# Patient Record
Sex: Male | Born: 1960 | Race: Black or African American | Hispanic: No | Marital: Married | Smoking: Former smoker
Health system: Southern US, Community
[De-identification: ages and names within clinical notes are randomized; demographics above are authoritative.]

## PROBLEM LIST (undated history)

## (undated) DIAGNOSIS — E785 Hyperlipidemia, unspecified: Secondary | ICD-10-CM

## (undated) DIAGNOSIS — K259 Gastric ulcer, unspecified as acute or chronic, without hemorrhage or perforation: Secondary | ICD-10-CM

## (undated) DIAGNOSIS — E669 Obesity, unspecified: Secondary | ICD-10-CM

## (undated) DIAGNOSIS — I739 Peripheral vascular disease, unspecified: Secondary | ICD-10-CM

## (undated) DIAGNOSIS — K7460 Unspecified cirrhosis of liver: Secondary | ICD-10-CM

## (undated) DIAGNOSIS — B353 Tinea pedis: Secondary | ICD-10-CM

## (undated) DIAGNOSIS — R188 Other ascites: Secondary | ICD-10-CM

## (undated) DIAGNOSIS — I10 Essential (primary) hypertension: Secondary | ICD-10-CM

## (undated) DIAGNOSIS — Z86010 Personal history of colonic polyps: Secondary | ICD-10-CM

## (undated) HISTORY — PX: POLYPECTOMY: SHX149

## (undated) HISTORY — DX: Obesity, unspecified: E66.9

## (undated) HISTORY — DX: Personal history of colonic polyps: Z86.010

## (undated) HISTORY — DX: Unspecified cirrhosis of liver: K74.60

## (undated) HISTORY — DX: Peripheral vascular disease, unspecified: I73.9

## (undated) HISTORY — DX: Tinea pedis: B35.3

## (undated) HISTORY — DX: Hyperlipidemia, unspecified: E78.5

## (undated) HISTORY — PX: COLONOSCOPY: SHX174

## (undated) HISTORY — DX: Other ascites: R18.8

## (undated) HISTORY — DX: Essential (primary) hypertension: I10

---
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
## 1997-01-18 HISTORY — PX: UMBILICAL HERNIA REPAIR: SHX196

## 2001-07-29 ENCOUNTER — Encounter: Payer: Self-pay | Admitting: Family Medicine

## 2001-07-29 ENCOUNTER — Encounter: Admission: RE | Admit: 2001-07-29 | Discharge: 2001-07-29 | Payer: Self-pay | Admitting: Family Medicine

## 2002-07-19 HISTORY — PX: VASECTOMY: SHX75

## 2004-05-24 ENCOUNTER — Ambulatory Visit: Payer: Self-pay | Admitting: Family Medicine

## 2004-06-06 ENCOUNTER — Ambulatory Visit: Payer: Self-pay | Admitting: Family Medicine

## 2005-06-05 ENCOUNTER — Ambulatory Visit: Payer: Self-pay | Admitting: Family Medicine

## 2005-06-20 HISTORY — PX: OTHER SURGICAL HISTORY: SHX169

## 2006-01-22 ENCOUNTER — Ambulatory Visit: Payer: Self-pay | Admitting: Family Medicine

## 2006-02-03 ENCOUNTER — Encounter: Admission: RE | Admit: 2006-02-03 | Discharge: 2006-02-03 | Payer: Self-pay | Admitting: Family Medicine

## 2006-02-10 ENCOUNTER — Ambulatory Visit: Payer: Self-pay | Admitting: Family Medicine

## 2006-02-13 ENCOUNTER — Ambulatory Visit: Payer: Self-pay | Admitting: Family Medicine

## 2006-03-19 ENCOUNTER — Encounter: Admission: RE | Admit: 2006-03-19 | Discharge: 2006-06-17 | Payer: Self-pay | Admitting: Family Medicine

## 2006-03-24 ENCOUNTER — Ambulatory Visit: Payer: Self-pay | Admitting: Family Medicine

## 2006-06-26 ENCOUNTER — Ambulatory Visit: Payer: Self-pay | Admitting: Family Medicine

## 2006-06-26 LAB — CONVERTED CEMR LAB
ALT: 52 units/L — ABNORMAL HIGH (ref 0–40)
AST: 39 units/L — ABNORMAL HIGH (ref 0–37)
Direct LDL: 175.5 mg/dL
Hgb A1c MFr Bld: 6.2 %
Hgb A1c MFr Bld: 6.2 % — ABNORMAL HIGH (ref 4.6–6.0)
Triglycerides: 98 mg/dL (ref 0–149)

## 2006-08-13 ENCOUNTER — Encounter: Payer: Self-pay | Admitting: Family Medicine

## 2006-08-13 DIAGNOSIS — E119 Type 2 diabetes mellitus without complications: Secondary | ICD-10-CM | POA: Insufficient documentation

## 2006-08-13 DIAGNOSIS — K760 Fatty (change of) liver, not elsewhere classified: Secondary | ICD-10-CM

## 2006-08-13 DIAGNOSIS — E1169 Type 2 diabetes mellitus with other specified complication: Secondary | ICD-10-CM | POA: Insufficient documentation

## 2006-08-13 DIAGNOSIS — E785 Hyperlipidemia, unspecified: Secondary | ICD-10-CM

## 2006-08-13 DIAGNOSIS — E1165 Type 2 diabetes mellitus with hyperglycemia: Secondary | ICD-10-CM

## 2006-08-13 DIAGNOSIS — Z87891 Personal history of nicotine dependence: Secondary | ICD-10-CM | POA: Insufficient documentation

## 2006-08-13 DIAGNOSIS — T7840XA Allergy, unspecified, initial encounter: Secondary | ICD-10-CM | POA: Insufficient documentation

## 2006-08-19 ENCOUNTER — Ambulatory Visit: Payer: Self-pay | Admitting: Family Medicine

## 2007-07-16 ENCOUNTER — Ambulatory Visit: Payer: Self-pay | Admitting: Family Medicine

## 2007-07-16 DIAGNOSIS — B353 Tinea pedis: Secondary | ICD-10-CM | POA: Insufficient documentation

## 2007-09-08 ENCOUNTER — Ambulatory Visit: Payer: Self-pay | Admitting: Family Medicine

## 2007-09-08 DIAGNOSIS — E663 Overweight: Secondary | ICD-10-CM

## 2007-09-09 LAB — CONVERTED CEMR LAB
AST: 29 units/L (ref 0–37)
Albumin: 4.3 g/dL (ref 3.5–5.2)
Basophils Absolute: 0 10*3/uL (ref 0.0–0.1)
Basophils Relative: 0.1 % (ref 0.0–1.0)
CO2: 29 meq/L (ref 19–32)
Calcium: 9.6 mg/dL (ref 8.4–10.5)
Cholesterol: 240 mg/dL (ref 0–200)
Direct LDL: 162.2 mg/dL
GFR calc non Af Amer: 85 mL/min
Glucose, Bld: 97 mg/dL (ref 70–99)
HCT: 47 % (ref 39.0–52.0)
Hemoglobin: 16 g/dL (ref 13.0–17.0)
Hgb A1c MFr Bld: 6.4 % — ABNORMAL HIGH (ref 4.6–6.0)
Lymphocytes Relative: 41.6 % (ref 12.0–46.0)
MCHC: 34.1 g/dL (ref 30.0–36.0)
MCV: 99.1 fL (ref 78.0–100.0)
Monocytes Absolute: 0.5 10*3/uL (ref 0.1–1.0)
Neutro Abs: 2.5 10*3/uL (ref 1.4–7.7)
Phosphorus: 4.1 mg/dL (ref 2.3–4.6)
Potassium: 4.3 meq/L (ref 3.5–5.1)
RBC: 4.74 M/uL (ref 4.22–5.81)
RDW: 13.3 % (ref 11.5–14.6)
Sodium: 141 meq/L (ref 135–145)
Total Bilirubin: 0.6 mg/dL (ref 0.3–1.2)

## 2007-10-21 ENCOUNTER — Ambulatory Visit: Payer: Self-pay | Admitting: Family Medicine

## 2007-11-17 ENCOUNTER — Encounter: Admission: RE | Admit: 2007-11-17 | Discharge: 2007-11-17 | Payer: Self-pay | Admitting: Family Medicine

## 2007-11-17 ENCOUNTER — Encounter (INDEPENDENT_AMBULATORY_CARE_PROVIDER_SITE_OTHER): Payer: Self-pay | Admitting: Family Medicine

## 2007-12-02 ENCOUNTER — Ambulatory Visit: Payer: Self-pay | Admitting: Family Medicine

## 2007-12-09 LAB — CONVERTED CEMR LAB
ALT: 47 units/L (ref 0–53)
AST: 36 units/L (ref 0–37)
Triglycerides: 114 mg/dL (ref 0–149)

## 2008-01-21 ENCOUNTER — Ambulatory Visit: Payer: Self-pay | Admitting: Family Medicine

## 2008-01-23 LAB — CONVERTED CEMR LAB
ALT: 52 units/L (ref 0–53)
BUN: 12 mg/dL (ref 6–23)
Bilirubin, Direct: 0.1 mg/dL (ref 0.0–0.3)
CO2: 28 meq/L (ref 19–32)
Chloride: 108 meq/L (ref 96–112)
Glucose, Bld: 124 mg/dL — ABNORMAL HIGH (ref 70–99)
Hgb A1c MFr Bld: 6.2 % — ABNORMAL HIGH (ref 4.6–6.0)
LDL Cholesterol: 107 mg/dL — ABNORMAL HIGH (ref 0–99)
Potassium: 4.1 meq/L (ref 3.5–5.1)
Total Bilirubin: 0.6 mg/dL (ref 0.3–1.2)
Total CHOL/HDL Ratio: 4.3
VLDL: 22 mg/dL (ref 0–40)

## 2008-02-01 ENCOUNTER — Emergency Department (HOSPITAL_COMMUNITY): Admission: EM | Admit: 2008-02-01 | Discharge: 2008-02-01 | Payer: Self-pay | Admitting: *Deleted

## 2008-02-03 ENCOUNTER — Ambulatory Visit: Payer: Self-pay | Admitting: Family Medicine

## 2008-02-10 ENCOUNTER — Ambulatory Visit: Payer: Self-pay

## 2008-02-10 ENCOUNTER — Encounter: Payer: Self-pay | Admitting: Family Medicine

## 2008-02-23 ENCOUNTER — Ambulatory Visit: Payer: Self-pay | Admitting: Internal Medicine

## 2008-04-05 ENCOUNTER — Encounter (INDEPENDENT_AMBULATORY_CARE_PROVIDER_SITE_OTHER): Payer: Self-pay | Admitting: *Deleted

## 2008-04-29 ENCOUNTER — Ambulatory Visit: Payer: Self-pay | Admitting: Family Medicine

## 2008-05-26 ENCOUNTER — Encounter (INDEPENDENT_AMBULATORY_CARE_PROVIDER_SITE_OTHER): Payer: Self-pay | Admitting: *Deleted

## 2008-06-10 ENCOUNTER — Ambulatory Visit: Payer: Self-pay | Admitting: Family Medicine

## 2008-06-13 LAB — CONVERTED CEMR LAB
Alkaline Phosphatase: 91 units/L (ref 39–117)
Basophils Absolute: 0 10*3/uL (ref 0.0–0.1)
Bilirubin, Direct: 0.1 mg/dL (ref 0.0–0.3)
CO2: 29 meq/L (ref 19–32)
Calcium: 9.6 mg/dL (ref 8.4–10.5)
Cholesterol: 235 mg/dL (ref 0–200)
Direct LDL: 173.8 mg/dL
GFR calc Af Amer: 116 mL/min
Glucose, Bld: 120 mg/dL — ABNORMAL HIGH (ref 70–99)
Lymphocytes Relative: 34.9 % (ref 12.0–46.0)
MCHC: 35.3 g/dL (ref 30.0–36.0)
Monocytes Absolute: 0.7 10*3/uL (ref 0.1–1.0)
Monocytes Relative: 11.7 % (ref 3.0–12.0)
PSA: 0.41 ng/mL (ref 0.10–4.00)
Platelets: 228 10*3/uL (ref 150–400)
Potassium: 4.5 meq/L (ref 3.5–5.1)
RDW: 13.3 % (ref 11.5–14.6)
Sodium: 140 meq/L (ref 135–145)
Total Bilirubin: 0.8 mg/dL (ref 0.3–1.2)
Total CHOL/HDL Ratio: 5.1
Total Protein: 7.3 g/dL (ref 6.0–8.3)
Triglycerides: 84 mg/dL (ref 0–149)
VLDL: 17 mg/dL (ref 0–40)

## 2008-06-16 ENCOUNTER — Ambulatory Visit: Payer: Self-pay | Admitting: Family Medicine

## 2008-06-23 ENCOUNTER — Encounter: Payer: Self-pay | Admitting: Family Medicine

## 2008-08-11 ENCOUNTER — Encounter (INDEPENDENT_AMBULATORY_CARE_PROVIDER_SITE_OTHER): Payer: Self-pay | Admitting: *Deleted

## 2008-09-13 ENCOUNTER — Ambulatory Visit: Payer: Self-pay | Admitting: Family Medicine

## 2008-09-13 DIAGNOSIS — IMO0002 Reserved for concepts with insufficient information to code with codable children: Secondary | ICD-10-CM

## 2008-09-26 ENCOUNTER — Encounter (INDEPENDENT_AMBULATORY_CARE_PROVIDER_SITE_OTHER): Payer: Self-pay | Admitting: *Deleted

## 2008-09-26 LAB — CONVERTED CEMR LAB
ALT: 61 units/L — ABNORMAL HIGH (ref 0–53)
Cholesterol: 253 mg/dL — ABNORMAL HIGH (ref 0–200)
HDL: 47.2 mg/dL (ref >39.00)
Triglycerides: 126 mg/dL (ref 0.0–149.0)

## 2009-08-07 ENCOUNTER — Telehealth: Payer: Self-pay | Admitting: Family Medicine

## 2009-08-08 ENCOUNTER — Ambulatory Visit: Payer: Self-pay | Admitting: Family Medicine

## 2009-08-08 LAB — CONVERTED CEMR LAB
Cholesterol, target level: 200 mg/dL
HDL goal, serum: 40 mg/dL
LDL Goal: 100 mg/dL

## 2009-09-19 ENCOUNTER — Ambulatory Visit: Payer: Self-pay | Admitting: Family Medicine

## 2009-09-19 LAB — CONVERTED CEMR LAB
Albumin: 4.3 g/dL (ref 3.5–5.2)
Basophils Absolute: 0.1 10*3/uL (ref 0.0–0.1)
CO2: 26 meq/L (ref 19–32)
Calcium: 9.6 mg/dL (ref 8.4–10.5)
Chloride: 105 meq/L (ref 96–112)
Cholesterol: 183 mg/dL (ref 0–200)
Creatinine,U: 35.7 mg/dL
Eosinophils Relative: 7.5 % — ABNORMAL HIGH (ref 0.0–5.0)
Glucose, Bld: 142 mg/dL — ABNORMAL HIGH (ref 70–99)
HCT: 42.5 % (ref 39.0–52.0)
HDL: 42.2 mg/dL (ref >39.00)
Hgb A1c MFr Bld: 7.5 % — ABNORMAL HIGH (ref 4.6–6.5)
Lymphs Abs: 2.1 10*3/uL (ref 0.7–4.0)
Microalb, Ur: 5.9 mg/dL — ABNORMAL HIGH (ref 0.0–1.9)
Monocytes Absolute: 0.7 10*3/uL (ref 0.1–1.0)
Monocytes Relative: 10.9 % (ref 3.0–12.0)
Neutrophils Relative %: 50 % (ref 43.0–77.0)
Platelets: 278 10*3/uL (ref 150.0–400.0)
Potassium: 4.4 meq/L (ref 3.5–5.1)
RDW: 14 % (ref 11.5–14.6)
Sodium: 143 meq/L (ref 135–145)
TSH: 2.22 microintl units/mL (ref 0.35–5.50)
Total CHOL/HDL Ratio: 4
Triglycerides: 95 mg/dL (ref 0.0–149.0)
WBC: 6.7 10*3/uL (ref 4.5–10.5)

## 2009-09-21 ENCOUNTER — Ambulatory Visit: Payer: Self-pay | Admitting: Family Medicine

## 2009-09-21 ENCOUNTER — Encounter (INDEPENDENT_AMBULATORY_CARE_PROVIDER_SITE_OTHER): Payer: Self-pay | Admitting: *Deleted

## 2009-11-15 ENCOUNTER — Encounter: Payer: Self-pay | Admitting: Family Medicine

## 2009-11-15 ENCOUNTER — Encounter: Admission: RE | Admit: 2009-11-15 | Discharge: 2010-02-13 | Payer: Self-pay | Admitting: Family Medicine

## 2009-12-28 ENCOUNTER — Encounter: Payer: Self-pay | Admitting: Family Medicine

## 2010-01-02 ENCOUNTER — Ambulatory Visit: Payer: Self-pay | Admitting: Family Medicine

## 2010-01-02 ENCOUNTER — Telehealth (INDEPENDENT_AMBULATORY_CARE_PROVIDER_SITE_OTHER): Payer: Self-pay | Admitting: *Deleted

## 2010-01-04 LAB — CONVERTED CEMR LAB
AST: 48 units/L — ABNORMAL HIGH (ref 0–37)
CO2: 27 meq/L (ref 19–32)
Cholesterol: 151 mg/dL (ref 0–200)
Creatinine, Ser: 0.9 mg/dL (ref 0.4–1.5)
GFR calc non Af Amer: 119.28 mL/min (ref >60)
Glucose, Bld: 112 mg/dL — ABNORMAL HIGH (ref 70–99)
Hgb A1c MFr Bld: 6.6 % — ABNORMAL HIGH (ref 4.6–6.5)
Potassium: 4.4 meq/L (ref 3.5–5.1)
Sodium: 139 meq/L (ref 135–145)
Triglycerides: 73 mg/dL (ref 0.0–149.0)

## 2010-01-05 ENCOUNTER — Ambulatory Visit: Payer: Self-pay | Admitting: Family Medicine

## 2010-01-05 DIAGNOSIS — I10 Essential (primary) hypertension: Secondary | ICD-10-CM | POA: Insufficient documentation

## 2010-01-30 ENCOUNTER — Encounter: Payer: Self-pay | Admitting: Family Medicine

## 2010-05-22 ENCOUNTER — Ambulatory Visit: Admit: 2010-05-22 | Payer: Self-pay | Admitting: Family Medicine

## 2010-05-23 ENCOUNTER — Encounter (INDEPENDENT_AMBULATORY_CARE_PROVIDER_SITE_OTHER): Payer: Self-pay | Admitting: *Deleted

## 2010-05-28 ENCOUNTER — Ambulatory Visit: Admit: 2010-05-28 | Payer: Self-pay | Admitting: Family Medicine

## 2010-05-29 ENCOUNTER — Encounter (INDEPENDENT_AMBULATORY_CARE_PROVIDER_SITE_OTHER): Payer: Self-pay | Admitting: *Deleted

## 2010-05-30 ENCOUNTER — Ambulatory Visit
Admission: RE | Admit: 2010-05-30 | Discharge: 2010-05-30 | Payer: Self-pay | Source: Home / Self Care | Attending: Family Medicine | Admitting: Family Medicine

## 2010-05-30 ENCOUNTER — Other Ambulatory Visit: Payer: Self-pay | Admitting: Family Medicine

## 2010-05-30 LAB — LIPID PANEL
Cholesterol: 260 mg/dL — ABNORMAL HIGH (ref 0–200)
HDL: 60.5 mg/dL (ref >39.00)
Total CHOL/HDL Ratio: 4
Triglycerides: 198 mg/dL — ABNORMAL HIGH (ref 0.0–149.0)
VLDL: 39.6 mg/dL (ref 0.0–40.0)

## 2010-05-30 LAB — RENAL FUNCTION PANEL
Albumin: 4.1 g/dL (ref 3.5–5.2)
BUN: 12 mg/dL (ref 6–23)
CO2: 29 mEq/L (ref 19–32)
Calcium: 9.6 mg/dL (ref 8.4–10.5)
Chloride: 103 mEq/L (ref 96–112)
Creatinine, Ser: 1 mg/dL (ref 0.4–1.5)
GFR: 99.12 mL/min (ref >60.00)
Glucose, Bld: 88 mg/dL (ref 70–99)
Phosphorus: 3.6 mg/dL (ref 2.3–4.6)
Potassium: 4.3 mEq/L (ref 3.5–5.1)
Sodium: 141 mEq/L (ref 135–145)

## 2010-05-30 LAB — AST: AST: 34 U/L (ref 0–37)

## 2010-05-30 LAB — ALT: ALT: 47 U/L (ref 0–53)

## 2010-05-30 LAB — HEMOGLOBIN A1C: Hgb A1c MFr Bld: 7.2 % — ABNORMAL HIGH (ref 4.6–6.5)

## 2010-05-30 LAB — LDL CHOLESTEROL, DIRECT: Direct LDL: 180.6 mg/dL

## 2010-06-19 NOTE — Progress Notes (Signed)
----   Converted from flag ---- ---- 01/01/2010 8:31 PM, Allena Earing MD wrote: please check AIC / renal / lipids/ast/alt 250.0 and 272- thanks   ---- 01/01/2010 8:12 AM, Daralene Milch CMA (AAMA) wrote: Kermit Balo Morning! This pt is scheduled for fasting labs tomorrow, which labs to draw and dx codes to use? Thanks Tasha ------------------------------

## 2010-06-19 NOTE — Assessment & Plan Note (Signed)
Summary: 3 month follwo up/rbh   Vital Signs:  Patient profile:   50 year old male Height:      67 inches Weight:      222 pounds BMI:     34.90 Temp:     97.8 degrees F oral Pulse rate:   84 / minute Pulse rhythm:   regular BP sitting:   128 / 88  (left arm) Cuff size:   large  Vitals Entered By: Ozzie Hoyle LPN (January 05, 7845 8:44 AM) CC: three month f/u after labs   Chief Complaint:  three month f/u after labs.  History of Present Illness: here to f/u for lipids and DM and fatty liver and HTN  has changed some lifestyle habits -- much more serious about it  100 sit ups daily and treadmill 4 times per week -- up to 37 minutes  feels better   has changed his diet -- staying better with carbs 45, 60, 60 -- really watching   wt is down 7 lb  bp is 128/88    sugar 112 with AIC 6.6 down from 7.5-- a great change   ast alt are up 74/ and 48-- higher with 40 of zocor   cholesterol did improve  with LDL 100 from 122  had eye exam august -- nl, Dr Teressa Senter   Allergies (verified): No Known Drug Allergies  Past History:  Past Medical History: Last updated: 08/08/2009 Diabetes mellitus, type II Hyperlipidemia obesity HTN  chronic tinea pedis  obesity  Past Surgical History: Last updated: 02/13/2008 Vasectomy (07/2002) Hernia repair- umbilical (96/2952) Myringectomy (06/2005) 9/09 abnormal nuclear stress test   Family History: Last updated: 07/16/2007 no family hx of DM or heart disease  Social History: Last updated: 04/29/2008 Patient is a former smoker. --quit 12/08 started back very seldom -- about 4 cig per week  married works at [employer]  started exercise 8/09  Risk Factors: Smoking Status: quit (08/13/2006)  Review of Systems General:  Denies fatigue, loss of appetite, and malaise. Eyes:  Denies blurring and eye irritation. CV:  Denies chest pain or discomfort, lightheadness, palpitations, and shortness of breath with exertion. Resp:   Denies cough and shortness of breath. GI:  Denies indigestion and nausea. GU:  Denies urinary frequency. MS:  Denies muscle aches. Derm:  Denies itching, lesion(s), poor wound healing, and rash. Neuro:  Denies numbness and tingling. Endo:  Denies cold intolerance, excessive thirst, excessive urination, and heat intolerance. Heme:  Denies abnormal bruising and bleeding.  Physical Exam  General:  overweight but generally well appearing  Head:  normocephalic, atraumatic, and no abnormalities observed.   Eyes:  vision grossly intact, pupils equal, pupils round, and pupils reactive to light.  no conjunctival pallor, injection or icterus  Neck:  supple with full rom and no masses or thyromegally, no JVD or carotid bruit  Lungs:  Normal respiratory effort, chest expands symmetrically. Lungs are clear to auscultation, no crackles or wheezes. Heart:  Normal rate and regular rhythm. S1 and S2 normal without gallop, murmur, click, rub or other extra sounds. Abdomen:  Bowel sounds positive,abdomen soft and non-tender without masses, organomegaly or hernias noted. no renal bruits  Msk:  No deformity or scoliosis noted of thoracic or lumbar spine.  no acute joint changes Pulses:  R and L carotid,radial,femoral,dorsalis pedis and posterior tibial pulses are full and equal bilaterally Extremities:  No clubbing, cyanosis, edema, or deformity noted with normal full range of motion of all joints.   Neurologic:  sensation intact to light touch, gait normal, and DTRs symmetrical and normal.   Skin:  foot fungus has imp- much less skin thickening and scale  toenails are thick Cervical Nodes:  No lymphadenopathy noted Psych:  normal affect, talkative and pleasant   Diabetes Management Exam:    Foot Exam (with socks and/or shoes not present):       Sensory-Pinprick/Light touch:          Left medial foot (L-4): normal          Left dorsal foot (L-5): normal          Left lateral foot (S-1): normal           Right medial foot (L-4): normal          Right dorsal foot (L-5): normal          Right lateral foot (S-1): normal       Sensory-Monofilament:          Left foot: normal          Right foot: normal       Inspection:          Left foot: normal          Right foot: normal       Nails:          Left foot: thickened          Right foot: thickened   Impression & Recommendations:  Problem # 1:  OBESITY (ICD-278.00) Assessment Improved wt improved by 7 lb (per pt 13) urged to keep up the good work  Problem # 2:  HYPERLIPIDEMIA (ICD-272.4) Assessment: Improved  this is improved but due to inc in ast/alt will drop back down to 20 of zocor  keep up good diet low sat fat diet reviewed  lab and f/u 3 mo  His updated medication list for this problem includes:    Zocor 40 Mg Tabs (Simvastatin) .Marland Kitchen... 1/2  by mouth once daily  Labs Reviewed: SGOT: 48 (01/02/2010)   SGPT: 74 (01/02/2010)  Lipid Goals: Chol Goal: 200 (08/08/2009)   HDL Goal: 40 (08/08/2009)   LDL Goal: 100 (08/08/2009)   TG Goal: 150 (08/08/2009)  Prior 10 Yr Risk Heart Disease: Not enough information (08/08/2009)   HDL:36.20 (01/02/2010), 42.20 (09/19/2009)  LDL:100 (01/02/2010), 122 (09/19/2009)  Chol:151 (01/02/2010), 183 (09/19/2009)  Trig:73.0 (01/02/2010), 95.0 (09/19/2009)  Problem # 3:  DIABETES MELLITUS, TYPE II (ICD-250.00) Assessment: Improved  this is really improving with diet and exercise and wt loss rev low carb diet urged to keep it up  f/u 3 mo/ lab His updated medication list for this problem includes:    Zestoretic 10-12.5 Mg Tabs (Lisinopril-hydrochlorothiazide) .Marland Kitchen... 1 by mouth once daily  Labs Reviewed: Creat: 0.9 (01/02/2010)    Reviewed HgBA1c results: 6.6 (01/02/2010)  7.5 (09/19/2009)  Problem # 4:  HYPERTENSION, BENIGN ESSENTIAL (ICD-401.1) Assessment: Unchanged  well controlled with zestoretic and exercise f/u 3 mo  rev lab with pt today His updated medication list for this  problem includes:    Zestoretic 10-12.5 Mg Tabs (Lisinopril-hydrochlorothiazide) .Marland Kitchen... 1 by mouth once daily  BP today: 128/88 Prior BP: 132/92 (09/21/2009)  Prior 10 Yr Risk Heart Disease: Not enough information (08/08/2009)  Labs Reviewed: K+: 4.4 (01/02/2010) Creat: : 0.9 (01/02/2010)   Chol: 151 (01/02/2010)   HDL: 36.20 (01/02/2010)   LDL: 100 (01/02/2010)   TG: 73.0 (01/02/2010)  Complete Medication List: 1)  Zocor 40 Mg Tabs (Simvastatin) .... 1/2  by  mouth once daily 2)  Blood Glucose Meter Kit (Blood glucose monitoring suppl) .... Check glucose once daily as needed dm: 250.0 3)  Lancets Misc (Lancets) .... Patient test once daily as needed dx: 250.00 4)  Zestoretic 10-12.5 Mg Tabs (Lisinopril-hydrochlorothiazide) .Marland Kitchen.. 1 by mouth once daily  Patient Instructions: 1)  labs are looking better 2)  keep working on weight loss and healthy diet and exercise  3)  decrase zocor to 20 mg daily (that is 1/2 of 40 mg pill)  4)  eat low sugar and low fat 5)  keep up the exercise 6)  schedule fasting labs in 3 months and then follow up lipid/ast/alt/AIC/renal 401.1, 272, 250.0  Current Allergies (reviewed today): No known allergies

## 2010-06-19 NOTE — Assessment & Plan Note (Signed)
Summary: 6WK F/U AFTER LABS / LFW   Vital Signs:  Patient profile:   50 year old male Height:      67 inches Weight:      229.75 pounds BMI:     36.11 Temp:     97.8 degrees F oral Pulse rate:   80 / minute Pulse rhythm:   regular BP sitting:   132 / 92  (left arm) Cuff size:   large  Vitals Entered By: Ozzie Hoyle LPN (Sep 21, 7780 42:35 AM) CC: six week f/u after labs   History of Present Illness: here to f/u for HTN and DM and lipids  bp is 132/92 today- that is imp with med  did not take his med this am    wt is stable   lipids trig 95/ HDL 42/ and LDL 122 on statin -- much imp with prev LDL 178  AIC however is up from 6.3 to 7.5   is exercing more -- 4-5 times per week on treadmill- really proud of that  lost 7 lb by our scale  is eating fair -- low cholesterol  does not know what a DM diet  did not sugar like he was supposed   wants to get set up for his first screening colonosc  no fam hx or bowel change       Allergies (verified): No Known Drug Allergies  Past History:  Past Medical History: Last updated: 08/08/2009 Diabetes mellitus, type II Hyperlipidemia obesity HTN  chronic tinea pedis  obesity  Past Surgical History: Last updated: 02/13/2008 Vasectomy (07/2002) Hernia repair- umbilical (36/1443) Myringectomy (06/2005) 9/09 abnormal nuclear stress test   Family History: Last updated: 07/16/2007 no family hx of DM or heart disease  Social History: Last updated: 04/29/2008 Patient is a former smoker. --quit 12/08 started back very seldom -- about 4 cig per week  married works at [employer]  started exercise 8/09  Risk Factors: Smoking Status: quit (08/13/2006)  Review of Systems General:  Denies fatigue, loss of appetite, and malaise. Eyes:  Denies blurring and eye irritation. CV:  Denies chest pain or discomfort, lightheadness, palpitations, and shortness of breath with exertion. Resp:  Denies cough, shortness of  breath, and wheezing. GI:  Denies abdominal pain, change in bowel habits, indigestion, and nausea. GU:  Denies urinary frequency. MS:  Denies muscle aches and cramps. Derm:  Denies lesion(s), poor wound healing, and rash. Neuro:  Denies headaches, numbness, and tingling. Psych:  Denies anxiety and depression. Endo:  Denies cold intolerance, excessive thirst, excessive urination, and heat intolerance. Heme:  Denies abnormal bruising and bleeding.  Physical Exam  General:  overweight but generally well appearing  Head:  normocephalic, atraumatic, and no abnormalities observed.   Eyes:  vision grossly intact, pupils equal, pupils round, and pupils reactive to light.  no conjunctival pallor, injection or icterus  Mouth:  pharynx pink and moist.   Neck:  supple with full rom and no masses or thyromegally, no JVD or carotid bruit  Chest Wall:  No deformities, masses, tenderness or gynecomastia noted. Lungs:  Normal respiratory effort, chest expands symmetrically. Lungs are clear to auscultation, no crackles or wheezes. Heart:  Normal rate and regular rhythm. S1 and S2 normal without gallop, murmur, click, rub or other extra sounds. Abdomen:  Bowel sounds positive,abdomen soft and non-tender without masses, organomegaly or hernias noted. no renal bruits  Msk:  No deformity or scoliosis noted of thoracic or lumbar spine.  no acute joint changes  Pulses:  R and L carotid,radial,femoral,dorsalis pedis and posterior tibial pulses are full and equal bilaterally Extremities:  No clubbing, cyanosis, edema, or deformity noted with normal full range of motion of all joints.   Neurologic:  sensation intact to light touch, gait normal, and DTRs symmetrical and normal.   Skin:  foot fungus has imp- much less skin thickening and scale  toenails are thick Cervical Nodes:  No lymphadenopathy noted Psych:  normal affect, talkative and pleasant   Diabetes Management Exam:    Foot Exam (with socks and/or shoes  not present):       Sensory-Pinprick/Light touch:          Left medial foot (L-4): normal          Left dorsal foot (L-5): normal          Left lateral foot (S-1): normal          Right medial foot (L-4): normal          Right dorsal foot (L-5): normal          Right lateral foot (S-1): normal       Sensory-Monofilament:          Left foot: normal          Right foot: normal       Inspection:          Left foot: normal          Right foot: normal       Nails:          Left foot: fungal infection          Right foot: fungal infection   Impression & Recommendations:  Problem # 1:  SCREENING, COLON CANCER (ICD-V76.51) Assessment New schedule screening colonosc for 50 year old without symptoms or fam hx Orders: Gastroenterology Referral (GI) Prescription Created Electronically [phone])  Problem # 2:  TINEA PEDIS (ICD-110.4) Assessment: Improved  this has improved with treatment enc pt to continue good foot care  Orders: Prescription Created Electronically [phone])  Problem # 3:  HYPERLIPIDEMIA (ICD-272.4) Assessment: Improved  cholesterol imp with zocor but not at goal low fat diet is good - that was reviewed  inc to 40  re check 3 mo update if side eff or problems His updated medication list for this problem includes:    Zocor 40 Mg Tabs (Simvastatin) .Marland Kitchen... 1 by mouth once daily  Labs Reviewed: SGOT: 9 (09/19/2009)   SGPT: 4 (09/19/2009)  Lipid Goals: Chol Goal: 200 (08/08/2009)   HDL Goal: 40 (08/08/2009)   LDL Goal: 100 (08/08/2009)   TG Goal: 150 (08/08/2009)  Prior 10 Yr Risk Heart Disease: Not enough information (08/08/2009)   HDL:42.20 (09/19/2009), 47.20 (09/13/2008)  LDL:122 (09/19/2009), DEL (06/10/2008)  Chol:183 (09/19/2009), 253 (09/13/2008)  Trig:95.0 (09/19/2009), 126.0 (09/13/2008)  Orders: Prescription Created Electronically [phone])  Problem # 4:  DIABETES MELLITUS, TYPE II (ICD-250.00) Assessment: Deteriorated worse- do not know why pt has  poor understanding of DM diet  disc healthy diet (low simple sugar/ choose complex carbs/ low sat fat) diet and exercise in detail  keep up great exercise ref to DM teach and check once daily  f/u after lab in 3 mo  if not imp- will start metformin His updated medication list for this problem includes:    Zestoretic 10-12.5 Mg Tabs (Lisinopril-hydrochlorothiazide) .Marland Kitchen... 1 by mouth once daily  Orders: Diabetic Clinic Referral (Diabetic) Prescription Created Electronically [phone])  Complete Medication List: 1)  Zocor 40 Mg Tabs (  Simvastatin) .Marland Kitchen.. 1 by mouth once daily 2)  Blood Glucose Meter Kit (Blood glucose monitoring suppl) .... Check glucose once daily as needed dm: 250.0 3)  Lancets Misc (Lancets) .... Patient test once daily as needed dx: 250.00 4)  Zestoretic 10-12.5 Mg Tabs (Lisinopril-hydrochlorothiazide) .Marland Kitchen.. 1 by mouth once daily  Patient Instructions: 1)  keep working on diet and exercise 2)  we will set you up for diabetic teaching at check out  3)  we will set you up for colonoscopy at check out  4)  increase your zocor/ simvastatin to 40 mg once daily - I will send it to your pharmacy  5)  schedule nurse visit one day when you have taken you medicine to check your blood sugar  6)  schedule fasting labs in 3 months and follow up  Prescriptions: ZESTORETIC 10-12.5 MG TABS (LISINOPRIL-HYDROCHLOROTHIAZIDE) 1 by mouth once daily  #30 x 11   Entered and Authorized by:   Allena Earing MD   Signed by:   Allena Earing MD on 09/21/2009   Method used:   Electronically to        [facility]. #[id number]* (retail)       [place], [place]  [address]       Ph: [phone]       Fax: [phone]   RxID:   [id number] ZOCOR 40 MG TABS (SIMVASTATIN) 1 by mouth once daily  #30 x 11   Entered and Authorized by:   Allena Earing MD   Signed by:   Allena Earing MD on 09/21/2009   Method used:   Electronically to        [facility]  #[id number]* (retail)       [place], [place]  [address]       Ph: [phone]       Fax: [phone]   RxID:   [id number]   Current Allergies (reviewed today): No known allergies

## 2010-06-19 NOTE — Letter (Signed)
Summary: Previsit letter  [facility]  [place], [place] [address]   Phone: [phone]  Fax: [phone]       09/21/2009 MRN: [id number]  Zachary Lewis [address] [place], [place]  [address]  Dear Zachary Lewis,  Welcome to the Gastroenterology Division at [facility].    You are scheduled to see a nurse for your pre-procedure visit on 10-30-09 at 4:30pm on the 3rd floor at [facility], [place] [employer].  We ask that you try to arrive at our office 15 minutes prior to your appointment time to allow for check-in.  Your nurse visit will consist of discussing your medical and surgical history, your immediate family medical history, and your medications.    Please bring a complete list of all your medications or, if you prefer, bring the medication bottles and we will list them.  We will need to be aware of both prescribed and over the counter drugs.  We will need to know exact dosage information as well.  If you are on blood thinners (Coumadin, Plavix, Aggrenox, Ticlid, etc.) please call our office today/prior to your appointment, as we need to consult with your physician about holding your medication.   Please be prepared to read and sign documents such as consent forms, a financial agreement, and acknowledgement forms.  If necessary, and with your consent, a friend or relative is welcome to sit-in on the nurse visit with you.  Please bring your insurance card so that we may make a copy of it.  If your insurance requires a referral to see a specialist, please bring your referral form from your primary care physician.  No co-pay is required for this nurse visit.     If you cannot keep your appointment, please call [phone] to cancel or reschedule prior to your appointment date.  This allows [place] the opportunity to schedule an appointment for another patient in need of care.    Thank you for choosing [facility] for your medical needs.   We appreciate the opportunity to care for you.  Please visit [place] at our website  to learn more about our practice.                     Sincerely.                                                                                                                   The Gastroenterology Division

## 2010-06-19 NOTE — Assessment & Plan Note (Signed)
Summary: FU DIABETES/MK   Vital Signs:  Patient profile:   50 year old male Height:      67 inches Weight:      230.75 pounds BMI:     36.27 Temp:     98.7 degrees F oral Pulse rate:   72 / minute Pulse rhythm:   regular BP sitting:   142 / 100  (left arm) Cuff size:   large  Vitals Entered By: Ozzie Hoyle LPN (August 09, 1091 2:35 AM) CC: Followup diabetes and BP, Lipid Management   History of Present Illness: here  to f/u for DM and HTN and obesity and chol  has not been compliant with meds and f/u  wt is up 15 lb-- quit smoking- happy about that  has been working out -- walk on treadmill 30 min 4 days per week oatmeal /cheerios in ams -- breakfasts are better  for lunch [place] sandwhiches for lunch  dinner - chicken / fish  some green veg thinks he just eats too much   not checking his sugar - does not know how to use   never did get refils or start hctz or other meds  does not know why  opthy over a year ago -- Dr Teressa Senter    Lipid Management History:      Positive NCEP/ATP III risk factors include male age 37 years old or older, diabetes, and hypertension.  Negative NCEP/ATP III risk factors include non-tobacco-user status.     Allergies (verified): No Known Drug Allergies  Past History:  Past Surgical History: Last updated: 02/13/2008 Vasectomy (07/2002) Hernia repair- umbilical (57/3220) Myringectomy (06/2005) 9/09 abnormal nuclear stress test   Family History: Last updated: 07/16/2007 no family hx of DM or heart disease  Social History: Last updated: 04/29/2008 Patient is a former smoker. --quit 12/08 started back very seldom -- about 4 cig per week  married works at [employer]  started exercise 8/09  Risk Factors: Smoking Status: quit (08/13/2006)  Past Medical History: Diabetes mellitus, type II Hyperlipidemia obesity HTN  chronic tinea pedis  obesity  Review of Systems General:  Denies fatigue, fever, loss of appetite, and  malaise. Eyes:  Denies blurring and eye pain. CV:  Denies chest pain or discomfort, palpitations, shortness of breath with exertion, and swelling of feet. Resp:  Denies cough and shortness of breath. GI:  Denies abdominal pain, bloody stools, change in bowel habits, indigestion, and nausea. GU:  Denies urinary frequency. MS:  Denies muscle aches and muscle weakness. Derm:  Denies lesion(s), poor wound healing, and rash. Neuro:  Denies numbness and tingling. Psych:  Denies anxiety and depression. Endo:  Denies cold intolerance, excessive hunger, excessive thirst, excessive urination, and heat intolerance. Heme:  Denies abnormal bruising and bleeding.  Physical Exam  General:  overweight but generally well appearing  Head:  normocephalic, atraumatic, and no abnormalities observed.   Eyes:  vision grossly intact, pupils equal, pupils round, and pupils reactive to light.  no conjunctival pallor, injection or icterus   Ears:  R ear normal and L ear normal.  - scant cerumen  Mouth:  pharynx pink and moist.   Neck:  supple with full rom and no masses or thyromegally, no JVD or carotid bruit  Chest Wall:  No deformities, masses, tenderness or gynecomastia noted. Lungs:  Normal respiratory effort, chest expands symmetrically. Lungs are clear to auscultation, no crackles or wheezes. Heart:  Normal rate and regular rhythm. S1 and S2 normal without gallop, murmur, click, rub  or other extra sounds. Abdomen:  Bowel sounds positive,abdomen soft and non-tender without masses, organomegaly or hernias noted. no renal bruits  Msk:  No deformity or scoliosis noted of thoracic or lumbar spine.   Pulses:  R and L carotid,radial,femoral,dorsalis pedis and posterior tibial pulses are full and equal bilaterally Extremities:  No clubbing, cyanosis, edema, or deformity noted with normal full range of motion of all joints.   Neurologic:  sensation intact to light touch, gait normal, and DTRs symmetrical and normal.    Skin:  tinea pedis with toenail thickening Cervical Nodes:  No lymphadenopathy noted Inguinal Nodes:  No significant adenopathy Psych:  normal affect, talkative and pleasant    Impression & Recommendations:  Problem # 1:  HYPERTENSION (ICD-401.9) Assessment Deteriorated  pt never started bp med disc dangers of HTN and why to tx disc lifestyle habits and need for wt loss  start zestoretic  lab and f/u in 6 wk update if side eff or problems The following medications were removed from the medication list:    Hydrochlorothiazide 12.5 Mg Tabs (Hydrochlorothiazide) .Marland Kitchen... Take 1 tab  by mouth every morning His updated medication list for this problem includes:    Zestoretic 10-12.5 Mg Tabs (Lisinopril-hydrochlorothiazide) .Marland Kitchen... 1 by mouth once daily  BP today: 142/100 Prior BP: 148/100 (09/13/2008)  10 Yr Risk Heart Disease: Not enough information  Labs Reviewed: K+: 4.5 (06/10/2008) Creat: : 0.9 (06/10/2008)   Chol: 253 (09/13/2008)   HDL: 47.20 (09/13/2008)   LDL: DEL (06/10/2008)   TG: 126.0 (09/13/2008)  Orders: Prescription Created Electronically [phone])  Problem # 2:  TINEA PEDIS (ICD-110.4) Assessment: Unchanged  recommended otc lamasil cream daily stressed imp of tx of this in light of DM and need for good foot care  Orders: Prescription Created Electronically [phone])  Problem # 3:  HYPERLIPIDEMIA (ICD-272.4) Assessment: Deteriorated  pt will get back on statin  rev low sat fat diet lab 6 wk and f/u His updated medication list for this problem includes:    Zocor 20 Mg Tabs (Simvastatin) .Marland Kitchen... 1 by mouth each evening  Labs Reviewed: SGOT: 32 (09/13/2008)   SGPT: 61 (09/13/2008)  Lipid Goals: Chol Goal: 200 (08/08/2009)   HDL Goal: 40 (08/08/2009)   LDL Goal: 100 (08/08/2009)   TG Goal: 150 (08/08/2009)  10 Yr Risk Heart Disease: Not enough information   HDL:47.20 (09/13/2008), 46.4 (06/10/2008)  LDL:DEL (06/10/2008), 107 (01/21/2008)  Chol:253  (09/13/2008), 235 (06/10/2008)  Trig:126.0 (09/13/2008), 84 (06/10/2008)  Orders: Prescription Created Electronically [phone])  Problem # 4:  DIABETES MELLITUS, TYPE II (ICD-250.00) Assessment: Deteriorated  suspect poor control with noncompliance  pt will sched opthy learn how to use meter and check daily DM diet  cut portions for wt loss lab in 6 wk and f/u His updated medication list for this problem includes:    Zestoretic 10-12.5 Mg Tabs (Lisinopril-hydrochlorothiazide) .Marland Kitchen... 1 by mouth once daily  Labs Reviewed: Creat: 0.9 (06/10/2008)    Reviewed HgBA1c results: 6.3 (06/10/2008)  6.2 (01/21/2008)  Orders: Prescription Created Electronically [phone])  Complete Medication List: 1)  Zocor 20 Mg Tabs (Simvastatin) .Marland Kitchen.. 1 by mouth each evening 2)  Blood Glucose Meter Kit (Blood glucose monitoring suppl) .... Check glucose once daily as needed dm: 250.0 3)  Lancets Misc (Lancets) .... Patient test once daily as needed dx: 250.00 4)  Tizanidine Hcl 4 Mg Tabs (Tizanidine hcl) .Marland Kitchen.. 1 by mouth at bedtime 5)  Tramadol Hcl 50 Mg Tabs (Tramadol hcl) .Marland Kitchen.. 1 by mouth 4 times daily  as needed pain 6)  Zestoretic 10-12.5 Mg Tabs (Lisinopril-hydrochlorothiazide) .Marland Kitchen.. 1 by mouth once daily  Lipid Assessment/Plan:      Based on NCEP/ATP III, the patient's risk factor category is "history of diabetes".  The patient's lipid goals are as follows: Total cholesterol goal is 200; LDL cholesterol goal is 100; HDL cholesterol goal is 40; Triglyceride goal is 150.    Patient Instructions: 1)  start back on medicines  2)  start lisinopril hct for high blood pressure - and watch salt in diet  3)  cut portions by 1/3 to 1/2  4)  keep up exercise 5 days per week  5)  schedule fasting labs in 6 weeks and then f/u-- lipid/ast/alt/renal /AIC/ microalb/ tsh / cbc with diff 401.1, 272, 250.0  6)  make sure to schedule your eye doctor appt also  Prescriptions: ZESTORETIC 10-12.5 MG TABS  (LISINOPRIL-HYDROCHLOROTHIAZIDE) 1 by mouth once daily  #30 x 3   Entered and Authorized by:   Allena Earing MD   Signed by:   Allena Earing MD on 08/08/2009   Method used:   Electronically to        [facility]. #[id number]* (retail)       [place], [place]  [address]       Ph: [phone]       Fax: [phone]   RxID:   [id number] ZOCOR 20 MG  TABS (SIMVASTATIN) 1 by mouth each evening  #30 x 3   Entered and Authorized by:   Allena Earing MD   Signed by:   Allena Earing MD on 08/08/2009   Method used:   Electronically to        [facility]. #[id number]* (retail)       [place], [place]  [address]       Ph: [phone]       Fax: [phone]   RxID:   [id number]   Current Allergies (reviewed today): No known allergies

## 2010-06-19 NOTE — Progress Notes (Signed)
Summary: BP elevated  Phone Note Call from Patient Call back at Work Phone [phone]   Caller: Patient Summary of Call: Pt states he was given a script for hctz about a year ago but he never got it filled.  His BP today is 161/104 and he is asking if we can call this in to [facility] at [place] [address].  He has appt to see Dr. Glori Bickers tomorrow morning. Initial call taken by: Marty Heck CMA,  August 07, 2009 4:33 PM  Follow-up for Phone Call        ok to wait to see Dr. Glori Bickers in the morning Follow-up by: Owens Loffler MD,  August 07, 2009 4:40 PM  Additional Follow-up for Phone Call Additional follow up Details #1::        Advised pt. Additional Follow-up by: Marty Heck CMA,  August 07, 2009 4:57 PM

## 2010-06-19 NOTE — Letter (Signed)
Summary: No Show/[facility]  No Show/[facility]   Imported By: Phillis Knack 02/06/2010 14:49:30  _____________________________________________________________________  External Attachment:    Type:   Image     Comment:   External Document  Appended Document: No Show/[facility] please copy and mail this to patient  Appended Document: No Show/[facility] Copy mailed to pt.

## 2010-06-19 NOTE — Letter (Signed)
Summary: Patient No Show/[facility]  Patient No Show/[facility] By: Edmonia James 01/04/2010 08:44:44  _____________________________________________________________________  External Attachment:    Type:   Image     Comment:   External Document  Appended Document: Patient No Show/[facility] please call pt about this no show and see if he wants to re schedule  Appended Document: Patient No Show/[facility] with lady at home phone and was told pt was not available she could not take message for pt to call back and I could call back later this afternoon.  Appended Document: Patient No Show/[facility] Patient notified as instructed by telephone. Pt said he has not missed a class; he has been to classes so far and has one more to go to.  Appended Document: Patient No Show/[facility] please let the DM center know that  Appended Document: Patient No Show/[facility] with Jinny Sanders at Diabetic teaching and she said that letter to [place] was a mistake and to disregard it.  Appended Document: Patient No Show/[facility] Recieved another ltr where pt no showed for comprehensive diabetes classes-class 2 self mgmt..[facility]...fyi to Dr. Glori Bickers....   he has been going to the class- Is this another error ? - please check in with them   Pt said that he has been going to the diabetic classes but that he did miss one class and he is going to call them to reschedule that class.Ozzie Hoyle LPN  January 31, 9310 3:50 PM

## 2010-06-19 NOTE — Letter (Signed)
Summary: [facility]   Imported By: Edmonia James 11/22/2009 11:57:01  _____________________________________________________________________  External Attachment:    Type:   Image     Comment:   External Document

## 2010-06-21 NOTE — Letter (Signed)
Summary: [place] No Show Letter  [place] at [facility]  [address] [place], [place] [address]   Phone: [phone]  Fax: [phone]    05/29/2010 MRN: [id number]  CLAIR ALFIERI [address] [place], [place]  [address]   Dear Mr. PARENTEAU,   [place] records indicate that you missed your scheduled appointment with __lab___________________ on ___1.9.2012_________.  Please contact this office to reschedule your appointment as soon as possible.  It is important that you keep your scheduled appointments with your physician, so we can provide you the best care possible.  Please be advised that there may be a charge for "no show" appointments.    Sincerely,   [place] at [facility]

## 2010-06-21 NOTE — Assessment & Plan Note (Signed)
Summary: ROA FOR 3 MONTH FOLLOW-UP/JRR   Vital Signs:  Patient profile:   50 year old male Height:      67 inches Weight:      225 pounds BMI:     35.37 Temp:     98.8 degrees F oral Pulse rate:   84 / minute Pulse rhythm:   regular BP sitting:   120 / 82  (left arm) Cuff size:   large  Vitals Entered By: Ozzie Hoyle LPN (May 30, 3149 11:23 AM) CC: three month f/u   History of Present Illness: here for f/u of HTN and DM and lipids and fatty liver   feeling fine with no new health problems very busy holidays   did well with diet and exercise then 18 hour work days around the holidays - then got back on it  wt up 2 lb - ate poorly too   was ref to dm ed -- got letter about no show missed one class - and was extremely helpful   DM-- has not been checking sugars -- "never does it "    lipids - did dec zocor to 20 last visit due to ast/alt and good control he decided to stop it -- it never got called in    diet -- is overall excellent   sit ups and lifting weights     Allergies (verified): No Known Drug Allergies  Past History:  Past Medical History: Last updated: 08/08/2009 Diabetes mellitus, type II Hyperlipidemia obesity HTN  chronic tinea pedis  obesity  Past Surgical History: Last updated: 02/13/2008 Vasectomy (07/2002) Hernia repair- umbilical (76/1607) Myringectomy (06/2005) 9/09 abnormal nuclear stress test   Family History: Last updated: 07/16/2007 no family hx of DM or heart disease  Social History: Last updated: 04/29/2008 Patient is a former smoker. --quit 12/08 started back very seldom -- about 4 cig per week  married works at [employer]  started exercise 8/09  Risk Factors: Smoking Status: quit (08/13/2006)  Review of Systems General:  Denies fatigue, loss of appetite, and malaise. Eyes:  Denies blurring and eye irritation. CV:  Denies chest pain or discomfort, lightheadness, and palpitations. Resp:  Denies cough  and wheezing. GI:  Denies abdominal pain, indigestion, and nausea. GU:  Denies hematuria and urinary frequency. MS:  Denies muscle aches and cramps. Derm:  Denies itching, lesion(s), poor wound healing, and rash. Neuro:  Denies numbness and tingling. Psych:  Denies anxiety and depression. Endo:  Denies cold intolerance, excessive thirst, excessive urination, and heat intolerance. Heme:  Denies abnormal bruising and bleeding.  Physical Exam  General:  overweight but generally well appearing  Head:  normocephalic, atraumatic, and no abnormalities observed.   Eyes:  vision grossly intact, pupils equal, pupils round, and pupils reactive to light.  no conjunctival pallor, injection or icterus  Mouth:  pharynx pink and moist.   Neck:  supple with full rom and no masses or thyromegally, no JVD or carotid bruit  Lungs:  Normal respiratory effort, chest expands symmetrically. Lungs are clear to auscultation, no crackles or wheezes. Heart:  Normal rate and regular rhythm. S1 and S2 normal without gallop, murmur, click, rub or other extra sounds. Abdomen:  Bowel sounds positive,abdomen soft and non-tender without masses, organomegaly or hernias noted. no renal bruits  Msk:  No deformity or scoliosis noted of thoracic or lumbar spine.  no acute joint changes Pulses:  R and L carotid,radial,femoral,dorsalis pedis and posterior tibial pulses are full and equal bilaterally Extremities:  No  clubbing, cyanosis, edema, or deformity noted with normal full range of motion of all joints.   Neurologic:  sensation intact to light touch, gait normal, and DTRs symmetrical and normal.   Skin:  foot fungus has imp- much less skin thickening and scale  toenails are thick Cervical Nodes:  No lymphadenopathy noted Inguinal Nodes:  No significant adenopathy Psych:  normal affect, talkative and pleasant   Diabetes Management Exam:    Foot Exam (with socks and/or shoes not present):       Sensory-Pinprick/Light  touch:          Left medial foot (L-4): normal          Left dorsal foot (L-5): normal          Left lateral foot (S-1): normal          Right medial foot (L-4): normal          Right dorsal foot (L-5): normal          Right lateral foot (S-1): normal       Sensory-Monofilament:          Left foot: normal          Right foot: normal       Inspection:          Left foot: normal          Right foot: normal       Nails:          Left foot: thickened          Right foot: thickened   Impression & Recommendations:  Problem # 1:  HYPERTENSION, BENIGN ESSENTIAL (ICD-401.1) Assessment Unchanged  this is well controlled  urged to keep up with diet and exercise  f/u 3 mo  lab today His updated medication list for this problem includes:    Zestoretic 10-12.5 Mg Tabs (Lisinopril-hydrochlorothiazide) .Marland Kitchen... 1 by mouth once daily  Orders: Venipuncture (42353) TLB-Lipid Panel (80061-LIPID) TLB-Renal Function Panel (80069-RENAL) TLB-ALT (SGPT) (84460-ALT) TLB-AST (SGOT) (84450-SGOT) TLB-A1C / Hgb A1C (Glycohemoglobin) (83036-A1C)  BP today: 120/82 Prior BP: 128/88 (01/05/2010)  Prior 10 Yr Risk Heart Disease: Not enough information (08/08/2009)  Labs Reviewed: K+: 4.4 (01/02/2010) Creat: : 0.9 (01/02/2010)   Chol: 151 (01/02/2010)   HDL: 36.20 (01/02/2010)   LDL: 100 (01/02/2010)   TG: 73.0 (01/02/2010)  Problem # 2:  Hx of FATTY LIVER DISEASE, HX OF (ICD-V12.79) Assessment: Unchanged ast/alt today- happens to be off statin diet improving but some wt gain counseled on this   Problem # 3:  HYPERLIPIDEMIA (ICD-272.4) Assessment: Deteriorated  expect this will be up since he stopped it by accident  will get back on zocor (lower dose in light of transaminases) disc goal for chol disc low sat fat diet  lab and f/u 3 mo  His updated medication list for this problem includes:    Zocor 20 Mg Tabs (Simvastatin) .Marland Kitchen... 1 by mouth once daily  Orders: Venipuncture (61443) TLB-Lipid  Panel (80061-LIPID) TLB-Renal Function Panel (80069-RENAL) TLB-ALT (SGPT) (84460-ALT) TLB-AST (SGOT) (84450-SGOT) TLB-A1C / Hgb A1C (Glycohemoglobin) (83036-A1C) Prescription Created Electronically [id number])  Labs Reviewed: SGOT: 48 (01/02/2010)   SGPT: 74 (01/02/2010)  Lipid Goals: Chol Goal: 200 (08/08/2009)   HDL Goal: 40 (08/08/2009)   LDL Goal: 100 (08/08/2009)   TG Goal: 150 (08/08/2009)  Prior 10 Yr Risk Heart Disease: Not enough information (08/08/2009)   HDL:36.20 (01/02/2010), 42.20 (09/19/2009)  LDL:100 (01/02/2010), 122 (09/19/2009)  Chol:151 (01/02/2010), 183 (09/19/2009)  Trig:73.0 (01/02/2010), 95.0 (09/19/2009)  Problem # 4:  DIABETES MELLITUS, TYPE II (ICD-250.00) Assessment: Unchanged  this may be worse due to poor holiday eating and wt gain (though is back to nl )  much better compliance after DM ed- but needs to start checking sugar at least several times per week lab todayAIC - may be up  lab and f/u 3 mo  utd opthy will keep working on foot care His updated medication list for this problem includes:    Zestoretic 10-12.5 Mg Tabs (Lisinopril-hydrochlorothiazide) .Marland Kitchen... 1 by mouth once daily  Orders: Venipuncture (58099) TLB-Lipid Panel (80061-LIPID) TLB-Renal Function Panel (80069-RENAL) TLB-ALT (SGPT) (84460-ALT) TLB-AST (SGOT) (84450-SGOT) TLB-A1C / Hgb A1C (Glycohemoglobin) (83036-A1C)  Labs Reviewed: Creat: 0.9 (01/02/2010)    Reviewed HgBA1c results: 6.6 (01/02/2010)  7.5 (09/19/2009)  Complete Medication List: 1)  Zocor 20 Mg Tabs (Simvastatin) .Marland Kitchen.. 1 by mouth once daily 2)  Blood Glucose Meter Kit (Blood glucose monitoring suppl) .... Check glucose once daily as needed dm: 250.0 3)  Lancets Misc (Lancets) .... Patient test once daily as needed dx: 250.00 4)  Zestoretic 10-12.5 Mg Tabs (Lisinopril-hydrochlorothiazide) .Marland Kitchen.. 1 by mouth once daily  Patient Instructions: 1)  labs today  2)  stay on healthy diet and exercise schedule  3)  get back  on zocor - I sent it to your pharmacy 4)  schedule fasting labs and follow up in 3 months lipid/hepatic/ AIC 250.0 and 272 Prescriptions: ZOCOR 20 MG TABS (SIMVASTATIN) 1 by mouth once daily  #30 x 11   Entered and Authorized by:   Allena Earing MD   Signed by:   Allena Earing MD on 05/30/2010   Method used:   Electronically to        [facility]. #[id number]* (retail)       [place], [place]  [address]       Ph: [phone]       Fax: [phone]   RxID:   [id number]    Orders Added: 1)  Venipuncture [62229] 2)  TLB-Lipid Panel [80061-LIPID] 3)  TLB-Renal Function Panel [80069-RENAL] 4)  TLB-ALT (SGPT) [84460-ALT] 5)  TLB-AST (SGOT) [84450-SGOT] 6)  TLB-A1C / Hgb A1C (Glycohemoglobin) [83036-A1C] 7)  Prescription Created Electronically [G8553] 8)  Est. Patient Level IV [79892]    Current Allergies (reviewed today): No known allergies

## 2010-06-21 NOTE — Letter (Signed)
Summary: [place] No Show Letter  [place] at [facility]  [address] [place], [place] [address]   Phone: [phone]  Fax: [phone]    05/23/2010 MRN: [id number]  Zachary Lewis [address] [place], [place]  [address]   Dear Mr. RAHAL,   [facility] records indicate that you missed your scheduled appointment with _______Lab______________ on ___1/3/2012_________.  Please contact this office to reschedule your appointment as soon as possible.  It is important that you keep your scheduled appointments with your physician, so we can provide you the best care possible.  Please be advised that there may be a charge for "no show" appointments.    Sincerely,   [place] at [facility]

## 2010-07-11 ENCOUNTER — Encounter: Payer: Self-pay | Admitting: Family Medicine

## 2010-07-23 ENCOUNTER — Encounter: Payer: Self-pay | Admitting: Family Medicine

## 2010-07-23 DIAGNOSIS — B353 Tinea pedis: Secondary | ICD-10-CM | POA: Insufficient documentation

## 2010-07-23 DIAGNOSIS — E785 Hyperlipidemia, unspecified: Secondary | ICD-10-CM | POA: Insufficient documentation

## 2010-07-23 DIAGNOSIS — E669 Obesity, unspecified: Secondary | ICD-10-CM | POA: Insufficient documentation

## 2010-07-23 DIAGNOSIS — I10 Essential (primary) hypertension: Secondary | ICD-10-CM | POA: Insufficient documentation

## 2010-08-23 ENCOUNTER — Other Ambulatory Visit: Payer: Self-pay | Admitting: Family Medicine

## 2010-08-23 ENCOUNTER — Telehealth: Payer: Self-pay | Admitting: Family Medicine

## 2010-08-23 DIAGNOSIS — I10 Essential (primary) hypertension: Secondary | ICD-10-CM

## 2010-08-23 DIAGNOSIS — Z0000 Encounter for general adult medical examination without abnormal findings: Secondary | ICD-10-CM

## 2010-08-23 DIAGNOSIS — E7800 Pure hypercholesterolemia, unspecified: Secondary | ICD-10-CM

## 2010-08-23 DIAGNOSIS — E785 Hyperlipidemia, unspecified: Secondary | ICD-10-CM

## 2010-08-23 DIAGNOSIS — E119 Type 2 diabetes mellitus without complications: Secondary | ICD-10-CM

## 2010-08-23 DIAGNOSIS — Z0001 Encounter for general adult medical examination with abnormal findings: Secondary | ICD-10-CM | POA: Insufficient documentation

## 2010-08-23 NOTE — Telephone Encounter (Signed)
Message copied by Loura Pardon on Thu Aug 23, 2010 10:45 AM ------      Message from: Teresa Pelton      Created: Thu Aug 23, 2010  7:46 AM      Regarding: FW: Cpx Labs Mon                   ----- Message -----         From: Rich Reining         Sent: 08/22/2010  10:38 AM           To: Teresa Pelton, MD      Subject: Cpx Labs Mon                                             Please order  future cpx labs for pt's upcomming lab appt.      Thanks      Aniceto Boss

## 2010-08-28 ENCOUNTER — Other Ambulatory Visit: Payer: Self-pay

## 2010-08-30 ENCOUNTER — Ambulatory Visit: Payer: Self-pay | Admitting: Family Medicine

## 2010-09-05 ENCOUNTER — Other Ambulatory Visit (INDEPENDENT_AMBULATORY_CARE_PROVIDER_SITE_OTHER): Payer: Managed Care, Other (non HMO) | Admitting: Family Medicine

## 2010-09-05 DIAGNOSIS — E785 Hyperlipidemia, unspecified: Secondary | ICD-10-CM

## 2010-09-05 DIAGNOSIS — I10 Essential (primary) hypertension: Secondary | ICD-10-CM

## 2010-09-05 DIAGNOSIS — E7800 Pure hypercholesterolemia, unspecified: Secondary | ICD-10-CM

## 2010-09-05 DIAGNOSIS — E119 Type 2 diabetes mellitus without complications: Secondary | ICD-10-CM

## 2010-09-05 DIAGNOSIS — Z0000 Encounter for general adult medical examination without abnormal findings: Secondary | ICD-10-CM

## 2010-09-05 LAB — CBC WITH DIFFERENTIAL/PLATELET
Basophils Absolute: 0.1 10*3/uL (ref 0.0–0.1)
Lymphocytes Relative: 30.5 % (ref 12.0–46.0)
Lymphs Abs: 2.4 10*3/uL (ref 0.7–4.0)
Monocytes Relative: 11.1 % (ref 3.0–12.0)
Neutrophils Relative %: 51.6 % (ref 43.0–77.0)
Platelets: 258 10*3/uL (ref 150.0–400.0)
RDW: 14.2 % (ref 11.5–14.6)

## 2010-09-05 LAB — HEPATIC FUNCTION PANEL
ALT: 88 U/L — ABNORMAL HIGH (ref 0–53)
AST: 51 U/L — ABNORMAL HIGH (ref 0–37)
Albumin: 4.2 g/dL (ref 3.5–5.2)
Alkaline Phosphatase: 109 U/L (ref 39–117)
Total Protein: 7.4 g/dL (ref 6.0–8.3)

## 2010-09-05 LAB — LIPID PANEL
Cholesterol: 220 mg/dL — ABNORMAL HIGH (ref 0–200)
HDL: 55.1 mg/dL (ref >39.00)
Triglycerides: 96 mg/dL (ref 0.0–149.0)

## 2010-09-05 LAB — COMPREHENSIVE METABOLIC PANEL
ALT: 88 U/L — ABNORMAL HIGH (ref 0–53)
CO2: 28 mEq/L (ref 19–32)
Chloride: 104 mEq/L (ref 96–112)
GFR: 89.81 mL/min (ref >60.00)
Potassium: 4.9 mEq/L (ref 3.5–5.1)
Sodium: 141 mEq/L (ref 135–145)
Total Bilirubin: 0.4 mg/dL (ref 0.3–1.2)
Total Protein: 7.4 g/dL (ref 6.0–8.3)

## 2010-09-05 NOTE — Telephone Encounter (Signed)
Addended by: Gaetano Net on: 09/05/2010 08:36 AM   Modules accepted: Orders

## 2010-09-05 NOTE — Telephone Encounter (Signed)
Canceled dup lab order

## 2010-09-11 NOTE — Progress Notes (Signed)
Will disc with pt at upcoming follow up

## 2010-09-12 ENCOUNTER — Encounter: Payer: Self-pay | Admitting: Family Medicine

## 2010-09-12 ENCOUNTER — Ambulatory Visit (INDEPENDENT_AMBULATORY_CARE_PROVIDER_SITE_OTHER): Payer: Managed Care, Other (non HMO) | Admitting: Family Medicine

## 2010-09-12 DIAGNOSIS — Z8719 Personal history of other diseases of the digestive system: Secondary | ICD-10-CM

## 2010-09-12 DIAGNOSIS — E119 Type 2 diabetes mellitus without complications: Secondary | ICD-10-CM

## 2010-09-12 DIAGNOSIS — E785 Hyperlipidemia, unspecified: Secondary | ICD-10-CM

## 2010-09-12 DIAGNOSIS — E669 Obesity, unspecified: Secondary | ICD-10-CM

## 2010-09-12 DIAGNOSIS — I10 Essential (primary) hypertension: Secondary | ICD-10-CM

## 2010-09-12 MED ORDER — LISINOPRIL-HYDROCHLOROTHIAZIDE 10-12.5 MG PO TABS: 1.0000 | ORAL_TABLET | Freq: Every day | ORAL | Status: DC

## 2010-09-12 MED ORDER — METFORMIN HCL 500 MG PO TABS: 500.0000 mg | ORAL_TABLET | Freq: Two times a day (BID) | ORAL | Status: DC

## 2010-09-12 NOTE — Assessment & Plan Note (Signed)
Getting back on track with diet and exercise and wt loss after a slip opthy utd  Rev labs with pt  Start metformin 500 bid- update if side eff Lab and f/u 3 mo  Check sugar bid

## 2010-09-12 NOTE — Assessment & Plan Note (Signed)
Again stressed imp of wt loss for comorbidities of DM and HTN and lipids Disc plan for wt loss

## 2010-09-12 NOTE — Progress Notes (Signed)
Subjective:    Patient ID: Zachary Lewis, male    DOB: 02-28-1961, 50 y.o.   MRN: [id number]  HPI Here for f/u of DM and lipids and HTN and obesity and fatty liver He is dissapointed in his lab results  Slacked for a while on diet and exercise  Had several deaths in the family and friends  A lot going on in general  Got caught up and stopped caring for himself   Just started back again  Is back on treadmill 40 minutes 4 times weekly and then sit ups  2 weeks now  Is eating very well now -- dm diet , low fat , grilled foods  Worked a lot during [employer] is up 1 lb with bmi of 35- gained and lost   DM - a1c is 7.3 up from 7.2 opthy within last year -- normal in aug 2011 - Dr Teressa Senter  Foot care- good / tinea getting better    Lipids - with zocor the LDL down from 180 to 144- good imp Diet - getting  Better No side effects  Lab Results  Component Value Date   CHOL 220* 09/05/2010   CHOL 260* 05/30/2010   CHOL 151 01/02/2010   Lab Results  Component Value Date   HDL 55.10 09/05/2010   HDL 60.50 05/30/2010   HDL 36.20* 01/02/2010   Lab Results  Component Value Date   LDLCALC 100* 01/02/2010   LDLCALC 122* 09/19/2009   LDLCALC 107* 01/21/2008   Lab Results  Component Value Date   TRIG 96.0 09/05/2010   TRIG 198.0* 05/30/2010   TRIG 73.0 01/02/2010   Lab Results  Component Value Date   CHOLHDL 4 09/05/2010   CHOLHDL 4 05/30/2010   CHOLHDL 4 01/02/2010    LFTs are up a bit on the statin at 51/88 Has fatty liver    Lab Results  Component Value Date   ALT 88* 09/05/2010   ALT 88* 09/05/2010   AST 51* 09/05/2010   AST 51* 09/05/2010   ALKPHOS 109 09/05/2010   ALKPHOS 109 09/05/2010   BILITOT 0.4 09/05/2010   BILITOT 0.4 09/05/2010     Past Medical History  Diagnosis Date  . Diabetes mellitus     Type II  . Hyperlipidemia   . Hypertension   . Obesity   . Tinea pedis     Chronic   Past Surgical History  Procedure Date  . Vasectomy 07/2002  . Umbilical hernia  repair 01/1997  . Myringectomy 06/2005    reports that he quit smoking about 3 years ago. He does not have any smokeless tobacco history on file. His alcohol and drug histories not on file. family history is negative for Diabetes and Heart disease. No Known Allergies     Review of Systems Review of Systems  Constitutional: Negative for fever, appetite change, fatigue and unexpected weight change.  Eyes: Negative for pain and visual disturbance.  Respiratory: Negative for cough and shortness of breath.   Cardiovascular: Negative.   Gastrointestinal: Negative for nausea, diarrhea and constipation. , neg for abd pain  Genitourinary: Negative for urgency and frequency.  Skin: Negative for pallor. , pos for itching of feet (but improving) Neurological: Negative for weakness, light-headedness, numbness and headaches.  Hematological: Negative for adenopathy. Does not bruise/bleed easily.  Psychiatric/Behavioral: Negative for dysphoric mood. The patient is not nervous/anxious.          Objective:   Physical Exam  Constitutional: He appears well-developed  and well-nourished.       overwt and well appearing   HENT:  Head: Normocephalic and atraumatic.  Eyes: Conjunctivae and EOM are normal. Pupils are equal, round, and reactive to light.  Neck: Normal range of motion. No JVD present. Carotid bruit is not present. No thyromegaly present.  Cardiovascular: Normal rate, regular rhythm and normal heart sounds.   Pulmonary/Chest: Effort normal and breath sounds normal. He has no wheezes. He has no rales.  Abdominal: Soft. Bowel sounds are normal. He exhibits no distension, no abdominal bruit and no mass. There is no tenderness.  Musculoskeletal: Normal range of motion. He exhibits no edema and no tenderness.  Lymphadenopathy:    He has no cervical adenopathy.  Neurological: He is alert. He has normal reflexes. No sensory deficit. Coordination normal.  Skin: Skin is warm and dry. Rash noted.  No erythema. No pallor.       Feet have mild flaking and scale- but improved from previous Very thickened toenails but not overgrown or ingrown  Psychiatric: He has a normal mood and affect.          Assessment & Plan:

## 2010-09-12 NOTE — Assessment & Plan Note (Signed)
Much imp with zocor but not quite at goal Disc low sat fat diet  App to inc dose due to lft Re check 3 mo and f/u Rev labs in detail today

## 2010-09-12 NOTE — Assessment & Plan Note (Signed)
lft up with poor diet/ wt gain and statin Disc wt loss plan Re check 3 mo  If up further reconsider statin choice

## 2010-09-12 NOTE — Assessment & Plan Note (Signed)
This is well controlled with ace/ hct Refilled Lab and f/u 3 mo

## 2010-09-12 NOTE — Patient Instructions (Signed)
Keep up the better diet and exercise Avoid red meat/ fried foods/ egg yolks/ fatty breakfast meats/ butter, cheese and high fat dairy/ and shellfish  Low sugar and low starches  Start metformin 500 mg twice daily  Keep track of sugars  If any problems or side effects let me know Work on weight loss Follow up in 3 months with labs prior

## 2010-10-02 NOTE — Assessment & Plan Note (Signed)
[facility] OFFICE NOTE   NAME:Lewis, Zachary DEREN                     MRN:          [id number]  DATE:02/23/2008                            DOB:          24-Oct-1960    PRIMARY CARE/REFERRING DOCTOR:  Roque Lias A. Tower, MD   REASON FOR CONSULTATION:  Chest pain and abnormal Myoview.   HISTORY OF PRESENT ILLNESS:  Mr. Zachary Lewis is a very pleasant 50 year old  male with multiple cardiac risk factors including obesity, borderline  diabetes, hypertension, hyperlipidemia and ongoing tobacco use.  He  denies any known history of coronary artery disease.  He has never had a  cardiac catheterization.   On September 13, he developed chest pain radiating across his chest  after sex.  This lasted about a little over an hour.  He went to the  emergency room.  Poorly, his EKG and cardiac markers were normal.  He  was then scheduled for an outpatient stress test.  He had a Myoview in  our office on September 23.  He was unable to walk due to baseline  hypertension with a blood pressure of 144/105.  He received adenosine.  His ejection fraction was 55%.  There is some question of possible minor  anteroseptal ischemia noted.  However, there was a mildly increased TID  raising the question of  balanced ischemia.   Since that time, he has not had any further episodes.  He is quite  active.  He does some walking on the treadmill 3.2 miles an hour on a  mild incline without any chest pain.  He is very active at work.  He  smokes 8-10 cigarettes a day and is under a lot of stress.  He was  previously on Zocor for his cholesterol, but he said it was getting  better so he stopped.  I have asked him to start baby aspirin 81 mg a  day.   REVIEW OF SYSTEMS:  Notable for heavy snoring and possible occasional  apnea.  The remainder of review of systems is negative except for HPI  and problem list.   PROBLEM LIST:  Pre-diabetes/diabetes,  hypertension, hyperlipidemia,  obesity, probable obstructive sleep apnea and tobacco use, ongoing.   ALLERGIES:  No known drug allergies.   SOCIAL HISTORY:  He is married.  He has five children.  He works as an  Chief Financial Officer at [employer].  He smokes 8-10 cigarettes a day for  which he has done for about 20 years.  Does not use drugs.   SOCIAL HISTORY:  Mother and three sisters are all in good health.  Does  not know his father or his father's family history.   PHYSICAL EXAMINATION:  GENERAL:  He is in no acute distress.  Ambulates  around the clinic without any respiratory difficulty.  VITAL SIGNS:  Blood pressure is 126/82, heart rate is 81, weight is 213.  HEENT:  Normal.  NECK:  Thick, supple, unable to clearly assess JVD.  Carotids are 2+  bilaterally without bruits.  There is no lymphadenopathy or thyromegaly.  CARDIAC:  PMI  is nondisplaced.  He is regular rate and rhythm.  No  obvious murmurs, rubs or gallops.  LUNGS:  Clear.  ABDOMEN:  Obese, nontender, nondistended.  No obvious  hepatosplenomegaly.  No bruits.  No masses.  Good bowel sounds.  EXTREMITIES:  Warm with no cyanosis, clubbing or edema.  No rash.  NEUROLOGICAL:  He is alert and oriented x3.  Cranial nerves II through  XII are intact.  Moves all four extremities without difficulty.  Affect  is pleasant.   EKG shows normal sinus rhythm with mild T-wave flattening  inferolaterally.   ASSESSMENT AND PLAN:  1. Chest pain.  I have reviewed his Myoview study closely.  I really      do think this is an equivocal study.  I do not see any convincing      perfusion abnormalities; however, he does have mild transient      ischemic dilation.  Given these factors in light of all his risk      factor, I have suggested proceeding with outpatient cardiac      catheterization to further evaluate.  I have also given him the      opportunity to proceed with medical therapy and see how he does.      He would like to talk  to his wife and decide.  He will get back to      [place] soon.  2. Hyperlipidemia.  His Lipids look much better on Zocor.      Unfortunately, he has stopped this.  I have asked him to restart.  3. Hypertension.  Blood pressure looks pretty good here.  This will      need close follow up.  4. Tobacco use.  I had a long talk about the need to stop smoking.  5. Probable obstructive sleep apnea.  I would recommend weight loss      and a sleep study.   DISPOSITION:  I had a long talk with Mr. Higginson and his wife about his  multiple cardiac risk factors and his high risk for developing  cardiovascular disease as well as renal failure and stroke.  I have  urged him to work hard to control his risk factors through smoking  cessation, exercise, diet and medical therapy.  They will come back and  see me in a few months for followup.     Shaune Pascal. Bensimhon, MD  Electronically Signed    DRB/MedQ  DD: 02/23/2008  DT: 02/24/2008  Job #: [id number]

## 2010-11-06 ENCOUNTER — Encounter: Payer: Self-pay | Admitting: Cardiovascular Disease

## 2010-12-19 ENCOUNTER — Other Ambulatory Visit: Payer: Managed Care, Other (non HMO)

## 2010-12-24 ENCOUNTER — Ambulatory Visit: Payer: Managed Care, Other (non HMO) | Admitting: Family Medicine

## 2011-02-20 LAB — POCT CARDIAC MARKERS
Myoglobin, poc: 290
Myoglobin, poc: 98.2
Troponin i, poc: <0.05

## 2011-02-20 LAB — POCT I-STAT, CHEM 8
BUN: 16
Creatinine, Ser: 1.3
Potassium: 3.9
Sodium: 137
TCO2: 23

## 2011-10-30 ENCOUNTER — Encounter: Payer: Self-pay | Admitting: Family Medicine

## 2011-10-30 ENCOUNTER — Ambulatory Visit (INDEPENDENT_AMBULATORY_CARE_PROVIDER_SITE_OTHER): Payer: Managed Care, Other (non HMO) | Admitting: Family Medicine

## 2011-10-30 VITALS — BP 144/78 | HR 76 | Temp 98.2°F | Ht 67.0 in | Wt 231.0 lb

## 2011-10-30 DIAGNOSIS — E669 Obesity, unspecified: Secondary | ICD-10-CM

## 2011-10-30 DIAGNOSIS — E785 Hyperlipidemia, unspecified: Secondary | ICD-10-CM

## 2011-10-30 DIAGNOSIS — Z8719 Personal history of other diseases of the digestive system: Secondary | ICD-10-CM

## 2011-10-30 DIAGNOSIS — E119 Type 2 diabetes mellitus without complications: Secondary | ICD-10-CM

## 2011-10-30 DIAGNOSIS — I10 Essential (primary) hypertension: Secondary | ICD-10-CM

## 2011-10-30 DIAGNOSIS — Z23 Encounter for immunization: Secondary | ICD-10-CM

## 2011-10-30 LAB — HEMOGLOBIN A1C: Hgb A1c MFr Bld: 9.2 % — ABNORMAL HIGH (ref 4.6–6.5)

## 2011-10-30 LAB — LIPID PANEL
Cholesterol: 253 mg/dL — ABNORMAL HIGH (ref 0–200)
HDL: 55.6 mg/dL (ref >39.00)
Triglycerides: 123 mg/dL (ref 0.0–149.0)
VLDL: 24.6 mg/dL (ref 0.0–40.0)

## 2011-10-30 LAB — COMPREHENSIVE METABOLIC PANEL
AST: 71 U/L — ABNORMAL HIGH (ref 0–37)
Albumin: 4.2 g/dL (ref 3.5–5.2)
Alkaline Phosphatase: 122 U/L — ABNORMAL HIGH (ref 39–117)
BUN: 14 mg/dL (ref 6–23)
Potassium: 3.9 mEq/L (ref 3.5–5.1)
Total Bilirubin: 0.3 mg/dL (ref 0.3–1.2)

## 2011-10-30 LAB — CBC WITH DIFFERENTIAL/PLATELET
Basophils Relative: 1 % (ref 0.0–3.0)
Eosinophils Absolute: 0.5 10*3/uL (ref 0.0–0.7)
HCT: 47 % (ref 39.0–52.0)
Lymphs Abs: 2 10*3/uL (ref 0.7–4.0)
MCHC: 33.8 g/dL (ref 30.0–36.0)
MCV: 101 fl — ABNORMAL HIGH (ref 78.0–100.0)
Monocytes Absolute: 0.8 10*3/uL (ref 0.1–1.0)
Neutrophils Relative %: 63.8 % (ref 43.0–77.0)
Platelets: 216 10*3/uL (ref 150.0–400.0)
RBC: 4.66 Mil/uL (ref 4.22–5.81)

## 2011-10-30 LAB — TSH: TSH: 2.5 u[IU]/mL (ref 0.35–5.50)

## 2011-10-30 MED ORDER — SIMVASTATIN 20 MG PO TABS: 20.0000 mg | ORAL_TABLET | Freq: Every day | ORAL | Status: DC

## 2011-10-30 MED ORDER — BLOOD GLUCOSE METER KIT: PACK | Status: DC

## 2011-10-30 MED ORDER — LISINOPRIL-HYDROCHLOROTHIAZIDE 10-12.5 MG PO TABS: 1.0000 | ORAL_TABLET | Freq: Every day | ORAL | Status: DC

## 2011-10-30 MED ORDER — LANCETS MISC: Status: DC

## 2011-10-30 NOTE — Assessment & Plan Note (Signed)
Labs today Has had net wt gain  Disc need for low fat diet and wt loss Also on statin

## 2011-10-30 NOTE — Progress Notes (Signed)
Subjective:    Patient ID: Zachary Lewis, male    DOB: 1960/07/11, 51 y.o.   MRN: [id number]  HPI Here for f/u of chronic conditions  Way overdue for f/u and labs- non compliant  bp is  Up a bit    Today BP Readings from Last 3 Encounters:  10/30/11 144/78  09/12/10 120/84  05/30/10 120/82    No cp or palpitations or headaches or edema  No side effects to medicines    Wt is up 5 lb with bmi of 36 Health habits- he thinks he actually lost some - 7 lb by his scale -went up and then down  Was working out until 2 weeks ago  ? Why he stopped - back to it this afternoon  Is watching everything for sugar and fats   Needs lipid check  Low dose simvastatin Lab Results  Component Value Date   CHOL 220* 09/05/2010   HDL 55.10 09/05/2010   LDLCALC 100* 01/02/2010   LDLDIRECT 144.6 09/05/2010   TRIG 96.0 09/05/2010   CHOLHDL 4 09/05/2010   diet  Diabetes Home sugar results -- does not have a meter - no longer  DM diet - sticking with that  Exercise - treadmill 40 min and sit ups and walking dog  Symptoms-no thirst or urination  A1C last  Lab Results  Component Value Date   HGBA1C 7.3* 09/05/2010    Took metformin twice - gave him diarrhea - will try something different  Renal protection- on ace  Last eye exam - about a year ago  Will make own appt   pnemovax 07-wants to do it now  Did not get flu shot     Patient Active Problem List  Diagnosis  . TINEA PEDIS  . DIABETES MELLITUS, TYPE II  . HYPERLIPIDEMIA  . OBESITY  . HYPERTENSION, BENIGN ESSENTIAL  . BACK PAIN, LUMBAR, WITH RADICULOPATHY  . ALLERGY  . FATTY LIVER DISEASE, HX OF  . TOBACCO USE, QUIT  . Routine general medical examination at a health care facility   Past Medical History  Diagnosis Date  . Diabetes mellitus     Type II  . Hyperlipidemia   . Hypertension   . Obesity   . Tinea pedis     Chronic   Past Surgical History  Procedure Date  . Vasectomy 07/2002  . Umbilical hernia repair 78/2956    . Myringectomy 06/2005   History  Substance Use Topics  . Smoking status: Former Smoker    Quit date: 04/20/2007  . Smokeless tobacco: Never Used   Comment: Started back very seldom, 4 cig per week.  . Alcohol Use: Yes     occassionally   Family History  Problem Relation Age of Onset  . Diabetes Neg Hx   . Heart disease Neg Hx    Allergies  Allergen Reactions  . Metformin And Related     diarrhea   Current Outpatient Prescriptions on File Prior to Visit  Medication Sig Dispense Refill  . lisinopril-hydrochlorothiazide (PRINZIDE,ZESTORETIC) 10-12.5 MG per tablet Take 1 tablet by mouth daily.  30 tablet  11  . simvastatin (ZOCOR) 20 MG tablet Take 1 tablet (20 mg total) by mouth at bedtime.  30 tablet  11  . metFORMIN (GLUCOPHAGE) 500 MG tablet Take 1 tablet (500 mg total) by mouth 2 (two) times daily with a meal.  30 tablet  11     Review of Systems Review of Systems  Constitutional: Negative for fever, appetite change,  fatigue and unexpected weight change.  Eyes: Negative for pain and visual disturbance.  Respiratory: Negative for cough and shortness of breath.   Cardiovascular: Negative for cp or palpitations    Gastrointestinal: Negative for nausea, diarrhea and constipation.  Genitourinary: Negative for urgency and frequency.  Skin: Negative for pallor or rash   Neurological: Negative for weakness, light-headedness, numbness and headaches.  Hematological: Negative for adenopathy. Does not bruise/bleed easily.  Psychiatric/Behavioral: Negative for dysphoric mood. The patient is not nervous/anxious.         Objective:   Physical Exam  Constitutional: He appears well-developed and well-nourished. No distress.       Obese and well appearing   HENT:  Head: Normocephalic and atraumatic.  Right Ear: External ear normal.  Left Ear: External ear normal.  Nose: Nose normal.  Mouth/Throat: Oropharynx is clear and moist. No oropharyngeal exudate.  Eyes: Conjunctivae and  EOM are normal. Pupils are equal, round, and reactive to light. No scleral icterus.  Neck: Normal range of motion. Neck supple. No JVD present. Carotid bruit is not present. No thyromegaly present.  Cardiovascular: Normal rate, regular rhythm, normal heart sounds and intact distal pulses.  Exam reveals no gallop.   Pulmonary/Chest: Effort normal and breath sounds normal. No respiratory distress. He has no wheezes.  Abdominal: Soft. Bowel sounds are normal. He exhibits no distension. There is no tenderness.  Musculoskeletal: He exhibits no edema.  Lymphadenopathy:    He has no cervical adenopathy.  Neurological: He is alert. He has normal reflexes. No cranial nerve deficit. He exhibits normal muscle tone. Coordination normal.  Skin: Skin is warm and dry. No rash noted. No erythema. No pallor.  Psychiatric: He has a normal mood and affect.          Assessment & Plan:

## 2011-10-30 NOTE — Assessment & Plan Note (Signed)
bp up a bit today Lab today Disc returning to exercise and working on wt loss  F/u 3 mo

## 2011-10-30 NOTE — Assessment & Plan Note (Signed)
Off metformin due to intol Diet and exercise fair- ? Motivation  Sent new px for glucose test supplies to check daily Disc DM diet and need for wt loss  Will schedule own eye exam Lab today May need to consider sufonurea

## 2011-10-30 NOTE — Patient Instructions (Addendum)
Pneumovax today Do not forget to schedule your annual eye exam  Lab today Stick to a diabetic diet  Keep exercising at least 30 minutes 5 days per week  Follow up with me in 3 months

## 2011-10-30 NOTE — Assessment & Plan Note (Signed)
Labs today On simvastatin and diet Fair control in the past

## 2011-10-30 NOTE — Assessment & Plan Note (Signed)
Discussed how this problem influences overall health and the risks it imposes  Reviewed plan for weight loss with lower calorie diet (via better food choices and also portion control or program like weight watchers) and exercise building up to or more than 30 minutes 5 days per week including some aerobic activity   he does not seem very motivated

## 2011-10-31 ENCOUNTER — Telehealth: Payer: Self-pay

## 2011-10-31 MED ORDER — GLIPIZIDE ER 5 MG PO TB24: 5.0000 mg | ORAL_TABLET | Freq: Every day | ORAL | Status: DC

## 2011-10-31 NOTE — Telephone Encounter (Signed)
Pt Dr Glori Bickers instruction on result note 10/31/11 sent Glucotrol XL to [facility] [address]

## 2012-01-31 ENCOUNTER — Ambulatory Visit: Payer: Managed Care, Other (non HMO) | Admitting: Family Medicine

## 2012-02-10 ENCOUNTER — Ambulatory Visit: Payer: Managed Care, Other (non HMO) | Admitting: Family Medicine

## 2012-02-17 ENCOUNTER — Encounter: Payer: Self-pay | Admitting: Family Medicine

## 2012-02-17 ENCOUNTER — Ambulatory Visit (INDEPENDENT_AMBULATORY_CARE_PROVIDER_SITE_OTHER): Payer: Managed Care, Other (non HMO) | Admitting: Family Medicine

## 2012-02-17 VITALS — BP 130/85 | HR 88 | Temp 98.1°F | Ht 67.0 in | Wt 218.2 lb

## 2012-02-17 DIAGNOSIS — E119 Type 2 diabetes mellitus without complications: Secondary | ICD-10-CM

## 2012-02-17 DIAGNOSIS — Z23 Encounter for immunization: Secondary | ICD-10-CM

## 2012-02-17 DIAGNOSIS — Z8719 Personal history of other diseases of the digestive system: Secondary | ICD-10-CM

## 2012-02-17 DIAGNOSIS — E785 Hyperlipidemia, unspecified: Secondary | ICD-10-CM

## 2012-02-17 DIAGNOSIS — I10 Essential (primary) hypertension: Secondary | ICD-10-CM

## 2012-02-17 DIAGNOSIS — E669 Obesity, unspecified: Secondary | ICD-10-CM

## 2012-02-17 LAB — HEMOGLOBIN A1C: Hgb A1c MFr Bld: 7.2 % — ABNORMAL HIGH (ref 4.6–6.5)

## 2012-02-17 LAB — COMPREHENSIVE METABOLIC PANEL
Alkaline Phosphatase: 91 U/L (ref 39–117)
BUN: 12 mg/dL (ref 6–23)
CO2: 25 mEq/L (ref 19–32)
Creatinine, Ser: 0.9 mg/dL (ref 0.4–1.5)
GFR: 109.52 mL/min (ref >60.00)
Glucose, Bld: 119 mg/dL — ABNORMAL HIGH (ref 70–99)
Sodium: 136 mEq/L (ref 135–145)
Total Bilirubin: 0.6 mg/dL (ref 0.3–1.2)

## 2012-02-17 LAB — LIPID PANEL
Cholesterol: 272 mg/dL — ABNORMAL HIGH (ref 0–200)
HDL: 52.4 mg/dL (ref >39.00)
Triglycerides: 135 mg/dL (ref 0.0–149.0)
VLDL: 27 mg/dL (ref 0.0–40.0)

## 2012-02-17 MED ORDER — SIMVASTATIN 20 MG PO TABS: 20.0000 mg | ORAL_TABLET | Freq: Every day | ORAL | Status: DC

## 2012-02-17 NOTE — Patient Instructions (Signed)
Keep working hard on diet and exercise and weight loss Start using a lotion for athletes food (anti fungal lotion) on feet every day- example- lotrimin or tinactin  Flu shot today Lab today Follow upin about 3 months

## 2012-02-17 NOTE — Progress Notes (Signed)
  Subjective:    Patient ID: Zachary Lewis, male    DOB: 1960/12/13, 51 y.o.   MRN: [id number]  HPI Here for f/u of chronic conditions  bp is stable today  No cp or palpitations or headaches or edema  No side effects to medicines  BP Readings from Last 3 Encounters:  02/17/12 146/86  10/30/11 144/78  09/12/10 120/84      Wt is down 13 lb with bmi of 34 Proud of that  Eating correctly and exercising (treadmill and stationary bike)- feels better !   On glipizide for DM- no problems with that  Diabetes Home sugar results - good overall , ams - low 100s , does not check pms  DM diet - a lot better Exercise - a lot better  Symptoms A1C last  Lab Results  Component Value Date   HGBA1C 9.2* 10/30/2011    No problems with medications  Renal protection Last eye exam within the year    Fatty liver- need to check labs  Lab Results  Component Value Date   ALT 101* 10/30/2011   AST 71* 10/30/2011   ALKPHOS 122* 10/30/2011   BILITOT 0.3 10/30/2011      Review of Systems    Review of Systems  Constitutional: Negative for fever, appetite change, fatigue and unexpected weight change.  Eyes: Negative for pain and visual disturbance.  Respiratory: Negative for cough and shortness of breath.   Cardiovascular: Negative for cp or palpitations    Gastrointestinal: Negative for nausea, diarrhea and constipation.  Genitourinary: Negative for urgency and frequency.  Skin: Negative for pallor or rash   Neurological: Negative for weakness, light-headedness, numbness and headaches.  Hematological: Negative for adenopathy. Does not bruise/bleed easily.  Psychiatric/Behavioral: Negative for dysphoric mood. The patient is not nervous/anxious.      Objective:   Physical Exam  Constitutional: He appears well-developed and well-nourished.       obese and well appearing   HENT:  Head: Normocephalic and atraumatic.  Right Ear: External ear normal.  Left Ear: External ear normal.  Nose:  Nose normal.  Mouth/Throat: Oropharynx is clear and moist.  Eyes: Conjunctivae normal and EOM are normal. Pupils are equal, round, and reactive to light. Right eye exhibits no discharge. Left eye exhibits no discharge. No scleral icterus.  Neck: Normal range of motion. Neck supple. No JVD present. Carotid bruit is not present. No thyromegaly present.  Cardiovascular: Normal rate, regular rhythm, normal heart sounds and intact distal pulses.  Exam reveals no gallop.   Pulmonary/Chest: Effort normal and breath sounds normal. No respiratory distress. He has no wheezes.  Abdominal: Soft. Bowel sounds are normal. He exhibits no distension, no abdominal bruit and no mass. There is no tenderness.  Musculoskeletal: He exhibits no edema.  Lymphadenopathy:    He has no cervical adenopathy.  Neurological: He is alert. He has normal reflexes. No cranial nerve deficit. He exhibits normal muscle tone. Coordination normal.  Skin: Skin is warm and dry. Rash noted.       Baseline athlete's foot with scaling and maceration and thickened toenails  Psychiatric: He has a normal mood and affect.          Assessment & Plan:

## 2012-02-17 NOTE — Assessment & Plan Note (Signed)
Lab today with wt loss Hope for improvement

## 2012-02-17 NOTE — Assessment & Plan Note (Signed)
Now on glipizide and with wt loss/ better habits a1c today Flu shot today

## 2012-02-17 NOTE — Assessment & Plan Note (Signed)
bp in fair control at this time  No changes needed  Disc lifstyle change with low sodium diet and exercise  Better on 2nd check

## 2012-02-17 NOTE — Assessment & Plan Note (Signed)
Commended on wt loss so far Discussed how this problem influences overall health and the risks it imposes  Reviewed plan for weight loss with lower calorie diet (via better food choices and also portion control or program like weight watchers) and exercise building up to or more than 30 minutes 5 days per week including some aerobic activity

## 2012-02-17 NOTE — Assessment & Plan Note (Signed)
Pt never did start zocor Sent again to the pharmacy Lipids today will likely be high  Rev low sat fat diet

## 2012-05-18 ENCOUNTER — Ambulatory Visit: Payer: Managed Care, Other (non HMO) | Admitting: Family Medicine

## 2012-05-20 HISTORY — PX: COLONOSCOPY W/ POLYPECTOMY: SHX1380

## 2012-10-29 ENCOUNTER — Other Ambulatory Visit: Payer: Self-pay | Admitting: Family Medicine

## 2012-10-30 NOTE — Telephone Encounter (Signed)
Left voicemail requesting pt to call office 

## 2012-10-30 NOTE — Telephone Encounter (Signed)
Electronic refill request, no recent appt and no future appt, please advise

## 2012-10-30 NOTE — Telephone Encounter (Signed)
Please schedule f/u and refill until then, thanks

## 2012-11-02 NOTE — Telephone Encounter (Signed)
Electronic refill request, no recent appt/future appt, please advise

## 2012-11-02 NOTE — Telephone Encounter (Signed)
Please schedule f/u and refill until then  

## 2012-11-02 NOTE — Telephone Encounter (Signed)
Left voicemail requesting pt to call office 

## 2012-11-06 ENCOUNTER — Other Ambulatory Visit: Payer: Self-pay

## 2012-11-06 MED ORDER — LISINOPRIL-HYDROCHLOROTHIAZIDE 10-12.5 MG PO TABS: 1.0000 | ORAL_TABLET | Freq: Every day | ORAL | Status: DC

## 2012-11-06 NOTE — Telephone Encounter (Signed)
Pt request refill lisinopril hctz to [facility] [address] pt scheduled cpx 12/14/12. Refill done.

## 2012-11-09 NOTE — Telephone Encounter (Signed)
appt scheduled and meds were refilled

## 2012-12-07 ENCOUNTER — Telehealth: Payer: Self-pay | Admitting: Family Medicine

## 2012-12-07 DIAGNOSIS — E119 Type 2 diabetes mellitus without complications: Secondary | ICD-10-CM

## 2012-12-07 DIAGNOSIS — Z125 Encounter for screening for malignant neoplasm of prostate: Secondary | ICD-10-CM | POA: Insufficient documentation

## 2012-12-07 DIAGNOSIS — Z0000 Encounter for general adult medical examination without abnormal findings: Secondary | ICD-10-CM

## 2012-12-07 NOTE — Telephone Encounter (Signed)
Message copied by Abner Greenspan on Mon Dec 07, 2012  9:31 PM ------      Message from: Ellamae Sia      Created: Tue Dec 01, 2012 11:04 AM      Regarding: Lab orders for Tuesday, 7.22.14       Patient is scheduled for CPX labs, please order future labs, Thanks , Terri       ------

## 2012-12-08 ENCOUNTER — Other Ambulatory Visit (INDEPENDENT_AMBULATORY_CARE_PROVIDER_SITE_OTHER): Payer: Managed Care, Other (non HMO)

## 2012-12-08 DIAGNOSIS — Z0000 Encounter for general adult medical examination without abnormal findings: Secondary | ICD-10-CM

## 2012-12-08 DIAGNOSIS — E119 Type 2 diabetes mellitus without complications: Secondary | ICD-10-CM

## 2012-12-08 DIAGNOSIS — Z125 Encounter for screening for malignant neoplasm of prostate: Secondary | ICD-10-CM

## 2012-12-08 LAB — COMPREHENSIVE METABOLIC PANEL
AST: 54 U/L — ABNORMAL HIGH (ref 0–37)
Alkaline Phosphatase: 94 U/L (ref 39–117)
BUN: 15 mg/dL (ref 6–23)
Glucose, Bld: 168 mg/dL — ABNORMAL HIGH (ref 70–99)
Potassium: 4.6 mEq/L (ref 3.5–5.1)
Sodium: 137 mEq/L (ref 135–145)
Total Bilirubin: 0.6 mg/dL (ref 0.3–1.2)

## 2012-12-08 LAB — CBC WITH DIFFERENTIAL/PLATELET
Eosinophils Absolute: 0.6 10*3/uL (ref 0.0–0.7)
Eosinophils Relative: 8.6 % — ABNORMAL HIGH (ref 0.0–5.0)
HCT: 44.8 % (ref 39.0–52.0)
Lymphs Abs: 1.8 10*3/uL (ref 0.7–4.0)
MCHC: 34.5 g/dL (ref 30.0–36.0)
MCV: 100.4 fl — ABNORMAL HIGH (ref 78.0–100.0)
Monocytes Absolute: 0.6 10*3/uL (ref 0.1–1.0)
Neutrophils Relative %: 57.2 % (ref 43.0–77.0)
Platelets: 242 10*3/uL (ref 150.0–400.0)
WBC: 7.4 10*3/uL (ref 4.5–10.5)

## 2012-12-08 LAB — LIPID PANEL
Cholesterol: 251 mg/dL — ABNORMAL HIGH (ref 0–200)
HDL: 53.8 mg/dL (ref >39.00)
Total CHOL/HDL Ratio: 5
VLDL: 26.4 mg/dL (ref 0.0–40.0)

## 2012-12-08 LAB — HEMOGLOBIN A1C: Hgb A1c MFr Bld: 7.9 % — ABNORMAL HIGH (ref 4.6–6.5)

## 2012-12-14 ENCOUNTER — Ambulatory Visit (INDEPENDENT_AMBULATORY_CARE_PROVIDER_SITE_OTHER): Payer: Managed Care, Other (non HMO) | Admitting: Family Medicine

## 2012-12-14 ENCOUNTER — Encounter: Payer: Self-pay | Admitting: Family Medicine

## 2012-12-14 VITALS — BP 140/86 | HR 95 | Temp 98.8°F | Ht 65.5 in | Wt 223.0 lb

## 2012-12-14 DIAGNOSIS — E785 Hyperlipidemia, unspecified: Secondary | ICD-10-CM

## 2012-12-14 DIAGNOSIS — Z8719 Personal history of other diseases of the digestive system: Secondary | ICD-10-CM

## 2012-12-14 DIAGNOSIS — Z1211 Encounter for screening for malignant neoplasm of colon: Secondary | ICD-10-CM

## 2012-12-14 DIAGNOSIS — E669 Obesity, unspecified: Secondary | ICD-10-CM

## 2012-12-14 DIAGNOSIS — Z125 Encounter for screening for malignant neoplasm of prostate: Secondary | ICD-10-CM

## 2012-12-14 DIAGNOSIS — I10 Essential (primary) hypertension: Secondary | ICD-10-CM

## 2012-12-14 DIAGNOSIS — E119 Type 2 diabetes mellitus without complications: Secondary | ICD-10-CM

## 2012-12-14 DIAGNOSIS — Z0000 Encounter for general adult medical examination without abnormal findings: Secondary | ICD-10-CM

## 2012-12-14 MED ORDER — LISINOPRIL-HYDROCHLOROTHIAZIDE 10-12.5 MG PO TABS: 1.0000 | ORAL_TABLET | Freq: Every day | ORAL | Status: DC

## 2012-12-14 MED ORDER — SIMVASTATIN 20 MG PO TABS: 20.0000 mg | ORAL_TABLET | Freq: Every day | ORAL | Status: DC

## 2012-12-14 MED ORDER — GLIPIZIDE ER 10 MG PO TB24: 10.0000 mg | ORAL_TABLET | Freq: Every day | ORAL | Status: DC

## 2012-12-14 NOTE — Assessment & Plan Note (Signed)
Discussed how this problem influences overall health and the risks it imposes  Reviewed plan for weight loss with lower calorie diet (via better food choices and also portion control or program like weight watchers) and exercise building up to or more than 30 minutes 5 days per week including some aerobic activity    

## 2012-12-14 NOTE — Assessment & Plan Note (Signed)
Ast/alt are slt down Disc imp of alcohol cessation and wt loss with fatty liver  Re check at 3 mo f/u

## 2012-12-14 NOTE — Assessment & Plan Note (Signed)
Stable psa No symptoms No change in exam

## 2012-12-14 NOTE — Assessment & Plan Note (Signed)
A1c is up  Lab Results  Component Value Date   HGBA1C 7.9* 12/08/2012    Pt intol of metformin Will inc glipizide XL to 10 mg Rev DM diet in detail  Enc to schedule opthy exam  Also continue to treat tinea pedis

## 2012-12-14 NOTE — Patient Instructions (Addendum)
We will refer you for screening colonoscopy at check out  Don't forget to make your yearly eye exam appt  Stick to a diabetic diet and try to exercise 5 days per week Get rid of alcohol  Start back on cholesterol medicine Increase diabetes medicine to 10 mg of glipizide xl daily and start checking sugars again - update me if any problems  Follow up in 3 months with labs prior

## 2012-12-14 NOTE — Assessment & Plan Note (Signed)
bp in fair control at this time  No changes needed  Disc lifstyle change with low sodium diet and exercise  Refill done

## 2012-12-14 NOTE — Assessment & Plan Note (Signed)
Disc goals for lipids and reasons to control them Rev labs with pt Rev low sat fat diet in detail  Will re start simvastatin - to get to goal of LDL under 100 Re check 3 mo and f/u

## 2012-12-14 NOTE — Assessment & Plan Note (Signed)
Reviewed health habits including diet and exercise and skin cancer prevention Also reviewed health mt list, fam hx and immunizations   Rev wellness labs in detail  Ref for screening colonoscopy

## 2012-12-14 NOTE — Assessment & Plan Note (Signed)
Ref for screening colonoscopy

## 2012-12-14 NOTE — Progress Notes (Signed)
Subjective:    Patient ID: Zachary Lewis, male    DOB: 05/17/1961, 52 y.o.   MRN: [id number]  HPI Here for health maintenance exam and to review chronic medical problems   Having a good summer   Wt is up 5 lb with bmi of 36 Does feel like he has gained  He exercises 3-4 times per week - treadmill and bike at least 45 min  Eating well - is sticking to DM diet he thinks   Flu shot 9/13 Td 12/09 Pneumonia vaccine 6/13  Colon cancer screen- has not had a colonoscopy   Prostate ca screen No family hx of prostate cancer No problems urinating  Lab Results  Component Value Date   PSA 0.40 12/08/2012   PSA 0.41 06/10/2008    Stable overall   Diabetes Home sugar results -has not checked in 2 mo (was ok before then)  DM diet - has had too much carbohydrate - potato , and too much beer  Exercise good  Symptoms-none  A1C last  Lab Results  Component Value Date   HGBA1C 7.9* 12/08/2012  up from 7.2  No problems with medications  Renal protection on ace  Last eye exam - last summer   bp is stable today  No cp or palpitations or headaches or edema  No side effects to medicines  BP Readings from Last 3 Encounters:  12/14/12 140/86  02/17/12 130/85  10/30/11 144/78      Hyperlipidemia Lab Results  Component Value Date   CHOL 251* 12/08/2012   CHOL 272* 02/17/2012   CHOL 253* 10/30/2011   Lab Results  Component Value Date   HDL 53.80 12/08/2012   HDL 52.40 02/17/2012   HDL 55.60 10/30/2011   Lab Results  Component Value Date   LDLCALC 100* 01/02/2010   LDLCALC 122* 09/19/2009   LDLCALC 107* 01/21/2008   Lab Results  Component Value Date   TRIG 132.0 12/08/2012   TRIG 135.0 02/17/2012   TRIG 123.0 10/30/2011   Lab Results  Component Value Date   CHOLHDL 5 12/08/2012   CHOLHDL 5 02/17/2012   CHOLHDL 5 10/30/2011   Lab Results  Component Value Date   LDLDIRECT 176.9 12/08/2012   LDLDIRECT 195.5 02/17/2012   LDLDIRECT 178.4 10/30/2011   avoiding fats in diet Is not  taking simvastatin - ran out and did not refill it ? Why     Hx of fatty liver Lab Results  Component Value Date   ALT 60* 12/08/2012   AST 54* 12/08/2012   ALKPHOS 94 12/08/2012   BILITOT 0.6 12/08/2012   slt imp   Patient Active Problem List   Diagnosis Date Noted  . Prostate cancer screening 12/07/2012  . Routine general medical examination at a health care facility 08/23/2010  . HYPERTENSION, BENIGN ESSENTIAL 01/05/2010  . BACK PAIN, LUMBAR, WITH RADICULOPATHY 09/13/2008  . OBESITY 09/08/2007  . TINEA PEDIS 07/16/2007  . DIABETES MELLITUS, TYPE II 08/13/2006  . HYPERLIPIDEMIA 08/13/2006  . ALLERGY 08/13/2006  . FATTY LIVER DISEASE, HX OF 08/13/2006  . TOBACCO USE, QUIT 08/13/2006   Past Medical History  Diagnosis Date  . Diabetes mellitus     Type II  . Hyperlipidemia   . Hypertension   . Obesity   . Tinea pedis     Chronic   Past Surgical History  Procedure Laterality Date  . Vasectomy  07/2002  . Umbilical hernia repair  01/1997  . Myringectomy  06/2005   History  Substance  Use Topics  . Smoking status: Former Smoker    Quit date: 04/20/2007  . Smokeless tobacco: Never Used     Comment: Started back very seldom, 4 cig per week.  . Alcohol Use: Yes     Comment: occassionally   Family History  Problem Relation Age of Onset  . Diabetes Neg Hx   . Heart disease Neg Hx    Allergies  Allergen Reactions  . Metformin And Related     diarrhea   Current Outpatient Prescriptions on File Prior to Visit  Medication Sig Dispense Refill  . Blood Glucose Monitoring Suppl (BLOOD GLUCOSE METER) kit To check glucose once daily and as needed for diabetes  1 each  0  . glipiZIDE (GLUCOTROL XL) 5 MG 24 hr tablet Take 1 tablet (5 mg total) by mouth daily.  30 tablet  3  . Lancets MISC To check glucose once daily and as needed for diabetes  10 each  3  . lisinopril-hydrochlorothiazide (PRINZIDE,ZESTORETIC) 10-12.5 MG per tablet Take 1 tablet by mouth daily.  30 tablet  1   . simvastatin (ZOCOR) 20 MG tablet Take 1 tablet (20 mg total) by mouth at bedtime.  30 tablet  11   No current facility-administered medications on file prior to visit.    Review of Systems Review of Systems  Constitutional: Negative for fever, appetite change, fatigue and unexpected weight change.  Eyes: Negative for pain and visual disturbance.  Respiratory: Negative for cough and shortness of breath.   Cardiovascular: Negative for cp or palpitations    Gastrointestinal: Negative for nausea, diarrhea and constipation.  Genitourinary: Negative for urgency and frequency.  Skin: Negative for pallor or rash   Neurological: Negative for weakness, light-headedness, numbness and headaches.  Hematological: Negative for adenopathy. Does not bruise/bleed easily.  Psychiatric/Behavioral: Negative for dysphoric mood. The patient is not nervous/anxious.         Objective:   Physical Exam  Constitutional: He appears well-developed and well-nourished. No distress.  obese and well appearing   HENT:  Head: Normocephalic and atraumatic.  Right Ear: External ear normal.  Left Ear: External ear normal.  Nose: Nose normal.  Mouth/Throat: Oropharynx is clear and moist.  Eyes: Conjunctivae and EOM are normal. Pupils are equal, round, and reactive to light. Right eye exhibits no discharge. Left eye exhibits no discharge. No scleral icterus.  Neck: Normal range of motion. Neck supple. No JVD present. Carotid bruit is not present. No thyromegaly present.  Cardiovascular: Normal rate, regular rhythm, normal heart sounds and intact distal pulses.  Exam reveals no gallop.   Pulmonary/Chest: Breath sounds normal. No respiratory distress. He has no wheezes. He has no rales.  Abdominal: Soft. Bowel sounds are normal. He exhibits no distension, no abdominal bruit and no mass. There is no tenderness.  Genitourinary: Rectum normal and prostate normal.  Musculoskeletal: Normal range of motion. He exhibits no  edema and no tenderness.  Lymphadenopathy:    He has no cervical adenopathy.  Neurological: He is alert. He has normal reflexes. No cranial nerve deficit. He exhibits normal muscle tone. Coordination normal.  Skin: Skin is warm and dry. No rash noted. No erythema. No pallor.  Tinea pedis is improved   Psychiatric: He has a normal mood and affect.          Assessment & Plan:

## 2012-12-16 ENCOUNTER — Encounter: Payer: Self-pay | Admitting: Internal Medicine

## 2013-02-16 ENCOUNTER — Telehealth: Payer: Self-pay

## 2013-02-16 NOTE — Telephone Encounter (Signed)
Pt cannot find copy of 12/08/12 labs and request copy of lab results for insurance premium. Pt will pick up copy today. 12/08/12 lab results printed and at front desk for pick up.

## 2013-02-25 ENCOUNTER — Ambulatory Visit (AMBULATORY_SURGERY_CENTER): Payer: Self-pay | Admitting: *Deleted

## 2013-02-25 VITALS — Ht 66.0 in | Wt 227.6 lb

## 2013-02-25 DIAGNOSIS — Z1211 Encounter for screening for malignant neoplasm of colon: Secondary | ICD-10-CM

## 2013-02-25 MED ORDER — NA SULFATE-K SULFATE-MG SULF 17.5-3.13-1.6 GM/177ML PO SOLN: 1.0000 | Freq: Once | ORAL | Status: DC

## 2013-02-25 NOTE — Progress Notes (Signed)
No egg or soy allergy. No anesthesia problems.  

## 2013-02-26 ENCOUNTER — Encounter: Payer: Self-pay | Admitting: Internal Medicine

## 2013-03-09 ENCOUNTER — Other Ambulatory Visit: Payer: Managed Care, Other (non HMO)

## 2013-03-09 DIAGNOSIS — Z860100 Personal history of colon polyps, unspecified: Secondary | ICD-10-CM

## 2013-03-09 DIAGNOSIS — Z860101 Personal history of adenomatous and serrated colon polyps: Secondary | ICD-10-CM | POA: Insufficient documentation

## 2013-03-09 HISTORY — DX: Personal history of colon polyps, unspecified: Z86.0100

## 2013-03-09 HISTORY — DX: Personal history of colonic polyps: Z86.010

## 2013-03-11 ENCOUNTER — Encounter: Payer: Self-pay | Admitting: Internal Medicine

## 2013-03-11 ENCOUNTER — Ambulatory Visit (AMBULATORY_SURGERY_CENTER): Payer: Managed Care, Other (non HMO) | Admitting: Internal Medicine

## 2013-03-11 VITALS — BP 136/96 | HR 82 | Temp 97.6°F | Resp 14 | Ht 66.0 in | Wt 227.0 lb

## 2013-03-11 DIAGNOSIS — Z1211 Encounter for screening for malignant neoplasm of colon: Secondary | ICD-10-CM

## 2013-03-11 DIAGNOSIS — D126 Benign neoplasm of colon, unspecified: Secondary | ICD-10-CM

## 2013-03-11 DIAGNOSIS — K5730 Diverticulosis of large intestine without perforation or abscess without bleeding: Secondary | ICD-10-CM

## 2013-03-11 LAB — GLUCOSE, CAPILLARY: Glucose-Capillary: 165 mg/dL — ABNORMAL HIGH (ref 70–99)

## 2013-03-11 MED ORDER — SODIUM CHLORIDE 0.9 % IV SOLN: 500.0000 mL | INTRAVENOUS | Status: DC

## 2013-03-11 NOTE — Op Note (Signed)
[facility]  [address] [place], [id number]   COLONOSCOPY PROCEDURE REPORT  PATIENT: Zachary, Lewis  MR#: [id number] BIRTHDATE: 04-19-1961 , 52  yrs. old GENDER: Male ENDOSCOPIST: Gatha Mayer, MD, [facility] REFERRED UY:EBXID Vernell Morgans, M.D. PROCEDURE DATE:  03/11/2013 PROCEDURE:   Colonoscopy with biopsy and snare polypectomy First Screening Colonoscopy - Avg.  risk and is 50 yrs.  old or older Yes.  Prior Negative Screening - Now for repeat screening. N/A  History of Adenoma - Now for follow-up colonoscopy & has been > or = to 3 yrs.  N/A  Polyps Removed Today? Yes. ASA CLASS:   Class III INDICATIONS:average risk screening and first colonoscopy. MEDICATIONS: Propofol (Diprivan) 230 mg IV, MAC sedation, administered by CRNA, and These medications were titrated to patient response per physician's verbal order  DESCRIPTION OF PROCEDURE:   After the risks benefits and alternatives of the procedure were thoroughly explained, informed consent was obtained.  A digital rectal exam revealed no abnormalities of the rectum, A digital rectal exam revealed no prostatic nodules, and A digital rectal exam revealed the prostate was not enlarged.   The LB [id number]  endoscope was introduced through the anus and advanced to the cecum, which was identified by both the appendix and ileocecal valve. No adverse events experienced.   The quality of the prep was excellent using Suprep  The instrument was then slowly withdrawn as the colon was fully examined.  COLON FINDINGS: Five sessile polyps measuring 2-7 mm in size were found at the ileocecal valve, in the transverse colon, and descending colon.  A polypectomy was performed with cold forceps (IC valve, transverse x 1 and descending) and with a cold snare 92 transverse).  The resection was complete and the polyp tissue was completely retrieved.   Moderate diverticulosis was noted.   The colon mucosa was otherwise normal.   Retroflexed views revealed no abnormalities. The time to cecum=2 minutes 15 seconds.  Withdrawal time=15 minutes 51 seconds.  The scope was withdrawn and the procedure completed. COMPLICATIONS: There were no complications.  ENDOSCOPIC IMPRESSION: 1.   Five sessile polyps measuring 2-7 mm in size were found at the ileocecal valve, in the transverse colon, and descending colon; polypectomy was performed with cold forceps and with a cold snare 2.   Moderate diverticulosis was noted 3.   The colon mucosa was otherwise normal - excellent prep - first colonoscopy  RECOMMENDATIONS: Timing of repeat colonoscopy will be determined by pathology findings.  eSigned:  Gatha Mayer, MD, [facility] 03/11/2013 9:21 AM   cc: Abner Greenspan, MD and The Patient

## 2013-03-11 NOTE — Progress Notes (Signed)
Patient did not experience any of the following events: a burn prior to discharge; a fall within the facility; wrong site/side/patient/procedure/implant event; or a hospital transfer or hospital admission upon discharge from the facility. (G8907) Patient did not have preoperative order for IV antibiotic SSI prophylaxis. (G8918)  

## 2013-03-11 NOTE — Progress Notes (Signed)
Report to pacu rn, vss, bbs=clear 

## 2013-03-11 NOTE — Patient Instructions (Addendum)
I found and removed 5 polyps that all look benign. You also have diverticulosis - not usually a problem. Please read the handout.  I will let you know pathology results and when to have another routine colonoscopy by mail.  It is the time of year to have a vaccination to prevent the flu (influenza virus). Please have this done through your primary care provider or you can get this done at local pharmacies or the [facility]. It would be very helpful if you notify your primary care provider when and where you had the vaccination given by messaging them in My Chart, leaving a message or faxing the information.  I appreciate the opportunity to care for you. Gatha Mayer, MD, FACG  YOU HAD AN ENDOSCOPIC PROCEDURE TODAY AT [facility]: Refer to the procedure report that was given to you for any specific questions about what was found during the examination.  If the procedure report does not answer your questions, please call your gastroenterologist to clarify.  If you requested that your care partner not be given the details of your procedure findings, then the procedure report has been included in a sealed envelope for you to review at your convenience later.  YOU SHOULD EXPECT: Some feelings of bloating in the abdomen. Passage of more gas than usual.  Walking can help get rid of the air that was put into your GI tract during the procedure and reduce the bloating. If you had a lower endoscopy (such as a colonoscopy or flexible sigmoidoscopy) you may notice spotting of blood in your stool or on the toilet paper. If you underwent a bowel prep for your procedure, then you may not have a normal bowel movement for a few days.  DIET: Your first meal following the procedure should be a light meal and then it is ok to progress to your normal diet.  A half-sandwich or bowl of soup is an example of a good first meal.  Heavy or fried foods are harder to digest and may make you feel nauseous or  bloated.  Likewise meals heavy in dairy and vegetables can cause extra gas to form and this can also increase the bloating.  Drink plenty of fluids but you should avoid alcoholic beverages for 24 hours.  ACTIVITY: Your care partner should take you home directly after the procedure.  You should plan to take it easy, moving slowly for the rest of the day.  You can resume normal activity the day after the procedure however you should NOT DRIVE or use heavy machinery for 24 hours (because of the sedation medicines used during the test).    SYMPTOMS TO REPORT IMMEDIATELY: A gastroenterologist can be reached at any hour.  During normal business hours, 8:30 AM to 5:00 PM Monday through Friday, call [phone].  After hours and on weekends, please call the GI answering service at [phone] who will take a message and have the physician on call contact you.   Following lower endoscopy (colonoscopy or flexible sigmoidoscopy):  Excessive amounts of blood in the stool  Significant tenderness or worsening of abdominal pains  Swelling of the abdomen that is new, acute  Fever of 100F or higher  FOLLOW UP: If any biopsies were taken you will be contacted by phone or by letter within the next 1-3 weeks.  Call your gastroenterologist if you have not heard about the biopsies in 3 weeks.  Our staff will call the home number listed on your  records the next business day following your procedure to check on you and address any questions or concerns that you may have at that time regarding the information given to you following your procedure. This is a courtesy call and so if there is no answer at the home number and we have not heard from you through the emergency physician on call, we will assume that you have returned to your regular daily activities without incident.  SIGNATURES/CONFIDENTIALITY: You and/or your care partner have signed paperwork which will be entered into your electronic medical record.   These signatures attest to the fact that that the information above on your After Visit Summary has been reviewed and is understood.  Full responsibility of the confidentiality of this discharge information lies with you and/or your care-partner.

## 2013-03-11 NOTE — Progress Notes (Signed)
Called to room to assist during endoscopic procedure.  Patient ID and intended procedure confirmed with present staff. Received instructions for my participation in the procedure from the performing physician. ewm 

## 2013-03-12 ENCOUNTER — Telehealth: Payer: Self-pay | Admitting: *Deleted

## 2013-03-12 NOTE — Telephone Encounter (Signed)
  Follow up Call-  Call back number 03/11/2013  Post procedure Call Back phone  # [phone]  Permission to leave phone message Yes     Patient questions:  Do you have a fever, pain , or abdominal swelling? no Pain Score  0 *  Have you tolerated food without any problems? yes  Have you been able to return to your normal activities? yes  Do you have any questions about your discharge instructions: Diet   no Medications  no Follow up visit  no  Do you have questions or concerns about your Care? no  Actions: * If pain score is 4 or above: No action needed, pain <4.

## 2013-03-16 ENCOUNTER — Ambulatory Visit: Payer: Managed Care, Other (non HMO) | Admitting: Family Medicine

## 2013-03-20 ENCOUNTER — Encounter: Payer: Self-pay | Admitting: Internal Medicine

## 2013-03-20 NOTE — Progress Notes (Signed)
Quick Note:  5 tubular adenomas max 7 mm Repeat colonoscopy 2017 ______

## 2013-05-21 ENCOUNTER — Ambulatory Visit: Payer: Managed Care, Other (non HMO) | Admitting: Emergency Medicine

## 2013-05-21 VITALS — BP 142/90 | HR 85 | Temp 98.2°F | Resp 18 | Ht 66.0 in | Wt 220.0 lb

## 2013-05-21 DIAGNOSIS — S335XXA Sprain of ligaments of lumbar spine, initial encounter: Secondary | ICD-10-CM

## 2013-05-21 MED ORDER — NAPROXEN SODIUM 550 MG PO TABS: 550.0000 mg | ORAL_TABLET | Freq: Two times a day (BID) | ORAL | Status: AC

## 2013-05-21 MED ORDER — TRAMADOL HCL 50 MG PO TABS: 50.0000 mg | ORAL_TABLET | Freq: Three times a day (TID) | ORAL | Status: DC | PRN

## 2013-05-21 MED ORDER — CYCLOBENZAPRINE HCL 10 MG PO TABS: 10.0000 mg | ORAL_TABLET | Freq: Three times a day (TID) | ORAL | Status: DC | PRN

## 2013-05-21 NOTE — Progress Notes (Signed)
[facility] [address] [place] [address] [phone]  Date:  05/21/2013   Name:  Zachary Lewis   DOB:  1960/12/17   MRN:  [id number]  PCP:  Loura Pardon, MD    Chief Complaint: Back Pain   History of Present Illness:  Zachary Lewis is a 53 y.o. very pleasant male patient who presents with the following:  Works at [employer] and has one week duration of right lower back pain with no history of discrete injury.  No pain radiation.  No neuro symptoms.  History of prior sciatic neuritis but this is different.  No improvement with over the counter medications or other home remedies.   Patient Active Problem List   Diagnosis Date Noted  . Personal history of colonic adenomas 03/09/2013  . Prostate cancer screening 12/07/2012  . Routine general medical examination at a health care facility 08/23/2010  . HYPERTENSION, BENIGN ESSENTIAL 01/05/2010  . BACK PAIN, LUMBAR, WITH RADICULOPATHY 09/13/2008  . OBESITY 09/08/2007  . TINEA PEDIS 07/16/2007  . DIABETES MELLITUS, TYPE II 08/13/2006  . HYPERLIPIDEMIA 08/13/2006  . ALLERGY 08/13/2006  . FATTY LIVER DISEASE, HX OF 08/13/2006  . TOBACCO USE, QUIT 08/13/2006    Past Medical History  Diagnosis Date  . Diabetes mellitus     Type II  . Hyperlipidemia   . Hypertension   . Obesity   . Tinea pedis     Chronic  . Personal history of colonic adenomas 03/09/2013    Past Surgical History  Procedure Laterality Date  . Vasectomy  07/2002  . Umbilical hernia repair  01/1997  . Myringectomy  06/2005    History  Substance Use Topics  . Smoking status: Former Smoker    Quit date: 04/20/2007  . Smokeless tobacco: Never Used     Comment: Started back very seldom, 4 cig per week.  . Alcohol Use: Yes     Comment: occassionally    Family History  Problem Relation Age of Onset  . Diabetes Neg Hx   . Heart disease Neg Hx   . Colon cancer Neg Hx     Allergies  Allergen Reactions  . Metformin  And Related     diarrhea    Medication list has been reviewed and updated.  Current Outpatient Prescriptions on File Prior to Visit  Medication Sig Dispense Refill  . Blood Glucose Monitoring Suppl (BLOOD GLUCOSE METER) kit To check glucose once daily and as needed for diabetes  1 each  0  . glipiZIDE (GLIPIZIDE XL) 10 MG 24 hr tablet Take 1 tablet (10 mg total) by mouth daily.  30 tablet  11  . Lancets MISC To check glucose once daily and as needed for diabetes  10 each  3  . lisinopril-hydrochlorothiazide (PRINZIDE,ZESTORETIC) 10-12.5 MG per tablet Take 1 tablet by mouth daily.  30 tablet  11  . simvastatin (ZOCOR) 20 MG tablet Take 1 tablet (20 mg total) by mouth at bedtime.  30 tablet  11   No current facility-administered medications on file prior to visit.    Review of Systems:  As per HPI, otherwise negative.    Physical Examination: Filed Vitals:   05/21/13 1745  BP: 142/90  Pulse: 85  Temp: 98.2 F (36.8 C)  Resp: 18   Filed Vitals:   05/21/13 1745  Height: 5' 6"  (1.676 m)  Weight: 220 lb (99.791 kg)   Body mass index is 35.53 kg/(m^2). Ideal Body Weight: Weight  in (lb) to have BMI = 25: 154.6   GEN: WDWN, NAD, Non-toxic, Alert & Oriented x 3 HEENT: Atraumatic, Normocephalic.  Ears and Nose: No external deformity. EXTR: No clubbing/cyanosis/edema NEURO: anltalgic gait.  PSYCH: Normally interactive. Conversant. Not depressed or anxious appearing.  Calm demeanor.  BACK  Tender right lumbar paraspinous muscles  Neuro grossly intact  Assessment and Plan: Lumbar strain Anaprox Flexeril Ultram   Signed,  Ellison Carwin, MD

## 2013-05-21 NOTE — Patient Instructions (Signed)
Lumbosacral Strain Lumbosacral strain is one of the most common causes of back pain. There are many causes of back pain. Most are not serious conditions. CAUSES  Your backbone (spinal column) is made up of 24 main vertebral bodies, the sacrum, and the coccyx. These are held together by muscles and tough, fibrous tissue (ligaments). Nerve roots pass through the openings between the vertebrae. A sudden move or injury to the back may cause injury to, or pressure on, these nerves. This may result in localized back pain or pain movement (radiation) into the buttocks, down the leg, and into the foot. Sharp, shooting pain from the buttock down the back of the leg (sciatica) is frequently associated with a ruptured (herniated) disk. Pain may be caused by muscle spasm alone. Your caregiver can often find the cause of your pain by the details of your symptoms and an exam. In some cases, you may need tests (such as X-rays). Your caregiver will work with you to decide if any tests are needed based on your specific exam. HOME CARE INSTRUCTIONS   Avoid an underactive lifestyle. Active exercise, as directed by your caregiver, is your greatest weapon against back pain.  Avoid hard physical activities (tennis, racquetball, waterskiing) if you are not in proper physical condition for it. This may aggravate or create problems.  If you have a back problem, avoid sports requiring sudden body movements. Swimming and walking are generally safer activities.  Maintain good posture.  Avoid becoming overweight (obese).  Use bed rest for only the most extreme, sudden (acute) episode. Your caregiver will help you determine how much bed rest is necessary.  For acute conditions, you may put ice on the injured area.  Put ice in a plastic bag.  Place a towel between your skin and the bag.  Leave the ice on for 15-20 minutes at a time, every 2 hours, or as needed.  After you are improved and more active, it may help to  apply heat for 30 minutes before activities. See your caregiver if you are having pain that lasts longer than expected. Your caregiver can advise appropriate exercises or therapy if needed. With conditioning, most back problems can be avoided. SEEK IMMEDIATE MEDICAL CARE IF:   You have numbness, tingling, weakness, or problems with the use of your arms or legs.  You experience severe back pain not relieved with medicines.  There is a change in bowel or bladder control.  You have increasing pain in any area of the body, including your belly (abdomen).  You notice shortness of breath, dizziness, or feel faint.  You feel sick to your stomach (nauseous), are throwing up (vomiting), or become sweaty.  You notice discoloration of your toes or legs, or your feet get very cold.  Your back pain is getting worse.  You have a fever. MAKE SURE YOU:   Understand these instructions.  Will watch your condition.  Will get help right away if you are not doing well or get worse. Document Released: 02/13/2005 Document Revised: 07/29/2011 Document Reviewed: 08/05/2008 [facility] Patient Information 2014 [place], [place].

## 2013-05-28 ENCOUNTER — Other Ambulatory Visit: Payer: Managed Care, Other (non HMO)

## 2013-05-31 ENCOUNTER — Ambulatory Visit: Payer: Managed Care, Other (non HMO) | Admitting: Family Medicine

## 2013-06-21 ENCOUNTER — Other Ambulatory Visit: Payer: Managed Care, Other (non HMO)

## 2013-06-28 ENCOUNTER — Ambulatory Visit: Payer: Managed Care, Other (non HMO) | Admitting: Family Medicine

## 2013-12-03 ENCOUNTER — Ambulatory Visit: Payer: Managed Care, Other (non HMO) | Admitting: Family Medicine

## 2014-01-04 ENCOUNTER — Ambulatory Visit (INDEPENDENT_AMBULATORY_CARE_PROVIDER_SITE_OTHER): Payer: Managed Care, Other (non HMO) | Admitting: Family Medicine

## 2014-01-04 ENCOUNTER — Encounter: Payer: Self-pay | Admitting: Family Medicine

## 2014-01-04 VITALS — BP 142/96 | HR 80 | Temp 98.4°F | Ht 66.0 in | Wt 223.2 lb

## 2014-01-04 DIAGNOSIS — E669 Obesity, unspecified: Secondary | ICD-10-CM

## 2014-01-04 DIAGNOSIS — Z720 Tobacco use: Secondary | ICD-10-CM | POA: Insufficient documentation

## 2014-01-04 DIAGNOSIS — I10 Essential (primary) hypertension: Secondary | ICD-10-CM

## 2014-01-04 DIAGNOSIS — E119 Type 2 diabetes mellitus without complications: Secondary | ICD-10-CM

## 2014-01-04 DIAGNOSIS — F17200 Nicotine dependence, unspecified, uncomplicated: Secondary | ICD-10-CM

## 2014-01-04 DIAGNOSIS — E785 Hyperlipidemia, unspecified: Secondary | ICD-10-CM

## 2014-01-04 DIAGNOSIS — Z87891 Personal history of nicotine dependence: Secondary | ICD-10-CM | POA: Insufficient documentation

## 2014-01-04 MED ORDER — GLIPIZIDE ER 10 MG PO TB24: 10.0000 mg | ORAL_TABLET | Freq: Every day | ORAL | Status: DC

## 2014-01-04 MED ORDER — LISINOPRIL-HYDROCHLOROTHIAZIDE 10-12.5 MG PO TABS: 1.0000 | ORAL_TABLET | Freq: Every day | ORAL | Status: DC

## 2014-01-04 MED ORDER — SIMVASTATIN 20 MG PO TABS
20.0000 mg | ORAL_TABLET | Freq: Every day | ORAL | Status: DC
Start: 2014-01-04 — End: 2014-11-18

## 2014-01-04 NOTE — Assessment & Plan Note (Signed)
Discussed how this problem influences overall health and the risks it imposes  Reviewed plan for weight loss with lower calorie diet (via better food choices and also portion control or program like weight watchers) and exercise building up to or more than 30 minutes 5 days per week including some aerobic activity   Pt states diet and exercise have been better

## 2014-01-04 NOTE — Patient Instructions (Signed)
Blood pressure is high because your were off your medicine for a while  We will hold off on labs due to being off medicine also  To call Zachary Lewis about their smoking cessation program  [phone]  Don't forget to schedule an annual eye exam  Stay on current medicines Keep working diet and exercise and weight loss Follow up in about 1 month with labs prior

## 2014-01-04 NOTE — Progress Notes (Signed)
Pre visit review using our clinic review tool, if applicable. No additional management support is needed unless otherwise documented below in the visit note. 

## 2014-01-04 NOTE — Progress Notes (Signed)
Subjective:    Patient ID: Zachary Lewis, male    DOB: 01-Dec-1960, 53 y.o.   MRN: [id number]  HPI  Here for f/u of chronic health problems Has been non compliant with  f/u and ran out of his medications   Working a lot  On vacation this week   Has been feeling fine   Wt is up 3 lb  bmi is 36- obese   He is exercising 5-6  days per week - uses a home gym and also the treadmill and exercise bike  At least 45 minutes  That keeps him feeling good  Does it in am before work    bp is up today - he ran out of bp pills  No cp or palpitations or headaches or edema  No side effects to medicines  BP Readings from Last 3 Encounters:  01/04/14 142/96  05/21/13 142/90  03/11/13 136/96     Smoking status- is smoking 1 pack for week  He is ready to quit - is interested in the [employer] classes   Hyperlipidemia  Lab Results  Component Value Date   CHOL 251* 12/08/2012   HDL 53.80 12/08/2012   LDLCALC 100* 01/02/2010   LDLDIRECT 176.9 12/08/2012   TRIG 132.0 12/08/2012   CHOLHDL 5 12/08/2012   simvastatin and diet- off statin for a mo- ran out  Stays away from fried foods completely    Diabetes Home sugar results - does not check /no symptoms of low sugar  DM diet - better than the past overall, watches carbs and sugars  Exercise - good  Symptoms A1C last  Lab Results  Component Value Date   HGBA1C 7.9* 12/08/2012    No problems with medications - glipizide (was off that for a month also ) Intol of metformin  Renal protection- on ace  Last eye exam - 8/14 - has not made one yet for this year    Review of Systems    Review of Systems  Constitutional: Negative for fever, appetite change, fatigue and unexpected weight change.  Eyes: Negative for pain and visual disturbance.  Respiratory: Negative for cough and shortness of breath.   Cardiovascular: Negative for cp or palpitations    Gastrointestinal: Negative for nausea, diarrhea and constipation.  Genitourinary:  Negative for urgency and frequency.  Skin: Negative for pallor or rash  pos for athletes foot that is improving  Neurological: Negative for weakness, light-headedness, numbness and headaches.  Hematological: Negative for adenopathy. Does not bruise/bleed easily.  Psychiatric/Behavioral: Negative for dysphoric mood. The patient is not nervous/anxious.       Objective:   Physical Exam  Constitutional: He appears well-developed and well-nourished. No distress.  obese and well appearing   HENT:  Head: Normocephalic and atraumatic.  Right Ear: External ear normal.  Left Ear: External ear normal.  Nose: Nose normal.  Mouth/Throat: Oropharynx is clear and moist.  Eyes: Conjunctivae and EOM are normal. Pupils are equal, round, and reactive to light. Right eye exhibits no discharge. Left eye exhibits no discharge. No scleral icterus.  Neck: Normal range of motion. Neck supple. No JVD present. Carotid bruit is not present. No thyromegaly present.  Cardiovascular: Normal rate, regular rhythm, normal heart sounds and intact distal pulses.  Exam reveals no gallop.   Pulmonary/Chest: Effort normal and breath sounds normal. No respiratory distress. He has no wheezes. He exhibits no tenderness.  Abdominal: Soft. Bowel sounds are normal. He exhibits no distension, no abdominal bruit and no  mass. There is no tenderness.  Obese abdomen  Musculoskeletal: He exhibits no edema and no tenderness.  Lymphadenopathy:    He has no cervical adenopathy.  Neurological: He is alert. He has normal reflexes. No cranial nerve deficit. He exhibits normal muscle tone. Coordination normal.  Skin: Skin is warm and dry. No rash noted. No erythema. No pallor.  Psychiatric: He has a normal mood and affect.          Assessment & Plan:   Problem List Items Addressed This Visit     Cardiovascular and Mediastinum   HYPERTENSION, BENIGN ESSENTIAL - Primary     bp up due to being off med F/u re check 1 mo with lab prior    Rev healthy habits and dash diet     Relevant Medications      simvastatin (ZOCOR) tablet      lisinopril-hydrochlorothiazide (PRINZIDE,ZESTORETIC) 10-12.5 MG per tablet   Other Relevant Orders      CBC with Differential      Comprehensive metabolic panel      TSH      Lipid panel     Endocrine   DIABETES MELLITUS, TYPE II     Overdue for A1C Noncompliant for f/u so out of med for 6mo sched a1c for 1 mo with f/u after  Disc DM diet and exercise He will make his own opthy exam Enc to quit smoking     Relevant Medications      simvastatin (ZOCOR) tablet      lisinopril-hydrochlorothiazide (PRINZIDE,ZESTORETIC) 10-12.5 MG per tablet      glipiZIDE (GLUCOTROL XL) 24 hr tablet   Other Relevant Orders      Hemoglobin A1c     Other   HYPERLIPIDEMIA     Will check lipid in a month since he has been off zocor Rev low sat fat diet-doing better with this     Relevant Medications      simvastatin (ZOCOR) tablet      lisinopril-hydrochlorothiazide (PRINZIDE,ZESTORETIC) 10-12.5 MG per tablet   Other Relevant Orders      Lipid panel   OBESITY     Discussed how this problem influences overall health and the risks it imposes  Reviewed plan for weight loss with lower calorie diet (via better food choices and also portion control or program like weight watchers) and exercise building up to or more than 30 minutes 5 days per week including some aerobic activity   Pt states diet and exercise have been better     Relevant Medications      glipiZIDE (GLUCOTROL XL) 24 hr tablet   Smoking     Disc in detail risks of smoking and possible outcomes including copd, vascular/ heart disease, cancer , respiratory and sinus infections  Pt voices understanding   He desires to quit Given phone # for [employer] [facility] to quit

## 2014-01-04 NOTE — Assessment & Plan Note (Signed)
Overdue for A1C Noncompliant for f/u so out of med for 61mo sched a1c for 1 mo with f/u after  Disc DM diet and exercise He will make his own opthy exam Enc to quit smoking

## 2014-01-04 NOTE — Assessment & Plan Note (Signed)
Will check lipid in a month since he has been off zocor Rev low sat fat diet-doing better with this

## 2014-01-04 NOTE — Assessment & Plan Note (Signed)
bp up due to being off med F/u re check 1 mo with lab prior  Rev healthy habits and dash diet

## 2014-01-04 NOTE — Assessment & Plan Note (Signed)
Disc in detail risks of smoking and possible outcomes including copd, vascular/ heart disease, cancer , respiratory and sinus infections  Pt voices understanding   He desires to quit Given phone # for [employer] [facility] to quit

## 2014-01-05 ENCOUNTER — Telehealth: Payer: Self-pay | Admitting: Family Medicine

## 2014-01-05 NOTE — Telephone Encounter (Signed)
Relevant patient education assigned to patient using Emmi. ° °

## 2014-01-31 ENCOUNTER — Other Ambulatory Visit (INDEPENDENT_AMBULATORY_CARE_PROVIDER_SITE_OTHER): Payer: Managed Care, Other (non HMO)

## 2014-01-31 DIAGNOSIS — E785 Hyperlipidemia, unspecified: Secondary | ICD-10-CM

## 2014-01-31 DIAGNOSIS — I10 Essential (primary) hypertension: Secondary | ICD-10-CM

## 2014-01-31 DIAGNOSIS — E119 Type 2 diabetes mellitus without complications: Secondary | ICD-10-CM

## 2014-01-31 LAB — LIPID PANEL
Cholesterol: 199 mg/dL (ref 0–200)
HDL: 49.7 mg/dL (ref >39.00)
LDL CALC: 130 mg/dL — AB (ref 0–99)
NonHDL: 149.3
TRIGLYCERIDES: 95 mg/dL (ref 0.0–149.0)
Total CHOL/HDL Ratio: 4
VLDL: 19 mg/dL (ref 0.0–40.0)

## 2014-01-31 LAB — CBC WITH DIFFERENTIAL/PLATELET
Basophils Absolute: 0.1 10*3/uL (ref 0.0–0.1)
Basophils Relative: 1.1 % (ref 0.0–3.0)
EOS ABS: 0.4 10*3/uL (ref 0.0–0.7)
Eosinophils Relative: 5.8 % — ABNORMAL HIGH (ref 0.0–5.0)
HEMATOCRIT: 46.7 % (ref 39.0–52.0)
Hemoglobin: 15.7 g/dL (ref 13.0–17.0)
LYMPHS ABS: 2.1 10*3/uL (ref 0.7–4.0)
Lymphocytes Relative: 27.3 % (ref 12.0–46.0)
MCHC: 33.6 g/dL (ref 30.0–36.0)
MCV: 100.5 fl — ABNORMAL HIGH (ref 78.0–100.0)
Monocytes Absolute: 0.7 10*3/uL (ref 0.1–1.0)
Monocytes Relative: 9 % (ref 3.0–12.0)
NEUTROS PCT: 56.8 % (ref 43.0–77.0)
Neutro Abs: 4.4 10*3/uL (ref 1.4–7.7)
Platelets: 255 10*3/uL (ref 150.0–400.0)
RBC: 4.65 Mil/uL (ref 4.22–5.81)
RDW: 14.1 % (ref 11.5–15.5)
WBC: 7.7 10*3/uL (ref 4.0–10.5)

## 2014-01-31 LAB — COMPREHENSIVE METABOLIC PANEL
ALBUMIN: 4.1 g/dL (ref 3.5–5.2)
ALK PHOS: 128 U/L — AB (ref 39–117)
ALT: 85 U/L — ABNORMAL HIGH (ref 0–53)
AST: 54 U/L — AB (ref 0–37)
BUN: 18 mg/dL (ref 6–23)
CO2: 27 mEq/L (ref 19–32)
CREATININE: 1.1 mg/dL (ref 0.4–1.5)
Calcium: 9.6 mg/dL (ref 8.4–10.5)
Chloride: 99 mEq/L (ref 96–112)
GFR: 93.48 mL/min (ref >60.00)
GLUCOSE: 179 mg/dL — AB (ref 70–99)
POTASSIUM: 4.4 meq/L (ref 3.5–5.1)
Sodium: 137 mEq/L (ref 135–145)
Total Bilirubin: 0.5 mg/dL (ref 0.2–1.2)
Total Protein: 8 g/dL (ref 6.0–8.3)

## 2014-01-31 LAB — HEMOGLOBIN A1C: HEMOGLOBIN A1C: 9.3 % — AB (ref 4.6–6.5)

## 2014-01-31 LAB — TSH: TSH: 1.78 u[IU]/mL (ref 0.35–4.50)

## 2014-02-04 ENCOUNTER — Ambulatory Visit (INDEPENDENT_AMBULATORY_CARE_PROVIDER_SITE_OTHER): Payer: Managed Care, Other (non HMO) | Admitting: Family Medicine

## 2014-02-04 ENCOUNTER — Encounter: Payer: Self-pay | Admitting: Family Medicine

## 2014-02-04 VITALS — BP 122/80 | HR 95 | Temp 98.7°F | Ht 66.0 in | Wt 221.2 lb

## 2014-02-04 DIAGNOSIS — E119 Type 2 diabetes mellitus without complications: Secondary | ICD-10-CM

## 2014-02-04 DIAGNOSIS — I10 Essential (primary) hypertension: Secondary | ICD-10-CM

## 2014-02-04 DIAGNOSIS — E785 Hyperlipidemia, unspecified: Secondary | ICD-10-CM

## 2014-02-04 DIAGNOSIS — F17200 Nicotine dependence, unspecified, uncomplicated: Secondary | ICD-10-CM

## 2014-02-04 DIAGNOSIS — Z23 Encounter for immunization: Secondary | ICD-10-CM

## 2014-02-04 MED ORDER — METFORMIN HCL 500 MG PO TABS: 500.0000 mg | ORAL_TABLET | Freq: Two times a day (BID) | ORAL | Status: DC

## 2014-02-04 NOTE — Progress Notes (Signed)
Pre visit review using our clinic review tool, if applicable. No additional management support is needed unless otherwise documented below in the visit note. 

## 2014-02-04 NOTE — Assessment & Plan Note (Signed)
Lab Results  Component Value Date   HGBA1C 9.3* 01/31/2014   Needs to start checking glucose Wants to see if he can tolerate metformin - will try 500 bid  Also rev DM diet  Lab and f/u 3 mo  opthy appt this mo

## 2014-02-04 NOTE — Assessment & Plan Note (Signed)
Disc in detail risks of smoking and possible outcomes including copd, vascular/ heart disease, cancer , respiratory and sinus infections  Pt voices understanding He is almost quit and plans to soon

## 2014-02-04 NOTE — Patient Instructions (Signed)
We need to get your cholesterol down more  Avoid red meat/ fried foods/ egg yolks/ fatty breakfast meats/ butter, cheese and high fat dairy/ and shellfish    For diabetes start back on metformin - and if you have intolerable side effects-please let me know  Continue your current medicines Stick to a diabetic diet and keep exercising   Flu shot today   Follow up in 3 months with labs prior

## 2014-02-04 NOTE — Progress Notes (Signed)
Subjective:    Patient ID: Zachary Lewis, male    DOB: 28-Nov-1960, 53 y.o.   MRN: [id number]  HPI Here for f/u of chronic health problems  Wt is down 2 lb with bmi of 35 Zachary Lewis is working on that - exercising consistently - Zachary Lewis is back on the treadmill 5 days per week and also bike -- for 1 hour Obese   bp is mildly improved today No cp or palpitations or headaches or edema  No side effects to medicines  BP Readings from Last 3 Encounters:  02/04/14 130/94  01/04/14 142/96  05/21/13 142/90    Improved bp after starting back on med   Diabetes Home sugar results - not checking his blood sugars - his sugar was 179 fasting (high)  DM diet - is better -no fried foods , Zachary Lewis is eating diabetic meals and smaller portions  Exercise - much better  Symptoms- none  A1C last  Lab Results  Component Value Date   HGBA1C 9.3* 01/31/2014  this was 7.9 prev  This also reflects one mo of no med    No problems with medications - Zachary Lewis takes glipizide , Zachary Lewis stopped metformin due to diarrhea - Zachary Lewis would like to try it again  Renal protection on ace  Last eye exam - has it planned on 9/22   Hyperlipidemia  On zocor and diet  Lab Results  Component Value Date   CHOL 199 01/31/2014   CHOL 251* 12/08/2012   CHOL 272* 02/17/2012   Lab Results  Component Value Date   HDL 49.70 01/31/2014   HDL 53.80 12/08/2012   HDL 52.40 02/17/2012   Lab Results  Component Value Date   LDLCALC 130* 01/31/2014   LDLCALC 100* 01/02/2010   LDLCALC 122* 09/19/2009   Lab Results  Component Value Date   TRIG 95.0 01/31/2014   TRIG 132.0 12/08/2012   TRIG 135.0 02/17/2012   Lab Results  Component Value Date   CHOLHDL 4 01/31/2014   CHOLHDL 5 12/08/2012   CHOLHDL 5 02/17/2012   Lab Results  Component Value Date   LDLDIRECT 176.9 12/08/2012   LDLDIRECT 195.5 02/17/2012   LDLDIRECT 178.4 10/30/2011   ? If this reflects being off med for a mo before  No fried foods  When Zachary Lewis eats chicken wings - Zachary Lewis bakes them , does eat  some cheese , occ shellfish - but does eat fish with fins   Smoking - has cut down to 3 per day  (had 2 yesterday)- thinks Zachary Lewis can quit very soon / Zachary Lewis does not buy them and will run out soon   Wants flu shot today     Patient Active Problem List   Diagnosis Date Noted  . Smoking 01/04/2014  . Personal history of colonic adenomas 03/09/2013  . Prostate cancer screening 12/07/2012  . Routine general medical examination at a health care facility 08/23/2010  . HYPERTENSION, BENIGN ESSENTIAL 01/05/2010  . BACK PAIN, LUMBAR, WITH RADICULOPATHY 09/13/2008  . OBESITY 09/08/2007  . TINEA PEDIS 07/16/2007  . DIABETES MELLITUS, TYPE II 08/13/2006  . HYPERLIPIDEMIA 08/13/2006  . ALLERGY 08/13/2006  . FATTY LIVER DISEASE, HX OF 08/13/2006  . TOBACCO USE, QUIT 08/13/2006   Past Medical History  Diagnosis Date  . Diabetes mellitus     Type II  . Hyperlipidemia   . Hypertension   . Obesity   . Tinea pedis     Chronic  . Personal history of colonic adenomas 03/09/2013  Past Surgical History  Procedure Laterality Date  . Vasectomy  07/2002  . Umbilical hernia repair  01/1997  . Myringectomy  06/2005   History  Substance Use Topics  . Smoking status: Current Every Day Smoker    Types: Cigarettes    Last Attempt to Quit: 04/20/2007  . Smokeless tobacco: Never Used     Comment: 3 cigarettes a day  . Alcohol Use: Yes     Comment: occassionally   Family History  Problem Relation Age of Onset  . Diabetes Neg Hx   . Heart disease Neg Hx   . Colon cancer Neg Hx    Allergies  Allergen Reactions  . Metformin And Related     diarrhea   Current Outpatient Prescriptions on File Prior to Visit  Medication Sig Dispense Refill  . Blood Glucose Monitoring Suppl (BLOOD GLUCOSE METER) kit To check glucose once daily and as needed for diabetes  1 each  0  . cyclobenzaprine (FLEXERIL) 10 MG tablet Take 1 tablet (10 mg total) by mouth 3 (three) times daily as needed for muscle spasms.  30  tablet  0  . glipiZIDE (GLIPIZIDE XL) 10 MG 24 hr tablet Take 1 tablet (10 mg total) by mouth daily.  30 tablet  5  . Lancets MISC To check glucose once daily and as needed for diabetes  10 each  3  . lisinopril-hydrochlorothiazide (PRINZIDE,ZESTORETIC) 10-12.5 MG per tablet Take 1 tablet by mouth daily.  30 tablet  5  . naproxen sodium (ANAPROX DS) 550 MG tablet Take 1 tablet (550 mg total) by mouth 2 (two) times daily with a meal.  40 tablet  0  . simvastatin (ZOCOR) 20 MG tablet Take 1 tablet (20 mg total) by mouth at bedtime.  30 tablet  5  . traMADol (ULTRAM) 50 MG tablet Take 1 tablet (50 mg total) by mouth every 8 (eight) hours as needed.  30 tablet  0   No current facility-administered medications on file prior to visit.    Review of Systems    Review of Systems  Constitutional: Negative for fever, appetite change, fatigue and unexpected weight change.  Eyes: Negative for pain and visual disturbance.  Respiratory: Negative for cough and shortness of breath.   Cardiovascular: Negative for cp or palpitations    Gastrointestinal: Negative for nausea, diarrhea and constipation.  Genitourinary: Negative for urgency and frequency.  Skin: Negative for pallor or rash   Neurological: Negative for weakness, light-headedness, numbness and headaches.  Hematological: Negative for adenopathy. Does not bruise/bleed easily.  Psychiatric/Behavioral: Negative for dysphoric mood. The patient is not nervous/anxious.      Objective:   Physical Exam  Constitutional: Zachary Lewis appears well-developed and well-nourished. No distress.  HENT:  Head: Normocephalic and atraumatic.  Mouth/Throat: Oropharynx is clear and moist.  Eyes: Conjunctivae and EOM are normal. Pupils are equal, round, and reactive to light. No scleral icterus.  Neck: Normal range of motion. Neck supple. No JVD present. Carotid bruit is not present. No thyromegaly present.  Cardiovascular: Normal rate, regular rhythm, normal heart sounds  and intact distal pulses.  Exam reveals no gallop.   Pulmonary/Chest: Effort normal and breath sounds normal. No respiratory distress. Zachary Lewis has no wheezes. Zachary Lewis exhibits no tenderness.  Abdominal: Soft. Bowel sounds are normal. Zachary Lewis exhibits no distension, no abdominal bruit and no mass. There is no tenderness.  Musculoskeletal: Zachary Lewis exhibits no edema.  Lymphadenopathy:    Zachary Lewis has no cervical adenopathy.  Neurological: Zachary Lewis is alert. Zachary Lewis has  normal reflexes. No cranial nerve deficit. Zachary Lewis exhibits normal muscle tone. Coordination normal.  Skin: Skin is warm and dry. No rash noted. No erythema. No pallor.  Psychiatric: Zachary Lewis has a normal mood and affect.          Assessment & Plan:   Problem List Items Addressed This Visit     Cardiovascular and Mediastinum   HYPERTENSION, BENIGN ESSENTIAL - Primary      bp in fair control at this time  BP Readings from Last 1 Encounters:  02/04/14 122/80   No changes needed Disc lifstyle change with low sodium diet and exercise   Labs reviewed  F/u 3 mo      Endocrine   DIABETES MELLITUS, TYPE II      Lab Results  Component Value Date   HGBA1C 9.3* 01/31/2014   Needs to start checking glucose Wants to see if Zachary Lewis can tolerate metformin - will try 500 bid  Also rev DM diet  Lab and f/u 3 mo  opthy appt this mo    Relevant Medications      metFORMIN (GLUCOPHAGE) tablet   Other Relevant Orders      Hemoglobin A1c     Other   HYPERLIPIDEMIA     Disc goals for lipids and reasons to control them Rev labs with pt Rev low sat fat diet in detail LDL is over 100- but Zachary Lewis was off med for a mo  Rev diet  Re check 3 mo  Inc statin if needed     Relevant Orders      Lipid panel   Smoking     Disc in detail risks of smoking and possible outcomes including copd, vascular/ heart disease, cancer , respiratory and sinus infections  Pt voices understanding Zachary Lewis is almost quit and plans to soon     Other Visit Diagnoses   Need for prophylactic vaccination  and inoculation against influenza        Relevant Orders       Flu Vaccine QUAD 36+ mos PF IM (Fluarix Quad PF) (Completed)

## 2014-02-04 NOTE — Assessment & Plan Note (Signed)
Disc goals for lipids and reasons to control them Rev labs with pt Rev low sat fat diet in detail LDL is over 100- but he was off med for a mo  Rev diet  Re check 3 mo  Inc statin if needed

## 2014-02-04 NOTE — Assessment & Plan Note (Signed)
bp in fair control at this time  BP Readings from Last 1 Encounters:  02/04/14 122/80   No changes needed Disc lifstyle change with low sodium diet and exercise   Labs reviewed  F/u 3 mo

## 2014-05-23 ENCOUNTER — Other Ambulatory Visit: Payer: Managed Care, Other (non HMO)

## 2014-05-27 ENCOUNTER — Ambulatory Visit: Payer: Managed Care, Other (non HMO) | Admitting: Family Medicine

## 2014-10-10 ENCOUNTER — Other Ambulatory Visit: Payer: Self-pay | Admitting: Family Medicine

## 2014-10-11 NOTE — Telephone Encounter (Signed)
Electronic refill request last f/u was 02/05/15 and pt cancelled his f/u on 05/27/14, and no future appt., please advise

## 2014-10-11 NOTE — Telephone Encounter (Signed)
Left voicemail requesting pt to call office back 

## 2014-10-11 NOTE — Telephone Encounter (Signed)
Please schedule f/u and refill until then  

## 2014-10-12 NOTE — Telephone Encounter (Signed)
appt scheduled and med refilled 

## 2014-11-04 ENCOUNTER — Ambulatory Visit: Payer: Managed Care, Other (non HMO) | Admitting: Family Medicine

## 2014-11-18 ENCOUNTER — Ambulatory Visit (INDEPENDENT_AMBULATORY_CARE_PROVIDER_SITE_OTHER): Payer: Managed Care, Other (non HMO) | Admitting: Family Medicine

## 2014-11-18 ENCOUNTER — Encounter: Payer: Self-pay | Admitting: Family Medicine

## 2014-11-18 VITALS — BP 126/84 | HR 88 | Temp 98.2°F | Ht 66.0 in | Wt 216.5 lb

## 2014-11-18 DIAGNOSIS — E1165 Type 2 diabetes mellitus with hyperglycemia: Secondary | ICD-10-CM

## 2014-11-18 DIAGNOSIS — E669 Obesity, unspecified: Secondary | ICD-10-CM

## 2014-11-18 DIAGNOSIS — Z720 Tobacco use: Secondary | ICD-10-CM | POA: Diagnosis not present

## 2014-11-18 DIAGNOSIS — I10 Essential (primary) hypertension: Secondary | ICD-10-CM | POA: Diagnosis not present

## 2014-11-18 DIAGNOSIS — F17200 Nicotine dependence, unspecified, uncomplicated: Secondary | ICD-10-CM

## 2014-11-18 DIAGNOSIS — E785 Hyperlipidemia, unspecified: Secondary | ICD-10-CM

## 2014-11-18 DIAGNOSIS — IMO0002 Reserved for concepts with insufficient information to code with codable children: Secondary | ICD-10-CM

## 2014-11-18 LAB — COMPREHENSIVE METABOLIC PANEL
ALBUMIN: 4.3 g/dL (ref 3.5–5.2)
ALT: 66 U/L — ABNORMAL HIGH (ref 0–53)
AST: 43 U/L — AB (ref 0–37)
Alkaline Phosphatase: 134 U/L — ABNORMAL HIGH (ref 39–117)
BUN: 27 mg/dL — ABNORMAL HIGH (ref 6–23)
CALCIUM: 9.8 mg/dL (ref 8.4–10.5)
CO2: 25 meq/L (ref 19–32)
Chloride: 96 mEq/L (ref 96–112)
Creatinine, Ser: 1.27 mg/dL (ref 0.40–1.50)
GFR: 75.66 mL/min (ref >60.00)
GLUCOSE: 219 mg/dL — AB (ref 70–99)
POTASSIUM: 4.1 meq/L (ref 3.5–5.1)
SODIUM: 133 meq/L — AB (ref 135–145)
TOTAL PROTEIN: 7.9 g/dL (ref 6.0–8.3)
Total Bilirubin: 0.4 mg/dL (ref 0.2–1.2)

## 2014-11-18 LAB — LIPID PANEL
CHOLESTEROL: 259 mg/dL — AB (ref 0–200)
HDL: 43.6 mg/dL (ref >39.00)
LDL Cholesterol: 176 mg/dL — ABNORMAL HIGH (ref 0–99)
NonHDL: 215.4
TRIGLYCERIDES: 199 mg/dL — AB (ref 0.0–149.0)
Total CHOL/HDL Ratio: 6
VLDL: 39.8 mg/dL (ref 0.0–40.0)

## 2014-11-18 LAB — HEMOGLOBIN A1C: HEMOGLOBIN A1C: 10.6 % — AB (ref 4.6–6.5)

## 2014-11-18 MED ORDER — GLIPIZIDE ER 10 MG PO TB24: 10.0000 mg | ORAL_TABLET | Freq: Every day | ORAL | Status: DC

## 2014-11-18 MED ORDER — SIMVASTATIN 20 MG PO TABS: 20.0000 mg | ORAL_TABLET | Freq: Every day | ORAL | Status: DC

## 2014-11-18 MED ORDER — METFORMIN HCL 500 MG PO TABS: 500.0000 mg | ORAL_TABLET | Freq: Two times a day (BID) | ORAL | Status: DC

## 2014-11-18 MED ORDER — LISINOPRIL-HYDROCHLOROTHIAZIDE 10-12.5 MG PO TABS
1.0000 | ORAL_TABLET | Freq: Every day | ORAL | Status: DC
Start: 2014-11-18 — End: 2016-02-29

## 2014-11-18 NOTE — Progress Notes (Signed)
Pre visit review using our clinic review tool, if applicable. No additional management support is needed unless otherwise documented below in the visit note. 

## 2014-11-18 NOTE — Patient Instructions (Signed)
Work on diet and exercise and weight loss  Lab today  Please get your annual eye doctor appt  Also set a quit date for smoking   Follow up in 3 months with labs prior

## 2014-11-18 NOTE — Progress Notes (Signed)
Subjective:    Patient ID: Zachary Lewis, male    DOB: 05-25-1960, 54 y.o.   MRN: [id number]  HPI Here for f/u of chronic medical problems   Obese but lost 5 lb  (had dropped 13 lb and gained back) bmi is 34  Smoking status- 3-4 cig per day now  Quit date is this mo    bp is stable today  No cp or palpitations or headaches or edema  No side effects to medicines  BP Readings from Last 3 Encounters:  11/18/14 126/84  02/04/14 122/80  01/04/14 142/96     Diabetes Home sugar results -has not been checking it  DM diet - overall better, eating lower calorie foods / grilling things  , no sweets , perhaps overdoes it with fruit  Exercise - has dropped it due to graduations and other events , does walk the dog  Symptoms-none  A1C last  Lab Results  Component Value Date   HGBA1C 9.3* 01/31/2014  due for this   No problems with medications  - metformin 500 bid  (diarrhea with 1000)  and he did not know he was supposed to take the glipizide 10  Renal protection ace  Last eye exam about a yr ago   Patient Active Problem List   Diagnosis Date Noted  . Smoking 01/04/2014  . Personal history of colonic adenomas 03/09/2013  . Prostate cancer screening 12/07/2012  . Routine general medical examination at a health care facility 08/23/2010  . HYPERTENSION, BENIGN ESSENTIAL 01/05/2010  . BACK PAIN, LUMBAR, WITH RADICULOPATHY 09/13/2008  . OBESITY 09/08/2007  . TINEA PEDIS 07/16/2007  . Diabetes type 2, uncontrolled 08/13/2006  . Hyperlipidemia 08/13/2006  . ALLERGY 08/13/2006  . FATTY LIVER DISEASE, HX OF 08/13/2006  . TOBACCO USE, QUIT 08/13/2006   Past Medical History  Diagnosis Date  . Diabetes mellitus     Type II  . Hyperlipidemia   . Hypertension   . Obesity   . Tinea pedis     Chronic  . Personal history of colonic adenomas 03/09/2013   Past Surgical History  Procedure Laterality Date  . Vasectomy  07/2002  . Umbilical hernia repair  01/1997  . Myringectomy   06/2005   History  Substance Use Topics  . Smoking status: Current Every Day Smoker -- 0.25 packs/day    Types: Cigarettes  . Smokeless tobacco: Never Used  . Alcohol Use: 0.0 oz/week    0 Standard drinks or equivalent per week     Comment: occassionally   Family History  Problem Relation Age of Onset  . Diabetes Neg Hx   . Heart disease Neg Hx   . Colon cancer Neg Hx    Allergies  Allergen Reactions  . Metformin And Related     diarrhea   Current Outpatient Prescriptions on File Prior to Visit  Medication Sig Dispense Refill  . Blood Glucose Monitoring Suppl (BLOOD GLUCOSE METER) kit To check glucose once daily and as needed for diabetes 1 each 0  . cyclobenzaprine (FLEXERIL) 10 MG tablet Take 1 tablet (10 mg total) by mouth 3 (three) times daily as needed for muscle spasms. 30 tablet 0  . glipiZIDE (GLIPIZIDE XL) 10 MG 24 hr tablet Take 1 tablet (10 mg total) by mouth daily. 30 tablet 5  . Lancets MISC To check glucose once daily and as needed for diabetes 10 each 3  . lisinopril-hydrochlorothiazide (PRINZIDE,ZESTORETIC) 10-12.5 MG per tablet TAKE 1 TABLET BY MOUTH DAILY 30 tablet  0  . metFORMIN (GLUCOPHAGE) 500 MG tablet Take 1 tablet (500 mg total) by mouth 2 (two) times daily with a meal. 60 tablet 3  . simvastatin (ZOCOR) 20 MG tablet Take 1 tablet (20 mg total) by mouth at bedtime. 30 tablet 5  . traMADol (ULTRAM) 50 MG tablet Take 1 tablet (50 mg total) by mouth every 8 (eight) hours as needed. 30 tablet 0   No current facility-administered medications on file prior to visit.      Cholesterol  Lab Results  Component Value Date   CHOL 199 01/31/2014   HDL 49.70 01/31/2014   LDLCALC 130* 01/31/2014   LDLDIRECT 176.9 12/08/2012   TRIG 95.0 01/31/2014   CHOLHDL 4 01/31/2014   eating less fat    Patient Active Problem List   Diagnosis Date Noted  . Smoking 01/04/2014  . Personal history of colonic adenomas 03/09/2013  . Prostate cancer screening 12/07/2012  .  Routine general medical examination at a health care facility 08/23/2010  . HYPERTENSION, BENIGN ESSENTIAL 01/05/2010  . BACK PAIN, LUMBAR, WITH RADICULOPATHY 09/13/2008  . Obesity 09/08/2007  . TINEA PEDIS 07/16/2007  . Diabetes type 2, uncontrolled 08/13/2006  . Hyperlipidemia 08/13/2006  . ALLERGY 08/13/2006  . FATTY LIVER DISEASE, HX OF 08/13/2006  . TOBACCO USE, QUIT 08/13/2006   Past Medical History  Diagnosis Date  . Diabetes mellitus     Type II  . Hyperlipidemia   . Hypertension   . Obesity   . Tinea pedis     Chronic  . Personal history of colonic adenomas 03/09/2013   Past Surgical History  Procedure Laterality Date  . Vasectomy  07/2002  . Umbilical hernia repair  01/1997  . Myringectomy  06/2005   History  Substance Use Topics  . Smoking status: Current Every Day Smoker -- 0.25 packs/day    Types: Cigarettes  . Smokeless tobacco: Never Used  . Alcohol Use: 0.0 oz/week    0 Standard drinks or equivalent per week     Comment: occassionally   Family History  Problem Relation Age of Onset  . Diabetes Neg Hx   . Heart disease Neg Hx   . Colon cancer Neg Hx    Allergies  Allergen Reactions  . Metformin And Related     diarrhea   Current Outpatient Prescriptions on File Prior to Visit  Medication Sig Dispense Refill  . Blood Glucose Monitoring Suppl (BLOOD GLUCOSE METER) kit To check glucose once daily and as needed for diabetes 1 each 0  . cyclobenzaprine (FLEXERIL) 10 MG tablet Take 1 tablet (10 mg total) by mouth 3 (three) times daily as needed for muscle spasms. 30 tablet 0  . Lancets MISC To check glucose once daily and as needed for diabetes 10 each 3  . traMADol (ULTRAM) 50 MG tablet Take 1 tablet (50 mg total) by mouth every 8 (eight) hours as needed. 30 tablet 0   No current facility-administered medications on file prior to visit.     Review of Systems Review of Systems  Constitutional: Negative for fever, appetite change, fatigue and  unexpected weight change.  Eyes: Negative for pain and visual disturbance.  Respiratory: Negative for cough and shortness of breath.   Cardiovascular: Negative for cp or palpitations    Gastrointestinal: Negative for nausea, diarrhea and constipation.  Genitourinary: Negative for urgency and frequency.  Skin: Negative for pallor or rash   Neurological: Negative for weakness, light-headedness, numbness and headaches.  Hematological: Negative for adenopathy. Does not  bruise/bleed easily.  Psychiatric/Behavioral: Negative for dysphoric mood. The patient is not nervous/anxious.         Objective:   Physical Exam  Constitutional: He appears well-developed and well-nourished. No distress.  obese and well appearing   HENT:  Head: Normocephalic and atraumatic.  Mouth/Throat: Oropharynx is clear and moist.  Eyes: Conjunctivae and EOM are normal. Pupils are equal, round, and reactive to light.  Neck: Normal range of motion. Neck supple. No JVD present. Carotid bruit is not present. No thyromegaly present.  Cardiovascular: Normal rate, regular rhythm, normal heart sounds and intact distal pulses.  Exam reveals no gallop.   Pulmonary/Chest: Effort normal and breath sounds normal. No respiratory distress. He has no wheezes. He has no rales.  No crackles  Abdominal: Soft. Bowel sounds are normal. He exhibits no distension, no abdominal bruit and no mass. There is no tenderness.  Musculoskeletal: He exhibits no edema.  Lymphadenopathy:    He has no cervical adenopathy.  Neurological: He is alert. He has normal reflexes.  Skin: Skin is warm and dry. No rash noted.  Psychiatric: He has a normal mood and affect.          Assessment & Plan:   Problem List Items Addressed This Visit    Diabetes type 2, uncontrolled    Noncompliant with med - not taking glipizide  On metformin 500 (intol of 1000)  Will get back on track Also low glycemic diet/exercise and wt loss  A1C today  Adv from  there F/u 3 mo      Relevant Medications   simvastatin (ZOCOR) 20 MG tablet   metFORMIN (GLUCOPHAGE) 500 MG tablet   glipiZIDE (GLIPIZIDE XL) 10 MG 24 hr tablet   lisinopril-hydrochlorothiazide (PRINZIDE,ZESTORETIC) 10-12.5 MG per tablet   Other Relevant Orders   Hemoglobin A1c (Completed)   Hyperlipidemia    Disc goals for lipids and reasons to control them Rev labs with pt from last draw  Rev low sat fat diet in detail Enc better compliance with statin med       Relevant Medications   simvastatin (ZOCOR) 20 MG tablet   lisinopril-hydrochlorothiazide (PRINZIDE,ZESTORETIC) 10-12.5 MG per tablet   Other Relevant Orders   Lipid panel (Completed)   HYPERTENSION, BENIGN ESSENTIAL - Primary    bp in fair control at this time  BP Readings from Last 1 Encounters:  11/18/14 126/84   No changes needed Disc lifstyle change with low sodium diet and exercise  Labs reviewed       Relevant Medications   simvastatin (ZOCOR) 20 MG tablet   lisinopril-hydrochlorothiazide (PRINZIDE,ZESTORETIC) 10-12.5 MG per tablet   Other Relevant Orders   Comprehensive metabolic panel (Completed)   Obesity    Discussed how this problem influences overall health and the risks it imposes  Reviewed plan for weight loss with lower calorie diet (via better food choices and also portion control or program like weight watchers) and exercise building up to or more than 30 minutes 5 days per week including some aerobic activity   Enc to get back to wt loss efforts       Relevant Medications   metFORMIN (GLUCOPHAGE) 500 MG tablet   glipiZIDE (GLIPIZIDE XL) 10 MG 24 hr tablet   Smoking    Disc in detail risks of smoking and possible outcomes including copd, vascular/ heart disease, cancer , respiratory and sinus infections  Pt voices understanding Pt unsure he is ready to set a quit date

## 2014-11-19 LAB — HM DIABETES EYE EXAM

## 2014-11-21 NOTE — Assessment & Plan Note (Signed)
bp in fair control at this time  BP Readings from Last 1 Encounters:  11/18/14 126/84   No changes needed Disc lifstyle change with low sodium diet and exercise  Labs reviewed

## 2014-11-21 NOTE — Assessment & Plan Note (Signed)
Disc goals for lipids and reasons to control them Rev labs with pt from last draw  Rev low sat fat diet in detail Enc better compliance with statin med

## 2014-11-21 NOTE — Assessment & Plan Note (Signed)
Noncompliant with med - not taking glipizide  On metformin 500 (intol of 1000)  Will get back on track Also low glycemic diet/exercise and wt loss  A1C today  Adv from there F/u 3 mo

## 2014-11-21 NOTE — Assessment & Plan Note (Signed)
Disc in detail risks of smoking and possible outcomes including copd, vascular/ heart disease, cancer , respiratory and sinus infections  Pt voices understanding Pt unsure he is ready to set a quit date

## 2014-11-21 NOTE — Assessment & Plan Note (Signed)
Discussed how this problem influences overall health and the risks it imposes  Reviewed plan for weight loss with lower calorie diet (via better food choices and also portion control or program like weight watchers) and exercise building up to or more than 30 minutes 5 days per week including some aerobic activity   Enc to get back to wt loss efforts

## 2015-02-12 ENCOUNTER — Telehealth: Payer: Self-pay | Admitting: Family Medicine

## 2015-02-12 DIAGNOSIS — I10 Essential (primary) hypertension: Secondary | ICD-10-CM

## 2015-02-12 DIAGNOSIS — IMO0002 Reserved for concepts with insufficient information to code with codable children: Secondary | ICD-10-CM

## 2015-02-12 DIAGNOSIS — E785 Hyperlipidemia, unspecified: Secondary | ICD-10-CM

## 2015-02-12 DIAGNOSIS — E1165 Type 2 diabetes mellitus with hyperglycemia: Secondary | ICD-10-CM

## 2015-02-12 NOTE — Telephone Encounter (Signed)
-----   Message from Ellamae Sia sent at 02/06/2015 10:46 AM EDT ----- Regarding: Lab orders for Monday, 9.26.16 Lab orders for a 3 month follow up appt.

## 2015-02-13 ENCOUNTER — Other Ambulatory Visit: Payer: Managed Care, Other (non HMO)

## 2015-02-20 ENCOUNTER — Telehealth: Payer: Self-pay | Admitting: Family Medicine

## 2015-02-20 ENCOUNTER — Ambulatory Visit: Payer: Managed Care, Other (non HMO) | Admitting: Family Medicine

## 2015-02-20 DIAGNOSIS — Z0289 Encounter for other administrative examinations: Secondary | ICD-10-CM

## 2015-02-20 NOTE — Telephone Encounter (Signed)
Please re schedule him if he wants to -it was a f/u of DM/chronic medical conditions

## 2015-02-20 NOTE — Telephone Encounter (Signed)
Pt did not come in for their appt today for 3 month follow up. Please let me know if pt needs to be contacted immediately for follow up or no follow up needed. Best phone number to contact pt is [phone].

## 2015-04-24 ENCOUNTER — Ambulatory Visit: Payer: Managed Care, Other (non HMO) | Admitting: Family Medicine

## 2015-05-31 ENCOUNTER — Other Ambulatory Visit: Payer: Managed Care, Other (non HMO)

## 2015-06-05 ENCOUNTER — Encounter: Payer: Self-pay | Admitting: Family Medicine

## 2015-06-05 ENCOUNTER — Ambulatory Visit (INDEPENDENT_AMBULATORY_CARE_PROVIDER_SITE_OTHER): Payer: Managed Care, Other (non HMO) | Admitting: Family Medicine

## 2015-06-05 VITALS — BP 143/90 | HR 87 | Temp 97.7°F | Ht 66.0 in | Wt 217.5 lb

## 2015-06-05 DIAGNOSIS — E785 Hyperlipidemia, unspecified: Secondary | ICD-10-CM

## 2015-06-05 DIAGNOSIS — E669 Obesity, unspecified: Secondary | ICD-10-CM

## 2015-06-05 DIAGNOSIS — Z720 Tobacco use: Secondary | ICD-10-CM

## 2015-06-05 DIAGNOSIS — I10 Essential (primary) hypertension: Secondary | ICD-10-CM

## 2015-06-05 DIAGNOSIS — E1165 Type 2 diabetes mellitus with hyperglycemia: Secondary | ICD-10-CM

## 2015-06-05 DIAGNOSIS — Z87891 Personal history of nicotine dependence: Secondary | ICD-10-CM

## 2015-06-05 DIAGNOSIS — Z23 Encounter for immunization: Secondary | ICD-10-CM | POA: Diagnosis not present

## 2015-06-05 DIAGNOSIS — F17200 Nicotine dependence, unspecified, uncomplicated: Secondary | ICD-10-CM

## 2015-06-05 DIAGNOSIS — IMO0001 Reserved for inherently not codable concepts without codable children: Secondary | ICD-10-CM

## 2015-06-05 NOTE — Assessment & Plan Note (Signed)
Disc goals for lipids and reasons to control them Rev labs with pt-from last check  Rev low sat fat diet in detail  Lab planned next wk fasting

## 2015-06-05 NOTE — Progress Notes (Signed)
Pre visit review using our clinic review tool, if applicable. No additional management support is needed unless otherwise documented below in the visit note. 

## 2015-06-05 NOTE — Assessment & Plan Note (Signed)
bp is not at goal today Per pt better at home   BP Readings from Last 1 Encounters:  06/05/15 143/90  asked pt to f/u in 1 mo with his bp cuff  No changes yet  Disc lifstyle change with low sodium diet and exercise

## 2015-06-05 NOTE — Progress Notes (Signed)
Subjective:    Patient ID: Zachary Lewis, male    DOB: 1961/01/22, 55 y.o.   MRN: [id number]  HPI Here for f/u of chronic medical problems   Obesity Wt is up1 lb with bmi of 35  Cholesterol Lab Results  Component Value Date   CHOL 259* 11/18/2014   HDL 43.60 11/18/2014   LDLCALC 176* 11/18/2014   LDLDIRECT 176.9 12/08/2012   TRIG 199.0* 11/18/2014   CHOLHDL 6 11/18/2014   Due for lab today  Has cut out sausage biscuits  On zocor   Diet  bp is stable today  No cp or palpitations or headaches or edema  No side effects to medicines  BP Readings from Last 3 Encounters:  06/05/15 146/94  11/18/14 126/84  02/04/14 122/80  bp at home is usually 120s/80s - also checks at [facility]    Diabetes Home sugar results -does not check (does not know how) DM diet -smaller portions overall , cut back biscuits  Exercise -now back to treadmill or bike 45 min per day  Symptoms- is thirsty a lot  A1C last  Lab Results  Component Value Date   HGBA1C 10.6* 11/18/2014  this was quite high  No problems with medications - metformin and glipizide  Renal protection- ace  Last eye exam - was in the summer     He quit smoking in Nov !! Just put them down-was hard in the beginning  Stayed busy  Is proud  No longer craving cigarettes   Wants to re schedule labs for next week   Patient Active Problem List   Diagnosis Date Noted  . Personal history of colonic adenomas 03/09/2013  . Prostate cancer screening 12/07/2012  . Routine general medical examination at a health care facility 08/23/2010  . HYPERTENSION, BENIGN ESSENTIAL 01/05/2010  . BACK PAIN, LUMBAR, WITH RADICULOPATHY 09/13/2008  . Obesity 09/08/2007  . TINEA PEDIS 07/16/2007  . Diabetes type 2, uncontrolled ([place]) 08/13/2006  . Hyperlipidemia 08/13/2006  . ALLERGY 08/13/2006  . FATTY LIVER DISEASE, HX OF 08/13/2006  . TOBACCO USE, QUIT 08/13/2006   Past Medical History  Diagnosis Date  . Diabetes mellitus    Type II  . Hyperlipidemia   . Hypertension   . Obesity   . Tinea pedis     Chronic  . Personal history of colonic adenomas 03/09/2013   Past Surgical History  Procedure Laterality Date  . Vasectomy  07/2002  . Umbilical hernia repair  01/1997  . Myringectomy  06/2005   Social History  Substance Use Topics  . Smoking status: Former Smoker    Types: Cigarettes    Quit date: 04/09/2015  . Smokeless tobacco: Never Used  . Alcohol Use: 0.0 oz/week    0 Standard drinks or equivalent per week     Comment: occassionally   Family History  Problem Relation Age of Onset  . Diabetes Neg Hx   . Heart disease Neg Hx   . Colon cancer Neg Hx    Allergies  Allergen Reactions  . Metformin And Related     diarrhea   Current Outpatient Prescriptions on File Prior to Visit  Medication Sig Dispense Refill  . Blood Glucose Monitoring Suppl (BLOOD GLUCOSE METER) kit To check glucose once daily and as needed for diabetes 1 each 0  . cyclobenzaprine (FLEXERIL) 10 MG tablet Take 1 tablet (10 mg total) by mouth 3 (three) times daily as needed for muscle spasms. 30 tablet 0  . glipiZIDE (GLIPIZIDE XL) 10  MG 24 hr tablet Take 1 tablet (10 mg total) by mouth daily. 90 tablet 3  . Lancets MISC To check glucose once daily and as needed for diabetes 10 each 3  . lisinopril-hydrochlorothiazide (PRINZIDE,ZESTORETIC) 10-12.5 MG per tablet Take 1 tablet by mouth daily. 90 tablet 3  . metFORMIN (GLUCOPHAGE) 500 MG tablet Take 1 tablet (500 mg total) by mouth 2 (two) times daily with a meal. 180 tablet 3  . simvastatin (ZOCOR) 20 MG tablet Take 1 tablet (20 mg total) by mouth at bedtime. 90 tablet 3  . traMADol (ULTRAM) 50 MG tablet Take 1 tablet (50 mg total) by mouth every 8 (eight) hours as needed. 30 tablet 0   No current facility-administered medications on file prior to visit.    Review of Systems Review of Systems  Constitutional: Negative for fever, appetite change, fatigue and unexpected weight  change.  Eyes: Negative for pain and visual disturbance.  Respiratory: Negative for cough and shortness of breath.   Cardiovascular: Negative for cp or palpitations    Gastrointestinal: Negative for nausea, diarrhea and constipation.  Genitourinary: Negative for urgency and frequency. pos for thirst  Skin: Negative for pallor or rash   Neurological: Negative for weakness, light-headedness, numbness and headaches.  Hematological: Negative for adenopathy. Does not bruise/bleed easily.  Psychiatric/Behavioral: Negative for dysphoric mood. The patient is not nervous/anxious.         Objective:   Physical Exam  Constitutional: He appears well-developed and well-nourished. No distress.  obese and well appearing   HENT:  Head: Normocephalic and atraumatic.  Mouth/Throat: Oropharynx is clear and moist.  Eyes: Conjunctivae and EOM are normal. Pupils are equal, round, and reactive to light.  Neck: Normal range of motion. Neck supple. No JVD present. Carotid bruit is not present. No thyromegaly present.  Cardiovascular: Normal rate, regular rhythm, normal heart sounds and intact distal pulses.  Exam reveals no gallop.   Pulmonary/Chest: Effort normal and breath sounds normal. No respiratory distress. He has no wheezes. He has no rales.  No crackles  Abdominal: Soft. Bowel sounds are normal. He exhibits no distension, no abdominal bruit and no mass. There is no tenderness.  Musculoskeletal: He exhibits no edema.  Lymphadenopathy:    He has no cervical adenopathy.  Neurological: He is alert. He has normal reflexes.  Skin: Skin is warm and dry. No rash noted.  Psychiatric: He has a normal mood and affect.          Assessment & Plan:   Problem List Items Addressed This Visit      Cardiovascular and Mediastinum   HYPERTENSION, BENIGN ESSENTIAL - Primary    bp is not at goal today Per pt better at home   BP Readings from Last 1 Encounters:  06/05/15 143/90  asked pt to f/u in 1 mo  with his bp cuff  No changes yet  Disc lifstyle change with low sodium diet and exercise          Endocrine   Diabetes type 2, uncontrolled ([place])    Due for A1C Lab Results  Component Value Date   HGBA1C 10.6* 11/18/2014   Pt failed to f/u for this  Rev low glycemic diet and exercise  Pt will take his Dm testing equpt to [facility] for inst for use and begin checks  Also enc him to consider more Dm teaching if affordable  Lab in 1 wk  F/u 1 mo  Enc further exercise  Other   Hyperlipidemia    Disc goals for lipids and reasons to control them Rev labs with pt-from last check  Rev low sat fat diet in detail  Lab planned next wk fasting       Obesity    Discussed how this problem influences overall health and the risks it imposes  Reviewed plan for weight loss with lower calorie diet (via better food choices and also portion control or program like weight watchers) and exercise building up to or more than 30 minutes 5 days per week including some aerobic activity   Enc to keep working on diet and exercise       RESOLVED: Smoking   TOBACCO USE, QUIT    Quit again since November  Commended ! Urged to keep it up        Other Visit Diagnoses    Need for influenza vaccination        Relevant Orders    Flu Vaccine QUAD 36+ mos PF IM (Fluarix & Fluzone Quad PF) (Completed)

## 2015-06-05 NOTE — Patient Instructions (Signed)
Take your blood sugar machine to [facility] and ask someone to teach you how to use it  Have them tell you about their diabetic teaching program Schedule fasting lab for next week  Follow up with me in 1 month- bring your bp cuff to make sure it is accurate Stick to a diabetic diet Keep exercising!  Great job quitting smoking!!!!  Flu shot today

## 2015-06-05 NOTE — Assessment & Plan Note (Signed)
Discussed how this problem influences overall health and the risks it imposes  Reviewed plan for weight loss with lower calorie diet (via better food choices and also portion control or program like weight watchers) and exercise building up to or more than 30 minutes 5 days per week including some aerobic activity   Enc to keep working on diet and exercise

## 2015-06-05 NOTE — Assessment & Plan Note (Signed)
Quit again since November  Commended ! Urged to keep it up

## 2015-06-05 NOTE — Assessment & Plan Note (Signed)
Due for A1C Lab Results  Component Value Date   HGBA1C 10.6* 11/18/2014   Pt failed to f/u for this  Rev low glycemic diet and exercise  Pt will take his Dm testing equpt to [facility] for inst for use and begin checks  Also enc him to consider more Dm teaching if affordable  Lab in 1 wk  F/u 1 mo  Enc further exercise

## 2015-06-27 ENCOUNTER — Other Ambulatory Visit: Payer: Managed Care, Other (non HMO)

## 2015-07-05 ENCOUNTER — Ambulatory Visit: Payer: Managed Care, Other (non HMO) | Admitting: Family Medicine

## 2015-07-28 ENCOUNTER — Other Ambulatory Visit: Payer: Managed Care, Other (non HMO)

## 2015-08-02 ENCOUNTER — Telehealth: Payer: Self-pay | Admitting: Family Medicine

## 2015-08-02 ENCOUNTER — Ambulatory Visit: Payer: Managed Care, Other (non HMO) | Admitting: Family Medicine

## 2015-08-02 NOTE — Telephone Encounter (Signed)
Patient did not come for their scheduled appointment today for 1 month follow up Please let me know if the patient needs to be contacted immediately for follow up or if no follow up is necessary.

## 2015-11-19 LAB — HM DIABETES EYE EXAM

## 2016-02-28 ENCOUNTER — Encounter: Payer: Self-pay | Admitting: Internal Medicine

## 2016-02-29 ENCOUNTER — Other Ambulatory Visit: Payer: Self-pay

## 2016-02-29 MED ORDER — METFORMIN HCL 500 MG PO TABS
500.0000 mg | ORAL_TABLET | Freq: Two times a day (BID) | ORAL | 0 refills | Status: DC
Start: 2016-02-29 — End: 2016-04-17

## 2016-02-29 MED ORDER — GLIPIZIDE ER 10 MG PO TB24: 10.0000 mg | ORAL_TABLET | Freq: Every day | ORAL | 0 refills | Status: DC

## 2016-02-29 MED ORDER — SIMVASTATIN 20 MG PO TABS: 20.0000 mg | ORAL_TABLET | Freq: Every day | ORAL | 0 refills | Status: DC

## 2016-02-29 MED ORDER — LISINOPRIL-HYDROCHLOROTHIAZIDE 10-12.5 MG PO TABS: 1.0000 | ORAL_TABLET | Freq: Every day | ORAL | 0 refills | Status: DC

## 2016-02-29 NOTE — Telephone Encounter (Signed)
Pt request refill on glipizide,lisinopril-HCTZ,metformin and simvastatin to [facility] ; pt last seen 05/2015 and has appt scheduled 04/15/16. Refilled per protocol and pt voiced understanding.

## 2016-04-15 ENCOUNTER — Ambulatory Visit: Payer: Managed Care, Other (non HMO) | Admitting: Family Medicine

## 2016-04-15 DIAGNOSIS — Z0289 Encounter for other administrative examinations: Secondary | ICD-10-CM

## 2016-04-17 ENCOUNTER — Ambulatory Visit (INDEPENDENT_AMBULATORY_CARE_PROVIDER_SITE_OTHER): Payer: Managed Care, Other (non HMO) | Admitting: Family Medicine

## 2016-04-17 ENCOUNTER — Encounter: Payer: Self-pay | Admitting: Family Medicine

## 2016-04-17 VITALS — BP 150/94 | HR 96 | Temp 98.5°F | Ht 66.0 in | Wt 205.2 lb

## 2016-04-17 DIAGNOSIS — E7800 Pure hypercholesterolemia, unspecified: Secondary | ICD-10-CM

## 2016-04-17 DIAGNOSIS — K760 Fatty (change of) liver, not elsewhere classified: Secondary | ICD-10-CM | POA: Diagnosis not present

## 2016-04-17 DIAGNOSIS — Z23 Encounter for immunization: Secondary | ICD-10-CM | POA: Diagnosis not present

## 2016-04-17 DIAGNOSIS — I10 Essential (primary) hypertension: Secondary | ICD-10-CM

## 2016-04-17 DIAGNOSIS — E6609 Other obesity due to excess calories: Secondary | ICD-10-CM

## 2016-04-17 DIAGNOSIS — IMO0001 Reserved for inherently not codable concepts without codable children: Secondary | ICD-10-CM

## 2016-04-17 DIAGNOSIS — Z6833 Body mass index (BMI) 33.0-33.9, adult: Secondary | ICD-10-CM

## 2016-04-17 DIAGNOSIS — E1165 Type 2 diabetes mellitus with hyperglycemia: Secondary | ICD-10-CM

## 2016-04-17 LAB — TSH: TSH: 1.8 u[IU]/mL (ref 0.35–4.50)

## 2016-04-17 LAB — COMPREHENSIVE METABOLIC PANEL
ALT: 51 U/L (ref 0–53)
AST: 41 U/L — ABNORMAL HIGH (ref 0–37)
Albumin: 4.3 g/dL (ref 3.5–5.2)
Alkaline Phosphatase: 168 U/L — ABNORMAL HIGH (ref 39–117)
BILIRUBIN TOTAL: 0.5 mg/dL (ref 0.2–1.2)
BUN: 13 mg/dL (ref 6–23)
CHLORIDE: 96 meq/L (ref 96–112)
CO2: 28 meq/L (ref 19–32)
Calcium: 10 mg/dL (ref 8.4–10.5)
Creatinine, Ser: 0.73 mg/dL (ref 0.40–1.50)
GFR: 142.6 mL/min (ref >60.00)
GLUCOSE: 281 mg/dL — AB (ref 70–99)
Potassium: 3.8 mEq/L (ref 3.5–5.1)
Sodium: 136 mEq/L (ref 135–145)
Total Protein: 7.8 g/dL (ref 6.0–8.3)

## 2016-04-17 LAB — CBC WITH DIFFERENTIAL/PLATELET
BASOS ABS: 0.1 10*3/uL (ref 0.0–0.1)
BASOS PCT: 1 % (ref 0.0–3.0)
EOS ABS: 0.3 10*3/uL (ref 0.0–0.7)
Eosinophils Relative: 3.3 % (ref 0.0–5.0)
HCT: 46.8 % (ref 39.0–52.0)
Hemoglobin: 16.2 g/dL (ref 13.0–17.0)
LYMPHS ABS: 1.8 10*3/uL (ref 0.7–4.0)
Lymphocytes Relative: 20.9 % (ref 12.0–46.0)
MCHC: 34.6 g/dL (ref 30.0–36.0)
MCV: 99 fl (ref 78.0–100.0)
MONOS PCT: 7.1 % (ref 3.0–12.0)
Monocytes Absolute: 0.6 10*3/uL (ref 0.1–1.0)
NEUTROS ABS: 5.8 10*3/uL (ref 1.4–7.7)
NEUTROS PCT: 67.7 % (ref 43.0–77.0)
PLATELETS: 252 10*3/uL (ref 150.0–400.0)
RBC: 4.73 Mil/uL (ref 4.22–5.81)
RDW: 14.1 % (ref 11.5–15.5)
WBC: 8.6 10*3/uL (ref 4.0–10.5)

## 2016-04-17 LAB — LIPID PANEL
CHOL/HDL RATIO: 5
CHOLESTEROL: 268 mg/dL — AB (ref 0–200)
HDL: 56.6 mg/dL (ref >39.00)
LDL CALC: 183 mg/dL — AB (ref 0–99)
NonHDL: 211.19
TRIGLYCERIDES: 139 mg/dL (ref 0.0–149.0)
VLDL: 27.8 mg/dL (ref 0.0–40.0)

## 2016-04-17 LAB — HEMOGLOBIN A1C: Hgb A1c MFr Bld: 12.4 % — ABNORMAL HIGH (ref 4.6–6.5)

## 2016-04-17 MED ORDER — LISINOPRIL-HYDROCHLOROTHIAZIDE 20-25 MG PO TABS: 1.0000 | ORAL_TABLET | Freq: Every day | ORAL | 11 refills | Status: DC

## 2016-04-17 MED ORDER — GLIPIZIDE ER 10 MG PO TB24: 10.0000 mg | ORAL_TABLET | Freq: Every day | ORAL | 11 refills | Status: DC

## 2016-04-17 MED ORDER — SIMVASTATIN 20 MG PO TABS: 20.0000 mg | ORAL_TABLET | Freq: Every day | ORAL | 11 refills | Status: DC

## 2016-04-17 MED ORDER — METFORMIN HCL 500 MG PO TABS: 500.0000 mg | ORAL_TABLET | Freq: Two times a day (BID) | ORAL | 11 refills | Status: DC

## 2016-04-17 NOTE — Assessment & Plan Note (Signed)
bp is not controlled Inc lisinopril hct to 20-25 mg daily  F/u in about a month for visit and labs DASH diet rev/ exercise enc  Enc to keep loosing wt

## 2016-04-17 NOTE — Assessment & Plan Note (Signed)
Commended on wt loss so far and exercise  However worried that out of control glucose could add to it   Discussed how this problem influences overall health and the risks it imposes  Reviewed plan for weight loss with lower calorie diet (via better food choices and also portion control or program like weight watchers) and exercise building up to or more than 30 minutes 5 days per week including some aerobic activity

## 2016-04-17 NOTE — Assessment & Plan Note (Signed)
Pt has been lost to f/u  On metformin and glipizide  ? If he could tolerated larger dose of metformin if XL A1C today  ? If it will be up given wt loss  Lab Results  Component Value Date   HGBA1C 10.6 (H) 11/18/2014   Had his eye exam  Ref to DM teaching F/u 1 mo  Labs today for A1C

## 2016-04-17 NOTE — Assessment & Plan Note (Signed)
Last lipids high off simvastatin-now he is back on it  Diet - fair  Lab today  Goal LDL under 100

## 2016-04-17 NOTE — Patient Instructions (Addendum)
Flu shot today  Stop at check out for diabetic teaching referral  Keep working on low fat and low sugar diet and weight loss Take medication consistently  Labs today  Follow up early Jan for blood pressure visit

## 2016-04-17 NOTE — Assessment & Plan Note (Signed)
Lab today  Hope for imp with wt loss

## 2016-04-17 NOTE — Progress Notes (Signed)
Subjective:    Patient ID: Zachary Lewis, male    DOB: 1960/10/05, 55 y.o.   MRN: [id number]  HPI Here for f/u of chronic health problems   Works for [employer]- working very long hours Lost to f/u after Jan  Labs are way overdue   Wt Readings from Last 3 Encounters:  04/17/16 205 lb 4 oz (93.1 kg)  06/05/15 217 lb 8 oz (98.7 kg)  11/18/14 216 lb 8 oz (98.2 kg)  he is eating less and still exercising (treadmill 45 min daily)  Also using bow flex machine   (but not the past 2 weeks)  bmi is 33.1 Improved!  Has had a head cold  No fun  Has had it since Sat or Sunday   Needs a flu shot    bp is up today on first check Has not checked it at home  Takes lisinopril hct 10-12.5 - missed a few doses last week. Evelina Bucy it today  No cp or palpitations or headaches or edema  No side effects to medicines  BP Readings from Last 3 Encounters:  04/17/16 (!) 150/94  06/05/15 (!) 143/90  11/18/14 126/84     Hx of fatty liver Due for labs Lab Results  Component Value Date   ALT 66 (H) 11/18/2014   AST 43 (H) 11/18/2014   ALKPHOS 134 (H) 11/18/2014   BILITOT 0.4 11/18/2014  hoping with weight loss it will be improved    Hx of DM2 Lost to follow up  Missed his last appt and labs Lab Results  Component Value Date   HGBA1C 10.6 (H) 11/18/2014  some thirst  No excessive urination  Not checking his blood sugar  He checked out the DM ed program - thinks he would like to do it at [facility]  Metformin 500 bid  Glipizide xl 10 mg  No side effects or problems Ace for renal protection  Eye exam summer -no retinopathy    Hx of hyperlipidemia Lab Results  Component Value Date   CHOL 259 (H) 11/18/2014   HDL 43.60 11/18/2014   LDLCALC 176 (H) 11/18/2014   LDLDIRECT 176.9 12/08/2012   TRIG 199.0 (H) 11/18/2014   CHOLHDL 6 11/18/2014   Lost to f/u  Due for labs  On zocor 20 mg (but off of it for last labs)  Is back on it   Will have lab and flu shot today   Patient  Active Problem List   Diagnosis Date Noted  . Personal history of colonic adenomas 03/09/2013  . Prostate cancer screening 12/07/2012  . Routine general medical examination at a health care facility 08/23/2010  . HYPERTENSION, BENIGN ESSENTIAL 01/05/2010  . BACK PAIN, LUMBAR, WITH RADICULOPATHY 09/13/2008  . Obesity 09/08/2007  . TINEA PEDIS 07/16/2007  . Diabetes type 2, uncontrolled ([place]) 08/13/2006  . Hyperlipidemia 08/13/2006  . ALLERGY 08/13/2006  . Fatty liver 08/13/2006  . TOBACCO USE, QUIT 08/13/2006   Past Medical History:  Diagnosis Date  . Diabetes mellitus    Type II  . Hyperlipidemia   . Hypertension   . Obesity   . Personal history of colonic adenomas 03/09/2013  . Tinea pedis    Chronic   Past Surgical History:  Procedure Laterality Date  . myringectomy  06/2005  . UMBILICAL HERNIA REPAIR  01/1997  . VASECTOMY  07/2002   Social History  Substance Use Topics  . Smoking status: Former Smoker    Types: Cigarettes    Quit date: 04/09/2015  .  Smokeless tobacco: Never Used  . Alcohol use 0.0 oz/week     Comment: occassionally   Family History  Problem Relation Age of Onset  . Diabetes Neg Hx   . Heart disease Neg Hx   . Colon cancer Neg Hx    Allergies  Allergen Reactions  . Metformin And Related     diarrhea   Current Outpatient Prescriptions on File Prior to Visit  Medication Sig Dispense Refill  . Blood Glucose Monitoring Suppl (BLOOD GLUCOSE METER) kit To check glucose once daily and as needed for diabetes 1 each 0  . cyclobenzaprine (FLEXERIL) 10 MG tablet Take 1 tablet (10 mg total) by mouth 3 (three) times daily as needed for muscle spasms. 30 tablet 0  . Lancets MISC To check glucose once daily and as needed for diabetes 10 each 3  . traMADol (ULTRAM) 50 MG tablet Take 1 tablet (50 mg total) by mouth every 8 (eight) hours as needed. 30 tablet 0   No current facility-administered medications on file prior to visit.      Review of  Systems Review of Systems  Constitutional: Negative for fever, appetite change,  and unexpected weight change.  Eyes: Negative for pain and visual disturbance.  ENt pos for cong and rhinorrhea  Respiratory: Negative for cough and shortness of breath.   Cardiovascular: Negative for cp or palpitations    Gastrointestinal: Negative for nausea, diarrhea and constipation.  Genitourinary: Negative for urgency and frequency.  Skin: Negative for pallor or rash   Neurological: Negative for weakness, light-headedness, numbness and headaches.  Hematological: Negative for adenopathy. Does not bruise/bleed easily.  Psychiatric/Behavioral: Negative for dysphoric mood. The patient is not nervous/anxious.   Pos for fatigue from long work hours       Objective:   Physical Exam  Constitutional: He appears well-developed and well-nourished. No distress.  obese and well appearing   HENT:  Head: Normocephalic and atraumatic.  Mouth/Throat: Oropharynx is clear and moist.  Nares are injected and congested    Eyes: Conjunctivae and EOM are normal. Pupils are equal, round, and reactive to light.  Neck: Normal range of motion. Neck supple. No JVD present. Carotid bruit is not present. No thyromegaly present.  Cardiovascular: Normal rate, regular rhythm, normal heart sounds and intact distal pulses.  Exam reveals no gallop.   Pulmonary/Chest: Effort normal and breath sounds normal. No respiratory distress. He has no wheezes. He has no rales.  No crackles  Abdominal: Soft. Bowel sounds are normal. He exhibits no distension, no abdominal bruit and no mass. There is no tenderness.  Musculoskeletal: He exhibits no edema or tenderness.  Lymphadenopathy:    He has no cervical adenopathy.  Neurological: He is alert. He has normal reflexes.  Skin: Skin is warm and dry. No rash noted. No pallor.  Psychiatric: He has a normal mood and affect.          Assessment & Plan:   Problem List Items Addressed This  Visit      Cardiovascular and Mediastinum   HYPERTENSION, BENIGN ESSENTIAL - Primary    bp is not controlled Inc lisinopril hct to 20-25 mg daily  F/u in about a month for visit and labs DASH diet rev/ exercise enc  Enc to keep loosing wt       Relevant Medications   lisinopril-hydrochlorothiazide (PRINZIDE,ZESTORETIC) 20-25 MG tablet   simvastatin (ZOCOR) 20 MG tablet   Other Relevant Orders   CBC with Differential/Platelet   Comprehensive metabolic panel  TSH   Lipid panel     Digestive   Fatty liver    Lab today  Hope for imp with wt loss      Relevant Orders   Comprehensive metabolic panel     Endocrine   Diabetes type 2, uncontrolled ([place])    Pt has been lost to f/u  On metformin and glipizide  ? If he could tolerated larger dose of metformin if XL A1C today  ? If it will be up given wt loss  Lab Results  Component Value Date   HGBA1C 10.6 (H) 11/18/2014   Had his eye exam  Ref to DM teaching F/u 1 mo  Labs today for A1C      Relevant Medications   lisinopril-hydrochlorothiazide (PRINZIDE,ZESTORETIC) 20-25 MG tablet   glipiZIDE (GLIPIZIDE XL) 10 MG 24 hr tablet   simvastatin (ZOCOR) 20 MG tablet   metFORMIN (GLUCOPHAGE) 500 MG tablet   Other Relevant Orders   Ambulatory referral to diabetic education   Hemoglobin A1c     Other   Obesity    Commended on wt loss so far and exercise  However worried that out of control glucose could add to it   Discussed how this problem influences overall health and the risks it imposes  Reviewed plan for weight loss with lower calorie diet (via better food choices and also portion control or program like weight watchers) and exercise building up to or more than 30 minutes 5 days per week including some aerobic activity         Relevant Medications   glipiZIDE (GLIPIZIDE XL) 10 MG 24 hr tablet   metFORMIN (GLUCOPHAGE) 500 MG tablet   Hyperlipidemia    Last lipids high off simvastatin-now he is back on it  Diet  - fair  Lab today  Goal LDL under 100       Relevant Medications   lisinopril-hydrochlorothiazide (PRINZIDE,ZESTORETIC) 20-25 MG tablet   simvastatin (ZOCOR) 20 MG tablet   Other Relevant Orders   Lipid panel

## 2016-04-17 NOTE — Progress Notes (Signed)
Pre visit review using our clinic review tool, if applicable. No additional management support is needed unless otherwise documented below in the visit note. 

## 2016-06-10 ENCOUNTER — Ambulatory Visit: Payer: Managed Care, Other (non HMO) | Admitting: Family Medicine

## 2016-11-01 SURGERY — APPENDECTOMY, LAPAROSCOPIC
Anesthesia: General

## 2017-04-17 ENCOUNTER — Other Ambulatory Visit: Payer: Self-pay | Admitting: Family Medicine

## 2017-04-17 NOTE — Telephone Encounter (Signed)
Please schedule f/u and refill until then  Looks like he has had 7 out of 22 visits as no shows- this is a lot  Please express to him that if he needs to cancel appt to do it the day before so we can use his appt slot for someone else   Thanks

## 2017-04-17 NOTE — Telephone Encounter (Signed)
No recent or future appts., pt no showed his last appt (multiple no-shows) please advise

## 2017-04-18 NOTE — Telephone Encounter (Signed)
Pt advise he needs to keep this f/u appt due to multiple no shows, appt scheduled and med refilled

## 2017-05-26 ENCOUNTER — Ambulatory Visit (INDEPENDENT_AMBULATORY_CARE_PROVIDER_SITE_OTHER): Payer: Managed Care, Other (non HMO) | Admitting: Family Medicine

## 2017-05-26 ENCOUNTER — Encounter: Payer: Self-pay | Admitting: Family Medicine

## 2017-05-26 VITALS — BP 154/100 | HR 99 | Temp 98.6°F | Ht 66.0 in | Wt 212.0 lb

## 2017-05-26 DIAGNOSIS — B353 Tinea pedis: Secondary | ICD-10-CM

## 2017-05-26 DIAGNOSIS — K760 Fatty (change of) liver, not elsewhere classified: Secondary | ICD-10-CM

## 2017-05-26 DIAGNOSIS — E7800 Pure hypercholesterolemia, unspecified: Secondary | ICD-10-CM

## 2017-05-26 DIAGNOSIS — E6609 Other obesity due to excess calories: Secondary | ICD-10-CM | POA: Diagnosis not present

## 2017-05-26 DIAGNOSIS — E1165 Type 2 diabetes mellitus with hyperglycemia: Secondary | ICD-10-CM | POA: Diagnosis not present

## 2017-05-26 DIAGNOSIS — Z6833 Body mass index (BMI) 33.0-33.9, adult: Secondary | ICD-10-CM | POA: Diagnosis not present

## 2017-05-26 DIAGNOSIS — I10 Essential (primary) hypertension: Secondary | ICD-10-CM

## 2017-05-26 DIAGNOSIS — Z23 Encounter for immunization: Secondary | ICD-10-CM

## 2017-05-26 LAB — COMPREHENSIVE METABOLIC PANEL
ALBUMIN: 4.3 g/dL (ref 3.5–5.2)
ALK PHOS: 142 U/L — AB (ref 39–117)
ALT: 49 U/L (ref 0–53)
AST: 49 U/L — ABNORMAL HIGH (ref 0–37)
BILIRUBIN TOTAL: 0.6 mg/dL (ref 0.2–1.2)
BUN: 14 mg/dL (ref 6–23)
CALCIUM: 9.6 mg/dL (ref 8.4–10.5)
CO2: 27 mEq/L (ref 19–32)
Chloride: 98 mEq/L (ref 96–112)
Creatinine, Ser: 0.76 mg/dL (ref 0.40–1.50)
GFR: 135.59 mL/min (ref >60.00)
Glucose, Bld: 284 mg/dL — ABNORMAL HIGH (ref 70–99)
POTASSIUM: 4.3 meq/L (ref 3.5–5.1)
Sodium: 135 mEq/L (ref 135–145)
TOTAL PROTEIN: 7.4 g/dL (ref 6.0–8.3)

## 2017-05-26 LAB — TSH: TSH: 2.68 u[IU]/mL (ref 0.35–4.50)

## 2017-05-26 LAB — LIPID PANEL
CHOLESTEROL: 253 mg/dL — AB (ref 0–200)
HDL: 60.5 mg/dL (ref >39.00)
LDL Cholesterol: 162 mg/dL — ABNORMAL HIGH (ref 0–99)
NonHDL: 192.58
TRIGLYCERIDES: 153 mg/dL — AB (ref 0.0–149.0)
Total CHOL/HDL Ratio: 4
VLDL: 30.6 mg/dL (ref 0.0–40.0)

## 2017-05-26 LAB — CBC WITH DIFFERENTIAL/PLATELET
Basophils Absolute: 0.1 10*3/uL (ref 0.0–0.1)
Basophils Relative: 1 % (ref 0.0–3.0)
EOS PCT: 6 % — AB (ref 0.0–5.0)
Eosinophils Absolute: 0.5 10*3/uL (ref 0.0–0.7)
HCT: 46.4 % (ref 39.0–52.0)
HEMOGLOBIN: 15.9 g/dL (ref 13.0–17.0)
LYMPHS ABS: 1.5 10*3/uL (ref 0.7–4.0)
Lymphocytes Relative: 17.9 % (ref 12.0–46.0)
MCHC: 34.3 g/dL (ref 30.0–36.0)
MCV: 103.4 fl — AB (ref 78.0–100.0)
MONOS PCT: 9.2 % (ref 3.0–12.0)
Monocytes Absolute: 0.8 10*3/uL (ref 0.1–1.0)
NEUTROS PCT: 65.9 % (ref 43.0–77.0)
Neutro Abs: 5.7 10*3/uL (ref 1.4–7.7)
Platelets: 197 10*3/uL (ref 150.0–400.0)
RBC: 4.49 Mil/uL (ref 4.22–5.81)
RDW: 14.2 % (ref 11.5–15.5)
WBC: 8.6 10*3/uL (ref 4.0–10.5)

## 2017-05-26 LAB — HEMOGLOBIN A1C: Hgb A1c MFr Bld: 9.7 % — ABNORMAL HIGH (ref 4.6–6.5)

## 2017-05-26 MED ORDER — GLIPIZIDE ER 10 MG PO TB24: ORAL_TABLET | ORAL | 0 refills | Status: DC

## 2017-05-26 MED ORDER — SIMVASTATIN 20 MG PO TABS: ORAL_TABLET | ORAL | 0 refills | Status: DC

## 2017-05-26 MED ORDER — METFORMIN HCL 500 MG PO TABS: ORAL_TABLET | ORAL | 0 refills | Status: DC

## 2017-05-26 MED ORDER — LISINOPRIL-HYDROCHLOROTHIAZIDE 20-25 MG PO TABS: 1.0000 | ORAL_TABLET | Freq: Every day | ORAL | 11 refills | Status: DC

## 2017-05-26 NOTE — Assessment & Plan Note (Signed)
Lost to f/u  Noncompliant with lifestyle/medication and f/u  Lab Results  Component Value Date   HGBA1C 12.4 Repeated and verified X2. (H) 04/17/2016   Lab today  Ref to dm teaching  Get supplies to check glucose F/u 1 mo  Wt loss enc  Will send for eye exam-per pt utd

## 2017-05-26 NOTE — Assessment & Plan Note (Signed)
Lab today  Wt gain noted Disc need for wt loss/lower fat diet

## 2017-05-26 NOTE — Assessment & Plan Note (Signed)
Reminded to start using otc antifungal again  Also trim nails

## 2017-05-26 NOTE — Assessment & Plan Note (Signed)
Discussed how this problem influences overall health and the risks it imposes  Reviewed plan for weight loss with lower calorie diet (via better food choices and also portion control or program like weight watchers) and exercise building up to or more than 30 minutes 5 days per week including some aerobic activity    

## 2017-05-26 NOTE — Assessment & Plan Note (Signed)
Not controlled  Noncompliant with visits/meds Will get back on track with diet/exercise and lisinopril-hct  F/u about a month Labs today

## 2017-05-26 NOTE — Patient Instructions (Signed)
Flu shot today  Labs today  Get back on all your medicine  Set a twice daily cell phone alarm to take all your medicines-do not miss doses!  Get back to exercise  We will refer you to diabetic education  Call your insurance and find out what Brand of glucose meter/strips and lancets are covered so we can order them  Follow up in about a month  Take your medicine so we see how blood pressure is

## 2017-05-26 NOTE — Assessment & Plan Note (Signed)
Not compliant with zocor  Disc this Also diabetic Lab today  Disc goals for lipids and reasons to control them Rev labs with pt  (last check) Rev low sat fat diet in detail

## 2017-05-26 NOTE — Progress Notes (Signed)
Subjective:    Patient ID: Zachary Lewis, male    DOB: 05-Nov-1960, 57 y.o.   MRN: [id number]  HPI Here for f/u of chronic health problems   He has been lost to follow up - health is not on his radar Has been doing fine  ? 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 If it was controlled with medicine or not  ? If controlled with medicine   Wt is up with hx of fatty liver Lab Results  Component Value Date   ALT 51 04/17/2016   AST 41 (H) 04/17/2016   ALKPHOS 168 (H) 04/17/2016   BILITOT 0.5 04/17/2016   due for labs   DM2 Lab Results  Component Value Date   HGBA1C 12.4 Repeated and verified X2. (H) 04/17/2016   Lost to f/u after this  Was ref to DM teaching - he did not follow through  Still taking metformin and glipizide  He does not check blood glucose  Needs to start doing this   Hyperlipidemia Lab Results  Component Value Date   CHOL 268 (H) 04/17/2016   HDL 56.60 04/17/2016   LDLCALC 183 (H) 04/17/2016   LDLDIRECT 176.9 12/08/2012   TRIG 139.0 04/17/2016   CHOLHDL 5 04/17/2016  takes zocor - he only gets it twice a week (he forgets or falls asleep)   Patient Active Problem List   Diagnosis Date Noted  . Personal history of colonic adenomas 03/09/2013  . Prostate cancer screening 12/07/2012  . Routine general medical examination at a health care facility 08/23/2010  . HYPERTENSION, BENIGN ESSENTIAL 01/05/2010   . BACK PAIN, LUMBAR, WITH RADICULOPATHY 09/13/2008  . Obesity 09/08/2007  . TINEA PEDIS 07/16/2007  . Diabetes type 2, uncontrolled ([place]) 08/13/2006  . Hyperlipidemia 08/13/2006  . ALLERGY 08/13/2006  . Fatty liver 08/13/2006  . TOBACCO USE, QUIT 08/13/2006   Past Medical History:  Diagnosis Date  . Diabetes mellitus    Type II  . Hyperlipidemia   . Hypertension   . Obesity   . Personal history of colonic adenomas 03/09/2013  . Tinea pedis    Chronic   Past Surgical History:  Procedure Laterality Date  . myringectomy  06/2005  . UMBILICAL HERNIA REPAIR  01/1997  . VASECTOMY  07/2002   Social History   Tobacco Use  . Smoking status: Former Smoker    Types: Cigarettes    Last attempt to quit: 04/09/2015    Years since quitting: 2.1  . Smokeless tobacco: Never Used  Substance Use Topics  . Alcohol use: Yes    Alcohol/week: 0.0 oz    Comment: occassionally  . Drug use: No   Family History  Problem Relation Age of Onset  . Diabetes Neg Hx   .  Heart disease Neg Hx   . Colon cancer Neg Hx    Allergies  Allergen Reactions  . Metformin And Related     diarrhea   Current Outpatient Medications on File Prior to Visit  Medication Sig Dispense Refill  . Blood Glucose Monitoring Suppl (BLOOD GLUCOSE METER) kit To check glucose once daily and as needed for diabetes 1 each 0  . Lancets MISC To check glucose once daily and as needed for diabetes 10 each 3   No current facility-administered medications on file prior to visit.      Review of Systems  Constitutional: Positive for fatigue. Negative for activity change, appetite change, fever and unexpected weight change.  HENT: Negative for congestion, rhinorrhea, sore throat and trouble swallowing.   Eyes: Negative for pain, redness, itching and visual disturbance.  Respiratory: Negative for cough, chest tightness, shortness of breath and wheezing.   Cardiovascular: Negative for chest pain and palpitations.    Gastrointestinal: Negative for abdominal pain, blood in stool, constipation, diarrhea and nausea.  Endocrine: Negative for cold intolerance, heat intolerance, polydipsia and polyuria.  Genitourinary: Negative for difficulty urinating, dysuria, frequency and urgency.  Musculoskeletal: Negative for arthralgias, joint swelling and myalgias.  Skin: Negative for pallor and rash.  Neurological: Negative for dizziness, tremors, weakness, numbness and headaches.  Hematological: Negative for adenopathy. Does not bruise/bleed easily.  Psychiatric/Behavioral: Negative for decreased concentration and dysphoric mood. The patient is not nervous/anxious.        Pos for stressors        Objective:   Physical Exam  Constitutional: He appears well-developed and well-nourished. No distress.  obese and well appearing   HENT:  Head: Normocephalic and atraumatic.  Mouth/Throat: Oropharynx is clear and moist.  Eyes: Conjunctivae and EOM are normal. Pupils are equal, round, and reactive to light.  Neck: Normal range of motion. Neck supple. No JVD present. Carotid bruit is not present. No thyromegaly present.  Cardiovascular: Normal rate, regular rhythm, normal heart sounds and intact distal pulses. Exam reveals no gallop.  Pulmonary/Chest: Effort normal and breath sounds normal. No respiratory distress. He has no wheezes. He has no rales.  No crackles  Abdominal: Soft. Bowel sounds are normal. He exhibits no distension, no abdominal bruit and no mass. There is no tenderness.  Musculoskeletal: He exhibits no edema.  Lymphadenopathy:    He has no cervical adenopathy.  Neurological: He is alert. He has normal reflexes. No cranial nerve deficit. Coordination normal.  Skin: Skin is warm and dry. No rash noted. No pallor.  Peeling of skin on feet from tinea  Overgrown nails/thickened  Nl senstation  Psychiatric: He has a normal mood and affect.          Assessment & Plan:   Problem List Items Addressed  This Visit      Cardiovascular and Mediastinum   HYPERTENSION, BENIGN ESSENTIAL    Not controlled  Noncompliant with visits/meds Will get back on track with diet/exercise and lisinopril-hct  F/u about a month Labs today      Relevant Medications   simvastatin (ZOCOR) 20 MG tablet   lisinopril-hydrochlorothiazide (PRINZIDE,ZESTORETIC) 20-25 MG tablet   Other Relevant Orders   CBC with Differential/Platelet   Comprehensive metabolic panel   Lipid panel   TSH     Digestive   Fatty liver    Lab today  Wt gain noted Disc need for wt loss/lower fat diet        Relevant Orders   Comprehensive metabolic panel  Endocrine   Diabetes type 2, uncontrolled ([place]) - Primary    Lost to f/u  Noncompliant with lifestyle/medication and f/u  Lab Results  Component Value Date   HGBA1C 12.4 Repeated and verified X2. (H) 04/17/2016   Lab today  Ref to dm teaching  Get supplies to check glucose F/u 1 mo  Wt loss enc  Will send for eye exam-per pt utd      Relevant Medications   simvastatin (ZOCOR) 20 MG tablet   metFORMIN (GLUCOPHAGE) 500 MG tablet   lisinopril-hydrochlorothiazide (PRINZIDE,ZESTORETIC) 20-25 MG tablet   glipiZIDE (GLUCOTROL XL) 10 MG 24 hr tablet   Other Relevant Orders   Ambulatory referral to diabetic education   Hemoglobin A1c     Musculoskeletal and Integument   TINEA PEDIS    Reminded to start using otc antifungal again  Also trim nails         Other   Hyperlipidemia    Not compliant with zocor  Disc this Also diabetic Lab today  Disc goals for lipids and reasons to control them Rev labs with pt  (last check) Rev low sat fat diet in detail       Relevant Medications   simvastatin (ZOCOR) 20 MG tablet   lisinopril-hydrochlorothiazide (PRINZIDE,ZESTORETIC) 20-25 MG tablet   Other Relevant Orders   Lipid panel   Obesity    Discussed how this problem influences overall health and the risks it imposes  Reviewed plan for weight loss with  lower calorie diet (via better food choices and also portion control or program like weight watchers) and exercise building up to or more than 30 minutes 5 days per week including some aerobic activity         Relevant Medications   metFORMIN (GLUCOPHAGE) 500 MG tablet   glipiZIDE (GLUCOTROL XL) 10 MG 24 hr tablet    Other Visit Diagnoses    Need for influenza vaccination       Relevant Orders   Flu Vaccine QUAD 6+ mos PF IM (Fluarix Quad PF) (Completed)

## 2017-06-30 ENCOUNTER — Encounter: Payer: Self-pay | Admitting: Family Medicine

## 2017-06-30 ENCOUNTER — Ambulatory Visit (INDEPENDENT_AMBULATORY_CARE_PROVIDER_SITE_OTHER): Payer: Managed Care, Other (non HMO) | Admitting: Family Medicine

## 2017-06-30 VITALS — BP 120/78 | HR 93 | Temp 98.5°F | Resp 16 | Ht 66.0 in | Wt 210.2 lb

## 2017-06-30 DIAGNOSIS — E66811 Obesity, class 1: Secondary | ICD-10-CM

## 2017-06-30 DIAGNOSIS — K760 Fatty (change of) liver, not elsewhere classified: Secondary | ICD-10-CM

## 2017-06-30 DIAGNOSIS — E7800 Pure hypercholesterolemia, unspecified: Secondary | ICD-10-CM | POA: Diagnosis not present

## 2017-06-30 DIAGNOSIS — E6609 Other obesity due to excess calories: Secondary | ICD-10-CM | POA: Diagnosis not present

## 2017-06-30 DIAGNOSIS — Z6833 Body mass index (BMI) 33.0-33.9, adult: Secondary | ICD-10-CM | POA: Diagnosis not present

## 2017-06-30 DIAGNOSIS — E1165 Type 2 diabetes mellitus with hyperglycemia: Secondary | ICD-10-CM | POA: Diagnosis not present

## 2017-06-30 DIAGNOSIS — I10 Essential (primary) hypertension: Secondary | ICD-10-CM

## 2017-06-30 NOTE — Patient Instructions (Addendum)
Blood pressure looks much better  Continue current medicines   We will refer you to diabetic teaching at [facility]   See you back in 2 months - after April 7   Take care of yourself  Keep working on diet and exercise and weight loss

## 2017-06-30 NOTE — Assessment & Plan Note (Signed)
Much improved back on medicine bp in fair control at this time  BP Readings from Last 1 Encounters:  06/30/17 120/78   No changes needed Disc lifstyle change with low sodium diet and exercise  Also enc him to keep up better health habits  F/u 2 mo

## 2017-06-30 NOTE — Progress Notes (Signed)
Subjective:    Patient ID: Zachary Lewis, male    DOB: Mar 07, 1961, 57 y.o.   MRN: [id number]  HPI Here for f/u of chronic health problems   Last visit had been lost to f/u  Wt Readings from Last 3 Encounters:  06/30/17 210 lb 4 oz (95.4 kg)  05/26/17 212 lb (96.2 kg)  04/17/16 205 lb 4 oz (93.1 kg)  he is changing eating habits - eating more salads/ veggies  Has cut out red meat (eating fish and chicken)  Exercise- is using the treadmill again  33.94 kg/m   bp is stable today  No cp or palpitations or headaches or edema  No side effects to medicines  BP Readings from Last 3 Encounters:  06/30/17 120/78  05/26/17 (!) 154/100  04/17/16 (!) 150/94    Much improved back on medication today! - feels better for sure  Lisinopril hct   Fatty liver Disc diet for this  Lab Results  Component Value Date   ALT 49 05/26/2017   AST 49 (H) 05/26/2017   ALKPHOS 142 (H) 05/26/2017   BILITOT 0.6 05/26/2017     DM2 Lab Results  Component Value Date   HGBA1C 9.7 (H) 05/26/2017  has not been checking blood sugar at all  Watching carbs and sugar closely -thinks things will be improved  He occ drinks etoh-not often  He was ref to DM teaching last time  On metformin 500 bid  Glipizide xl 10 mg daily   Hyperlipidemia Lab Results  Component Value Date   CHOL 253 (H) 05/26/2017   HDL 60.50 05/26/2017   LDLCALC 162 (H) 05/26/2017   LDLDIRECT 176.9 12/08/2012   TRIG 153.0 (H) 05/26/2017   CHOLHDL 4 05/26/2017   He was off his simvasatin at the time of check   Patient Active Problem List   Diagnosis Date Noted  . Personal history of colonic adenomas 03/09/2013  . Prostate cancer screening 12/07/2012  . Routine general medical examination at a health care facility 08/23/2010  . HYPERTENSION, BENIGN ESSENTIAL 01/05/2010  . BACK PAIN, LUMBAR, WITH RADICULOPATHY 09/13/2008  . Obesity 09/08/2007  . TINEA PEDIS 07/16/2007  . Diabetes type 2, uncontrolled ([place]) 08/13/2006  .  Hyperlipidemia 08/13/2006  . ALLERGY 08/13/2006  . Fatty liver 08/13/2006  . TOBACCO USE, QUIT 08/13/2006   Past Medical History:  Diagnosis Date  . Diabetes mellitus    Type II  . Hyperlipidemia   . Hypertension   . Obesity   . Personal history of colonic adenomas 03/09/2013  . Tinea pedis    Chronic   Past Surgical History:  Procedure Laterality Date  . myringectomy  06/2005  . UMBILICAL HERNIA REPAIR  01/1997  . VASECTOMY  07/2002   Social History   Tobacco Use  . Smoking status: Former Smoker    Types: Cigarettes    Last attempt to quit: 04/09/2015    Years since quitting: 2.2  . Smokeless tobacco: Never Used  Substance Use Topics  . Alcohol use: Yes    Alcohol/week: 0.0 oz    Comment: occassionally  . Drug use: No   Family History  Problem Relation Age of Onset  . Diabetes Neg Hx   . Heart disease Neg Hx   . Colon cancer Neg Hx    Allergies  Allergen Reactions  . Metformin And Related     diarrhea   Current Outpatient Medications on File Prior to Visit  Medication Sig Dispense Refill  . Blood Glucose Monitoring  Suppl (BLOOD GLUCOSE METER) kit To check glucose once daily and as needed for diabetes 1 each 0  . glipiZIDE (GLUCOTROL XL) 10 MG 24 hr tablet TAKE 1 TABLET(10 MG) BY MOUTH DAILY 90 tablet 0  . Lancets MISC To check glucose once daily and as needed for diabetes 10 each 3  . lisinopril-hydrochlorothiazide (PRINZIDE,ZESTORETIC) 20-25 MG tablet Take 1 tablet by mouth daily. 30 tablet 11  . metFORMIN (GLUCOPHAGE) 500 MG tablet TAKE 1 TABLET(500 MG) BY MOUTH TWICE DAILY WITH A MEAL 180 tablet 0  . simvastatin (ZOCOR) 20 MG tablet TAKE 1 TABLET(20 MG) BY MOUTH AT BEDTIME 90 tablet 0   No current facility-administered medications on file prior to visit.     Review of Systems  Constitutional: Negative for activity change, appetite change, fatigue, fever and unexpected weight change.  HENT: Negative for congestion, rhinorrhea, sore throat and trouble  swallowing.   Eyes: Negative for pain, redness, itching and visual disturbance.  Respiratory: Negative for cough, chest tightness, shortness of breath and wheezing.   Cardiovascular: Negative for chest pain and palpitations.  Gastrointestinal: Negative for abdominal pain, blood in stool, constipation, diarrhea and nausea.  Endocrine: Negative for cold intolerance, heat intolerance, polydipsia and polyuria.  Genitourinary: Negative for difficulty urinating, dysuria, frequency and urgency.  Musculoskeletal: Negative for arthralgias, joint swelling and myalgias.  Skin: Negative for pallor and rash.  Neurological: Negative for dizziness, tremors, weakness, numbness and headaches.  Hematological: Negative for adenopathy. Does not bruise/bleed easily.  Psychiatric/Behavioral: Negative for decreased concentration and dysphoric mood. The patient is not nervous/anxious.        Objective:   Physical Exam  Constitutional: He appears well-developed and well-nourished. No distress.  obese and well appearing   HENT:  Head: Normocephalic and atraumatic.  Mouth/Throat: Oropharynx is clear and moist.  Eyes: Conjunctivae and EOM are normal. Pupils are equal, round, and reactive to light.  Neck: Normal range of motion. Neck supple. No JVD present. Carotid bruit is not present. No thyromegaly present.  Cardiovascular: Normal rate, regular rhythm, normal heart sounds and intact distal pulses. Exam reveals no gallop.  Pulmonary/Chest: Effort normal and breath sounds normal. No respiratory distress. He has no wheezes. He has no rales.  No crackles  Abdominal: Soft. Bowel sounds are normal. He exhibits no distension, no abdominal bruit and no mass. There is no tenderness.  Musculoskeletal: He exhibits no edema.  Lymphadenopathy:    He has no cervical adenopathy.  Neurological: He is alert. He has normal reflexes.  Skin: Skin is warm and dry. No rash noted.  Psychiatric: He has a normal mood and affect.           Assessment & Plan:   Problem List Items Addressed This Visit      Cardiovascular and Mediastinum   HYPERTENSION, BENIGN ESSENTIAL - Primary    Much improved back on medicine bp in fair control at this time  BP Readings from Last 1 Encounters:  06/30/17 120/78   No changes needed Disc lifstyle change with low sodium diet and exercise  Also enc him to keep up better health habits  F/u 2 mo        Digestive   Fatty liver    Doing better with diet/exercise Enc wt loss Lab 2 mo with f.u  Transaminases rev -mildly elevated         Endocrine   Diabetes type 2, uncontrolled ([facility])    Lab Results  Component Value Date   HGBA1C 9.7 (H) 05/26/2017  Better diet and exercise Back on metformin and glipizide and ace  Labs and f/u 2 mo      Relevant Orders   Ambulatory referral to diabetic education     Other   Hyperlipidemia    Disc goals for lipids and reasons to control them Rev labs with pt (up when off medication) Now back on statin and diet  Rev low sat fat diet in detail  Re check 2 mo      Obesity    Discussed how this problem influences overall health and the risks it imposes  Reviewed plan for weight loss with lower calorie diet (via better food choices and also portion control or program like weight watchers) and exercise building up to or more than 30 minutes 5 days per week including some aerobic activity   Commended efforts so far

## 2017-06-30 NOTE — Assessment & Plan Note (Signed)
Doing better with diet/exercise Enc wt loss Lab 2 mo with f.u  Transaminases rev -mildly elevated

## 2017-06-30 NOTE — Assessment & Plan Note (Signed)
Lab Results  Component Value Date   HGBA1C 9.7 (H) 05/26/2017   Better diet and exercise Back on metformin and glipizide and ace  Labs and f/u 2 mo

## 2017-06-30 NOTE — Assessment & Plan Note (Signed)
Disc goals for lipids and reasons to control them Rev labs with pt (up when off medication) Now back on statin and diet  Rev low sat fat diet in detail  Re check 2 mo

## 2017-06-30 NOTE — Assessment & Plan Note (Signed)
Discussed how this problem influences overall health and the risks it imposes  Reviewed plan for weight loss with lower calorie diet (via better food choices and also portion control or program like weight watchers) and exercise building up to or more than 30 minutes 5 days per week including some aerobic activity   Commended efforts so far

## 2017-09-19 ENCOUNTER — Other Ambulatory Visit: Payer: Managed Care, Other (non HMO)

## 2017-09-24 ENCOUNTER — Ambulatory Visit: Payer: Managed Care, Other (non HMO) | Admitting: Family Medicine

## 2018-01-12 ENCOUNTER — Ambulatory Visit (INDEPENDENT_AMBULATORY_CARE_PROVIDER_SITE_OTHER): Payer: Managed Care, Other (non HMO) | Admitting: Family Medicine

## 2018-01-12 ENCOUNTER — Encounter: Payer: Self-pay | Admitting: Family Medicine

## 2018-01-12 VITALS — BP 176/108 | HR 89 | Temp 98.5°F | Ht 66.0 in | Wt 205.8 lb

## 2018-01-12 DIAGNOSIS — F17200 Nicotine dependence, unspecified, uncomplicated: Secondary | ICD-10-CM

## 2018-01-12 DIAGNOSIS — E6609 Other obesity due to excess calories: Secondary | ICD-10-CM

## 2018-01-12 DIAGNOSIS — E1165 Type 2 diabetes mellitus with hyperglycemia: Secondary | ICD-10-CM | POA: Diagnosis not present

## 2018-01-12 DIAGNOSIS — E7800 Pure hypercholesterolemia, unspecified: Secondary | ICD-10-CM | POA: Diagnosis not present

## 2018-01-12 DIAGNOSIS — I10 Essential (primary) hypertension: Secondary | ICD-10-CM | POA: Diagnosis not present

## 2018-01-12 DIAGNOSIS — Z6833 Body mass index (BMI) 33.0-33.9, adult: Secondary | ICD-10-CM

## 2018-01-12 LAB — LIPID PANEL
CHOL/HDL RATIO: 3
Cholesterol: 235 mg/dL — ABNORMAL HIGH (ref 0–200)
HDL: 74.4 mg/dL (ref >39.00)
LDL CALC: 140 mg/dL — AB (ref 0–99)
NONHDL: 160.85
Triglycerides: 106 mg/dL (ref 0.0–149.0)
VLDL: 21.2 mg/dL (ref 0.0–40.0)

## 2018-01-12 LAB — HEMOGLOBIN A1C: Hgb A1c MFr Bld: 8.1 % — ABNORMAL HIGH (ref 4.6–6.5)

## 2018-01-12 LAB — COMPREHENSIVE METABOLIC PANEL
ALT: 24 U/L (ref 0–53)
AST: 23 U/L (ref 0–37)
Albumin: 4.2 g/dL (ref 3.5–5.2)
Alkaline Phosphatase: 111 U/L (ref 39–117)
BUN: 12 mg/dL (ref 6–23)
CHLORIDE: 101 meq/L (ref 96–112)
CO2: 27 mEq/L (ref 19–32)
CREATININE: 0.85 mg/dL (ref 0.40–1.50)
Calcium: 9.7 mg/dL (ref 8.4–10.5)
GFR: 118.9 mL/min (ref >60.00)
GLUCOSE: 207 mg/dL — AB (ref 70–99)
Potassium: 3.6 mEq/L (ref 3.5–5.1)
Sodium: 137 mEq/L (ref 135–145)
Total Bilirubin: 0.5 mg/dL (ref 0.2–1.2)
Total Protein: 7.6 g/dL (ref 6.0–8.3)

## 2018-01-12 NOTE — Assessment & Plan Note (Signed)
Discussed how this problem influences overall health and the risks it imposes  Reviewed plan for weight loss with lower calorie diet (via better food choices and also portion control or program like weight watchers) and exercise building up to or more than 30 minutes 5 days per week including some aerobic activity    

## 2018-01-12 NOTE — Assessment & Plan Note (Signed)
Disc goals for lipids and reasons to control them Rev last labs with pt Rev low sat fat diet in detail  Missed simvastatin last week  May affect his labs

## 2018-01-12 NOTE — Patient Instructions (Addendum)
Stop smoking as soon as you can   Set you your annual eye exam   Labs today   Keep working on diabetic diet and exercise  Try to get most of your carbohydrates from produce (with the exception of white potatoes)  Eat less bread/pasta/rice/snack foods/cereals/sweets and other items from the middle of the grocery store (processed carbs)   Keep exercising   Follow up in 1-2 weeks to re check blood pressure  Take your medicine every day

## 2018-01-12 NOTE — Assessment & Plan Note (Signed)
Disc in detail risks of smoking and possible outcomes including copd, vascular/ heart disease, cancer , respiratory and sinus infections  Pt voices understanding  Disc how this can also affect erectile fxn Plans to quit soon (2-3 cig per day)

## 2018-01-12 NOTE — Progress Notes (Signed)
Subjective:    Patient ID: Zachary Lewis, male    DOB: 1960/11/06, 57 y.o.   MRN: [id number]  HPI Here for f/u of chronic health problems   Wt Readings from Last 3 Encounters:  01/12/18 205 lb 12.8 oz (93.4 kg)  06/30/17 210 lb 4 oz (95.4 kg)  05/26/17 212 lb (96.2 kg)  has been working hard at it  [employer] the track 4-5 d per week and uses an exercise bike and lifting weights  33.22 kg/m   Smoking status  Back to smoking - 3-4 weeks (2-3 cig per day)  Started as a bachelor party   More ED lately  ? Due to bp or smoking    bp is up today  He missed a few days of bp medicine (last Thursday till this am) -he was on vacation  No cp or palpitations or headaches or edema  No side effects to medicines  BP Readings from Last 3 Encounters:  01/12/18 (!) 176/108  06/30/17 120/78  05/26/17 (!) 154/100     Due for labs  Lab Results  Component Value Date   CREATININE 0.76 05/26/2017   BUN 14 05/26/2017   NA 135 05/26/2017   K 4.3 05/26/2017   CL 98 05/26/2017   CO2 27 05/26/2017    DM2  (loast to f/u last time)  Lab Results  Component Value Date   HGBA1C 9.7 (H) 05/26/2017  watching carbs - no bread  Metformin - missed this week but took until then  Glipizide-missed this week but took until then  Ace  Eye exam - about a year ago    Hyperlipidemia Lab Results  Component Value Date   CHOL 253 (H) 05/26/2017   HDL 60.50 05/26/2017   LDLCALC 162 (H) 05/26/2017   LDLDIRECT 176.9 12/08/2012   TRIG 153.0 (H) 05/26/2017   CHOLHDL 4 05/26/2017  simvastatin and diet (missed for a week)   Also stopped eating red meat    Review of Systems  Constitutional: Negative for activity change, appetite change, fatigue, fever and unexpected weight change.  HENT: Negative for congestion, rhinorrhea, sore throat and trouble swallowing.   Eyes: Negative for pain, redness, itching and visual disturbance.  Respiratory: Negative for cough, chest tightness, shortness of breath and  wheezing.   Cardiovascular: Negative for chest pain and palpitations.  Gastrointestinal: Negative for abdominal pain, blood in stool, constipation, diarrhea and nausea.  Endocrine: Negative for cold intolerance, heat intolerance, polydipsia and polyuria.  Genitourinary: Negative for difficulty urinating, dysuria, frequency and urgency.       ED  Musculoskeletal: Negative for arthralgias, joint swelling and myalgias.  Skin: Negative for pallor and rash.  Neurological: Negative for dizziness, tremors, weakness, numbness and headaches.  Hematological: Negative for adenopathy. Does not bruise/bleed easily.  Psychiatric/Behavioral: Negative for decreased concentration and dysphoric mood. The patient is not nervous/anxious.        Objective:   Physical Exam  Constitutional: He appears well-developed and well-nourished. No distress.  obese and well appearing   HENT:  Head: Normocephalic and atraumatic.  Mouth/Throat: Oropharynx is clear and moist.  Eyes: Pupils are equal, round, and reactive to light. Conjunctivae and EOM are normal. No scleral icterus.  Neck: Normal range of motion. Neck supple. No JVD present. Carotid bruit is not present. No thyromegaly present.  Cardiovascular: Normal rate, regular rhythm, normal heart sounds and intact distal pulses. Exam reveals no gallop.  Pulmonary/Chest: Effort normal and breath sounds normal. No respiratory distress. He has no  wheezes. He has no rales.  No crackles  Abdominal: Soft. Bowel sounds are normal. He exhibits no distension, no abdominal bruit and no mass. There is no tenderness.  Musculoskeletal: He exhibits no edema.  Lymphadenopathy:    He has no cervical adenopathy.  Neurological: He is alert. He has normal reflexes. He displays normal reflexes. No cranial nerve deficit. Coordination normal.  Skin: Skin is warm and dry. No rash noted.  Skin peeling on feet/tinea is improve   Psychiatric: He has a normal mood and affect.           Assessment & Plan:   Problem List Items Addressed This Visit      Cardiovascular and Mediastinum   HYPERTENSION, BENIGN ESSENTIAL - Primary    bp is up considerably after 5 days of missed medication (did take lisinopril hct today) BP Readings from Last 1 Encounters:  01/12/18 (!) 176/108   No changes needed- urged to be more compliant Will f/u 1-2 wk for re check  Wt loss enc Most recent labs reviewed  Disc lifstyle change with low sodium diet and exercise        Relevant Orders   Comprehensive metabolic panel     Endocrine   Diabetes type 2, uncontrolled ([place])    Lab today after loosing to f/u Has lost wt/working on diet and exercise  A1C and cmet today  Did miss 5 d of metformin and glipizide-is back on track  Wt loss enc He will schedule his own eye exam Disc foot care      Relevant Orders   Hemoglobin A1c     Other   Hyperlipidemia    Disc goals for lipids and reasons to control them Rev last labs with pt Rev low sat fat diet in detail  Missed simvastatin last week  May affect his labs      Relevant Orders   Lipid panel   Obesity    Discussed how this problem influences overall health and the risks it imposes  Reviewed plan for weight loss with lower calorie diet (via better food choices and also portion control or program like weight watchers) and exercise building up to or more than 30 minutes 5 days per week including some aerobic activity         Smoking    Disc in detail risks of smoking and possible outcomes including copd, vascular/ heart disease, cancer , respiratory and sinus infections  Pt voices understanding  Disc how this can also affect erectile fxn Plans to quit soon (2-3 cig per day)

## 2018-01-12 NOTE — Assessment & Plan Note (Signed)
bp is up considerably after 5 days of missed medication (did take lisinopril hct today) BP Readings from Last 1 Encounters:  01/12/18 (!) 176/108   No changes needed- urged to be more compliant Will f/u 1-2 wk for re check  Wt loss enc Most recent labs reviewed  Disc lifstyle change with low sodium diet and exercise

## 2018-01-12 NOTE — Assessment & Plan Note (Signed)
Lab today after loosing to f/u Has lost wt/working on diet and exercise  A1C and cmet today  Did miss 5 d of metformin and glipizide-is back on track  Wt loss enc He will schedule his own eye exam Disc foot care

## 2018-01-23 ENCOUNTER — Ambulatory Visit: Payer: Managed Care, Other (non HMO) | Admitting: Family Medicine

## 2018-01-26 ENCOUNTER — Ambulatory Visit: Payer: Managed Care, Other (non HMO) | Admitting: Family Medicine

## 2018-01-26 DIAGNOSIS — Z0289 Encounter for other administrative examinations: Secondary | ICD-10-CM

## 2018-02-02 ENCOUNTER — Ambulatory Visit: Payer: Managed Care, Other (non HMO) | Admitting: Family Medicine

## 2018-05-26 ENCOUNTER — Ambulatory Visit: Payer: Self-pay

## 2018-05-26 NOTE — Telephone Encounter (Signed)
Incoming call from  Patient who complains of feeling  tiredblood sugar  Low and testicals sore and unable to achieve erection.  Triaged Patient for low blood sugar.  During call Patient states he has to hang up. Instructed  Patient to to call back.  Answer Assessment - Initial Assessment Questions 1. SYMPTOMS: "What symptoms are you concerned about?"     Tired no energy 2. ONSET:  "When did the symptoms start?"     During the Holidays the week during Christmas 3. BLOOD GLUCOSE: "What is your blood glucose level?"      Didn't test blood sugar dont have a machine 4. USUAL RANGE: "What is your blood glucose level usually?" (e.g., usual fasting morning  value, usual evening value)     *No Answer* 5. TYPE 1 or 2:  "Do you know what type of diabetes you have?"  (e.g., Type 1, Type 2, Gestational; doesn't know)      *No Answer* 6. INSULIN: "Do you take insulin?" "What type of insulin(s) do you use? What is the mode of delivery? (syringe, pen (e.g., injection or  pump)      No just the pills 7. DIABETES PILLS: "Do you take any pills for your diabetes?"    8. OTHER SYMPTOMS: "Do you have any symptoms?" (e.g., fever, frequent urination, difficulty breathing, vomiting)     Frequent urination.  Golden color 9. LOW BLOOD GLUCOSE TREATMENT: "What have you done so far to treat the low blood glucose level?"     10. FOOD: "When did you last eat or drink?"       Last night 11. ALONE: "Are you alone right now or is someone with you?"        *No Answer* 12. PREGNANCY: "Is there any chance you are pregnant?" "When was your last menstrual period?"       na  Protocols used: DIABETES - LOW BLOOD SUGAR-A-AH

## 2018-05-29 ENCOUNTER — Other Ambulatory Visit: Payer: Self-pay

## 2018-05-29 ENCOUNTER — Encounter (HOSPITAL_COMMUNITY): Payer: Self-pay

## 2018-05-29 ENCOUNTER — Ambulatory Visit (INDEPENDENT_AMBULATORY_CARE_PROVIDER_SITE_OTHER): Payer: Managed Care, Other (non HMO) | Admitting: Family Medicine

## 2018-05-29 ENCOUNTER — Inpatient Hospital Stay (HOSPITAL_COMMUNITY)
Admission: EM | Admit: 2018-05-29 | Discharge: 2018-06-01 | DRG: 434 | Disposition: A | Discharge status: Home / Self Care | Payer: Managed Care, Other (non HMO) | Attending: Internal Medicine | Admitting: Internal Medicine

## 2018-05-29 ENCOUNTER — Inpatient Hospital Stay (HOSPITAL_COMMUNITY): Payer: Managed Care, Other (non HMO)

## 2018-05-29 ENCOUNTER — Encounter: Payer: Self-pay | Admitting: Family Medicine

## 2018-05-29 ENCOUNTER — Emergency Department (HOSPITAL_COMMUNITY): Payer: Managed Care, Other (non HMO)

## 2018-05-29 VITALS — BP 134/78 | HR 95 | Temp 98.0°F | Ht 66.0 in | Wt 202.0 lb

## 2018-05-29 DIAGNOSIS — R1013 Epigastric pain: Secondary | ICD-10-CM

## 2018-05-29 DIAGNOSIS — J101 Influenza due to other identified influenza virus with other respiratory manifestations: Secondary | ICD-10-CM | POA: Diagnosis present

## 2018-05-29 DIAGNOSIS — K810 Acute cholecystitis: Secondary | ICD-10-CM | POA: Diagnosis present

## 2018-05-29 DIAGNOSIS — K760 Fatty (change of) liver, not elsewhere classified: Secondary | ICD-10-CM | POA: Diagnosis present

## 2018-05-29 DIAGNOSIS — R7401 Elevation of levels of liver transaminase levels: Secondary | ICD-10-CM

## 2018-05-29 DIAGNOSIS — Z7984 Long term (current) use of oral hypoglycemic drugs: Secondary | ICD-10-CM | POA: Diagnosis not present

## 2018-05-29 DIAGNOSIS — I10 Essential (primary) hypertension: Secondary | ICD-10-CM

## 2018-05-29 DIAGNOSIS — Z720 Tobacco use: Secondary | ICD-10-CM | POA: Diagnosis present

## 2018-05-29 DIAGNOSIS — F17210 Nicotine dependence, cigarettes, uncomplicated: Secondary | ICD-10-CM | POA: Diagnosis present

## 2018-05-29 DIAGNOSIS — E1165 Type 2 diabetes mellitus with hyperglycemia: Secondary | ICD-10-CM | POA: Diagnosis present

## 2018-05-29 DIAGNOSIS — Z7289 Other problems related to lifestyle: Secondary | ICD-10-CM

## 2018-05-29 DIAGNOSIS — Z79899 Other long term (current) drug therapy: Secondary | ICD-10-CM | POA: Diagnosis not present

## 2018-05-29 DIAGNOSIS — E6609 Other obesity due to excess calories: Secondary | ICD-10-CM

## 2018-05-29 DIAGNOSIS — E663 Overweight: Secondary | ICD-10-CM | POA: Diagnosis present

## 2018-05-29 DIAGNOSIS — F1010 Alcohol abuse, uncomplicated: Secondary | ICD-10-CM | POA: Diagnosis present

## 2018-05-29 DIAGNOSIS — E1169 Type 2 diabetes mellitus with other specified complication: Secondary | ICD-10-CM | POA: Diagnosis present

## 2018-05-29 DIAGNOSIS — K709 Alcoholic liver disease, unspecified: Secondary | ICD-10-CM

## 2018-05-29 DIAGNOSIS — R531 Weakness: Secondary | ICD-10-CM | POA: Diagnosis present

## 2018-05-29 DIAGNOSIS — E7800 Pure hypercholesterolemia, unspecified: Secondary | ICD-10-CM | POA: Diagnosis not present

## 2018-05-29 DIAGNOSIS — K7010 Alcoholic hepatitis without ascites: Secondary | ICD-10-CM | POA: Diagnosis not present

## 2018-05-29 DIAGNOSIS — Z6832 Body mass index (BMI) 32.0-32.9, adult: Secondary | ICD-10-CM

## 2018-05-29 DIAGNOSIS — Z23 Encounter for immunization: Secondary | ICD-10-CM

## 2018-05-29 DIAGNOSIS — R5383 Other fatigue: Secondary | ICD-10-CM | POA: Insufficient documentation

## 2018-05-29 DIAGNOSIS — F1090 Alcohol use, unspecified, uncomplicated: Secondary | ICD-10-CM

## 2018-05-29 DIAGNOSIS — K7030 Alcoholic cirrhosis of liver without ascites: Principal | ICD-10-CM | POA: Diagnosis present

## 2018-05-29 DIAGNOSIS — R1011 Right upper quadrant pain: Secondary | ICD-10-CM | POA: Diagnosis not present

## 2018-05-29 DIAGNOSIS — E785 Hyperlipidemia, unspecified: Secondary | ICD-10-CM | POA: Diagnosis present

## 2018-05-29 DIAGNOSIS — R17 Unspecified jaundice: Secondary | ICD-10-CM

## 2018-05-29 DIAGNOSIS — E876 Hypokalemia: Secondary | ICD-10-CM | POA: Diagnosis present

## 2018-05-29 DIAGNOSIS — IMO0002 Reserved for concepts with insufficient information to code with codable children: Secondary | ICD-10-CM | POA: Diagnosis present

## 2018-05-29 DIAGNOSIS — Z789 Other specified health status: Secondary | ICD-10-CM

## 2018-05-29 DIAGNOSIS — R5382 Chronic fatigue, unspecified: Secondary | ICD-10-CM | POA: Diagnosis not present

## 2018-05-29 DIAGNOSIS — D72829 Elevated white blood cell count, unspecified: Secondary | ICD-10-CM

## 2018-05-29 DIAGNOSIS — Z5320 Procedure and treatment not carried out because of patient's decision for unspecified reasons: Secondary | ICD-10-CM | POA: Diagnosis present

## 2018-05-29 DIAGNOSIS — R740 Nonspecific elevation of levels of transaminase and lactic acid dehydrogenase [LDH]: Secondary | ICD-10-CM

## 2018-05-29 DIAGNOSIS — R600 Localized edema: Secondary | ICD-10-CM

## 2018-05-29 DIAGNOSIS — E669 Obesity, unspecified: Secondary | ICD-10-CM | POA: Diagnosis present

## 2018-05-29 DIAGNOSIS — F17200 Nicotine dependence, unspecified, uncomplicated: Secondary | ICD-10-CM

## 2018-05-29 DIAGNOSIS — R7989 Other specified abnormal findings of blood chemistry: Secondary | ICD-10-CM

## 2018-05-29 DIAGNOSIS — Z20828 Contact with and (suspected) exposure to other viral communicable diseases: Secondary | ICD-10-CM

## 2018-05-29 DIAGNOSIS — R945 Abnormal results of liver function studies: Secondary | ICD-10-CM

## 2018-05-29 DIAGNOSIS — Z8719 Personal history of other diseases of the digestive system: Secondary | ICD-10-CM

## 2018-05-29 DIAGNOSIS — E119 Type 2 diabetes mellitus without complications: Secondary | ICD-10-CM | POA: Diagnosis present

## 2018-05-29 DIAGNOSIS — Z87891 Personal history of nicotine dependence: Secondary | ICD-10-CM | POA: Diagnosis present

## 2018-05-29 LAB — LIPID PANEL
CHOL/HDL RATIO: 40
Cholesterol: 574 mg/dL — ABNORMAL HIGH (ref 0–200)
HDL: 14.2 mg/dL — ABNORMAL LOW (ref >39.00)
LDL CALC: 522 mg/dL — AB (ref 0–99)
NonHDL: 560.08
Triglycerides: 190 mg/dL — ABNORMAL HIGH (ref 0.0–149.0)
VLDL: 38 mg/dL (ref 0.0–40.0)

## 2018-05-29 LAB — CBC WITH DIFFERENTIAL/PLATELET
Basophils Relative: 0.9 % (ref 0.0–3.0)
Eosinophils Relative: 1.5 % (ref 0.0–5.0)
HCT: 37.7 % — ABNORMAL LOW (ref 39.0–52.0)
HEMOGLOBIN: 12.5 g/dL — AB (ref 13.0–17.0)
Lymphocytes Relative: 7.2 % — ABNORMAL LOW (ref 12.0–46.0)
MCHC: 33.2 g/dL (ref 30.0–36.0)
MCV: 111.5 fl — ABNORMAL HIGH (ref 78.0–100.0)
Monocytes Relative: 8.8 % (ref 3.0–12.0)
Neutrophils Relative %: 81.6 % — ABNORMAL HIGH (ref 43.0–77.0)
Platelets: 313 10*3/uL (ref 150.0–400.0)
RBC: 3.38 Mil/uL — ABNORMAL LOW (ref 4.22–5.81)
RDW: 15 % (ref 11.5–15.5)
WBC: 15.2 10*3/uL — ABNORMAL HIGH (ref 4.0–10.5)

## 2018-05-29 LAB — HEMOGLOBIN A1C: Hgb A1c MFr Bld: 10 % — ABNORMAL HIGH (ref 4.6–6.5)

## 2018-05-29 LAB — BASIC METABOLIC PANEL
BUN: 14 mg/dL (ref 6–23)
CO2: 32 mEq/L (ref 19–32)
Calcium: 9.4 mg/dL (ref 8.4–10.5)
Chloride: 91 mEq/L — ABNORMAL LOW (ref 96–112)
Creatinine, Ser: 0.81 mg/dL (ref 0.40–1.50)
GFR: 125.54 mL/min (ref >60.00)
GLUCOSE: 317 mg/dL — AB (ref 70–99)
POTASSIUM: 3.6 meq/L (ref 3.5–5.1)
Sodium: 135 mEq/L (ref 135–145)

## 2018-05-29 LAB — CBC
HCT: 35.3 % — ABNORMAL LOW (ref 39.0–52.0)
HEMOGLOBIN: 11.6 g/dL — AB (ref 13.0–17.0)
MCH: 36.3 pg — ABNORMAL HIGH (ref 26.0–34.0)
MCHC: 32.9 g/dL (ref 30.0–36.0)
MCV: 110.3 fL — ABNORMAL HIGH (ref 80.0–100.0)
Platelets: 312 10*3/uL (ref 150–400)
RBC: 3.2 MIL/uL — AB (ref 4.22–5.81)
RDW: 15.3 % (ref 11.5–15.5)
WBC: 19.9 10*3/uL — ABNORMAL HIGH (ref 4.0–10.5)
nRBC: 0.2 % (ref 0.0–0.2)

## 2018-05-29 LAB — CREATININE, SERUM
Creatinine, Ser: 0.99 mg/dL (ref 0.61–1.24)
GFR calc Af Amer: >60 mL/min (ref >60)
GFR calc non Af Amer: >60 mL/min (ref >60)

## 2018-05-29 LAB — HEPATIC FUNCTION PANEL
ALT: 69 U/L — ABNORMAL HIGH (ref 0–53)
AST: 100 U/L — ABNORMAL HIGH (ref 0–37)
Albumin: 2.9 g/dL — ABNORMAL LOW (ref 3.5–5.2)
Alkaline Phosphatase: 946 U/L — ABNORMAL HIGH (ref 39–117)
Bilirubin, Direct: 7 mg/dL — ABNORMAL HIGH (ref 0.0–0.3)
Total Bilirubin: 11.3 mg/dL — ABNORMAL HIGH (ref 0.2–1.2)
Total Protein: 6.5 g/dL (ref 6.0–8.3)

## 2018-05-29 LAB — GLUCOSE, CAPILLARY: Glucose-Capillary: 229 mg/dL — ABNORMAL HIGH (ref 70–99)

## 2018-05-29 LAB — LIPASE, BLOOD: Lipase: 27 U/L (ref 11–51)

## 2018-05-29 LAB — TSH: TSH: 3.49 u[IU]/mL (ref 0.35–4.50)

## 2018-05-29 MED ORDER — BLISTEX MEDICATED EX OINT
1.0000 "application " | TOPICAL_OINTMENT | CUTANEOUS | Status: DC | PRN
  Filled 2018-05-29: qty 6.3

## 2018-05-29 MED ORDER — THIAMINE HCL 100 MG/ML IJ SOLN
100.0000 mg | Freq: Every day | INTRAMUSCULAR | Status: DC
  Filled 2018-05-29: qty 2

## 2018-05-29 MED ORDER — ACETAMINOPHEN 325 MG PO TABS: 650.0000 mg | ORAL_TABLET | Freq: Four times a day (QID) | ORAL | Status: DC | PRN

## 2018-05-29 MED ORDER — DEXTROSE-NACL 5-0.45 % IV SOLN
INTRAVENOUS | Status: DC
  Administered 2018-05-29 – 2018-05-31 (×3): via INTRAVENOUS

## 2018-05-29 MED ORDER — POLYETHYLENE GLYCOL 3350 17 G PO PACK: 17.0000 g | PACK | Freq: Every day | ORAL | Status: DC | PRN

## 2018-05-29 MED ORDER — ONDANSETRON HCL 4 MG PO TABS: 4.0000 mg | ORAL_TABLET | Freq: Four times a day (QID) | ORAL | Status: DC | PRN

## 2018-05-29 MED ORDER — FOLIC ACID 1 MG PO TABS
1.0000 mg | ORAL_TABLET | Freq: Every day | ORAL | Status: DC
  Administered 2018-05-31 – 2018-06-01 (×3): 1 mg via ORAL
  Filled 2018-05-29 (×2): qty 1

## 2018-05-29 MED ORDER — HYDROCORTISONE 1 % EX CREA
1.0000 "application " | TOPICAL_CREAM | Freq: Three times a day (TID) | CUTANEOUS | Status: DC | PRN
  Filled 2018-05-29: qty 28

## 2018-05-29 MED ORDER — VITAMIN B-1 100 MG PO TABS
100.0000 mg | ORAL_TABLET | Freq: Every day | ORAL | Status: DC
  Administered 2018-05-31 – 2018-06-01 (×3): 100 mg via ORAL
  Filled 2018-05-29 (×2): qty 1

## 2018-05-29 MED ORDER — OSELTAMIVIR PHOSPHATE 75 MG PO CAPS: 75.0000 mg | ORAL_CAPSULE | Freq: Every day | ORAL | 0 refills | Status: DC

## 2018-05-29 MED ORDER — PIPERACILLIN-TAZOBACTAM 3.375 G IVPB
3.3750 g | Freq: Three times a day (TID) | INTRAVENOUS | Status: DC
  Administered 2018-05-30 (×2): 3.375 g via INTRAVENOUS
  Filled 2018-05-29 (×2): qty 50

## 2018-05-29 MED ORDER — HYDROCHLOROTHIAZIDE 25 MG PO TABS
25.0000 mg | ORAL_TABLET | Freq: Every day | ORAL | Status: DC
  Administered 2018-05-31 – 2018-06-01 (×3): 25 mg via ORAL
  Filled 2018-05-29 (×2): qty 1

## 2018-05-29 MED ORDER — MORPHINE SULFATE (PF) 2 MG/ML IV SOLN: 2.0000 mg | INTRAVENOUS | Status: DC | PRN

## 2018-05-29 MED ORDER — HYDROCODONE-ACETAMINOPHEN 5-325 MG PO TABS: 1.0000 | ORAL_TABLET | ORAL | Status: DC | PRN

## 2018-05-29 MED ORDER — POLYVINYL ALCOHOL 1.4 % OP SOLN
1.0000 [drp] | OPHTHALMIC | Status: DC | PRN
  Filled 2018-05-29: qty 15

## 2018-05-29 MED ORDER — ALUM & MAG HYDROXIDE-SIMETH 200-200-20 MG/5ML PO SUSP: 30.0000 mL | ORAL | Status: DC | PRN

## 2018-05-29 MED ORDER — SENNOSIDES-DOCUSATE SODIUM 8.6-50 MG PO TABS: 2.0000 | ORAL_TABLET | Freq: Every evening | ORAL | Status: DC | PRN

## 2018-05-29 MED ORDER — INSULIN ASPART 100 UNIT/ML ~~LOC~~ SOLN
0.0000 [IU] | Freq: Three times a day (TID) | SUBCUTANEOUS | Status: DC
  Administered 2018-05-30: 6 [IU] via SUBCUTANEOUS
  Administered 2018-06-01: 1 [IU] via SUBCUTANEOUS

## 2018-05-29 MED ORDER — METFORMIN HCL 500 MG PO TABS: ORAL_TABLET | ORAL | 3 refills | Status: DC

## 2018-05-29 MED ORDER — LORAZEPAM 2 MG/ML IJ SOLN: 1.0000 mg | Freq: Four times a day (QID) | INTRAMUSCULAR | Status: DC | PRN

## 2018-05-29 MED ORDER — SIMVASTATIN 20 MG PO TABS: 20.0000 mg | ORAL_TABLET | Freq: Every day | ORAL | Status: DC

## 2018-05-29 MED ORDER — MUSCLE RUB 10-15 % EX CREA
1.0000 "application " | TOPICAL_CREAM | CUTANEOUS | Status: DC | PRN
  Filled 2018-05-29: qty 85

## 2018-05-29 MED ORDER — LISINOPRIL 20 MG PO TABS
20.0000 mg | ORAL_TABLET | Freq: Every day | ORAL | Status: DC
  Administered 2018-05-31 – 2018-06-01 (×3): 20 mg via ORAL
  Filled 2018-05-29 (×2): qty 1

## 2018-05-29 MED ORDER — LORAZEPAM 1 MG PO TABS: 1.0000 mg | ORAL_TABLET | Freq: Four times a day (QID) | ORAL | Status: DC | PRN

## 2018-05-29 MED ORDER — IPRATROPIUM-ALBUTEROL 0.5-2.5 (3) MG/3ML IN SOLN: 3.0000 mL | RESPIRATORY_TRACT | Status: DC | PRN

## 2018-05-29 MED ORDER — GLIPIZIDE ER 10 MG PO TB24: ORAL_TABLET | ORAL | 3 refills | Status: DC

## 2018-05-29 MED ORDER — LISINOPRIL-HYDROCHLOROTHIAZIDE 20-25 MG PO TABS: 1.0000 | ORAL_TABLET | Freq: Every day | ORAL | Status: DC

## 2018-05-29 MED ORDER — LISINOPRIL-HYDROCHLOROTHIAZIDE 20-25 MG PO TABS: 1.0000 | ORAL_TABLET | Freq: Every day | ORAL | 3 refills | Status: DC

## 2018-05-29 MED ORDER — ADULT MULTIVITAMIN W/MINERALS CH
1.0000 | ORAL_TABLET | Freq: Every day | ORAL | Status: DC
  Administered 2018-05-31 – 2018-06-01 (×3): 1 via ORAL
  Filled 2018-05-29 (×2): qty 1

## 2018-05-29 MED ORDER — GUAIFENESIN-DM 100-10 MG/5ML PO SYRP
5.0000 mL | ORAL_SOLUTION | ORAL | Status: DC | PRN
  Administered 2018-05-29 – 2018-05-31 (×6): 5 mL via ORAL
  Filled 2018-05-29 (×6): qty 5

## 2018-05-29 MED ORDER — HYDROCORTISONE 2.5 % RE CREA
1.0000 "application " | TOPICAL_CREAM | Freq: Four times a day (QID) | RECTAL | Status: DC | PRN
  Filled 2018-05-29: qty 28.35

## 2018-05-29 MED ORDER — PHENOL 1.4 % MT LIQD: 1.0000 | OROMUCOSAL | Status: DC | PRN

## 2018-05-29 MED ORDER — SIMVASTATIN 20 MG PO TABS: ORAL_TABLET | ORAL | 3 refills | Status: DC

## 2018-05-29 MED ORDER — INSULIN GLARGINE 100 UNIT/ML ~~LOC~~ SOLN
10.0000 [IU] | Freq: Every day | SUBCUTANEOUS | Status: DC
  Administered 2018-05-29 – 2018-05-31 (×3): 10 [IU] via SUBCUTANEOUS
  Filled 2018-05-29 (×5): qty 0.1

## 2018-05-29 MED ORDER — SALINE SPRAY 0.65 % NA SOLN
1.0000 | NASAL | Status: DC | PRN
  Filled 2018-05-29: qty 44

## 2018-05-29 MED ORDER — ONDANSETRON HCL 4 MG/2ML IJ SOLN: 4.0000 mg | Freq: Four times a day (QID) | INTRAMUSCULAR | Status: DC | PRN

## 2018-05-29 MED ORDER — ACETAMINOPHEN 650 MG RE SUPP: 650.0000 mg | Freq: Four times a day (QID) | RECTAL | Status: DC | PRN

## 2018-05-29 MED ORDER — BISACODYL 5 MG PO TBEC: 5.0000 mg | DELAYED_RELEASE_TABLET | Freq: Every day | ORAL | Status: DC | PRN

## 2018-05-29 MED ORDER — SENNOSIDES-DOCUSATE SODIUM 8.6-50 MG PO TABS: 1.0000 | ORAL_TABLET | Freq: Every evening | ORAL | Status: DC | PRN

## 2018-05-29 MED ORDER — LORATADINE 10 MG PO TABS: 10.0000 mg | ORAL_TABLET | Freq: Every day | ORAL | Status: DC | PRN

## 2018-05-29 MED ORDER — PIPERACILLIN-TAZOBACTAM 3.375 G IVPB 30 MIN
3.3750 g | Freq: Once | INTRAVENOUS | Status: AC
  Administered 2018-05-29: 3.375 g via INTRAVENOUS
  Filled 2018-05-29 (×2): qty 50

## 2018-05-29 MED ORDER — HEPARIN SODIUM (PORCINE) 5000 UNIT/ML IJ SOLN
5000.0000 [IU] | Freq: Three times a day (TID) | INTRAMUSCULAR | Status: DC
  Administered 2018-05-29 – 2018-06-01 (×8): 5000 [IU] via SUBCUTANEOUS
  Filled 2018-05-29 (×8): qty 1

## 2018-05-29 MED ORDER — INSULIN ASPART 100 UNIT/ML ~~LOC~~ SOLN
0.0000 [IU] | Freq: Every day | SUBCUTANEOUS | Status: DC
  Administered 2018-05-29: 2 [IU] via SUBCUTANEOUS

## 2018-05-29 NOTE — ED Provider Notes (Signed)
[facility]ENC[facility] DEPARTMENT Provider Note   CSN: [id number] Arrival date & time: 05/29/18  1532     History   Chief Complaint Chief Complaint  Patient presents with  . Weakness    HPI Zachary Lewis is a 58 y.o. male.  HPI  58 y.o. male with medical history significant of uncontrolled diabetes mellitus type 2, active tobacco use, hyperlipidemia came to the hospital for evaluation of abnormal LFTs.  Zachary Lewis was sent by his primary care provider.  Patient tells me Zachary Lewis has had jaundice skin in his eyes since last Saturday during this time Zachary Lewis has had feeling of nausea and multiple episodes of nonbloody nonbilious vomiting.  During this time Zachary Lewis is also had poor appetite.  Denies any fevers, chills.  Denies any unintentional weight loss.  Only recent sick contact is his wife who had flu recently.  Denies having previous gallbladder or pancreas issues. Says Zachary Lewis drank up to a couple drinks per day around the holidays but none since and typically only occasional drinker.   Past Medical History:  Diagnosis Date  . Diabetes mellitus    Type II  . Hyperlipidemia   . Hypertension   . Obesity   . Personal history of colonic adenomas 03/09/2013  . Tinea pedis    Chronic    Patient Active Problem List   Diagnosis Date Noted  . Fatigue 05/29/2018  . Pedal edema 05/29/2018  . Scleral icterus 05/29/2018  . Right upper quadrant abdominal pain 05/29/2018  . Exposure to the flu 05/29/2018  . Smoking 01/04/2014  . Personal history of colonic adenomas 03/09/2013  . Prostate cancer screening 12/07/2012  . Routine general medical examination at a health care facility 08/23/2010  . HYPERTENSION, BENIGN ESSENTIAL 01/05/2010  . BACK PAIN, LUMBAR, WITH RADICULOPATHY 09/13/2008  . Obesity 09/08/2007  . TINEA PEDIS 07/16/2007  . Diabetes type 2, uncontrolled ([place]) 08/13/2006  . Hyperlipidemia 08/13/2006  . ALLERGY 08/13/2006  . Fatty liver 08/13/2006  . TOBACCO USE, QUIT  08/13/2006    Past Surgical History:  Procedure Laterality Date  . myringectomy  06/2005  . UMBILICAL HERNIA REPAIR  01/1997  . VASECTOMY  07/2002        Home Medications    Prior to Admission medications   Medication Sig Start Date End Date Taking? Authorizing Provider  Blood Glucose Monitoring Suppl (BLOOD GLUCOSE METER) kit To check glucose once daily and as needed for diabetes 10/30/11   Tower, Roque Lias A, MD  glipiZIDE (GLUCOTROL XL) 10 MG 24 hr tablet TAKE 1 TABLET(10 MG) BY MOUTH DAILY 05/29/18   Tower, Wynelle Fanny, MD  Lancets MISC To check glucose once daily and as needed for diabetes 10/30/11   Tower, Wynelle Fanny, MD  lisinopril-hydrochlorothiazide (PRINZIDE,ZESTORETIC) 20-25 MG tablet Take 1 tablet by mouth daily. 05/29/18   Tower, Wynelle Fanny, MD  metFORMIN (GLUCOPHAGE) 500 MG tablet TAKE 1 TABLET(500 MG) BY MOUTH TWICE DAILY WITH A MEAL 05/29/18   Tower, Wynelle Fanny, MD  oseltamivir (TAMIFLU) 75 MG capsule Take 1 capsule (75 mg total) by mouth daily. 05/29/18   Tower, Wynelle Fanny, MD  simvastatin (ZOCOR) 20 MG tablet TAKE 1 TABLET(20 MG) BY MOUTH AT BEDTIME 05/29/18   Tower, Wynelle Fanny, MD    Family History Family History  Problem Relation Age of Onset  . Diabetes Neg Hx   . Heart disease Neg Hx   . Colon cancer Neg Hx     Social History Social History   Tobacco Use  .  Smoking status: Current Every Day Smoker    Packs/day: 0.25  . Smokeless tobacco: Never Used  Substance Use Topics  . Alcohol use: Yes    Alcohol/week: 0.0 standard drinks    Comment: occassionally  . Drug use: No     Allergies   Metformin and related   Review of Systems Review of Systems  All systems reviewed and negative, other than as noted in HPI.  Physical Exam Updated Vital Signs BP 114/83 (BP Location: Right Arm)   Pulse 60   Temp 98.4 F (36.9 C) (Oral)   Resp 16   SpO2 96%   Physical Exam Vitals signs and nursing note reviewed.  Constitutional:      General: Zachary Lewis is not in acute distress.     Appearance: Zachary Lewis is well-developed.  HENT:     Head: Normocephalic and atraumatic.  Eyes:     General:        Right eye: No discharge.        Left eye: No discharge.     Conjunctiva/sclera: Conjunctivae normal.  Neck:     Musculoskeletal: Neck supple.  Cardiovascular:     Rate and Rhythm: Normal rate and regular rhythm.     Heart sounds: Normal heart sounds. No murmur. No friction rub. No gallop.   Pulmonary:     Effort: Pulmonary effort is normal. No respiratory distress.     Breath sounds: Normal breath sounds.  Abdominal:     Palpations: Abdomen is soft. There is no mass.     Tenderness: There is abdominal tenderness.     Comments: TTP epigastrium and in RUQ w/o rebound or guarding  Musculoskeletal:        General: No tenderness.  Skin:    General: Skin is warm and dry.  Neurological:     Mental Status: Zachary Lewis is alert.  Psychiatric:        Behavior: Behavior normal.        Thought Content: Thought content normal.      ED Treatments / Results  Labs (all labs ordered are listed, but only abnormal results are displayed) Labs Reviewed  CBC - Abnormal; Notable for the following components:      Result Value   WBC 19.9 (*)    RBC 3.20 (*)    Hemoglobin 11.6 (*)    HCT 35.3 (*)    MCV 110.3 (*)    MCH 36.3 (*)    All other components within normal limits  COMPREHENSIVE METABOLIC PANEL - Abnormal; Notable for the following components:   Sodium 134 (*)    Potassium 2.5 (*)    Chloride 93 (*)    Glucose, Bld 205 (*)    Creatinine, Ser 1.31 (*)    Calcium 8.3 (*)    Total Protein 6.2 (*)    Albumin 2.0 (*)    AST 121 (*)    ALT 66 (*)    Alkaline Phosphatase 714 (*)    Total Bilirubin 10.7 (*)    GFR calc non Af Amer 60 (*)    All other components within normal limits  CBC - Abnormal; Notable for the following components:   WBC 25.9 (*)    RBC 3.15 (*)    Hemoglobin 11.2 (*)    HCT 34.6 (*)    MCV 109.8 (*)    MCH 35.6 (*)    RDW 15.6 (*)    All other  components within normal limits  GLUCOSE, CAPILLARY - Abnormal; Notable for the following  components:   Glucose-Capillary 229 (*)    All other components within normal limits  GLUCOSE, CAPILLARY - Abnormal; Notable for the following components:   Glucose-Capillary 283 (*)    All other components within normal limits  GLUCOSE, CAPILLARY - Abnormal; Notable for the following components:   Glucose-Capillary 119 (*)    All other components within normal limits  INFLUENZA PANEL BY PCR (TYPE A & B) - Abnormal; Notable for the following components:   Influenza A By PCR POSITIVE (*)    All other components within normal limits  GLUCOSE, CAPILLARY - Abnormal; Notable for the following components:   Glucose-Capillary 143 (*)    All other components within normal limits  GLUCOSE, CAPILLARY - Abnormal; Notable for the following components:   Glucose-Capillary 122 (*)    All other components within normal limits  COMPREHENSIVE METABOLIC PANEL - Abnormal; Notable for the following components:   Sodium 132 (*)    Potassium 3.1 (*)    Chloride 94 (*)    Glucose, Bld 123 (*)    Calcium 8.0 (*)    Total Protein 5.8 (*)    Albumin 1.8 (*)    AST 141 (*)    ALT 55 (*)    Alkaline Phosphatase 658 (*)    Total Bilirubin 8.6 (*)    All other components within normal limits  CBC WITH DIFFERENTIAL/PLATELET - Abnormal; Notable for the following components:   WBC 19.2 (*)    RBC 2.99 (*)    Hemoglobin 10.7 (*)    HCT 33.3 (*)    MCV 111.4 (*)    MCH 35.8 (*)    Neutro Abs 16.7 (*)    Eosinophils Absolute 0.8 (*)    All other components within normal limits  GLUCOSE, CAPILLARY - Abnormal; Notable for the following components:   Glucose-Capillary 113 (*)    All other components within normal limits  GLUCOSE, CAPILLARY - Abnormal; Notable for the following components:   Glucose-Capillary 120 (*)    All other components within normal limits  GLUCOSE, CAPILLARY - Abnormal; Notable for the following  components:   Glucose-Capillary 136 (*)    All other components within normal limits  ALPHA-1-ANTITRYPSIN - Abnormal; Notable for the following components:   A-1 Antitrypsin, Ser 198 (*)    All other components within normal limits  IRON AND TIBC - Abnormal; Notable for the following components:   TIBC 178 (*)    Saturation Ratios 43 (*)    All other components within normal limits  FERRITIN - Abnormal; Notable for the following components:   Ferritin 1,432 (*)    All other components within normal limits  LIPASE, BLOOD  HIV ANTIBODY (ROUTINE TESTING W REFLEX)  CREATININE, SERUM  PROTIME-INR  APTT  MAGNESIUM  GLUCOSE, CAPILLARY  MAGNESIUM  HCV RNA QUANT  IGG  ANA W/REFLEX IF POSITIVE  ANTI-SMOOTH MUSCLE ANTIBODY, IGG    EKG None  Radiology No results found.   Mr Abdomen Mrcp Wo Contrast  Result Date: 05/29/2018 CLINICAL DATA:  Abnormal liver function tests.  Transaminitis. EXAM: MRI ABDOMEN WITHOUT CONTRAST TECHNIQUE: Multiplanar multisequence MR imaging was performed without the administration of intravenous contrast. COMPARISON:  None. FINDINGS: Exam was suboptimal as patient had coughing and erratic breathing throughout the exam, and could not lay still. Several attempts were made, but patient was unable to tolerate all imaging sequences being performed. Several sequences which were obtained show the following: Lower chest: No acute findings. Hepatobiliary: Hepatic cirrhosis is demonstrated. No liver  masses are identified on this unenhanced exam. The gallbladder shows diffuse wall thickening but is not distended, and this is likely secondary to hepatic cirrhosis. No evidence of biliary ductal dilatation. Pancreas: No mass or inflammatory process visualized on this unenhanced exam. Spleen:  Within normal limits in size. Adrenals/Urinary tract: Unremarkable. No evidence of hydronephrosis. Stomach/Bowel: Visualized portion unremarkable. Vascular/Lymphatic: No pathologically  enlarged lymph nodes identified. No evidence of abdominal aortic aneurysm. Other:  Minimal perihepatic ascites. Musculoskeletal:  No suspicious bone lesions identified. IMPRESSION: Technically suboptimal exam as described above. Hepatic cirrhosis and minimal ascites. Small gallbladder with diffuse wall thickening, likely secondary to cirrhosis. No evidence of biliary ductal dilatation. Electronically Signed   By: Earle Gell M.D.   On: 05/29/2018 21:02   Nm Hepato W/eject Fract  Result Date: 05/30/2018 CLINICAL DATA:  Inpatient.  Right upper quadrant abdominal pain. EXAM: NUCLEAR MEDICINE HEPATOBILIARY IMAGING WITH GALLBLADDER EF TECHNIQUE: Sequential images of the abdomen were obtained out to 60 minutes following intravenous administration of radiopharmaceutical. After oral ingestion of Ensure, gallbladder ejection fraction was determined. At 60 min, normal ejection fraction is greater than 33%. RADIOPHARMACEUTICALS:  7.5 mCi Tc-5m Choletec IV COMPARISON:  05/29/2018 MRI abdomen and abdominal sonogram. FINDINGS: Prompt uptake and biliary excretion of activity by the liver is seen. Gallbladder activity is visualized, consistent with patency of cystic duct. Biliary activity passes into small bowel, consistent with patent common bile duct. Calculated gallbladder ejection fraction is 43%. (Normal gallbladder ejection fraction with Ensure is greater than 33%.) IMPRESSION: Normal hepatobiliary scintigraphy study. Patent cystic duct, which is not compatible with acute cholecystitis. Patent common bile duct. Normal gallbladder ejection fraction. Electronically Signed   By: JIlona SorrelM.D.   On: 05/30/2018 11:47   [place]domen Limited  Result Date: 05/29/2018 CLINICAL DATA:  Abnormal liver function tests EXAM: ULTRASOUND ABDOMEN LIMITED RIGHT UPPER QUADRANT COMPARISON:  None. FINDINGS: Gallbladder: There are no gallstones. The gallbladder wall is thickened with hypoechoic signal centrally. It measures up to 6 mm.  Pericholecystic fluid is also present. Negative sonographic Murphy sign. Common bile duct: Diameter: 2 mm in caliber. Liver: The liver is diffusely increased in echogenicity with heterogeneity of the liver echotexture. There is focal hypoechoic signal adjacent to the gallbladder with a geographic border. Portal vein is patent on color Doppler imaging with normal direction of blood flow towards the liver. IMPRESSION: There is gallbladder wall thickening, gallbladder wall edema, and pericholecystic fluid. There is no Murphy sign or gallstones. These findings are nonspecific. Correlate clinically as for the need for further imaging. An inflammatory process is not excluded. The liver is diffusely heterogeneous and increased in echogenicity. Findings may represent diffuse hepatic steatosis. Diffuse hepatic parenchymal disease can have a similar appearance. Hypoechoic signal adjacent to the gallbladder is favored represent relative sparing of fatty infiltration. Electronically Signed   By: AMarybelle KillingsM.D.   On: 05/29/2018 16:24    Procedures Procedures (including critical care time)  Medications Ordered in ED Medications - No data to display   Initial Impression / Assessment and Plan / ED Course  I have reviewed the triage vital signs and the nursing notes.  Pertinent labs & imaging results that were available during my care of the patient were reviewed by me and considered in my medical decision making (see chart for details).     57yM with abnormal LFTs. Seen by PCP and noted to have upper abdominal tenderness which is consistent with my exam as well. T bili 11 and primarily  direct.  AP 900. Minimal elevations in ALT/AST.  Likely obstructive pathology. Zachary Lewis denies abdominal pain but is tender in epigastrium and RUQ. Additional labs done by PCP today significant for WBC of 15,200 and glucose of 317. Hepatitis panel sent as well. Will establish IV. Will obtain RUQ [place].   [place] as above. Acalculus  cholecystitis? Not common in non-critically ill patient without immunosuppression or other significant underlying medical illness. Zachary Lewis is afberile but does have leukocytosis. Zachary Lewis is tender in epigastrium/RUQ. Tried to get HIDA scan but unable obtain in time prior to nuc med leaving. I think it would be most prudent to admit for HIDA in the morning, particularly with it now going into the weekend. Discussed with medicine for admission. Will discuss with GI.   Final Clinical Impressions(s) / ED Diagnoses   Final diagnoses:  Abnormal LFTs  Epigastric pain    ED Discharge Orders    None       Virgel Manifold, MD 06/07/18 1443

## 2018-05-29 NOTE — H&P (Addendum)
History and Physical    Zachary Lewis QPY:[id number] DOB: [birth year] DOA: 05/29/2018  PCP: Abner Greenspan, MD Patient coming from: Home  Chief Complaint: Jaundice  HPI: Zachary Lewis is a 58 y.o. male with medical history significant of uncontrolled diabetes mellitus type 2, active tobacco use, hyperlipidemia came to the hospital for evaluation of abnormal LFTs.  He was sent by his primary care provider.  Patient tells me he has had jaundice skin in his eyes since last Saturday during this time he has had feeling of nausea and multiple episodes of nonbloody nonbilious vomiting.  During this time he is also had poor appetite.  Denies any fevers, chills.  Denies any unintentional weight loss.  Only recent sick contact is his wife who had flu recently.  Denies having previous gallbladder or pancreas issues.  He follows his primary care provider outpatient closely to get his diabetes and hyperlipidemia under control.  Admits of medication compliance. He was seen by his outpatient primary care for routine visit this morning and was noted to have elevated LFTs with ALP of greater than 900, total bilirubin 11.3, direct bilirubin 7.0.  He was sent to the ER for further evaluation.  In the ER his lipase levels - pending.  Gallbladder ultrasound showed wall thickening concerning for cholecystitis but no Murphy sign was noted.  No gallstones were seen.  There is diffuse hepatic steatosis.   Review of Systems: As per HPI otherwise 10 point review of systems negative.  Review of Systems Otherwise negative except as per HPI, including: General: Denies fever, chills, night sweats or unintended weight loss. Resp: Denies cough, wheezing, shortness of breath. Cardiac: Denies chest pain, palpitations, orthopnea, paroxysmal nocturnal dyspnea. GI: Denies , diarrhea or constipation GU: Denies dysuria, frequency, hesitancy or incontinence MS: Denies muscle aches, joint pain or swelling Neuro: Denies headache,  neurologic deficits (focal weakness, numbness, tingling), abnormal gait Psych: Denies anxiety, depression, SI/HI/AVH Skin: Denies new rashes or lesions ID: Denies sick contacts, exotic exposures, travel  Past Medical History:  Diagnosis Date  . Diabetes mellitus    Type II  . Hyperlipidemia   . Hypertension   . Obesity   . Personal history of colonic adenomas 03/09/2013  . Tinea pedis    Chronic    Past Surgical History:  Procedure Laterality Date  . myringectomy  06/2005  . UMBILICAL HERNIA REPAIR  01/1997  . VASECTOMY  07/2002    SOCIAL HISTORY:  reports that he has been smoking. He has been smoking about 0.25 packs per day. He has never used smokeless tobacco. He reports current alcohol use. He reports that he does not use drugs.  Allergies  Allergen Reactions  . Metformin And Related     diarrhea    FAMILY HISTORY: Family History  Problem Relation Age of Onset  . Diabetes Neg Hx   . Heart disease Neg Hx   . Colon cancer Neg Hx      Prior to Admission medications   Medication Sig Start Date End Date Taking? Authorizing Provider  Blood Glucose Monitoring Suppl (BLOOD GLUCOSE METER) kit To check glucose once daily and as needed for diabetes 10/30/11   Tower, Roque Lias A, MD  glipiZIDE (GLUCOTROL XL) 10 MG 24 hr tablet TAKE 1 TABLET(10 MG) BY MOUTH DAILY 05/29/18   Tower, Wynelle Fanny, MD  Lancets MISC To check glucose once daily and as needed for diabetes 10/30/11   Tower, Wynelle Fanny, MD  lisinopril-hydrochlorothiazide (PRINZIDE,ZESTORETIC) 20-25 MG tablet Take 1 tablet  by mouth daily. 05/29/18   Tower, Wynelle Fanny, MD  metFORMIN (GLUCOPHAGE) 500 MG tablet TAKE 1 TABLET(500 MG) BY MOUTH TWICE DAILY WITH A MEAL 05/29/18   Tower, Wynelle Fanny, MD  oseltamivir (TAMIFLU) 75 MG capsule Take 1 capsule (75 mg total) by mouth daily. 05/29/18   Tower, Wynelle Fanny, MD  simvastatin (ZOCOR) 20 MG tablet TAKE 1 TABLET(20 MG) BY MOUTH AT BEDTIME 05/29/18   Tower, Wynelle Fanny, MD    Physical Exam: Vitals:    05/29/18 1535 05/29/18 1555  BP: 114/83 110/73  Pulse: 60 (!) 55  Resp: 16 16  Temp: 98.4 F (36.9 C)   TempSrc: Oral   SpO2: 96% 97%      Constitutional: NAD, calm, comfortable Eyes: Positive scleral icterus ENMT: Mucous membranes are moist. Posterior pharynx clear of any exudate or lesions.Normal dentition.  Neck: normal, supple, no masses, no thyromegaly Respiratory: clear to auscultation bilaterally, no wheezing, no crackles. Normal respiratory effort. No accessory muscle use.  Cardiovascular: Regular rate and rhythm, no murmurs / rubs / gallops. No extremity edema. 2+ pedal pulses. No carotid bruits.  Abdomen: no tenderness, no masses palpated. No hepatosplenomegaly. Bowel sounds positive.  Musculoskeletal: no clubbing / cyanosis. No joint deformity upper and lower extremities. Good ROM, no contractures. Normal muscle tone.  Skin: no rashes, lesions, ulcers. No induration Neurologic: CN 2-12 grossly intact. Sensation intact, DTR normal. Strength 5/5 in all 4.  Psychiatric: Normal judgment and insight. Alert and oriented x 3. Normal mood.     Labs on Admission: I have personally reviewed following labs and imaging studies  CBC: Recent Labs  Lab 05/29/18 1030  WBC 15.2*  HGB 12.5*  HCT 37.7*  MCV 111.5*  PLT 151.7   Basic Metabolic Panel: Recent Labs  Lab 05/29/18 1030  NA 135  K 3.6  CL 91*  CO2 32  GLUCOSE 317*  BUN 14  CREATININE 0.81  CALCIUM 9.4   GFR: Estimated Creatinine Clearance: 105.3 mL/min (by C-G formula based on SCr of 0.81 mg/dL). Liver Function Tests: Recent Labs  Lab 05/29/18 1030  AST 100*  ALT 69*  ALKPHOS 946*  BILITOT 11.3*  PROT 6.5  ALBUMIN 2.9*   No results for input(s): LIPASE, AMYLASE in the last 168 hours. No results for input(s): AMMONIA in the last 168 hours. Coagulation Profile: No results for input(s): INR, PROTIME in the last 168 hours. Cardiac Enzymes: No results for input(s): CKTOTAL, CKMB, CKMBINDEX, TROPONINI  in the last 168 hours. BNP (last 3 results) No results for input(s): PROBNP in the last 8760 hours. HbA1C: Recent Labs    05/29/18 1030  HGBA1C 10.0*   CBG: No results for input(s): GLUCAP in the last 168 hours. Lipid Profile: Recent Labs    05/29/18 1030  CHOL 574*  HDL 14.20*  LDLCALC 522*  TRIG 190.0*  CHOLHDL 40   Thyroid Function Tests: Recent Labs    05/29/18 1030  TSH 3.49   Anemia Panel: No results for input(s): VITAMINB12, FOLATE, FERRITIN, TIBC, IRON, RETICCTPCT in the last 72 hours. Urine analysis: No results found for: COLORURINE, APPEARANCEUR, LABSPEC, PHURINE, GLUCOSEU, HGBUR, BILIRUBINUR, KETONESUR, PROTEINUR, UROBILINOGEN, NITRITE, LEUKOCYTESUR Sepsis Labs: !!!!!!!!!!!!!!!!!!!!!!!!!!!!!!!!!!!!!!!!!!!! _0 (procalcitonin:4,lacticidven:4) )No results found for this or any previous visit (from the past 240 hour(s)).   Radiological Exams on Admission: US Abdomen Limited  Result Date: 05/29/2018 CLINICAL DATA:  Abnormal liver function tests EXAM: ULTRASOUND ABDOMEN LIMITED RIGHT UPPER QUADRANT COMPARISON:  None. FINDINGS: Gallbladder: There are no gallstones. The gallbladder wall is thickened  with hypoechoic signal centrally. It measures up to 6 mm. Pericholecystic fluid is also present. Negative sonographic Murphy sign. Common bile duct: Diameter: 2 mm in caliber. Liver: The liver is diffusely increased in echogenicity with heterogeneity of the liver echotexture. There is focal hypoechoic signal adjacent to the gallbladder with a geographic border. Portal vein is patent on color Doppler imaging with normal direction of blood flow towards the liver. IMPRESSION: There is gallbladder wall thickening, gallbladder wall edema, and pericholecystic fluid. There is no Murphy sign or gallstones. These findings are nonspecific. Correlate clinically as for the need for further imaging. An inflammatory process is not excluded. The liver is diffusely heterogeneous and  increased in echogenicity. Findings may represent diffuse hepatic steatosis. Diffuse hepatic parenchymal disease can have a similar appearance. Hypoechoic signal adjacent to the gallbladder is favored represent relative sparing of fatty infiltration. Electronically Signed   By: Marybelle Killings M.D.   On: 05/29/2018 16:24     All images have been reviewed by me personally.    Assessment/Plan Principal Problem:   Acute cholecystitis Active Problems:   Diabetes type 2, uncontrolled ([facility])   Hyperlipidemia   Obesity   HYPERTENSION, BENIGN ESSENTIAL   Smoking   Transaminitis   Alcohol use    Acalculous acute cholecystitis Transaminitis with elevated total bilirubin - Admit the patient to the [place] floor - Gastroenterology consulted, will get MRCP. HIDA ordered  - Antiemetics, n.p.o. for now. -D5 half-normal saline, pain control with morphine -Trend LFTs.  Lipase - pending -Hemoglobin A1c elevated at 10, LDL is 422 -start Zosyn  Uncontrolled diabetes mellitus type 2 -Hemoglobin A1c 10.  Insulin sliding scale and Accu-Chek -Lantus 5 units, will need to adjust as necessary -Diabetic coordinator  Hyperlipidemia   -On Zocor 20 mg at home.  Hold off on statin for now until LFTs improve and then can restart high intensity statin if indicated  Alcohol use -Daily alcohol use, alcohol withdrawal protocol in place -Folic acid, thiamine and multivitamins ordered  Active tobacco use -Nebulizer as needed, counseled to quit using.  Nicotine patch if needed    DVT prophylaxis: Subcutaneous heparin Code Status: Full code Family Communication: Wife at bedside Disposition Plan: To be determined Consults called: Gastroenterology Admission status: Inpatient MedSurg   Time Spent: 65 minutes.  >50% of the time was devoted to discussing the patients care, assessment, plan and disposition with other care givers along with counseling the patient about the risks and benefits of treatment.     Ankit Arsenio Loader MD [facility]  If 7PM-7AM, please contact night-coverage [email or web address]  05/29/2018, 5:39 PM

## 2018-05-29 NOTE — Progress Notes (Signed)
Subjective:    Patient ID: Zachary Lewis, male    DOB: [birth year], 58 y.o.   MRN: [id number]  HPI Here for fatigue  Also wants to get a flu shot   Wt Readings from Last 3 Encounters:  05/29/18 202 lb (91.6 kg)  01/12/18 205 lb 12.8 oz (93.4 kg)  06/30/17 210 lb 4 oz (95.4 kg)  still loosing weight  Continues to eat DM diet -even during the holidays  Exercise  32.60 kg/m   Very stressful holidays (works for [employer])  All work related  Had to work 90-95 hours per week  Has thought about getting out of there  Will to on vacation next week   His job depresses him   His eyes are yellow recently  Ankles are swollen His wife noticed   Drank 2 drinks per day after work during the holidays None since   No hx of hepatitis or IV drug use   Is really wiped out !  This almost killed him this year   Having trouble getting an erection   Has a cough - started last night / no fever or chills or sweats  Wife has the flu    bp is stable today  No cp or palpitations or headaches or edema  No side effects to medicines  BP Readings from Last 3 Encounters:  05/29/18 134/78  01/12/18 (!) 176/108  06/30/17 120/78    Wakes up around 5:30 - immediately takes bp medicines     DM2 Lab Results  Component Value Date   HGBA1C 8.1 (H) 01/12/2018  does not think it will look better  Overdue for re check  Last visit he did miss 5 d of metformin and glipizide  Now still eating well but did not take his medicine  Eye exam   Eats 9:30 - 10 am  Takes glipizide at that time  Does not take metformin at this time (? Why)   He does not take his pm dose of metformin  Always has pill box with him   Hyperlipidemia Lab Results  Component Value Date   CHOL 235 (H) 01/12/2018   HDL 74.40 01/12/2018   LDLCALC 140 (H) 01/12/2018   LDLDIRECT 176.9 12/08/2012   TRIG 106.0 01/12/2018   CHOLHDL 3 01/12/2018   simvastatin at that time was missed for a week  Needs a re check    Misses dose right before bed   Patient Active Problem List   Diagnosis Date Noted  . Fatigue 05/29/2018  . Pedal edema 05/29/2018  . Scleral icterus 05/29/2018  . Right upper quadrant abdominal pain 05/29/2018  . Exposure to the flu 05/29/2018  . Acute cholecystitis 05/29/2018  . Transaminitis 05/29/2018  . Alcohol use 05/29/2018  . Smoking 01/04/2014  . Personal history of colonic adenomas 03/09/2013  . Prostate cancer screening 12/07/2012  . Routine general medical examination at a health care facility 08/23/2010  . HYPERTENSION, BENIGN ESSENTIAL 01/05/2010  . BACK PAIN, LUMBAR, WITH RADICULOPATHY 09/13/2008  . Obesity 09/08/2007  . TINEA PEDIS 07/16/2007  . Diabetes type 2, uncontrolled ([place]) 08/13/2006  . Hyperlipidemia 08/13/2006  . ALLERGY 08/13/2006  . Fatty liver 08/13/2006   Past Medical History:  Diagnosis Date  . Diabetes mellitus    Type II  . Hyperlipidemia   . Hypertension   . Obesity   . Personal history of colonic adenomas 03/09/2013  . Tinea pedis    Chronic   Past Surgical History:  Procedure  Laterality Date  . myringectomy  06/2005  . UMBILICAL HERNIA REPAIR  01/1997  . VASECTOMY  07/2002   Social History   Tobacco Use  . Smoking status: Current Every Day Smoker    Packs/day: 0.25  . Smokeless tobacco: Never Used  Substance Use Topics  . Alcohol use: Yes    Alcohol/week: 0.0 standard drinks    Comment: occassionally  . Drug use: No   Family History  Problem Relation Age of Onset  . Diabetes Neg Hx   . Heart disease Neg Hx   . Colon cancer Neg Hx    No Known Allergies No current facility-administered medications on file prior to visit.    Current Outpatient Medications on File Prior to Visit  Medication Sig Dispense Refill  . Blood Glucose Monitoring Suppl (BLOOD GLUCOSE METER) kit To check glucose once daily and as needed for diabetes 1 each 0  . Lancets MISC To check glucose once daily and as needed for diabetes 10 each 3      Smoking status - 1ppd Not ready to quit Aware of risks  Patient Active Problem List   Diagnosis Date Noted  . Fatigue 05/29/2018  . Pedal edema 05/29/2018  . Scleral icterus 05/29/2018  . Right upper quadrant abdominal pain 05/29/2018  . Exposure to the flu 05/29/2018  . Acute cholecystitis 05/29/2018  . Transaminitis 05/29/2018  . Alcohol use 05/29/2018  . Smoking 01/04/2014  . Personal history of colonic adenomas 03/09/2013  . Prostate cancer screening 12/07/2012  . Routine general medical examination at a health care facility 08/23/2010  . HYPERTENSION, BENIGN ESSENTIAL 01/05/2010  . BACK PAIN, LUMBAR, WITH RADICULOPATHY 09/13/2008  . Obesity 09/08/2007  . TINEA PEDIS 07/16/2007  . Diabetes type 2, uncontrolled ([facility]) 08/13/2006  . Hyperlipidemia 08/13/2006  . ALLERGY 08/13/2006  . Fatty liver 08/13/2006   Past Medical History:  Diagnosis Date  . Diabetes mellitus    Type II  . Hyperlipidemia   . Hypertension   . Obesity   . Personal history of colonic adenomas 03/09/2013  . Tinea pedis    Chronic   Past Surgical History:  Procedure Laterality Date  . myringectomy  06/2005  . UMBILICAL HERNIA REPAIR  01/1997  . VASECTOMY  07/2002   Social History   Tobacco Use  . Smoking status: Current Every Day Smoker    Packs/day: 0.25  . Smokeless tobacco: Never Used  Substance Use Topics  . Alcohol use: Yes    Alcohol/week: 0.0 standard drinks    Comment: occassionally  . Drug use: No   Family History  Problem Relation Age of Onset  . Diabetes Neg Hx   . Heart disease Neg Hx   . Colon cancer Neg Hx    No Known Allergies No current facility-administered medications on file prior to visit.    Current Outpatient Medications on File Prior to Visit  Medication Sig Dispense Refill  . Blood Glucose Monitoring Suppl (BLOOD GLUCOSE METER) kit To check glucose once daily and as needed for diabetes 1 each 0  . Lancets MISC To check glucose once daily and as needed  for diabetes 10 each 3     Review of Systems  Constitutional: Positive for fatigue. Negative for activity change, appetite change, fever and unexpected weight change.       Malaise  HENT: Negative for congestion, rhinorrhea, sore throat and trouble swallowing.   Eyes: Negative for photophobia, pain, discharge, redness, itching and visual disturbance.  Yellow sclera   Respiratory: Positive for cough. Negative for chest tightness, shortness of breath and wheezing.        Started a mild dry cough this am   Cardiovascular: Negative for chest pain and palpitations.  Gastrointestinal: Positive for abdominal pain. Negative for abdominal distention, anal bleeding, blood in stool, constipation, diarrhea, nausea, rectal pain and vomiting.       R sided and R flank pain recently on and off   Endocrine: Negative for cold intolerance, heat intolerance, polydipsia and polyuria.  Genitourinary: Negative for difficulty urinating, dysuria, frequency, hematuria and urgency.  Musculoskeletal: Negative for arthralgias, joint swelling and myalgias.  Skin: Positive for color change. Negative for pallor and rash.       Change in skin tone  Neurological: Negative for dizziness, tremors, weakness, numbness and headaches.  Hematological: Negative for adenopathy. Does not bruise/bleed easily.  Psychiatric/Behavioral: Negative for decreased concentration and dysphoric mood. The patient is not nervous/anxious.        Objective:   Physical Exam Constitutional:      General: He is not in acute distress.    Appearance: He is well-developed. He is obese. He is ill-appearing.     Comments: Very fatigued   HENT:     Head: Normocephalic and atraumatic.     Right Ear: Tympanic membrane and external ear normal.     Left Ear: Tympanic membrane and external ear normal.     Nose: Nose normal.     Mouth/Throat:     Mouth: Mucous membranes are moist.     Pharynx: Oropharynx is clear. No oropharyngeal exudate or  posterior oropharyngeal erythema.  Eyes:     General: Scleral icterus present.        Right eye: No discharge.        Left eye: No discharge.     Conjunctiva/sclera: Conjunctivae normal.     Pupils: Pupils are equal, round, and reactive to light.  Neck:     Musculoskeletal: Normal range of motion and neck supple. No neck rigidity.     Thyroid: No thyromegaly.     Vascular: No carotid bruit or JVD.  Cardiovascular:     Rate and Rhythm: Regular rhythm. Tachycardia present.     Pulses: Normal pulses.     Heart sounds: Normal heart sounds. No gallop.   Pulmonary:     Effort: Pulmonary effort is normal. No respiratory distress.     Breath sounds: Normal breath sounds. No wheezing or rales.  Abdominal:     General: Bowel sounds are normal. There is no distension or abdominal bruit.     Palpations: Abdomen is soft. There is no mass.     Tenderness: There is abdominal tenderness in the right upper quadrant. There is no right CVA tenderness, left CVA tenderness, guarding or rebound. Negative signs include Murphy's sign, McBurney's sign and obturator sign.  Musculoskeletal:     Right lower leg: Edema present.     Left lower leg: Edema present.     Comments: One plus pitting edema in ankles   Lymphadenopathy:     Cervical: No cervical adenopathy.  Skin:    General: Skin is warm and dry.     Capillary Refill: Capillary refill takes less than 2 seconds.     Coloration: Skin is jaundiced.     Findings: No rash.     Comments: Slight sallow complexion- different from baseline   Neurological:     Mental Status: He is alert. Mental status is at  baseline.     Cranial Nerves: No cranial nerve deficit.     Coordination: Coordination normal.     Deep Tendon Reflexes: Reflexes are normal and symmetric. Reflexes normal.  Psychiatric:        Mood and Affect: Mood normal.           Assessment & Plan:   Problem List Items Addressed This Visit      Cardiovascular and Mediastinum    HYPERTENSION, BENIGN ESSENTIAL    bp in fair control at this time  BP Readings from Last 1 Encounters:  05/30/18 128/77   No changes needed Most recent labs reviewed  Disc lifstyle change with low sodium diet and exercise        Relevant Medications   lisinopril-hydrochlorothiazide (PRINZIDE,ZESTORETIC) 20-25 MG tablet   simvastatin (ZOCOR) 20 MG tablet   Other Relevant Orders   Basic metabolic panel (Completed)   CBC with Differential/Platelet (Completed)   Lipid panel (Completed)   TSH (Completed)     Endocrine   Diabetes type 2, uncontrolled ([facility])    Lab Results  Component Value Date   HGBA1C 10.0 (H) 05/29/2018   Noncompliant with medications Extensively made plan today for timing of medication doses and use of cell phone alarm Diet/exercise enc Needs eye exam-he wants to schedule this himself       Relevant Medications   lisinopril-hydrochlorothiazide (PRINZIDE,ZESTORETIC) 20-25 MG tablet   metFORMIN (GLUCOPHAGE) 500 MG tablet   glipiZIDE (GLUCOTROL XL) 10 MG 24 hr tablet   simvastatin (ZOCOR) 20 MG tablet   Other Relevant Orders   Hemoglobin A1c (Completed)     Other   Smoking    Disc in detail risks of smoking and possible outcomes including copd, vascular/ heart disease, cancer , respiratory and sinus infections  Pt voices understanding Pt states he is not ready to quit       Scleral icterus - Primary    Worrisome New  Also fatigue and pedal edema and RUQ abd discomfort  Lab today- liver/ cbc/hepaitits profile  Enc to avoid etoh or acetaminophen  Admits to 2 drinks per day around holidays       Relevant Orders   Hepatic function panel (Completed)   Acute Hep Panel & Hep B Surface Ab   Right upper quadrant abdominal pain (Chronic)    Mild with some tenderness In setting of scleral icterus and pedal edema  Worrisome LFT and hepatitis tests now  Disc etoh - admits to 2 drinks per day during holidays and none since      Relevant Orders   Hepatic  function panel (Completed)   Acute Hep Panel & Hep B Surface Ab   Pedal edema    Worrisome in setting of new icterus and fatigue  LFTs today      Relevant Orders   Basic metabolic panel (Completed)   Hepatic function panel (Completed)   Obesity    Discussed how this problem influences overall health and the risks it imposes  Reviewed plan for weight loss with lower calorie diet (via better food choices and also portion control or program like weight watchers) and exercise building up to or more than 30 minutes 5 days per week including some aerobic activity         Relevant Medications   metFORMIN (GLUCOPHAGE) 500 MG tablet   glipiZIDE (GLUCOTROL XL) 10 MG 24 hr tablet   Hyperlipidemia    Disc goals for lipids and reasons to control them Rev last labs with  pt Rev low sat fat diet in detail LDL is going to be very high due to noncompliance with medication  Disc plan for this       Relevant Medications   lisinopril-hydrochlorothiazide (PRINZIDE,ZESTORETIC) 20-25 MG tablet   simvastatin (ZOCOR) 20 MG tablet   Other Relevant Orders   Lipid panel (Completed)   Fatigue    Assoc with scleral icterus  LFTS and hepatitis panel drawn        Relevant Orders   Basic metabolic panel (Completed)   CBC with Differential/Platelet (Completed)   Lipid panel (Completed)   TSH (Completed)   Hepatic function panel (Completed)   Exposure to the flu    tamiflu dosed for prophylaxis        Other Visit Diagnoses    Need for influenza vaccination       Relevant Orders   Flu Vaccine QUAD 6+ mos PF IM (Fluarix Quad PF) (Completed)

## 2018-05-29 NOTE — Progress Notes (Signed)
    Spoke to Dr. Wilson Singer about Mr. Riling  Acalculous cholecystitis seems very possible  Would treat as such and can get [facility] input, HIDA in AM  I will see him also.  Gatha Mayer, MD, [facility] 05/29/2018 6:26 PM Pager [phone]

## 2018-05-29 NOTE — ED Notes (Signed)
ED Provider at bedside. 

## 2018-05-29 NOTE — Progress Notes (Signed)
Pt arrived to unit at [facility] from ER via wheelchair, pt assisted to bathroom on arrival to void. Wife at bedside. Pt has dry cough, asking for cough medication. Message sent to Dr Reesa Chew.  Pt oriented to room and call bell by unit NT. Unit NT and lab staff in with pt at this time also getting vital signs and blood per orders.

## 2018-05-29 NOTE — Patient Instructions (Addendum)
Follow the following medicines schedule Set alarm on cell phone to dose   Take lisinopril hct when you wake up   Then take glipizide and metformin at 9:30-10 as usual with meal   Take 2nd metformin at 8 pm with simvastatin   Again-set alarms  Buy a different pill box with am and pm boxes   Let's schedule a physical for 3 months   Flu shot today   Take tamiflu as directed   You need a diabetic eye exam   Labs for fatigue and also for yellow eyes/ ankle swelling and liver

## 2018-05-29 NOTE — ED Notes (Signed)
Patient transported to [facility] 

## 2018-05-29 NOTE — Progress Notes (Signed)
Pharmacy Antibiotic Note  Zachary Lewis is a 58 y.o. male admitted on 05/29/2018 with intra-abdominal infection.  Pharmacy has been consulted for Zosyn dosing. WBC 15.2.   Plan: -Zosyn 3.375 gm IV Q 8 hours (EI infusion) -Monitor CBC, renal fx, cultures and clinical progress     Temp (24hrs), Avg:98.7 F (37.1 C), Min:98 F (36.7 C), Max:99.7 F (37.6 C)  Recent Labs  Lab 05/29/18 1030  WBC 15.2*  CREATININE 0.81    Estimated Creatinine Clearance: 105.3 mL/min (by C-G formula based on SCr of 0.81 mg/dL).    No Known Allergies    Thank you for allowing pharmacy to be a part of this patient's care.  Albertina Parr, PharmD., BCPS Clinical Pharmacist Clinical phone for 05/29/18 until 10:30pm: [phone] If after 10:30pm, please refer to [facility] for unit-specific pharmacist

## 2018-05-29 NOTE — ED Notes (Signed)
NM unable to perform scan today due to it taking over 2 hours and they leave at 6. EDP made aware

## 2018-05-29 NOTE — ED Triage Notes (Signed)
Pt here from [facility] MD office for elevated liver enzymes pt only complaint is weakness

## 2018-05-30 ENCOUNTER — Inpatient Hospital Stay (HOSPITAL_COMMUNITY): Payer: Managed Care, Other (non HMO)

## 2018-05-30 ENCOUNTER — Encounter (HOSPITAL_COMMUNITY): Payer: Self-pay | Admitting: Internal Medicine

## 2018-05-30 DIAGNOSIS — R17 Unspecified jaundice: Secondary | ICD-10-CM

## 2018-05-30 DIAGNOSIS — K709 Alcoholic liver disease, unspecified: Secondary | ICD-10-CM

## 2018-05-30 DIAGNOSIS — D72829 Elevated white blood cell count, unspecified: Secondary | ICD-10-CM

## 2018-05-30 LAB — PROTIME-INR
INR: 1.12
Prothrombin Time: 14.3 seconds (ref 11.4–15.2)

## 2018-05-30 LAB — COMPREHENSIVE METABOLIC PANEL
ALT: 66 U/L — ABNORMAL HIGH (ref 0–44)
AST: 121 U/L — ABNORMAL HIGH (ref 15–41)
Albumin: 2 g/dL — ABNORMAL LOW (ref 3.5–5.0)
Alkaline Phosphatase: 714 U/L — ABNORMAL HIGH (ref 38–126)
Anion gap: 13 (ref 5–15)
BUN: 20 mg/dL (ref 6–20)
CHLORIDE: 93 mmol/L — AB (ref 98–111)
CO2: 28 mmol/L (ref 22–32)
Calcium: 8.3 mg/dL — ABNORMAL LOW (ref 8.9–10.3)
Creatinine, Ser: 1.31 mg/dL — ABNORMAL HIGH (ref 0.61–1.24)
GFR calc Af Amer: >60 mL/min (ref >60)
GFR calc non Af Amer: 60 mL/min — ABNORMAL LOW (ref >60)
Glucose, Bld: 205 mg/dL — ABNORMAL HIGH (ref 70–99)
Potassium: 2.5 mmol/L — CL (ref 3.5–5.1)
Sodium: 134 mmol/L — ABNORMAL LOW (ref 135–145)
Total Bilirubin: 10.7 mg/dL — ABNORMAL HIGH (ref 0.3–1.2)
Total Protein: 6.2 g/dL — ABNORMAL LOW (ref 6.5–8.1)

## 2018-05-30 LAB — CBC
HCT: 34.6 % — ABNORMAL LOW (ref 39.0–52.0)
Hemoglobin: 11.2 g/dL — ABNORMAL LOW (ref 13.0–17.0)
MCH: 35.6 pg — AB (ref 26.0–34.0)
MCHC: 32.4 g/dL (ref 30.0–36.0)
MCV: 109.8 fL — ABNORMAL HIGH (ref 80.0–100.0)
Platelets: 296 10*3/uL (ref 150–400)
RBC: 3.15 MIL/uL — AB (ref 4.22–5.81)
RDW: 15.6 % — ABNORMAL HIGH (ref 11.5–15.5)
WBC: 25.9 10*3/uL — ABNORMAL HIGH (ref 4.0–10.5)
nRBC: 0.1 % (ref 0.0–0.2)

## 2018-05-30 LAB — MAGNESIUM: Magnesium: 1.9 mg/dL (ref 1.7–2.4)

## 2018-05-30 LAB — INFLUENZA PANEL BY PCR (TYPE A & B)
INFLBPCR: NEGATIVE
Influenza A By PCR: POSITIVE — AB

## 2018-05-30 LAB — GLUCOSE, CAPILLARY
Glucose-Capillary: 283 mg/dL — ABNORMAL HIGH (ref 70–99)
Glucose-Capillary: 119 mg/dL — ABNORMAL HIGH (ref 70–99)
Glucose-Capillary: 143 mg/dL — ABNORMAL HIGH (ref 70–99)

## 2018-05-30 LAB — APTT: aPTT: 30 seconds (ref 24–36)

## 2018-05-30 LAB — HIV ANTIBODY (ROUTINE TESTING W REFLEX): HIV Screen 4th Generation wRfx: NONREACTIVE

## 2018-05-30 MED ORDER — POTASSIUM CHLORIDE 10 MEQ/100ML IV SOLN
10.0000 meq | Freq: Once | INTRAVENOUS | Status: AC
  Administered 2018-05-30: 10 meq via INTRAVENOUS
  Filled 2018-05-30: qty 100

## 2018-05-30 MED ORDER — PANTOPRAZOLE SODIUM 40 MG PO TBEC
40.0000 mg | DELAYED_RELEASE_TABLET | Freq: Every day | ORAL | Status: DC
  Administered 2018-05-30 – 2018-06-01 (×3): 40 mg via ORAL
  Filled 2018-05-30 (×2): qty 1

## 2018-05-30 MED ORDER — TECHNETIUM TC 99M MEBROFENIN IV KIT
7.5300 | PACK | Freq: Once | INTRAVENOUS | Status: AC | PRN
  Administered 2018-05-30: 7.53 via INTRAVENOUS

## 2018-05-30 MED ORDER — POTASSIUM CHLORIDE CRYS ER 20 MEQ PO TBCR
40.0000 meq | EXTENDED_RELEASE_TABLET | ORAL | Status: AC
  Administered 2018-05-30: 40 meq via ORAL
  Filled 2018-05-30 (×2): qty 2

## 2018-05-30 NOTE — Assessment & Plan Note (Signed)
Mild with some tenderness In setting of scleral icterus and pedal edema  Worrisome LFT and hepatitis tests now  Disc etoh - admits to 2 drinks per day during holidays and none since

## 2018-05-30 NOTE — Assessment & Plan Note (Signed)
Disc goals for lipids and reasons to control them Rev last labs with pt Rev low sat fat diet in detail LDL is going to be very high due to noncompliance with medication  Disc plan for this

## 2018-05-30 NOTE — Progress Notes (Addendum)
PROGRESS NOTE  Zachary Lewis CWC:[id number] DOB: 03-07-1961 DOA: 05/29/2018 PCP: Abner Greenspan, MD  HPI/Recap of past 24 hours: Patient was admitted for abnormal liver function.  Per his wife he denied any abdominal pain.  GI as well as general surgery were consulted patient is n.p.o.  He was ordered a HIDA scan results is available there was no gallstone.  Subjective: Patient is complaining of epigastric pain since he underwent HIDA scan this morning  May 31, 2018 subjective: Patient complaining that he still coughing.  He was exposed to flu his wife had the flu he was given Tamiflu for prophylaxis but he never started it.  A flu test was done and is positive for influenza A.  He was started on Tamiflu today.  Still denies abdominal pain.  Initially this morning he refused IV and blood draws.  Assessment/Plan: Principal Problem:   Acute cholecystitis Active Problems:   Diabetes type 2, uncontrolled (HCC)   Hyperlipidemia   Obesity   HYPERTENSION, BENIGN ESSENTIAL   Smoking   Transaminitis   Alcohol use   1.  Hepatitis with abnormal liver function and jaundice.  He was initially admitted for possible acalculous acute cholecystitis but his HIDA scan is negative for any gallstone.  General surgery has seen and thinks that this might be alcohol-related.  GI has seen patient and thinks that this is mostly alcohol-related .  We will continue to monitor his LFT.GI also ordered serology but patient had refused blood draw initially this morning he is agreeable to blood draw now we will continue to follow possibly discharge in the morning  2.  Leukocytosis.  May be due to influenza but that is high it is trending up to 25 yesterday from 15 on the day of admission.  We will recheck it in the morning and if is trending down he can be discharged home .  3.  Alcohol abuse alcohol withdrawal protocol is in place with CIWA evaluation.  Continue folic acid thiamine and multivitamin  4.   Uncontrolled type 2 diabetes mellitus with hemoglobin A1c of 10 we will continue IV insulin sliding scale.  Diabetic coordinator has been consulted  5.  Hyperlipidemia patient was on Zocor 20 mg at home this is on hold due to his abnormal LFT.  6.  Epigastric tenderness.  Patient was started on Protonix p.o.  7.  Tobacco abuse patient advised to quit smoking   8.  Hypokalemia.  replaced with IV yesterday.  Will recheck his potassium .  9.  Influenza A infection.  Patient is in isolation he has been started on Tamiflu he is doing well he is stable  Code Status: Full  Severity of Illness: The appropriate patient status for this patient is INPATIENT. Inpatient status is judged to be reasonable and necessary in order to provide the required intensity of service to ensure the patient's safety. The patient's presenting symptoms, physical exam findings, and initial radiographic and laboratory data in the context of their chronic comorbidities is felt to place them at high risk for further clinical deterioration. Furthermore, it is not anticipated that the patient will be medically stable for discharge from the hospital within 2 midnights of admission. The following factors support the patient status of inpatient.   " The patient's presenting symptoms include jaundice. " The worrisome physical exam findings include abnormal LFT. " The initial radiographic and laboratory data are worrisome because of abnormal LFTs versus gallstone. " The chronic co-morbidities include type 2 diabetes mellitus.   *  I certify that at the point of admission it is my clinical judgment that the patient will require inpatient hospital care spanning beyond 2 midnights from the point of admission due to high intensity of service, high risk for further deterioration and high frequency of surveillance required.*    Family Communication: Wife at bedside  Disposition Plan: Home when  stable   Consultants:  Gastroenterology  General surgery  Procedures:  HIDA scan  Antimicrobials: none DVT prophylaxis: SCD   Objective: Vitals:   05/29/18 2224 05/30/18 0500 05/30/18 0554 05/30/18 1120  BP: (!) 90/51  90/64 (!) 96/52  Pulse: 68  (!) 103 98  Resp: 20  17 18   Temp: 99.5 F (37.5 C)  99.8 F (37.7 C) 98.5 F (36.9 C)  TempSrc: Oral  Oral Oral  SpO2: 96%  96% 96%  Weight:  91.6 kg      Intake/Output Summary (Last 24 hours) at 05/30/2018 1146 Last data filed at 05/30/2018 0908 Gross per 24 hour  Intake 704.16 ml  Output -  Net 704.16 ml   Filed Weights   05/30/18 0500  Weight: 91.6 kg   Body mass index is 32.59 kg/m.  Exam:  . General: 58 y.o. year-old male well developed well nourished in no acute distress.  Alert and oriented x3.  Jaundiced . Cardiovascular: Regular rate and rhythm with no rubs or gallops.  No thyromegaly or JVD noted.   Marland Kitchen Respiratory: Clear to auscultation with no wheezes or rales. Good inspiratory effort. . Abdomen: Soft, vague epigastric tender, nondistended with normal bowel sounds x4 quadrants. . Musculoskeletal: No lower extremity edema. 2/4 pulses in all 4 extremities. . Skin: No ulcerative lesions noted or rashes, . Psychiatry: Mood is appropriate for condition and setting    Data Reviewed: CBC: Recent Labs  Lab 05/29/18 1030 05/29/18 2109 05/30/18 0535  WBC 15.2* 19.9* 25.9*  HGB 12.5* 11.6* 11.2*  HCT 37.7* 35.3* 34.6*  MCV 111.5* 110.3* 109.8*  PLT 313.0 312 237   Basic Metabolic Panel: Recent Labs  Lab 05/29/18 1030 05/29/18 2109 05/30/18 0535  NA 135  --  134*  K 3.6  --  2.5*  CL 91*  --  93*  CO2 32  --  28  GLUCOSE 317*  --  205*  BUN 14  --  20  CREATININE 0.81 0.99 1.31*  CALCIUM 9.4  --  8.3*  MG  --   --  1.9   GFR: Estimated Creatinine Clearance: 65.1 mL/min (A) (by C-G formula based on SCr of 1.31 mg/dL (H)). Liver Function Tests: Recent Labs  Lab 05/29/18 1030  05/30/18 0535  AST 100* 121*  ALT 69* 66*  ALKPHOS 946* 714*  BILITOT 11.3* 10.7*  PROT 6.5 6.2*  ALBUMIN 2.9* 2.0*   Recent Labs  Lab 05/29/18 1655  LIPASE 27   No results for input(s): AMMONIA in the last 168 hours. Coagulation Profile: Recent Labs  Lab 05/30/18 0535  INR 1.12   Cardiac Enzymes: No results for input(s): CKTOTAL, CKMB, CKMBINDEX, TROPONINI in the last 168 hours. BNP (last 3 results) No results for input(s): PROBNP in the last 8760 hours. HbA1C: Recent Labs    05/29/18 1030  HGBA1C 10.0*   CBG: Recent Labs  Lab 05/29/18 2050 05/30/18 1129  GLUCAP 229* 283*   Lipid Profile: Recent Labs    05/29/18 1030  CHOL 574*  HDL 14.20*  LDLCALC 522*  TRIG 190.0*  CHOLHDL 40   Thyroid Function Tests: Recent Labs  05/29/18 1030  TSH 3.49   Anemia Panel: No results for input(s): VITAMINB12, FOLATE, FERRITIN, TIBC, IRON, RETICCTPCT in the last 72 hours. Urine analysis: No results found for: COLORURINE, APPEARANCEUR, LABSPEC, PHURINE, GLUCOSEU, HGBUR, BILIRUBINUR, KETONESUR, PROTEINUR, UROBILINOGEN, NITRITE, LEUKOCYTESUR Sepsis Labs: @LABRCNTIP (procalcitonin:4,lacticidven:4)  )No results found for this or any previous visit (from the past 240 hour(s)).    Studies: Mr Abdomen Mrcp Wo Contrast  Result Date: 05/29/2018 CLINICAL DATA:  Abnormal liver function tests.  Transaminitis. EXAM: MRI ABDOMEN WITHOUT CONTRAST TECHNIQUE: Multiplanar multisequence MR imaging was performed without the administration of intravenous contrast. COMPARISON:  None. FINDINGS: Exam was suboptimal as patient had coughing and erratic breathing throughout the exam, and could not lay still. Several attempts were made, but patient was unable to tolerate all imaging sequences being performed. Several sequences which were obtained show the following: Lower chest: No acute findings. Hepatobiliary: Hepatic cirrhosis is demonstrated. No liver masses are identified on this unenhanced  exam. The gallbladder shows diffuse wall thickening but is not distended, and this is likely secondary to hepatic cirrhosis. No evidence of biliary ductal dilatation. Pancreas: No mass or inflammatory process visualized on this unenhanced exam. Spleen:  Within normal limits in size. Adrenals/Urinary tract: Unremarkable. No evidence of hydronephrosis. Stomach/Bowel: Visualized portion unremarkable. Vascular/Lymphatic: No pathologically enlarged lymph nodes identified. No evidence of abdominal aortic aneurysm. Other:  Minimal perihepatic ascites. Musculoskeletal:  No suspicious bone lesions identified. IMPRESSION: Technically suboptimal exam as described above. Hepatic cirrhosis and minimal ascites. Small gallbladder with diffuse wall thickening, likely secondary to cirrhosis. No evidence of biliary ductal dilatation. Electronically Signed   By: Earle Gell M.D.   On: 05/29/2018 21:02   US Abdomen Limited  Result Date: 05/29/2018 CLINICAL DATA:  Abnormal liver function tests EXAM: ULTRASOUND ABDOMEN LIMITED RIGHT UPPER QUADRANT COMPARISON:  None. FINDINGS: Gallbladder: There are no gallstones. The gallbladder wall is thickened with hypoechoic signal centrally. It measures up to 6 mm. Pericholecystic fluid is also present. Negative sonographic Murphy sign. Common bile duct: Diameter: 2 mm in caliber. Liver: The liver is diffusely increased in echogenicity with heterogeneity of the liver echotexture. There is focal hypoechoic signal adjacent to the gallbladder with a geographic border. Portal vein is patent on color Doppler imaging with normal direction of blood flow towards the liver. IMPRESSION: There is gallbladder wall thickening, gallbladder wall edema, and pericholecystic fluid. There is no Murphy sign or gallstones. These findings are nonspecific. Correlate clinically as for the need for further imaging. An inflammatory process is not excluded. The liver is diffusely heterogeneous and increased in  echogenicity. Findings may represent diffuse hepatic steatosis. Diffuse hepatic parenchymal disease can have a similar appearance. Hypoechoic signal adjacent to the gallbladder is favored represent relative sparing of fatty infiltration. Electronically Signed   By: Marybelle Killings M.D.   On: 05/29/2018 16:24    Scheduled Meds: . folic acid  1 mg Oral Daily  . heparin  5,000 Units Subcutaneous Q8H  . hydrochlorothiazide  25 mg Oral Daily  . insulin aspart  0-5 Units Subcutaneous QHS  . insulin aspart  0-9 Units Subcutaneous TID WC  . insulin glargine  10 Units Subcutaneous QHS  . lisinopril  20 mg Oral Daily  . multivitamin with minerals  1 tablet Oral Daily  . potassium chloride  40 mEq Oral Q4H  . thiamine  100 mg Oral Daily   Or  . thiamine  100 mg Intravenous Daily    Continuous Infusions: . dextrose 5 % and 0.45% NaCl 100  mL/hr at 05/30/18 0607  . piperacillin-tazobactam (ZOSYN)  IV 3.375 g (05/30/18 1139)     LOS: 1 day     Cristal Deer, MD [facility]  To reach me or the doctor on call, go to: [email or web address] Password [facility]  05/30/2018, 11:46 AM

## 2018-05-30 NOTE — Assessment & Plan Note (Signed)
Worrisome New  Also fatigue and pedal edema and RUQ abd discomfort  Lab today- liver/ cbc/hepaitits profile  Enc to avoid etoh or acetaminophen  Admits to 2 drinks per day around holidays

## 2018-05-30 NOTE — Consult Note (Signed)
Reason for Consult:  Possible acute acalculous cholecystitis, jaundice Referring Physician: Joelyn Oms  Zachary Lewis is an 58 y.o. male.  HPI: This is a 58 year old male with poorly controlled DM2, obesity, hyperlipidemia, tobacco abuse who was admitted yesterday by [facility] for abnormal LFT's and weakness.  He developed jaundice last week and has had nausea, and vomiting.  Poor appetite.  No fever or chills.  He was seen by PCP who noted T bili of 11.3, direct 7.0.  He was sent to the ED for evaluation.  US showed signs of cirrhosis and mild GB wall thickening, but no gallstones.  MRCP was also performed.  GI has not yet seen the patient.    Past Medical History:  Diagnosis Date  . Diabetes mellitus    Type II  . Hyperlipidemia   . Hypertension   . Obesity   . Personal history of colonic adenomas 03/09/2013  . Tinea pedis    Chronic  Fatty liver  Past Surgical History:  Procedure Laterality Date  . myringectomy  06/2005  . UMBILICAL HERNIA REPAIR  01/1997  . VASECTOMY  07/2002    Family History  Problem Relation Age of Onset  . Diabetes Neg Hx   . Heart disease Neg Hx   . Colon cancer Neg Hx     Social History:  reports that he has been smoking. He has been smoking about 0.25 packs per day. He has never used smokeless tobacco. He reports current alcohol use. He reports that he does not use drugs. He reports a history of heavy drinking and continues to drink on a regular basis  Allergies: No Known Allergies  Medications:  Prior to Admission medications   Medication Sig Start Date End Date Taking? Authorizing Provider  glipiZIDE (GLUCOTROL XL) 10 MG 24 hr tablet TAKE 1 TABLET(10 MG) BY MOUTH DAILY Patient taking differently: Take 10 mg by mouth daily.  05/29/18  Yes Tower, Wynelle Fanny, MD  lisinopril-hydrochlorothiazide (PRINZIDE,ZESTORETIC) 20-25 MG tablet Take 1 tablet by mouth daily. 05/29/18  Yes Tower, Wynelle Fanny, MD  metFORMIN (GLUCOPHAGE) 500 MG tablet TAKE 1 TABLET(500 MG) BY  MOUTH TWICE DAILY WITH A MEAL Patient taking differently: Take 500 mg by mouth 2 (two) times daily with a meal.  05/29/18  Yes Tower, Wynelle Fanny, MD  simvastatin (ZOCOR) 20 MG tablet TAKE 1 TABLET(20 MG) BY MOUTH AT BEDTIME Patient taking differently: Take 20 mg by mouth at bedtime.  05/29/18  Yes Tower, Wynelle Fanny, MD  Blood Glucose Monitoring Suppl (BLOOD GLUCOSE METER) kit To check glucose once daily and as needed for diabetes 10/30/11   Tower, Wynelle Fanny, MD  Lancets MISC To check glucose once daily and as needed for diabetes 10/30/11   Tower, Wynelle Fanny, MD  oseltamivir (TAMIFLU) 75 MG capsule Take 1 capsule (75 mg total) by mouth daily. 05/29/18   Tower, Wynelle Fanny, MD   He has not started taking the Tamiflu yet.  Results for orders placed or performed during the hospital encounter of 05/29/18 (from the past 48 hour(s))  Lipase, blood     Status: None   Collection Time: 05/29/18  4:55 PM  Result Value Ref Range   Lipase 27 11 - 51 U/L    Comment: Performed at [facility], [place] [address], [place], [place] [address]  Glucose, capillary     Status: Abnormal   Collection Time: 05/29/18  8:50 PM  Result Value Ref Range   Glucose-Capillary 229 (H) 70 -  99 mg/dL  CBC     Status: Abnormal   Collection Time: 05/29/18  9:09 PM  Result Value Ref Range   WBC 19.9 (H) 4.0 - 10.5 K/uL   RBC 3.20 (L) 4.22 - 5.81 MIL/uL   Hemoglobin 11.6 (L) 13.0 - 17.0 g/dL   HCT 35.3 (L) 39.0 - 52.0 %   MCV 110.3 (H) 80.0 - 100.0 fL   MCH 36.3 (H) 26.0 - 34.0 pg   MCHC 32.9 30.0 - 36.0 g/dL   RDW 15.3 11.5 - 15.5 %   Platelets 312 150 - 400 K/uL   nRBC 0.2 0.0 - 0.2 %    Comment: Performed at [facility] [address], [place], [place] [address]  Creatinine, serum     Status: None   Collection Time: 05/29/18  9:09 PM  Result Value Ref Range   Creatinine, Ser 0.99 0.61 - 1.24 mg/dL   GFR calc non Af Amer >60 >60 mL/min   GFR calc Af Amer >60 >60 mL/min    Comment: Performed at [facility] [address], [place], [place] [address]  Protime-INR     Status: None   Collection Time: 05/30/18  5:35 AM  Result Value Ref Range   Prothrombin Time 14.3 11.4 - 15.2 seconds   INR 1.12     Comment: Performed at [facility] [address], [place], [place] [address]  APTT     Status: None   Collection Time: 05/30/18  5:35 AM  Result Value Ref Range   aPTT 30 24 - 36 seconds    Comment: Performed at [facility] [address] [place], [place] [address]  Comprehensive metabolic panel     Status: Abnormal   Collection Time: 05/30/18  5:35 AM  Result Value Ref Range   Sodium 134 (L) 135 - 145 mmol/L   Potassium 2.5 (LL) 3.5 - 5.1 mmol/L    Comment: CRITICAL RESULT CALLED TO, READ BACK BY AND VERIFIED WITH: Burtis Junes AT 0705 05/30/2018 BY L BENFIELD    Chloride 93 (L) 98 - 111 mmol/L   CO2 28 22 - 32 mmol/L   Glucose, Bld 205 (H) 70 - 99 mg/dL   BUN 20 6 - 20 mg/dL   Creatinine, Ser 1.31 (H) 0.61 - 1.24 mg/dL   Calcium 8.3 (L) 8.9 - 10.3 mg/dL   Total Protein 6.2 (L) 6.5 - 8.1 g/dL   Albumin 2.0 (L) 3.5 - 5.0 g/dL   AST 121 (H) 15 - 41 U/L   ALT 66 (H) 0 - 44 U/L   Alkaline Phosphatase 714 (H) 38 - 126 U/L   Total Bilirubin 10.7 (H) 0.3 - 1.2 mg/dL   GFR calc non Af Amer 60 (L) >60 mL/min   GFR calc Af Amer >60 >60 mL/min   Anion gap 13 5 - 15    Comment: Performed at [facility], [place] [address] [place], [place] [address]  CBC     Status: Abnormal   Collection Time: 05/30/18  5:35 AM  Result Value Ref Range   WBC 25.9 (H) 4.0 - 10.5 K/uL   RBC 3.15 (L) 4.22 - 5.81 MIL/uL   Hemoglobin 11.2 (L) 13.0 - 17.0 g/dL   HCT 34.6 (L) 39.0 - 52.0 %   MCV 109.8 (H) 80.0 - 100.0 fL   MCH 35.6 (H) 26.0 - 34.0 pg   MCHC 32.4 30.0 - 36.0 g/dL   RDW 15.6 (H)  11.5 - 15.5 %   Platelets 296 150 - 400 K/uL   nRBC 0.1 0.0 - 0.2 %    Comment: Performed at [facility], [place] [address] [place], [place] [address]  Magnesium     Status: None   Collection Time: 05/30/18   5:35 AM  Result Value Ref Range   Magnesium 1.9 1.7 - 2.4 mg/dL    Comment: Performed at [facility] [address], [place], [place] [address]  Glucose, capillary     Status: Abnormal   Collection Time: 05/30/18 11:29 AM  Result Value Ref Range   Glucose-Capillary 283 (H) 70 - 99 mg/dL    Mr Abdomen Mrcp Wo Contrast  Result Date: 05/29/2018 CLINICAL DATA:  Abnormal liver function tests.  Transaminitis. EXAM: MRI ABDOMEN WITHOUT CONTRAST TECHNIQUE: Multiplanar multisequence MR imaging was performed without the administration of intravenous contrast. COMPARISON:  None. FINDINGS: Exam was suboptimal as patient had coughing and erratic breathing throughout the exam, and could not lay still. Several attempts were made, but patient was unable to tolerate all imaging sequences being performed. Several sequences which were obtained show the following: Lower chest: No acute findings. Hepatobiliary: Hepatic cirrhosis is demonstrated. No liver masses are identified on this unenhanced exam. The gallbladder shows diffuse wall thickening but is not distended, and this is likely secondary to hepatic cirrhosis. No evidence of biliary ductal dilatation. Pancreas: No mass or inflammatory process visualized on this unenhanced exam. Spleen:  Within normal limits in size. Adrenals/Urinary tract: Unremarkable. No evidence of hydronephrosis. Stomach/Bowel: Visualized portion unremarkable. Vascular/Lymphatic: No pathologically enlarged lymph nodes identified. No evidence of abdominal aortic aneurysm. Other:  Minimal perihepatic ascites. Musculoskeletal:  No suspicious bone lesions identified. IMPRESSION: Technically suboptimal exam as described above. Hepatic cirrhosis and minimal ascites. Small gallbladder with diffuse wall thickening, likely secondary to cirrhosis. No evidence of biliary ductal dilatation. Electronically Signed   By: Earle Gell M.D.   On: 05/29/2018 21:02   Nm Hepato W/eject Fract  Result Date:  05/30/2018 CLINICAL DATA:  Inpatient.  Right upper quadrant abdominal pain. EXAM: NUCLEAR MEDICINE HEPATOBILIARY IMAGING WITH GALLBLADDER EF TECHNIQUE: Sequential images of the abdomen were obtained out to 60 minutes following intravenous administration of radiopharmaceutical. After oral ingestion of Ensure, gallbladder ejection fraction was determined. At 60 min, normal ejection fraction is greater than 33%. RADIOPHARMACEUTICALS:  7.5 mCi Tc-36m Choletec IV COMPARISON:  05/29/2018 MRI abdomen and abdominal sonogram. FINDINGS: Prompt uptake and biliary excretion of activity by the liver is seen. Gallbladder activity is visualized, consistent with patency of cystic duct. Biliary activity passes into small bowel, consistent with patent common bile duct. Calculated gallbladder ejection fraction is 43%. (Normal gallbladder ejection fraction with Ensure is greater than 33%.) IMPRESSION: Normal hepatobiliary scintigraphy study. Patent cystic duct, which is not compatible with acute cholecystitis. Patent common bile duct. Normal gallbladder ejection fraction. Electronically Signed   By: JIlona SorrelM.D.   On: 05/30/2018 11:47   [place]Abdomen Limited  Result Date: 05/29/2018 CLINICAL DATA:  Abnormal liver function tests EXAM: ULTRASOUND ABDOMEN LIMITED RIGHT UPPER QUADRANT COMPARISON:  None. FINDINGS: Gallbladder: There are no gallstones. The gallbladder wall is thickened with hypoechoic signal centrally. It measures up to 6 mm. Pericholecystic fluid is also present. Negative sonographic Murphy sign. Common bile duct: Diameter: 2 mm in caliber. Liver: The liver is diffusely increased in echogenicity with heterogeneity of the liver echotexture. There is focal hypoechoic signal adjacent to the gallbladder with a geographic border. Portal vein  is patent on color Doppler imaging with normal direction of blood flow towards the liver. IMPRESSION: There is gallbladder wall thickening, gallbladder wall edema, and pericholecystic  fluid. There is no Murphy sign or gallstones. These findings are nonspecific. Correlate clinically as for the need for further imaging. An inflammatory process is not excluded. The liver is diffusely heterogeneous and increased in echogenicity. Findings may represent diffuse hepatic steatosis. Diffuse hepatic parenchymal disease can have a similar appearance. Hypoechoic signal adjacent to the gallbladder is favored represent relative sparing of fatty infiltration. Electronically Signed   By: Marybelle Killings M.D.   On: 05/29/2018 16:24    Review of Systems  Constitutional: Negative for weight loss.  HENT: Negative for ear discharge, ear pain, hearing loss and tinnitus.   Eyes: Negative for blurred vision, double vision, photophobia and pain.  Respiratory: Negative for cough, sputum production and shortness of breath.   Cardiovascular: Negative for chest pain.  Gastrointestinal: Positive for abdominal pain, nausea and vomiting.  Genitourinary: Negative for dysuria (dark urine), flank pain, frequency and urgency.  Musculoskeletal: Negative for back pain, falls, joint pain, myalgias and neck pain.  Neurological: Negative for dizziness, tingling, sensory change, focal weakness, loss of consciousness and headaches.  Endo/Heme/Allergies: Does not bruise/bleed easily.  Psychiatric/Behavioral: Negative for depression, memory loss and substance abuse. The patient is not nervous/anxious.    Blood pressure (!) 96/52, pulse 98, temperature 98.5 F (36.9 C), temperature source Oral, resp. rate 18, weight 91.6 kg, SpO2 96 %. Physical Exam WDWN in NAD Eyes:  Pupils equal, round; sclera icteric HENT:  Oral mucosa moist; good dentition  Neck:  No masses palpated, no thyromegaly Lungs:  CTA bilaterally; normal respiratory effort CV:  Regular rate and rhythm; no murmurs; extremities well-perfused with no edema Abd:  +bowel sounds, soft, mildly tender in RUQ and epigastrium, no palpable organomegaly; no palpable  hernias Skin:  Warm, dry; jaundiced Psychiatric - alert and oriented x 4; calm mood and affect  Assessment/Plan: No sign of acute cholecystitis - negative HIDA, negative MRCP - no stones on [place].  Bilirubin this high not usually associated with acute cholecystitis Jaundice, abnormal LFT's, cirrhosis - history of heavy EtOH, could be exacerbated by medications or other etiology.   Wall thickening and edema secondary to cirrhosis  GI consult Hepatitis panel Evaluate medications for possible hepatic side effects.  Will sign off for now.  Management per GI.  Imogene Burn Aishani Kalis 05/30/2018, 12:28 PM

## 2018-05-30 NOTE — Assessment & Plan Note (Signed)
Assoc with scleral icterus  LFTS and hepatitis panel drawn

## 2018-05-30 NOTE — Assessment & Plan Note (Signed)
Worrisome in setting of new icterus and fatigue  LFTs today

## 2018-05-30 NOTE — Consult Note (Signed)
Consultation  Referring Provider:    [facility] Primary Care Physician:  Tower, Wynelle Fanny, MD Primary Gastroenterologist:        Carlean Purl Reason for Consultation:     Jaundice     Impression / Plan:   Alcoholic liver disease with jaundice which I think may be alcoholic hepatitis rather than cirrhosis.  He has a very enlarged liver with steatosis seen on ultrasound and the MRI.  His platelets are normal.  His coags are normal.  He certainly may have fibrosis but he does not seem to have decompensated cirrhosis outside of jaundice which again I suspect his alcoholic hepatitis.  It may explain his leukocytosis as well though he has been exposed to the flu and could be developing that.  His poorly controlled diabetes mellitus nay also be contributing to fatty liver (? Component of NAFLD and AFLD)  I will do some additional serologic work-up looking for autoimmune problems evaluate his iron levels though they may be high because of alcoholic liver disease, I will also evaluate for alpha 1 antitrypsin deficiency.  He is too old to have Wilson's disease.  It would have shown up by now.   The respect to his liver dysfunction he should be able to go home soon that we will have to see how he does overall. He does not need Zosyn that I can see so I discontinued (he does not have cholecystitis).  Gatha Mayer, MD, [facility] 05/30/2018 6:59 PM Pager [phone]          HPI:   Zachary Lewis is a 58 y.o. male with a history of poorly controlled type 2 diabetes mellitus, alcohol abuse and new onset of jaundice who was admitted to the hospital because of the jaundice.  In December he and his wife say he would spit up or vomit after eating, that lasted about a week.  He was seen by primary care, Dr. Glori Bickers the other day and noticed to be jaundiced and was admitted to the hospital.  Ultrasound demonstrated some pericholecystic fluid and thickened gallbladder wall and there was a  question of cholecystitis, a calculus raised.  The patient had an MRI that was motion degraded and was read out as findings consistent with cirrhosis and some trace ascites. HID scan negative.  He admits to drinking 2-3 "small bottles" of [place] Mist most days.  He has been coughing, he does not body aches he does not have a fever.  He has not had nausea and vomiting now.  Wife tested positive for the flu, she had contact with 10 confirmed cases in the childcare ~she worked and she was treated with Tamiflu.  The patient was prescribed Tamiflu by Dr. Glori Bickers but has not yet started it.  He has been seen by surgery who do not think he has cholecystitis.  He does not have any pain.  GI history notable for history of colon polyps, I had done that in 2014 he is overdue for repeat colonoscopy it was to be done again in 2017.  He had 5 tubular adenomas maximum 7 mm.  Past Medical History:  Diagnosis Date  . Diabetes mellitus    Type II  . Hyperlipidemia   . Hypertension   . Obesity   . Personal history of colonic adenomas 03/09/2013  . Tinea pedis    Chronic    Past Surgical History:  Procedure Laterality Date  . COLONOSCOPY W/ POLYPECTOMY  2014  . myringectomy  06/2005  .  UMBILICAL HERNIA REPAIR  01/1997  . VASECTOMY  07/2002    Family History  Problem Relation Age of Onset  . Diabetes Neg Hx   . Heart disease Neg Hx   . Colon cancer Neg Hx      Social History   Tobacco Use  . Smoking status: Current Every Day Smoker    Packs/day: 0.25  . Smokeless tobacco: Never Used  Substance Use Topics  . 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 Drug use: No    Prior to Admission medications   Medication Sig Start Date End Date Taking? Authorizing Provider  glipiZIDE (GLUCOTROL XL) 10 MG 24 hr tablet TAKE 1 TABLET(10 MG) BY MOUTH DAILY Patient taking differently: Take 10 mg by mouth daily.  05/29/18  Yes Tower, Wynelle Fanny, MD    lisinopril-hydrochlorothiazide (PRINZIDE,ZESTORETIC) 20-25 MG tablet Take 1 tablet by mouth daily. 05/29/18  Yes Tower, Wynelle Fanny, MD  metFORMIN (GLUCOPHAGE) 500 MG tablet TAKE 1 TABLET(500 MG) BY MOUTH TWICE DAILY WITH A MEAL Patient taking differently: Take 500 mg by mouth 2 (two) times daily with a meal.  05/29/18  Yes Tower, Wynelle Fanny, MD  simvastatin (ZOCOR) 20 MG tablet TAKE 1 TABLET(20 MG) BY MOUTH AT BEDTIME Patient taking differently: Take 20 mg by mouth at bedtime.  05/29/18  Yes Tower, Wynelle Fanny, MD  Blood Glucose Monitoring Suppl (BLOOD GLUCOSE METER) kit To check glucose once daily and as needed for diabetes 10/30/11   Tower, Wynelle Fanny, MD  Lancets MISC To check glucose once daily and as needed for diabetes 10/30/11   Tower, Wynelle Fanny, MD  oseltamivir (TAMIFLU) 75 MG capsule Take 1 capsule (75 mg total) by mouth daily. 05/29/18   Tower, Wynelle Fanny, MD    Current Facility-Administered Medications  Medication Dose Route Frequency Provider Last Rate Last Dose  . acetaminophen (TYLENOL) tablet 650 mg  650 mg Oral Q6H PRN Amin, Ankit Chirag, MD       Or  . acetaminophen (TYLENOL) suppository 650 mg  650 mg Rectal Q6H PRN Amin, Ankit Chirag, MD      . alum & mag hydroxide-simeth (MAALOX/MYLANTA) 200-200-20 MG/5ML suspension 30 mL  30 mL Oral Q4H PRN Amin, Ankit Chirag, MD      . bisacodyl (DULCOLAX) EC tablet 5 mg  5 mg Oral Daily PRN Amin, Ankit Chirag, MD      . dextrose 5 %-0.45 % sodium chloride infusion   Intravenous Continuous Amin, Ankit Chirag, MD 100 mL/hr at 05/30/18 0607    . folic acid (FOLVITE) tablet 1 mg  1 mg Oral Daily Amin, Ankit Chirag, MD      . guaiFENesin-dextromethorphan (ROBITUSSIN DM) 100-10 MG/5ML syrup 5 mL  5 mL Oral Q4H PRN Amin, Ankit Chirag, MD   5 mL at 05/30/18 1610  . heparin injection 5,000 Units  5,000 Units Subcutaneous Q8H Amin, Ankit Chirag, MD   5,000 Units at 05/30/18 1459  . hydrochlorothiazide (HYDRODIURIL) tablet 25 mg  25 mg Oral Daily Amin, Ankit Chirag, MD       . HYDROcodone-acetaminophen (NORCO/VICODIN) 5-325 MG per tablet 1-2 tablet  1-2 tablet Oral Q4H PRN Amin, Ankit Chirag, MD      . hydrocortisone (ANUSOL-HC) 2.5 % rectal cream 1 application  1 application Topical QID PRN Amin, Ankit Chirag, MD      . hydrocortisone cream 1 % 1 application  1 application Topical TID PRN Damita Lack, MD      .  insulin aspart (novoLOG) injection 0-5 Units  0-5 Units Subcutaneous QHS Damita Lack, MD   2 Units at 05/29/18 2136  . insulin aspart (novoLOG) injection 0-9 Units  0-9 Units Subcutaneous TID WC Amin, Ankit Chirag, MD   6 Units at 05/30/18 1148  . insulin glargine (LANTUS) injection 10 Units  10 Units Subcutaneous QHS Damita Lack, MD   10 Units at 05/29/18 2137  . ipratropium-albuterol (DUONEB) 0.5-2.5 (3) MG/3ML nebulizer solution 3 mL  3 mL Nebulization Q4H PRN Amin, Ankit Chirag, MD      . lip balm (BLISTEX) ointment 1 application  1 application Topical PRN Amin, Ankit Chirag, MD      . lisinopril (PRINIVIL,ZESTRIL) tablet 20 mg  20 mg Oral Daily Amin, Ankit Chirag, MD      . loratadine (CLARITIN) tablet 10 mg  10 mg Oral Daily PRN Amin, Ankit Chirag, MD      . LORazepam (ATIVAN) tablet 1 mg  1 mg Oral Q6H PRN Amin, Jeanella Flattery, MD       Or  . LORazepam (ATIVAN) injection 1 mg  1 mg Intravenous Q6H PRN Amin, Ankit Chirag, MD      . morphine 2 MG/ML injection 2 mg  2 mg Intravenous Q3H PRN Amin, Ankit Chirag, MD      . multivitamin with minerals tablet 1 tablet  1 tablet Oral Daily Amin, Ankit Chirag, MD      . MUSCLE RUB CREA 1 application  1 application Topical PRN Amin, Ankit Chirag, MD      . ondansetron (ZOFRAN) tablet 4 mg  4 mg Oral Q6H PRN Amin, Ankit Chirag, MD       Or  . ondansetron (ZOFRAN) injection 4 mg  4 mg Intravenous Q6H PRN Amin, Ankit Chirag, MD      . pantoprazole (PROTONIX) EC tablet 40 mg  40 mg Oral Daily Cristal Deer, MD   40 mg at 05/30/18 1828  . phenol (CHLORASEPTIC) mouth spray 1 spray  1 spray  Mouth/Throat PRN Amin, Ankit Chirag, MD      . piperacillin-tazobactam (ZOSYN) IVPB 3.375 g  3.375 g Intravenous Q8H Mancheril, Darnell Level, RPH 12.5 mL/hr at 05/30/18 1139 3.375 g at 05/30/18 1139  . polyethylene glycol (MIRALAX / GLYCOLAX) packet 17 g  17 g Oral Daily PRN Amin, Ankit Chirag, MD      . polyvinyl alcohol (LIQUIFILM TEARS) 1.4 % ophthalmic solution 1 drop  1 drop Both Eyes PRN Amin, Ankit Chirag, MD      . potassium chloride SA (K-DUR,KLOR-CON) CR tablet 40 mEq  40 mEq Oral Q4H Cristal Deer, MD   40 mEq at 05/30/18 1557  . senna-docusate (Senokot-S) tablet 1 tablet  1 tablet Oral QHS PRN Amin, Ankit Chirag, MD      . sodium chloride (OCEAN) 0.65 % nasal spray 1 spray  1 spray Each Nare PRN Amin, Ankit Chirag, MD      . thiamine (VITAMIN B-1) tablet 100 mg  100 mg Oral Daily Amin, Ankit Chirag, MD       Or  . thiamine (B-1) injection 100 mg  100 mg Intravenous Daily Amin, Jeanella Flattery, MD        Allergies as of 05/29/2018  . (No Known Allergies)     Review of Systems:    This is positive for those things mentioned in the HPI, All other review of systems are negative.       Physical Exam:  Vital signs in last 24  hours: Temp:  [98.5 F (36.9 C)-99.8 F (37.7 C)] 98.9 F (37.2 C) (01/11 1334) Pulse Rate:  [68-106] 106 (01/11 1334) Resp:  [17-20] 18 (01/11 1334) BP: (90-128)/(51-77) 128/77 (01/11 1334) SpO2:  [93 %-96 %] 93 % (01/11 1334) Weight:  [91.6 kg] 91.6 kg (01/11 0500) Last BM Date: 05/29/18  General:  Well-developed, well-nourished and in no acute distress Eyes:  icteric. ENT:   Mouth and posterior pharynx free of lesions.  Neck:   supple w/o thyromegaly or mass.  Lungs: Clear to auscultation bilaterally. Heart:  S1S2, no rubs, murmurs, gallops. Abdomen:  obese soft, non-tender, + hepatomegaly, BS+.  Lymph:  no cervical or supraclavicular adenopathy. Extremities:   no edema Skin   no rash. Neuro:  A&O x 3.  Psych:  appropriate mood and   Affect.   Data Reviewed:   LAB RESULTS: Recent Labs    05/29/18 1030 05/29/18 2109 05/30/18 0535  WBC 15.2* 19.9* 25.9*  HGB 12.5* 11.6* 11.2*  HCT 37.7* 35.3* 34.6*  PLT 313.0 312 296   BMET Recent Labs    05/29/18 1030 05/29/18 2109 05/30/18 0535  NA 135  --  134*  K 3.6  --  2.5*  CL 91*  --  93*  CO2 32  --  28  GLUCOSE 317*  --  205*  BUN 14  --  20  CREATININE 0.81 0.99 1.31*  CALCIUM 9.4  --  8.3*   LFT Recent Labs    05/29/18 1030 05/30/18 0535  PROT 6.5 6.2*  ALBUMIN 2.9* 2.0*  AST 100* 121*  ALT 69* 66*  ALKPHOS 946* 714*  BILITOT 11.3* 10.7*  BILIDIR 7.0*  --    PT/INR Recent Labs    05/30/18 0535  LABPROT 14.3  INR 1.12    STUDIES: Images viewed Mr Abdomen Mrcp Wo Contrast  Result Date: 05/29/2018 CLINICAL DATA:  Abnormal liver function tests.  Transaminitis. EXAM: MRI ABDOMEN WITHOUT CONTRAST TECHNIQUE: Multiplanar multisequence MR imaging was performed without the administration of intravenous contrast. COMPARISON:  None. FINDINGS: Exam was suboptimal as patient had coughing and erratic breathing throughout the exam, and could not lay still. Several attempts were made, but patient was unable to tolerate all imaging sequences being performed. Several sequences which were obtained show the following: Lower chest: No acute findings. Hepatobiliary: Hepatic cirrhosis is demonstrated. No liver masses are identified on this unenhanced exam. The gallbladder shows diffuse wall thickening but is not distended, and this is likely secondary to hepatic cirrhosis. No evidence of biliary ductal dilatation. Pancreas: No mass or inflammatory process visualized on this unenhanced exam. Spleen:  Within normal limits in size. Adrenals/Urinary tract: Unremarkable. No evidence of hydronephrosis. Stomach/Bowel: Visualized portion unremarkable. Vascular/Lymphatic: No pathologically enlarged lymph nodes identified. No evidence of abdominal aortic aneurysm. Other:  Minimal  perihepatic ascites. Musculoskeletal:  No suspicious bone lesions identified. IMPRESSION: Technically suboptimal exam as described above. Hepatic cirrhosis and minimal ascites. Small gallbladder with diffuse wall thickening, likely secondary to cirrhosis. No evidence of biliary ductal dilatation. Electronically Signed   By: Earle Gell M.D.   On: 05/29/2018 21:02   Nm Hepato W/eject Fract  Result Date: 05/30/2018 CLINICAL DATA:  Inpatient.  Right upper quadrant abdominal pain. EXAM: NUCLEAR MEDICINE HEPATOBILIARY IMAGING WITH GALLBLADDER EF TECHNIQUE: Sequential images of the abdomen were obtained out to 60 minutes following intravenous administration of radiopharmaceutical. After oral ingestion of Ensure, gallbladder ejection fraction was determined. At 60 min, normal ejection fraction is greater than 33%. RADIOPHARMACEUTICALS:  7.5  mCi Tc-60m Choletec IV COMPARISON:  05/29/2018 MRI abdomen and abdominal sonogram. FINDINGS: Prompt uptake and biliary excretion of activity by the liver is seen. Gallbladder activity is visualized, consistent with patency of cystic duct. Biliary activity passes into small bowel, consistent with patent common bile duct. Calculated gallbladder ejection fraction is 43%. (Normal gallbladder ejection fraction with Ensure is greater than 33%.) IMPRESSION: Normal hepatobiliary scintigraphy study. Patent cystic duct, which is not compatible with acute cholecystitis. Patent common bile duct. Normal gallbladder ejection fraction. Electronically Signed   By: JIlona SorrelM.D.   On: 05/30/2018 11:47   UKoreaAbdomen Limited  Result Date: 05/29/2018 CLINICAL DATA:  Abnormal liver function tests EXAM: ULTRASOUND ABDOMEN LIMITED RIGHT UPPER QUADRANT COMPARISON:  None. FINDINGS: Gallbladder: There are no gallstones. The gallbladder wall is thickened with hypoechoic signal centrally. It measures up to 6 mm. Pericholecystic fluid is also present. Negative sonographic Murphy sign. Common bile duct:  Diameter: 2 mm in caliber. Liver: The liver is diffusely increased in echogenicity with heterogeneity of the liver echotexture. There is focal hypoechoic signal adjacent to the gallbladder with a geographic border. Portal vein is patent on color Doppler imaging with normal direction of blood flow towards the liver. IMPRESSION: There is gallbladder wall thickening, gallbladder wall edema, and pericholecystic fluid. There is no Murphy sign or gallstones. These findings are nonspecific. Correlate clinically as for the need for further imaging. An inflammatory process is not excluded. The liver is diffusely heterogeneous and increased in echogenicity. Findings may represent diffuse hepatic steatosis. Diffuse hepatic parenchymal disease can have a similar appearance. Hypoechoic signal adjacent to the gallbladder is favored represent relative sparing of fatty infiltration. Electronically Signed   By: AMarybelle KillingsM.D.   On: 05/29/2018 16:24      Thanks   LOS: 1 day   _0  E. GCarlean Purl MD, FMedical [facility]@  05/30/2018, 6:53 PM

## 2018-05-30 NOTE — Assessment & Plan Note (Signed)
Discussed how this problem influences overall health and the risks it imposes  Reviewed plan for weight loss with lower calorie diet (via better food choices and also portion control or program like weight watchers) and exercise building up to or more than 30 minutes 5 days per week including some aerobic activity    

## 2018-05-30 NOTE — Assessment & Plan Note (Signed)
bp in fair control at this time  BP Readings from Last 1 Encounters:  05/30/18 128/77   No changes needed Most recent labs reviewed  Disc lifstyle change with low sodium diet and exercise

## 2018-05-30 NOTE — Assessment & Plan Note (Signed)
tamiflu dosed for prophylaxis

## 2018-05-30 NOTE — Progress Notes (Signed)
Inpatient Diabetes Program Recommendations  AACE/ADA: New Consensus Statement on Inpatient Glycemic Control (2015)  Target Ranges:  Prepandial:   less than 140 mg/dL      Peak postprandial:   less than 180 mg/dL (1-2 hours)      Critically ill patients:  140 - 180 mg/dL   Lab Results  Component Value Date   GLUCAP 229 (H) 05/29/2018   HGBA1C 10.0 (H) 05/29/2018    Review of Glycemic Control Results for Zachary Lewis, Zachary Lewis (MRN [id number]) as of 05/30/2018 10:30  Ref. Range 05/29/2018 20:50  Glucose-Capillary Latest Ref Range: 70 - 99 mg/dL 229 (H)   Diabetes history: DM 2 Outpatient Diabetes medications:  Glipizide 10 mg daily, Metformin 500 mg bid Current orders for Inpatient glycemic control:  Lantus 10 units daily, Novolog sensitive tid with meals and HS  Inpatient Diabetes Program Recommendations:    Referral received.  Agree with current orders.  Will attempt to see patient on 1/13 regarding elevated A1C.  Due to jaundice, consider d/c of Metformin at d/c.   Thanks,  Adah Perl, RN, BC-ADM Inpatient Diabetes Coordinator Pager [phone] (8a-5p)

## 2018-05-30 NOTE — Progress Notes (Signed)
CRITICAL VALUE ALERT  Critical Value:  Potassium 2.5  Date & Time Notied:  05/30/18  Provider Notified: Cristal Deer, MD  Orders Received/Actions taken: Awaiting for orders.

## 2018-05-30 NOTE — Assessment & Plan Note (Signed)
Disc in detail risks of smoking and possible outcomes including copd, vascular/ heart disease, cancer , respiratory and sinus infections  Pt voices understanding Pt states he is not ready to quit

## 2018-05-30 NOTE — Assessment & Plan Note (Signed)
Lab Results  Component Value Date   HGBA1C 10.0 (H) 05/29/2018   Noncompliant with medications Extensively made plan today for timing of medication doses and use of cell phone alarm Diet/exercise enc Needs eye exam-he wants to schedule this himself

## 2018-05-31 LAB — GLUCOSE, CAPILLARY
Glucose-Capillary: 122 mg/dL — ABNORMAL HIGH (ref 70–99)
Glucose-Capillary: 79 mg/dL (ref 70–99)
Glucose-Capillary: 113 mg/dL — ABNORMAL HIGH (ref 70–99)
Glucose-Capillary: 120 mg/dL — ABNORMAL HIGH (ref 70–99)

## 2018-05-31 MED ORDER — OSELTAMIVIR PHOSPHATE 75 MG PO CAPS
75.0000 mg | ORAL_CAPSULE | Freq: Two times a day (BID) | ORAL | Status: DC
  Administered 2018-05-31 – 2018-06-01 (×3): 75 mg via ORAL
  Filled 2018-05-31 (×3): qty 1

## 2018-05-31 NOTE — Progress Notes (Signed)
Pt's IV painful. Removed IV per pt's request. Educated pt. Will try to get a new one during the day per pt.

## 2018-05-31 NOTE — Progress Notes (Addendum)
Midnight snack provided. Pt refusing to get new IV at this time.  Pt refused Blood draw at this time. Educated pt. Will try later.

## 2018-05-31 NOTE — Progress Notes (Signed)
   Patient Name: Zachary Lewis Date of Encounter: 05/31/2018, 10:00 AM    Subjective  Still coughing some Refused IV restart and blood draw   Objective  BP 111/61 (BP Location: Left Arm)   Pulse 90   Temp 99.5 F (37.5 C) (Oral)   Resp 18   Wt 91.5 kg   SpO2 94%   BMI 32.56 kg/m  NAD abd soft, palpable liver  Influenza A + swab    Assessment and Plan  Jaundice and hepatomegaly - alcoholic liver disease - other serologies ordered but not drawn - balance of evidence points to alcoholic hepatitis in my opinion Influenza A + Leukocytosis  No new recs - do not think GI/liver issues need to keep him here though elevated WBC concerning could be flu? - need to see a trend there  I asked him to allow IV restart and blood draws Droplet precautions ordered  Gatha Mayer, MD, [facility] 05/31/2018 10:00 AM Pager [phone]

## 2018-05-31 NOTE — Progress Notes (Signed)
Pt refused blood draw again at this time. Educated pt. Will notify MD.

## 2018-06-01 ENCOUNTER — Telehealth: Payer: Self-pay

## 2018-06-01 DIAGNOSIS — Z7289 Other problems related to lifestyle: Secondary | ICD-10-CM

## 2018-06-01 DIAGNOSIS — Z8719 Personal history of other diseases of the digestive system: Secondary | ICD-10-CM

## 2018-06-01 DIAGNOSIS — K709 Alcoholic liver disease, unspecified: Secondary | ICD-10-CM

## 2018-06-01 DIAGNOSIS — R17 Unspecified jaundice: Secondary | ICD-10-CM

## 2018-06-01 DIAGNOSIS — K7010 Alcoholic hepatitis without ascites: Secondary | ICD-10-CM

## 2018-06-01 LAB — CBC WITH DIFFERENTIAL/PLATELET
Band Neutrophils: 1 %
Basophils Absolute: 0 10*3/uL (ref 0.0–0.1)
Basophils Relative: 0 %
Eosinophils Absolute: 0.8 10*3/uL — ABNORMAL HIGH (ref 0.0–0.5)
Eosinophils Relative: 4 %
HCT: 33.3 % — ABNORMAL LOW (ref 39.0–52.0)
HEMOGLOBIN: 10.7 g/dL — AB (ref 13.0–17.0)
LYMPHS ABS: 0.8 10*3/uL (ref 0.7–4.0)
Lymphocytes Relative: 4 %
MCH: 35.8 pg — ABNORMAL HIGH (ref 26.0–34.0)
MCHC: 32.1 g/dL (ref 30.0–36.0)
MCV: 111.4 fL — ABNORMAL HIGH (ref 80.0–100.0)
Monocytes Absolute: 0.8 10*3/uL (ref 0.1–1.0)
Monocytes Relative: 4 %
Neutro Abs: 16.7 10*3/uL — ABNORMAL HIGH (ref 1.7–7.7)
Neutrophils Relative %: 86 %
PROMYELOCYTES RELATIVE: 1 %
Platelets: 216 10*3/uL (ref 150–400)
RBC: 2.99 MIL/uL — ABNORMAL LOW (ref 4.22–5.81)
RDW: 15.2 % (ref 11.5–15.5)
WBC: 19.2 10*3/uL — ABNORMAL HIGH (ref 4.0–10.5)
nRBC: 0 % (ref 0.0–0.2)
nRBC: 0 /100 WBC

## 2018-06-01 LAB — GLUCOSE, CAPILLARY: Glucose-Capillary: 136 mg/dL — ABNORMAL HIGH (ref 70–99)

## 2018-06-01 LAB — COMPREHENSIVE METABOLIC PANEL
ALBUMIN: 1.8 g/dL — AB (ref 3.5–5.0)
ALT: 55 U/L — ABNORMAL HIGH (ref 0–44)
AST: 141 U/L — ABNORMAL HIGH (ref 15–41)
Alkaline Phosphatase: 658 U/L — ABNORMAL HIGH (ref 38–126)
Anion gap: 12 (ref 5–15)
BUN: 12 mg/dL (ref 6–20)
CO2: 26 mmol/L (ref 22–32)
Calcium: 8 mg/dL — ABNORMAL LOW (ref 8.9–10.3)
Chloride: 94 mmol/L — ABNORMAL LOW (ref 98–111)
Creatinine, Ser: 0.7 mg/dL (ref 0.61–1.24)
GFR calc Af Amer: >60 mL/min (ref >60)
GFR calc non Af Amer: >60 mL/min (ref >60)
GLUCOSE: 123 mg/dL — AB (ref 70–99)
Potassium: 3.1 mmol/L — ABNORMAL LOW (ref 3.5–5.1)
SODIUM: 132 mmol/L — AB (ref 135–145)
Total Bilirubin: 8.6 mg/dL — ABNORMAL HIGH (ref 0.3–1.2)
Total Protein: 5.8 g/dL — ABNORMAL LOW (ref 6.5–8.1)

## 2018-06-01 LAB — IRON AND TIBC
Iron: 76 ug/dL (ref 45–182)
Saturation Ratios: 43 % — ABNORMAL HIGH (ref 17.9–39.5)
TIBC: 178 ug/dL — ABNORMAL LOW (ref 250–450)
UIBC: 102 ug/dL

## 2018-06-01 LAB — ACUTE HEP PANEL AND HEP B SURFACE AB
HEPATITIS C ANTIBODY REFILL$(REFL): NONREACTIVE
Hep A IgM: NONREACTIVE
Hep B C IgM: NONREACTIVE
Hepatitis B Surface Ag: NONREACTIVE
SIGNAL TO CUT-OFF: 0.03 (ref <1.00)

## 2018-06-01 LAB — FERRITIN: Ferritin: 1432 ng/mL — ABNORMAL HIGH (ref 24–336)

## 2018-06-01 LAB — MAGNESIUM: Magnesium: 2.1 mg/dL (ref 1.7–2.4)

## 2018-06-01 LAB — REFLEX TIQ

## 2018-06-01 MED ORDER — POTASSIUM CHLORIDE CRYS ER 20 MEQ PO TBCR
20.0000 meq | EXTENDED_RELEASE_TABLET | Freq: Once | ORAL | Status: AC
  Administered 2018-06-01: 20 meq via ORAL
  Filled 2018-06-01: qty 1

## 2018-06-01 MED ORDER — THIAMINE HCL 100 MG PO TABS: 100.0000 mg | ORAL_TABLET | Freq: Every day | ORAL | 0 refills | Status: DC

## 2018-06-01 MED ORDER — FOLIC ACID 1 MG PO TABS: 1.0000 mg | ORAL_TABLET | Freq: Every day | ORAL | 0 refills | Status: DC

## 2018-06-01 MED ORDER — OSELTAMIVIR PHOSPHATE 75 MG PO CAPS: 75.0000 mg | ORAL_CAPSULE | Freq: Every day | ORAL | 0 refills | Status: DC

## 2018-06-01 MED ORDER — POTASSIUM CHLORIDE CRYS ER 20 MEQ PO TBCR
40.0000 meq | EXTENDED_RELEASE_TABLET | Freq: Once | ORAL | Status: AC
  Administered 2018-06-01: 40 meq via ORAL
  Filled 2018-06-01: qty 2

## 2018-06-01 NOTE — Telephone Encounter (Signed)
-----   Message from Yetta Flock, MD sent at 06/01/2018 10:43 AM EST ----- Regarding: follow up Barbera Setters this is one of Dr. Celesta Aver patients to be discharged from the hospital today.  I would like him to go to our office for LFTs and CBC later this week if you can place orders. He is aware. Can you also get him in the office with APP or Dr. Carlean Purl within 3 weeks or so. Thanks Richardson Landry

## 2018-06-01 NOTE — Discharge Instructions (Signed)
Follow with Primary MD Tower, Wynelle Fanny, MD in 7 days   Get CBC, CMP, 2 view Chest X ray -  checked  by Primary MD in 5-7 days    Activity: As tolerated with Full fall precautions use walker/cane & assistance as needed  Disposition Home    Diet: Heart Health Low Carb - check CBGs QAC-HS  Special Instructions: If you have smoked or chewed Tobacco  in the last 2 yrs please stop smoking, stop any regular Alcohol  and or any Recreational drug use.  On your next visit with your primary care physician please Get Medicines reviewed and adjusted.  Please request your Prim.MD to go over all Hospital Tests and Procedure/Radiological results at the follow up, please get all Hospital records sent to your Prim MD by signing hospital release before you go home.  If you experience worsening of your admission symptoms, develop shortness of breath, life threatening emergency, suicidal or homicidal thoughts you must seek medical attention immediately by calling 911 or calling your MD immediately  if symptoms less severe.  You Must read complete instructions/literature along with all the possible adverse reactions/side effects for all the Medicines you take and that have been prescribed to you. Take any new Medicines after you have completely understood and accpet all the possible adverse reactions/side effects.

## 2018-06-01 NOTE — Progress Notes (Signed)
Inpatient Diabetes Program Recommendations  AACE/ADA: New Consensus Statement on Inpatient Glycemic Control (2015)  Target Ranges:  Prepandial:   less than 140 mg/dL      Peak postprandial:   less than 180 mg/dL (1-2 hours)      Critically ill patients:  140 - 180 mg/dL   Lab Results  Component Value Date   GLUCAP 136 (H) 06/01/2018   HGBA1C 10.0 (H) 05/29/2018    Review of Glycemic Control Results for Zachary Lewis, Zachary Lewis (MRN [id number]) as of 06/01/2018 12:12  Ref. Range 05/31/2018 12:12 05/31/2018 17:05 05/31/2018 21:38 06/01/2018 08:23  Glucose-Capillary Latest Ref Range: 70 - 99 mg/dL 79 113 (H) 120 (H) 136 (H)   Spoke with patient regarding outpatient management of DM. Confirms taking medications as prescribed.  Reviewed patient's current A1c of 10%. Explained what a A1c is and what it measures. Also reviewed goal A1c with patient, importance of good glucose control @ home, and blood sugar goals. Reviewed patho of DM, role of pancreas, current inpatient insulin needs, current trends, impact of infection/stress to CBGs, vascular changes and comorbidites. Patient denies checking CBGs at home and denies having a meter. Patient will need a meter at discharge. MD paged. Blood glucose kit (includes lancets and strips) ([id number]). Encouraged to begin checking 2-3 times per day, writing values down and reviewed when to contact MD. Reviewed lifestyle modifications and increasing activity levels once he begins to feel better. Patient plans to follow up with PCP and outpatient referral was placed. No further questions at this time.   Thanks, Bronson Curb, MSN, RNC-OB Diabetes Coordinator [phone] (8a-5p)

## 2018-06-01 NOTE — Plan of Care (Signed)
  Problem: Clinical Measurements: Goal: Ability to maintain clinical measurements within normal limits will improve Outcome: Progressing   Problem: Clinical Measurements: Goal: Respiratory complications will improve Outcome: Progressing   

## 2018-06-01 NOTE — Progress Notes (Signed)
Progress Note   Subjective  Patient states he is feeling better. No fevers overnight. Labs better today.   Objective   Vital signs in last 24 hours: Temp:  [98.1 F (36.7 C)-99.7 F (37.6 C)] 98.2 F (36.8 C) (01/13 0511) Pulse Rate:  [76-94] 76 (01/13 0511) Resp:  [17-19] 17 (01/13 0511) BP: (88-113)/(58-77) 107/72 (01/13 0835) SpO2:  [90 %-95 %] 95 % (01/13 0511) Weight:  [92.5 kg] 92.5 kg (01/13 0500) Last BM Date: 05/29/18 General:    AA male in NAD, jaundiced Heart:  Regular rate and rhythm; no murmurs Lungs: Respirations even and unlabored, lungs CTA bilaterally Abdomen:  Soft, nondistended.  Extremities:  Without edema. Neurologic:  Alert and oriented,  grossly normal neurologically. Psych:  Cooperative. Normal mood and affect.  Intake/Output from previous day: 01/12 0701 - 01/13 0700 In: 600 [P.O.:600] Out: -  Intake/Output this shift: No intake/output data recorded.  Lab Results: Recent Labs    05/29/18 2109 05/30/18 0535 06/01/18 0427  WBC 19.9* 25.9* 19.2*  HGB 11.6* 11.2* 10.7*  HCT 35.3* 34.6* 33.3*  PLT 312 296 216   BMET Recent Labs    05/29/18 2109 05/30/18 0535 06/01/18 0427  NA  --  134* 132*  K  --  2.5* 3.1*  CL  --  93* 94*  CO2  --  28 26  GLUCOSE  --  205* 123*  BUN  --  20 12  CREATININE 0.99 1.31* 0.70  CALCIUM  --  8.3* 8.0*   LFT Recent Labs    06/01/18 0427  PROT 5.8*  ALBUMIN 1.8*  AST 141*  ALT 55*  ALKPHOS 658*  BILITOT 8.6*   PT/INR Recent Labs    05/30/18 0535  LABPROT 14.3  INR 1.12    Studies/Results: Nm Hepato W/eject Fract  Result Date: 05/30/2018 CLINICAL DATA:  Inpatient.  Right upper quadrant abdominal pain. EXAM: NUCLEAR MEDICINE HEPATOBILIARY IMAGING WITH GALLBLADDER EF TECHNIQUE: Sequential images of the abdomen were obtained out to 60 minutes following intravenous administration of radiopharmaceutical. After oral ingestion of Ensure, gallbladder ejection fraction was determined. At 60  min, normal ejection fraction is greater than 33%. RADIOPHARMACEUTICALS:  7.5 mCi Tc-14m Choletec IV COMPARISON:  05/29/2018 MRI abdomen and abdominal sonogram. FINDINGS: Prompt uptake and biliary excretion of activity by the liver is seen. Gallbladder activity is visualized, consistent with patency of cystic duct. Biliary activity passes into small bowel, consistent with patent common bile duct. Calculated gallbladder ejection fraction is 43%. (Normal gallbladder ejection fraction with Ensure is greater than 33%.) IMPRESSION: Normal hepatobiliary scintigraphy study. Patent cystic duct, which is not compatible with acute cholecystitis. Patent common bile duct. Normal gallbladder ejection fraction. Electronically Signed   By: JIlona SorrelM.D.   On: 05/30/2018 11:47       Assessment / Plan:    58y/o male presented to the hospital with jaundiced, also incidentally noted to have influenza. AST > ALT elevation with rise in bili and elevated WBC. Clinical history most c/w alcoholic hepatitis, although he could have underlying cirrhosis as well. He does not have cholecystitis  Discussed ddx with the patient and his wife. Counseled him on the importance of complete alcohol abstinence moving forward. His DF is low, he does not warrant steroids. Overall labs improved and WBC coming down. I think he is stable to go home today but would appreciate labs done to ensure no cause for chronic liver diseases otherwise before he leaves.   Recommend: -  labs to rule out viral hepatitis, hemochromatosis, AIH, alpha one antitrypsin, will obtain before he leaves today - complete alcohol abstinence moving forward, he agreed - LFTs to be drawn at our office later this week to ensure stable - he will follow up with our clinic in upcoming weeks for reassessment and further management  Call with questions.   [place] Cellar, MD [facility]

## 2018-06-01 NOTE — Discharge Summary (Signed)
Zachary Lewis:[id number] DOB: 1960-10-08 DOA: 05/29/2018  PCP: Abner Greenspan, MD  Admit date: 05/29/2018  Discharge date: 06/01/2018  Admitted From: Home   Disposition:  Home   Recommendations for Outpatient Follow-up:   Follow up with PCP in 1-2 weeks  PCP Please obtain BMP/CBC, 2 view CXR in 1week,  (see Discharge instructions)   PCP Please follow up on the following pending results: Monitor LFTs   Home Health: None   Equipment/Devices: None  Consultations: GI Discharge Condition: Stable   CODE STATUS: Fill   Diet Recommendation: Heart Healthy Low Carb    Chief Complaint  Patient presents with  . Weakness     Brief history of present illness from the day of admission and additional interim summary    AEDON Lewis is a 58 y.o. male with medical history significant of uncontrolled diabetes mellitus type 2, active tobacco use, hyperlipidemia came to the hospital for evaluation of abnormal LFTs.  He was sent by his primary care provider.                                                                 Hospital Course    1.  Clinical jaundice with elevated LFTs.  This is due to alcoholic hepatitis and cirrhosis.  Unremarkable nonacute work-up which included right upper quadrant ultrasound, HIDA scan and MRI liver.  Seen by GI.  Acute serology so far unremarkable, patient overall much improved, counseled to quit alcohol.  No signs of DTs.  Will be discharged home with PCP and GI follow-up outpatient.  This is a nonacute issue.  2.  DM type II, dyslipidemia.  Continue home medications unchanged.  3.  Question of calculus acute cholecystitis.  Ruled out by HIDA.  No right upper quadrant tenderness.  4.  Alcohol abuse and smoking.  Counseled to quit both, no signs of withdrawal.  Placed on folic acid  thiamine.   Discharge diagnosis     Principal Problem:   Acute cholecystitis Active Problems:   Diabetes type 2, uncontrolled (HCC)   Hyperlipidemia   Obesity   HYPERTENSION, BENIGN ESSENTIAL   Smoking   Transaminitis   Alcohol use   Alcoholic liver disease ([place])   Jaundice   Leukocytosis    Discharge instructions    Discharge Instructions    Discharge instructions   Complete by:  As directed    Follow with Primary MD Tower, Zachary Fanny, MD in 7 days   Get CBC, CMP, 2 view Chest X ray -  checked  by Primary MD in 5-7 days    Activity: As tolerated with Full fall precautions use walker/cane & assistance as needed  Disposition Home    Diet: Heart Health Low Carb - check CBGs QAC-HS  Special Instructions: If you have smoked or  chewed Tobacco  in the last 2 yrs please stop smoking, stop any regular Alcohol  and or any Recreational drug use.  On your next visit with your primary care physician please Get Medicines reviewed and adjusted.  Please request your Prim.MD to go over all Hospital Tests and Procedure/Radiological results at the follow up, please get all Hospital records sent to your Prim MD by signing hospital release before you go home.  If you experience worsening of your admission symptoms, develop shortness of breath, life threatening emergency, suicidal or homicidal thoughts you must seek medical attention immediately by calling 911 or calling your MD immediately  if symptoms less severe.  You Must read complete instructions/literature along with all the possible adverse reactions/side effects for all the Medicines you take and that have been prescribed to you. Take any new Medicines after you have completely understood and accpet all the possible adverse reactions/side effects.   Increase activity slowly   Complete by:  As directed       Discharge Medications   Allergies as of 06/01/2018   No Known Allergies     Medication List    TAKE these medications    blood glucose meter kit and supplies To check glucose once daily and as needed for diabetes   folic acid 1 MG tablet Commonly known as:  FOLVITE Take 1 tablet (1 mg total) by mouth daily. Start taking on:  June 02, 2018   glipiZIDE 10 MG 24 hr tablet Commonly known as:  GLUCOTROL XL TAKE 1 TABLET(10 MG) BY MOUTH DAILY What changed:    how much to take  how to take this  when to take this  additional instructions   Lancets Misc To check glucose once daily and as needed for diabetes   lisinopril-hydrochlorothiazide 20-25 MG tablet Commonly known as:  PRINZIDE,ZESTORETIC Take 1 tablet by mouth daily.   metFORMIN 500 MG tablet Commonly known as:  GLUCOPHAGE TAKE 1 TABLET(500 MG) BY MOUTH TWICE DAILY WITH A MEAL What changed:    how much to take  how to take this  when to take this  additional instructions   oseltamivir 75 MG capsule Commonly known as:  TAMIFLU Take 1 capsule (75 mg total) by mouth daily.   simvastatin 20 MG tablet Commonly known as:  ZOCOR TAKE 1 TABLET(20 MG) BY MOUTH AT BEDTIME What changed:    how much to take  how to take this  when to take this  additional instructions   thiamine 100 MG tablet Take 1 tablet (100 mg total) by mouth daily. Start taking on:  June 02, 2018       Follow-up Information    Tower, Zachary Fanny, MD. Schedule an appointment as soon as possible for a visit in 1 week(s).   Specialties:  Family Medicine, Radiology Contact information: [address] [place] [address] [phone]        Zachary Mayer, MD. Schedule an appointment as soon as possible for a visit in 2 week(s).   Specialty:  Gastroenterology Contact information: [address] [place] [address] [phone]           Major procedures and Radiology Reports - PLEASE review detailed and final reports thoroughly  -       Mr Abdomen Mrcp Wo Contrast  Result Date: 05/29/2018 CLINICAL DATA:  Abnormal liver  function tests.  Transaminitis. EXAM: MRI ABDOMEN WITHOUT CONTRAST TECHNIQUE: Multiplanar multisequence MR imaging was performed without the administration of intravenous  contrast. COMPARISON:  None. FINDINGS: Exam was suboptimal as patient had coughing and erratic breathing throughout the exam, and could not lay still. Several attempts were made, but patient was unable to tolerate all imaging sequences being performed. Several sequences which were obtained show the following: Lower chest: No acute findings. Hepatobiliary: Hepatic cirrhosis is demonstrated. No liver masses are identified on this unenhanced exam. The gallbladder shows diffuse wall thickening but is not distended, and this is likely secondary to hepatic cirrhosis. No evidence of biliary ductal dilatation. Pancreas: No mass or inflammatory process visualized on this unenhanced exam. Spleen:  Within normal limits in size. Adrenals/Urinary tract: Unremarkable. No evidence of hydronephrosis. Stomach/Bowel: Visualized portion unremarkable. Vascular/Lymphatic: No pathologically enlarged lymph nodes identified. No evidence of abdominal aortic aneurysm. Other:  Minimal perihepatic ascites. Musculoskeletal:  No suspicious bone lesions identified. IMPRESSION: Technically suboptimal exam as described above. Hepatic cirrhosis and minimal ascites. Small gallbladder with diffuse wall thickening, likely secondary to cirrhosis. No evidence of biliary ductal dilatation. Electronically Signed   By: John  Stahl M.D.   On: 05/29/2018 21:02   Nm Hepato W/eject Fract  Result Date: 05/30/2018 CLINICAL DATA:  Inpatient.  Right upper quadrant abdominal pain. EXAM: NUCLEAR MEDICINE HEPATOBILIARY IMAGING WITH GALLBLADDER EF TECHNIQUE: Sequential images of the abdomen were obtained out to 60 minutes following intravenous administration of radiopharmaceutical. After oral ingestion of Ensure, gallbladder ejection fraction was determined. At 60 min, normal ejection fraction is  greater than 33%. RADIOPHARMACEUTICALS:  7.5 mCi Tc-99m  Choletec IV COMPARISON:  05/29/2018 MRI abdomen and abdominal sonogram. FINDINGS: Prompt uptake and biliary excretion of activity by the liver is seen. Gallbladder activity is visualized, consistent with patency of cystic duct. Biliary activity passes into small bowel, consistent with patent common bile duct. Calculated gallbladder ejection fraction is 43%. (Normal gallbladder ejection fraction with Ensure is greater than 33%.) IMPRESSION: Normal hepatobiliary scintigraphy study. Patent cystic duct, which is not compatible with acute cholecystitis. Patent common bile duct. Normal gallbladder ejection fraction. Electronically Signed   By: Jason A Poff M.D.   On: 05/30/2018 11:47   Us Abdomen Limited  Result Date: 05/29/2018 CLINICAL DATA:  Abnormal liver function tests EXAM: ULTRASOUND ABDOMEN LIMITED RIGHT UPPER QUADRANT COMPARISON:  None. FINDINGS: Gallbladder: There are no gallstones. The gallbladder wall is thickened with hypoechoic signal centrally. It measures up to 6 mm. Pericholecystic fluid is also present. Negative sonographic Murphy sign. Common bile duct: Diameter: 2 mm in caliber. Liver: The liver is diffusely increased in echogenicity with heterogeneity of the liver echotexture. There is focal hypoechoic signal adjacent to the gallbladder with a geographic border. Portal vein is patent on color Doppler imaging with normal direction of blood flow towards the liver. IMPRESSION: There is gallbladder wall thickening, gallbladder wall edema, and pericholecystic fluid. There is no Murphy sign or gallstones. These findings are nonspecific. Correlate clinically as for the need for further imaging. An inflammatory process is not excluded. The liver is diffusely heterogeneous and increased in echogenicity. Findings may represent diffuse hepatic steatosis. Diffuse hepatic parenchymal disease can have a similar appearance. Hypoechoic signal adjacent to  the gallbladder is favored represent relative sparing of fatty infiltration. Electronically Signed   By: Arthur  Hoss M.D.   On: 05/29/2018 16:24    Micro Results     No results found for this or any previous visit (from the past 240 hour(s)).  Today   Subjective    Rockey Aung today has no headache,no chest abdominal pain,no new weakness tingling or numbness, feels   much better wants to go home today.     Objective   Blood pressure 107/72, pulse 76, temperature 98.2 F (36.8 C), temperature source Oral, resp. rate 17, weight 92.5 kg, SpO2 95 %.   Intake/Output Summary (Last 24 hours) at 06/01/2018 0913 Last data filed at 05/31/2018 1330 Gross per 24 hour  Intake 600 ml  Output -  Net 600 ml    Exam Awake Alert, Oriented x 3, No new F.N deficits, Normal affect [place].AT,PERRAL Supple Neck,No JVD, No cervical lymphadenopathy appriciated.  Symmetrical Chest wall movement, Good air movement bilaterally, CTAB RRR,No Gallops,Rubs or new Murmurs, No Parasternal Heave +ve B.Sounds, Abd Soft, Non tender, No organomegaly appriciated, No rebound -guarding or rigidity. No Cyanosis, Clubbing or edema, No new Rash or bruise   Data Review   CBC w Diff:  Lab Results  Component Value Date   WBC 19.2 (H) 06/01/2018   HGB 10.7 (L) 06/01/2018   HCT 33.3 (L) 06/01/2018   PLT 216 06/01/2018   LYMPHOPCT 4 06/01/2018   BANDSPCT 1 06/01/2018   MONOPCT 4 06/01/2018   EOSPCT 4 06/01/2018   BASOPCT 0 06/01/2018    CMP:  Lab Results  Component Value Date   NA 132 (L) 06/01/2018   K 3.1 (L) 06/01/2018   CL 94 (L) 06/01/2018   CO2 26 06/01/2018   BUN 12 06/01/2018   CREATININE 0.70 06/01/2018   PROT 5.8 (L) 06/01/2018   ALBUMIN 1.8 (L) 06/01/2018   BILITOT 8.6 (H) 06/01/2018   ALKPHOS 658 (H) 06/01/2018   AST 141 (H) 06/01/2018   ALT 55 (H) 06/01/2018  .   Total Time in preparing paper work, data evaluation and todays exam - 54 minutes  Lala Lund M.D on 06/01/2018 at 9:13  AM  [facility]   Office  [phone]

## 2018-06-01 NOTE — Progress Notes (Signed)
[name] to be D/C'd  per MD order. Discussed with the patient and all questions fully answered.  VSS, Skin clean, dry and intact without evidence of skin break down, no evidence of skin tears noted.  IV catheter discontinued intact. Site without signs and symptoms of complications. Dressing and pressure applied.  An After Visit Summary was printed and given to the patient. Patient received prescription.  D/c education completed with patient/family including follow up instructions, medication list, d/c activities limitations if indicated, with other d/c instructions as indicated by MD - patient able to verbalize understanding, all questions fully answered.   Patient instructed to return to ED, call 911, or call MD for any changes in condition.   Patient to be escorted via [place], and D/C home via private auto.

## 2018-06-01 NOTE — Telephone Encounter (Signed)
He is scheduled for follow up with Dr. Carlean Purl for 07/02/18 10:00

## 2018-06-02 ENCOUNTER — Telehealth: Payer: Self-pay | Admitting: *Deleted

## 2018-06-02 ENCOUNTER — Telehealth: Payer: Self-pay | Admitting: Family Medicine

## 2018-06-02 LAB — HCV RNA QUANT: HCV Quantitative: NOT DETECTED IU/mL (ref >50)

## 2018-06-02 LAB — ANA W/REFLEX IF POSITIVE: Anti Nuclear Antibody(ANA): NEGATIVE

## 2018-06-02 LAB — ALPHA-1-ANTITRYPSIN: A1 ANTITRYPSIN SER: 198 mg/dL — AB (ref 101–187)

## 2018-06-02 LAB — IGG: IgG (Immunoglobin G), Serum: 1158 mg/dL (ref 700–1600)

## 2018-06-02 LAB — ANTI-SMOOTH MUSCLE ANTIBODY, IGG: F-Actin IgG: 10 Units (ref 0–19)

## 2018-06-02 NOTE — Telephone Encounter (Signed)
Pt was discharged from hospital yesterday and need a note to go back to work 1.17.20. please advise pt.

## 2018-06-02 NOTE — Telephone Encounter (Signed)
Patient requested an earlier appointment if Dr. Glori Bickers can work him in so that he can go back to work Friday.

## 2018-06-02 NOTE — Telephone Encounter (Signed)
Transition Care Management Follow-up Telephone Call   Date discharged? 06/01/2018   How have you been since you were released from the hospital?" Doing fine feels a lot better"   Do you understand why you were in the hospital? YES   Do you understand the discharge instructions? YES   Where were you discharged to? HOME   Items Reviewed:  Medications reviewed: YES, was told to stop cholesterol medication  Allergies reviewed: YES  Dietary changes reviewed: YES  Referrals reviewed: YES   Functional Questionnaire:   Activities of Daily Living (ADLs):   He states they are independent in the following: patient stated that he is totally independent and can do everything for himself States they require assistance with the following: Nothing   Any transportation issues/concerns?: NO   Any patient concerns? NO   Confirmed importance and date/time of follow-up visits scheduled  YES Provider Appointment booked with Dr. Glori Bickers 06/09/18 at 4:15.  Confirmed with patient if condition begins to worsen call PCP or go to the ER.  Patient was given the office number and encouraged to call back with question or concerns.  :  YES

## 2018-06-03 NOTE — Telephone Encounter (Signed)
Pt called office needing to be seen so that he can be released back to work by 06/05/18. I scheduled pt for Friday 06/05/18 with Dr.Duncan since pt's PCP is out sick. Please see previous notes.

## 2018-06-03 NOTE — Telephone Encounter (Signed)
Routing to [place] to see if another provider has an opening since Dr. Glori Bickers is out with the flu

## 2018-06-03 NOTE — Telephone Encounter (Signed)
I will be glad to see him.  If he is well enough to return to work then I can sign off on that.  I cannot make any promises about his status until I see him.  Thanks.

## 2018-06-03 NOTE — Telephone Encounter (Signed)
I have the flu and will be back Friday at the earliest at best - sorry about that

## 2018-06-04 NOTE — Telephone Encounter (Signed)
Left detailed message on voicemail.  

## 2018-06-05 ENCOUNTER — Ambulatory Visit (INDEPENDENT_AMBULATORY_CARE_PROVIDER_SITE_OTHER): Payer: Managed Care, Other (non HMO) | Admitting: Family Medicine

## 2018-06-05 ENCOUNTER — Other Ambulatory Visit: Payer: Managed Care, Other (non HMO)

## 2018-06-05 ENCOUNTER — Encounter: Payer: Self-pay | Admitting: Family Medicine

## 2018-06-05 VITALS — BP 110/66 | HR 85 | Temp 99.0°F | Ht 66.0 in | Wt 198.8 lb

## 2018-06-05 DIAGNOSIS — Z20828 Contact with and (suspected) exposure to other viral communicable diseases: Secondary | ICD-10-CM | POA: Diagnosis not present

## 2018-06-05 DIAGNOSIS — R17 Unspecified jaundice: Secondary | ICD-10-CM

## 2018-06-05 DIAGNOSIS — E1165 Type 2 diabetes mellitus with hyperglycemia: Secondary | ICD-10-CM

## 2018-06-05 DIAGNOSIS — K709 Alcoholic liver disease, unspecified: Secondary | ICD-10-CM

## 2018-06-05 LAB — COMPREHENSIVE METABOLIC PANEL
ALT: 59 U/L — ABNORMAL HIGH (ref 0–53)
AST: 111 U/L — AB (ref 0–37)
Albumin: 2.8 g/dL — ABNORMAL LOW (ref 3.5–5.2)
Alkaline Phosphatase: 874 U/L — ABNORMAL HIGH (ref 39–117)
BUN: 15 mg/dL (ref 6–23)
CO2: 32 mEq/L (ref 19–32)
Calcium: 8.9 mg/dL (ref 8.4–10.5)
Chloride: 93 mEq/L — ABNORMAL LOW (ref 96–112)
Creatinine, Ser: 0.65 mg/dL (ref 0.40–1.50)
GFR: 152.26 mL/min (ref >60.00)
Glucose, Bld: 134 mg/dL — ABNORMAL HIGH (ref 70–99)
Potassium: 3.1 mEq/L — ABNORMAL LOW (ref 3.5–5.1)
Sodium: 134 mEq/L — ABNORMAL LOW (ref 135–145)
Total Bilirubin: 10.7 mg/dL — ABNORMAL HIGH (ref 0.2–1.2)
Total Protein: 7 g/dL (ref 6.0–8.3)

## 2018-06-05 LAB — CBC WITH DIFFERENTIAL/PLATELET
Absolute Monocytes: 837 cells/uL (ref 200–950)
Basophils Absolute: 107 cells/uL (ref 0–200)
Basophils Relative: 0.6 %
Eosinophils Absolute: 320 cells/uL (ref 15–500)
Eosinophils Relative: 1.8 %
HCT: 33.9 % — ABNORMAL LOW (ref 38.5–50.0)
Hemoglobin: 12.5 g/dL — ABNORMAL LOW (ref 13.2–17.1)
Lymphs Abs: 2136 cells/uL (ref 850–3900)
MCH: 37.2 pg — ABNORMAL HIGH (ref 27.0–33.0)
MCHC: 36.9 g/dL — ABNORMAL HIGH (ref 32.0–36.0)
MCV: 100.9 fL — ABNORMAL HIGH (ref 80.0–100.0)
MPV: 12.3 fL (ref 7.5–12.5)
Monocytes Relative: 4.7 %
Neutro Abs: 14400 cells/uL — ABNORMAL HIGH (ref 1500–7800)
Neutrophils Relative %: 80.9 %
PLATELETS: 327 10*3/uL (ref 140–400)
RBC: 3.36 10*6/uL — ABNORMAL LOW (ref 4.20–5.80)
RDW: 13.8 % (ref 11.0–15.0)
Total Lymphocyte: 12 %
WBC: 17.8 10*3/uL — ABNORMAL HIGH (ref 3.8–10.8)

## 2018-06-05 MED ORDER — SIMVASTATIN 20 MG PO TABS: ORAL_TABLET | ORAL | Status: DC

## 2018-06-05 MED ORDER — GLIPIZIDE ER 10 MG PO TB24: 10.0000 mg | ORAL_TABLET | Freq: Every day | ORAL | Status: DC

## 2018-06-05 MED ORDER — METFORMIN HCL 500 MG PO TABS: 500.0000 mg | ORAL_TABLET | Freq: Two times a day (BID) | ORAL | Status: DC

## 2018-06-05 NOTE — Progress Notes (Signed)
Agree 

## 2018-06-05 NOTE — Patient Instructions (Signed)
Stay off simvastatin for now.  Go to the lab on the way out.  We'll contact you with your lab report. Thanks for your effort.  Please let us know if you need help staying off alcohol.  Back to work Monday.  Update [place] as needed.  Check with your insurance to see which diabetic meter is covered.  Take care.  Glad to see you.

## 2018-06-05 NOTE — Progress Notes (Signed)
Admit date: 05/29/2018  Discharge date: 06/01/2018  Admitted From: Home   Disposition:  Home   Recommendations for Outpatient Follow-up:   Follow up with PCP in 1-2 weeks  PCP Please obtain BMP/CBC, 2 view CXR in 1week,  (see Discharge instructions)   PCP Please follow up on the following pending results: Monitor LFTs   Home Health: None   Equipment/Devices: None  Consultations: GI Discharge Condition: Stable   CODE STATUS: Fill   Diet Recommendation: Heart Healthy Low Carb       Chief Complaint  Patient presents with  . Weakness     Brief history of present illness from the day of admission and additional interim summary    Zachary T Burrisis a 58 y.o.malewith medical history significant ofuncontrolled diabetes mellitus type 2, active tobacco use, hyperlipidemia came to the hospital for evaluation of abnormal LFTs. He was sent by his primary care provider.                                                                 Hospital Course   1.  Clinical jaundice with elevated LFTs.  This is due to alcoholic hepatitis and cirrhosis.  Unremarkable nonacute work-up which included right upper quadrant ultrasound, HIDA scan and MRI liver.  Seen by GI.  Acute serology so far unremarkable, patient overall much improved, counseled to quit alcohol.  No signs of DTs.  Will be discharged home with PCP and GI follow-up outpatient.  This is a nonacute issue.  2.  DM type II, dyslipidemia.  Continue home medications unchanged.  3.  Question of calculus acute cholecystitis.  Ruled out by HIDA.  No right upper quadrant tenderness.  4.  Alcohol abuse and smoking.  Counseled to quit both, no signs of withdrawal.  Placed on folic acid thiamine.  ==================================== Inpatient course discussed with patient.  He had liver test elevation and scleral icterus.  Imaging studies discussed with patient.  No obstructive pathology found.  Rationale for imaging  discussed.  He admits to drinking alcohol.  He has since quit in the meantime.  "I just stopped."  No shakes, no w/d sx, no DTs now or prev.    In the meantime he has less abd soreness.  No vomiting.  No fevers.  No diarrhea.  Yellowing of eyes is better but not resolved.  Previous imaging d/w pt.    He was incidentally diagnosed with flu, done with tamiflu.  He feels better.  No fevers  Diabetes.  A1c 10 prev, d/w pt. discussed that alcohol was affecting his sugar and his liver.  Unfortunately the diabetes is likely also affecting his liver.   Discussed pathophysiology of liver disease and jaundice and bilirubin elevation, etc.  PMH and SH reviewed  ROS: Per HPI unless specifically indicated in ROS section  No fevers.  Meds, vitals, and allergies reviewed.   nad ncat Mild scleral icterus noted bilateral Mmm Neck supple, no LA rrr ctab abd not ttp, no rebound.  Normal bowel sounds 1+ BLE edema.  Skin well perfused Normal radial pulses bilaterally. Normal nailbed capillary refill.

## 2018-06-07 ENCOUNTER — Encounter: Payer: Self-pay | Admitting: Family Medicine

## 2018-06-07 NOTE — Assessment & Plan Note (Signed)
He admits to previous drinking, has quit drinking in the meantime per his report.  We talked about outpatient support groups etc.  He wanted to do this on his own.  Discussed.  Support offered.  Rationale of previous work-up and pathophysiology of liver disease discussed with patient.  He has follow-up with GI pending, see notes on recheck labs today.  At this point still okay for outpatient follow-up.  Routine cautions given to patient.

## 2018-06-07 NOTE — Assessment & Plan Note (Signed)
Previous flu positive.  Treated.  Done with Tamiflu.  Improved.

## 2018-06-07 NOTE — Assessment & Plan Note (Signed)
See above, needs follow-up with PCP.  Cutting back on alcohol should also help with his sugar.  Discussed.

## 2018-06-08 ENCOUNTER — Other Ambulatory Visit: Payer: Self-pay | Admitting: Family Medicine

## 2018-06-09 ENCOUNTER — Inpatient Hospital Stay: Payer: Managed Care, Other (non HMO) | Admitting: Family Medicine

## 2018-06-10 ENCOUNTER — Telehealth: Payer: Self-pay | Admitting: Family Medicine

## 2018-06-10 NOTE — Telephone Encounter (Signed)
[employer] need CPT code to determine if pt need prior auth for his  Blood glucose monitor.

## 2018-06-10 NOTE — Telephone Encounter (Signed)
A CPT code is a code for a service or procedure (like a type of visit or surgery)  That does not make sense to me  Do they actually mean an ICD code (to indicate the condition being treated) ? --such as diabetes

## 2018-06-11 NOTE — Telephone Encounter (Signed)
[employer] and they are not sure who called and why and they did advise that usually we need a ICD 10 code note a CPT code but since [place] didn't leave a direct contact # then there is no way to get back in touch with them and maybe they will call back later but nothing he could help me with now

## 2018-06-12 ENCOUNTER — Other Ambulatory Visit: Payer: Managed Care, Other (non HMO)

## 2018-06-12 NOTE — Telephone Encounter (Signed)
Patient saw Dr. Damita Dunnings.

## 2018-06-15 ENCOUNTER — Other Ambulatory Visit: Payer: Managed Care, Other (non HMO)

## 2018-06-17 ENCOUNTER — Telehealth: Payer: Self-pay | Admitting: Internal Medicine

## 2018-06-17 NOTE — Telephone Encounter (Signed)
Patient's daughter notified okay to take imodium PRN.  She is asked to call back for worsening symptoms.

## 2018-06-17 NOTE — Telephone Encounter (Signed)
Pt's wife Karna Christmas called to inform that pt has been having diarrhea since two days ago, she wants to know if he can take imodium.Marland Kitchen

## 2018-06-19 ENCOUNTER — Ambulatory Visit (INDEPENDENT_AMBULATORY_CARE_PROVIDER_SITE_OTHER): Payer: Managed Care, Other (non HMO) | Admitting: Family Medicine

## 2018-06-19 ENCOUNTER — Encounter: Payer: Self-pay | Admitting: Family Medicine

## 2018-06-19 ENCOUNTER — Telehealth: Payer: Self-pay | Admitting: Radiology

## 2018-06-19 VITALS — BP 102/58 | HR 76 | Temp 97.5°F | Ht 66.0 in

## 2018-06-19 DIAGNOSIS — R0602 Shortness of breath: Secondary | ICD-10-CM

## 2018-06-19 DIAGNOSIS — E1165 Type 2 diabetes mellitus with hyperglycemia: Secondary | ICD-10-CM

## 2018-06-19 DIAGNOSIS — R17 Unspecified jaundice: Secondary | ICD-10-CM | POA: Diagnosis not present

## 2018-06-19 LAB — COMPREHENSIVE METABOLIC PANEL
ALT: 34 U/L (ref 0–53)
AST: 84 U/L — ABNORMAL HIGH (ref 0–37)
Albumin: 2.6 g/dL — ABNORMAL LOW (ref 3.5–5.2)
Alkaline Phosphatase: 703 U/L — ABNORMAL HIGH (ref 39–117)
BUN: 28 mg/dL — ABNORMAL HIGH (ref 6–23)
CO2: 31 mEq/L (ref 19–32)
Calcium: 8.7 mg/dL (ref 8.4–10.5)
Chloride: 94 mEq/L — ABNORMAL LOW (ref 96–112)
Creatinine, Ser: 0.78 mg/dL (ref 0.40–1.50)
GFR: 123.35 mL/min (ref >60.00)
GLUCOSE: 136 mg/dL — AB (ref 70–99)
Potassium: 2.7 mEq/L — CL (ref 3.5–5.1)
Sodium: 135 mEq/L (ref 135–145)
Total Bilirubin: 9 mg/dL — ABNORMAL HIGH (ref 0.2–1.2)
Total Protein: 6.7 g/dL (ref 6.0–8.3)

## 2018-06-19 LAB — CBC WITH DIFFERENTIAL/PLATELET
Basophils Relative: 0.7 % (ref 0.0–3.0)
Eosinophils Relative: 1.7 % (ref 0.0–5.0)
HCT: 35.6 % — ABNORMAL LOW (ref 39.0–52.0)
Hemoglobin: 12.1 g/dL — ABNORMAL LOW (ref 13.0–17.0)
Lymphocytes Relative: 11.1 % — ABNORMAL LOW (ref 12.0–46.0)
MCHC: 34 g/dL (ref 30.0–36.0)
MCV: 109.6 fl — ABNORMAL HIGH (ref 78.0–100.0)
MONOS PCT: 6.9 % (ref 3.0–12.0)
Neutrophils Relative %: 79.6 % — ABNORMAL HIGH (ref 43.0–77.0)
Platelets: 358 10*3/uL (ref 150.0–400.0)
RBC: 3.25 Mil/uL — ABNORMAL LOW (ref 4.22–5.81)
RDW: 15.3 % (ref 11.5–15.5)
WBC: 19.3 10*3/uL (ref 4.0–10.5)

## 2018-06-19 LAB — BRAIN NATRIURETIC PEPTIDE: Pro B Natriuretic peptide (BNP): 386 pg/mL — ABNORMAL HIGH (ref 0.0–100.0)

## 2018-06-19 LAB — HEMOGLOBIN A1C: HEMOGLOBIN A1C: 6.2 % (ref 4.6–6.5)

## 2018-06-19 MED ORDER — SPIRONOLACTONE 25 MG PO TABS: 25.0000 mg | ORAL_TABLET | Freq: Every day | ORAL | 0 refills | Status: DC

## 2018-06-19 MED ORDER — POTASSIUM CHLORIDE CRYS ER 20 MEQ PO TBCR: 40.0000 meq | EXTENDED_RELEASE_TABLET | Freq: Once | ORAL | 0 refills | Status: DC

## 2018-06-19 NOTE — Telephone Encounter (Signed)
[facility] called critical results, WBC - 19.3, K+ - 2.7.Results given to Dr Damita Dunnings

## 2018-06-19 NOTE — Progress Notes (Signed)
BLE swelling.  Previous LFT elevation and hospitalization noted, discussed.  He is off etoh in the meantime.  D/w pt.  He has variable eye yellowing, today seemed to be less than prev.  He noted BLE swelling in the last week.   He isn't SOB as much as prev but still with some SOB.  He is better but not resolved.   No CP.  His clothes fit tighter.  Scales are out of order at the clinic so we cannot weigh him today.  Meds, vitals, and allergies reviewed.   ROS: Per HPI unless specifically indicated in ROS section   GEN: nad, alert and oriented HEENT: mucous membranes moist, scleral icterus noted bilaterally NECK: supple w/o LA CV: rrr. PULM: ctab, no inc wob ABD: soft, +bs EXT: 2 + BLE up to the B proximal shins.  SKIN: no acute rash No asterixis in the BUE

## 2018-06-19 NOTE — Telephone Encounter (Signed)
See lab notes

## 2018-06-19 NOTE — Patient Instructions (Signed)
Thank you for your effort.  Go to the lab on the way out.  We'll contact you with your lab report. We'll go from there.

## 2018-06-21 NOTE — Assessment & Plan Note (Addendum)
With previous known bilirubin elevation and LFT elevation noted.  He has GI follow-up pending.  He says that he is abstinent from alcohol.  Encouraged him to continue that.  Discussed options.  We need to recheck his labs today.  I wanted to hold off on treatment until I saw his labs.  He agreed.  If feeling worse in the meantime then I want him to go to the hospital.  Upon review of labs,K is lower. LFTs and WBC are similar to prev.  BNP elevation c/w his amount of fluid retention.  Would take 60mq potassium once and start taking spironolactone daily.  Needs recheck OV re: swelling and labs next week.   I did not yet start Lasix because of his potassium.

## 2018-06-24 ENCOUNTER — Ambulatory Visit (INDEPENDENT_AMBULATORY_CARE_PROVIDER_SITE_OTHER): Payer: Managed Care, Other (non HMO) | Admitting: Family Medicine

## 2018-06-24 ENCOUNTER — Encounter: Payer: Self-pay | Admitting: Family Medicine

## 2018-06-24 ENCOUNTER — Telehealth: Payer: Self-pay | Admitting: Radiology

## 2018-06-24 VITALS — BP 116/78 | HR 107 | Temp 98.1°F | Ht 66.0 in | Wt 221.0 lb

## 2018-06-24 DIAGNOSIS — R600 Localized edema: Secondary | ICD-10-CM

## 2018-06-24 DIAGNOSIS — E7800 Pure hypercholesterolemia, unspecified: Secondary | ICD-10-CM

## 2018-06-24 DIAGNOSIS — I10 Essential (primary) hypertension: Secondary | ICD-10-CM

## 2018-06-24 DIAGNOSIS — Z7289 Other problems related to lifestyle: Secondary | ICD-10-CM

## 2018-06-24 DIAGNOSIS — D72829 Elevated white blood cell count, unspecified: Secondary | ICD-10-CM | POA: Diagnosis not present

## 2018-06-24 DIAGNOSIS — F17200 Nicotine dependence, unspecified, uncomplicated: Secondary | ICD-10-CM

## 2018-06-24 DIAGNOSIS — E876 Hypokalemia: Secondary | ICD-10-CM | POA: Insufficient documentation

## 2018-06-24 DIAGNOSIS — K709 Alcoholic liver disease, unspecified: Secondary | ICD-10-CM

## 2018-06-24 DIAGNOSIS — R17 Unspecified jaundice: Secondary | ICD-10-CM

## 2018-06-24 DIAGNOSIS — K7011 Alcoholic hepatitis with ascites: Secondary | ICD-10-CM

## 2018-06-24 DIAGNOSIS — Z789 Other specified health status: Secondary | ICD-10-CM

## 2018-06-24 DIAGNOSIS — E1165 Type 2 diabetes mellitus with hyperglycemia: Secondary | ICD-10-CM

## 2018-06-24 LAB — CBC WITH DIFFERENTIAL/PLATELET
Basophils Relative: 0.4 % (ref 0.0–3.0)
EOS PCT: 1.5 % (ref 0.0–5.0)
HCT: 36.2 % — ABNORMAL LOW (ref 39.0–52.0)
Hemoglobin: 12.2 g/dL — ABNORMAL LOW (ref 13.0–17.0)
Lymphocytes Relative: 9.3 % — ABNORMAL LOW (ref 12.0–46.0)
MCHC: 33.8 g/dL (ref 30.0–36.0)
MCV: 109.4 fl — ABNORMAL HIGH (ref 78.0–100.0)
Monocytes Relative: 7.7 % (ref 3.0–12.0)
Neutrophils Relative %: 81.1 % — ABNORMAL HIGH (ref 43.0–77.0)
Platelets: 308 10*3/uL (ref 150.0–400.0)
RBC: 3.31 Mil/uL — ABNORMAL LOW (ref 4.22–5.81)
RDW: 15.2 % (ref 11.5–15.5)
WBC: 19.4 10*3/uL (ref 4.0–10.5)

## 2018-06-24 LAB — RENAL FUNCTION PANEL
Albumin: 2.6 g/dL — ABNORMAL LOW (ref 3.5–5.2)
BUN: 21 mg/dL (ref 6–23)
CO2: 30 mEq/L (ref 19–32)
Calcium: 8.6 mg/dL (ref 8.4–10.5)
Chloride: 97 mEq/L (ref 96–112)
Creatinine, Ser: 0.76 mg/dL (ref 0.40–1.50)
GFR: 127.1 mL/min (ref >60.00)
Glucose, Bld: 87 mg/dL (ref 70–99)
PHOSPHORUS: 3.2 mg/dL (ref 2.3–4.6)
Potassium: 3 mEq/L — ABNORMAL LOW (ref 3.5–5.1)
Sodium: 137 mEq/L (ref 135–145)

## 2018-06-24 LAB — HEPATIC FUNCTION PANEL
ALT: 29 U/L (ref 0–53)
AST: 75 U/L — ABNORMAL HIGH (ref 0–37)
Albumin: 2.6 g/dL — ABNORMAL LOW (ref 3.5–5.2)
Alkaline Phosphatase: 869 U/L — ABNORMAL HIGH (ref 39–117)
BILIRUBIN DIRECT: 5.6 mg/dL — AB (ref 0.0–0.3)
BILIRUBIN TOTAL: 9.3 mg/dL — AB (ref 0.2–1.2)
Total Protein: 6.9 g/dL (ref 6.0–8.3)

## 2018-06-24 NOTE — Patient Instructions (Addendum)
pepcid is helpful for heartburn as needed   Try to stick to a diabetic diet   Labs today (kidney/liver/blood count)   Try to take care of yourself   Please call if symptoms change/ if you get short of breath   Keep working on quitting smoking

## 2018-06-24 NOTE — Assessment & Plan Note (Signed)
Following liver labs  Reviewed hospital records, lab results and studies in detail   For f/u with GI (Dr Carlean Purl) later this mo Still jaundiced Ascites and pedal edema noted (taking spironolactone and hct) Lab today Has entirely quit etoh

## 2018-06-24 NOTE — Progress Notes (Signed)
Subjective:    Patient ID: Zachary Lewis, male    DOB: 22-Dec-1960, 58 y.o.   MRN: [id number]  HPI Here for f/u of chronic health problems   Wt Readings from Last 3 Encounters:  06/24/18 221 lb (100.2 kg)  06/05/18 198 lb 12 oz (90.2 kg)  06/01/18 203 lb 14.8 oz (92.5 kg)  has been retaining a lot of fluids  35.67 kg/m   Recent hospitalization for alcoholic hepatitis  Has f/u with GI /Dr Carlean Purl on 2/13  Thinks his abdomen is bigger/ascites   Still swollen in ankles     Pulse Readings from Last 3 Encounters:  06/24/18 (!) 107  06/19/18 76  06/05/18 85       bp is stable today  No cp or palpitations or headaches or edema  No side effects to medicines  BP Readings from Last 3 Encounters:  06/19/18 (!) 102/58  06/05/18 110/66  06/01/18 107/72    Now taking aldactone for fluid also  Lisinopril hct 20-25 Hypokalemia with last draw -Dr Damita Dunnings tx with 40 meq of K and then started aldactone  No abd pain  No alcohol at all  No withdrawal   He does not think he needs counseling or AA Got all etoh out of the house  Wife and family are supportive    Lab Results  Component Value Date   CREATININE 0.78 06/19/2018   BUN 28 (H) 06/19/2018   NA 135 06/19/2018   K 2.7 (LL) 06/19/2018   CL 94 (L) 06/19/2018   CO2 31 06/19/2018  he is only drinking water now  Lab Results  Component Value Date   ALT 34 06/19/2018   AST 84 (H) 06/19/2018   ALKPHOS 703 (H) 06/19/2018   BILITOT 9.0 (H) 06/19/2018    Cholesterol  Lab Results  Component Value Date   CHOL 574 (H) 05/29/2018   HDL 14.20 (L) 05/29/2018   LDLCALC 522 (H) 05/29/2018   LDLDIRECT 176.9 12/08/2012   TRIG 190.0 (H) 05/29/2018   CHOLHDL 40 05/29/2018  was not taking his simvastatin   BNP was 386 last check   Elevated wbc Lab Results  Component Value Date   WBC 19.3 Repeated and verified X2. (HH) 06/19/2018   HGB 12.1 (L) 06/19/2018   HCT 35.6 (L) 06/19/2018   MCV 109.6 (H) 06/19/2018   PLT 358.0  06/19/2018    DM  Lab Results  Component Value Date   HGBA1C 6.2 06/19/2018   This was improved but less than a mo after prev which was 10   He is taking his medicine compliantly !    Smoking status - has cut down to 1 cig per day- almost quit   Patient Active Problem List   Diagnosis Date Noted  . Hypokalemia 06/24/2018  . Alcoholic hepatitis   . Alcoholic liver disease ([place])   . Jaundice   . Leukocytosis   . Fatigue 05/29/2018  . Pedal edema 05/29/2018  . Scleral icterus 05/29/2018  . Exposure to the flu 05/29/2018  . Acute cholecystitis 05/29/2018  . Transaminitis 05/29/2018  . Alcohol use 05/29/2018  . Smoking 01/04/2014  . Personal history of colonic adenomas 03/09/2013  . Prostate cancer screening 12/07/2012  . Routine general medical examination at a health care facility 08/23/2010  . HYPERTENSION, BENIGN ESSENTIAL 01/05/2010  . BACK PAIN, LUMBAR, WITH RADICULOPATHY 09/13/2008  . Obesity 09/08/2007  . TINEA PEDIS 07/16/2007  . Diabetes type 2, uncontrolled ([place]) 08/13/2006  . Hyperlipidemia 08/13/2006  .  ALLERGY 08/13/2006  . Fatty liver 08/13/2006   Past Medical History:  Diagnosis Date  . Diabetes mellitus    Type II  . Hyperlipidemia   . Hypertension   . Obesity   . Personal history of colonic adenomas 03/09/2013  . Tinea pedis    Chronic   Past Surgical History:  Procedure Laterality Date  . COLONOSCOPY W/ POLYPECTOMY  2014  . myringectomy  06/2005  . UMBILICAL HERNIA REPAIR  01/1997  . VASECTOMY  07/2002   Social History   Tobacco Use  . Smoking status: Current Every Day Smoker    Packs/day: 0.25  . Smokeless tobacco: Never Used  Substance Use Topics  . 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 Drug use: No   Family History  Problem Relation Age of Onset  . Diabetes Neg Hx   . Heart disease Neg Hx   . Colon cancer Neg Hx    No Known Allergies Current Outpatient Medications on File Prior to  Visit  Medication Sig Dispense Refill  . Blood Glucose Monitoring Suppl (BLOOD GLUCOSE METER) kit To check glucose once daily and as needed for diabetes 1 each 0  . folic acid (FOLVITE) 1 MG tablet Take 1 tablet (1 mg total) by mouth daily. 30 tablet 0  . glipiZIDE (GLUCOTROL XL) 10 MG 24 hr tablet Take 1 tablet (10 mg total) by mouth daily.    . Lancets MISC To check glucose once daily and as needed for diabetes 10 each 3  . lisinopril-hydrochlorothiazide (PRINZIDE,ZESTORETIC) 20-25 MG tablet Take 1 tablet by mouth daily. 90 tablet 3  . metFORMIN (GLUCOPHAGE) 500 MG tablet Take 1 tablet (500 mg total) by mouth 2 (two) times daily with a meal.    . simvastatin (ZOCOR) 20 MG tablet Held as of 06/05/2018    . spironolactone (ALDACTONE) 25 MG tablet Take 1 tablet (25 mg total) by mouth daily. 30 tablet 0  . thiamine 100 MG tablet Take 1 tablet (100 mg total) by mouth daily. 30 tablet 0  . potassium chloride SA (K-DUR,KLOR-CON) 20 MEQ tablet Take 2 tablets (40 mEq total) by mouth once for 1 dose. 2 tablet 0   No current facility-administered medications on file prior to visit.     Review of Systems  Constitutional: Positive for fatigue. Negative for activity change, appetite change, fever and unexpected weight change.  HENT: Negative for congestion, rhinorrhea, sore throat and trouble swallowing.   Eyes: Negative for pain, redness, itching and visual disturbance.  Respiratory: Negative for cough, chest tightness, shortness of breath and wheezing.   Cardiovascular: Negative for chest pain and palpitations.  Gastrointestinal: Positive for abdominal distention. Negative for abdominal pain, blood in stool, constipation, diarrhea and nausea.       Abd distension w/o pain   Endocrine: Negative for cold intolerance, heat intolerance, polydipsia and polyuria.  Genitourinary: Negative for difficulty urinating, dysuria, frequency and urgency.  Musculoskeletal: Negative for arthralgias, joint swelling and  myalgias.  Skin: Positive for color change. Negative for pallor and rash.       Jaundice No itching   Neurological: Negative for dizziness, tremors, weakness, numbness and headaches.  Hematological: Negative for adenopathy. Does not bruise/bleed easily.  Psychiatric/Behavioral: Negative for decreased concentration and dysphoric mood. The patient is not nervous/anxious.        Objective:   Physical Exam Constitutional:      General: He is not in acute distress.  Appearance: He is obese. He is not ill-appearing.  HENT:     Head: Atraumatic.     Mouth/Throat:     Mouth: Mucous membranes are moist.     Pharynx: Oropharynx is clear.  Eyes:     General: Scleral icterus present.     Extraocular Movements: Extraocular movements intact.     Pupils: Pupils are equal, round, and reactive to light.  Neck:     Musculoskeletal: Normal range of motion. No neck rigidity.  Cardiovascular:     Rate and Rhythm: Regular rhythm. Tachycardia present.     Heart sounds: Normal heart sounds.  Pulmonary:     Effort: Pulmonary effort is normal. No respiratory distress.     Breath sounds: No stridor. No wheezing, rhonchi or rales.  Abdominal:     General: Abdomen is protuberant. Bowel sounds are normal. There is distension.     Palpations: Abdomen is soft. There is fluid wave. There is no mass.     Tenderness: There is no abdominal tenderness. Negative signs include Murphy's sign.     Comments: Ascites noted   Musculoskeletal:     Right lower leg: Edema present.     Left lower leg: Edema present.     Comments: 1-2 plus pitting pedal edema to calf  Lymphadenopathy:     Cervical: No cervical adenopathy.  Neurological:     Mental Status: He is alert.           Assessment & Plan:   Problem List Items Addressed This Visit      Cardiovascular and Mediastinum   HYPERTENSION, BENIGN ESSENTIAL    bp in fair control at this time  BP Readings from Last 1 Encounters:  06/24/18 116/78   No  changes needed Most recent labs reviewed  Disc lifstyle change with low sodium diet and exercise          Digestive   Alcoholic liver disease ([place])    Lab today  Watching liver labs  Has quit etoh- declines need for AA or counseling  Exp edema and ascites -on spironolactone and hct bp is controlled  No h/o GI bleed        Alcoholic hepatitis - Primary    Following liver labs  Reviewed hospital records, lab results and studies in detail   For f/u with GI (Dr Carlean Purl) later this mo Still jaundiced Ascites and pedal edema noted (taking spironolactone and hct) Lab today Has entirely quit etoh        Relevant Orders   Hepatic function panel (Completed)     Endocrine   Diabetes type 2, uncontrolled ([place])    Glucose is improving with cessation of etoh and better diet         Other   Hyperlipidemia    Disc goals for lipids and reasons to control them Rev last labs with pt Rev low sat fat diet in detail  Taking statin now -this was not d/c in hospital      Smoking    Pt has cut down to 1 cig per day and plans to quit very soon  Commended       Pedal edema    Suspect due to liver dz  On spironolactone and hct  For GI f/u soon       Relevant Orders   Renal function panel (Completed)   Alcohol use    Pt quit etoh after last visit Has liver dz now  Declines need or desire for any program/counseling  or AA      Jaundice    From etoh liver dz/hepatitis Watching labs Reviewed hospital records, lab results and studies in detail         Leukocytosis    Has been elevated since liver dz diagnosed Neg hepatitis tests  Given zosyn in hospital and gb wall inflamed on imaging No pain or fever  No longer coughing  Re check today with path rev      Relevant Orders   CBC with Differential/Platelet (Completed)   Pathologist smear review   Hypokalemia    tx with 40 meq of K and started on spironloactone at visit with Dr Damita Dunnings  Re check today  No muscle cramps        Relevant Orders   Renal function panel (Completed)

## 2018-06-24 NOTE — Assessment & Plan Note (Signed)
Pt has cut down to 1 cig per day and plans to quit very soon  Commended

## 2018-06-24 NOTE — Telephone Encounter (Signed)
Aware, that is stable Pending rest of labs and review

## 2018-06-24 NOTE — Assessment & Plan Note (Signed)
tx with 40 meq of K and started on spironloactone at visit with Dr Damita Dunnings  Re check today  No muscle cramps

## 2018-06-24 NOTE — Assessment & Plan Note (Signed)
From etoh liver dz/hepatitis Watching labs Reviewed hospital records, lab results and studies in detail

## 2018-06-24 NOTE — Assessment & Plan Note (Signed)
Glucose is improving with cessation of etoh and better diet

## 2018-06-24 NOTE — Assessment & Plan Note (Signed)
Has been elevated since liver dz diagnosed Neg hepatitis tests  Given zosyn in hospital and gb wall inflamed on imaging No pain or fever  No longer coughing  Re check today with path rev

## 2018-06-24 NOTE — Assessment & Plan Note (Signed)
Suspect due to liver dz  On spironolactone and hct  For GI f/u soon

## 2018-06-24 NOTE — Assessment & Plan Note (Addendum)
Disc goals for lipids and reasons to control them Rev last labs with pt Rev low sat fat diet in detail  Taking statin now -this was not d/c in hospital

## 2018-06-24 NOTE — Assessment & Plan Note (Signed)
bp in fair control at this time  BP Readings from Last 1 Encounters:  06/24/18 116/78   No changes needed Most recent labs reviewed  Disc lifstyle change with low sodium diet and exercise

## 2018-06-24 NOTE — Assessment & Plan Note (Signed)
Lab today  Watching liver labs  Has quit etoh- declines need for AA or counseling  Exp edema and ascites -on spironolactone and hct bp is controlled  No h/o GI bleed

## 2018-06-24 NOTE — Telephone Encounter (Signed)
[facility] called critical results, WBC - 19.3. Results given to Dr Glori Bickers

## 2018-06-24 NOTE — Assessment & Plan Note (Signed)
Pt quit etoh after last visit Has liver dz now  Declines need or desire for any program/counseling or AA

## 2018-06-25 ENCOUNTER — Telehealth: Payer: Self-pay | Admitting: Family Medicine

## 2018-06-25 LAB — PATHOLOGIST SMEAR REVIEW

## 2018-06-25 MED ORDER — POTASSIUM CHLORIDE ER 10 MEQ PO TBCR: 10.0000 meq | EXTENDED_RELEASE_TABLET | Freq: Every day | ORAL | 0 refills | Status: DC

## 2018-06-25 MED ORDER — SPIRONOLACTONE 50 MG PO TABS: 50.0000 mg | ORAL_TABLET | Freq: Every day | ORAL | 1 refills | Status: DC

## 2018-06-25 NOTE — Telephone Encounter (Signed)
K is still low but higher than it was I will send in low dose potassium to take daily  For swelling increase spironolactone to 50 mg once daily  We will watch K carefully with this  I will send that in also   Liver tests are stable to improving  White blood cell count is still high- that may be reactive to the liver process   Re check renal/hepatic and cbc in 1 week please   Keep [place] posted re: symptoms

## 2018-06-25 NOTE — Telephone Encounter (Signed)
Pt notified of Dr. Marliss Coots comments and instructions and verbalized understanding. He will go get meds and increase spironolactone to 31m qd. Per Dr. TGlori Bickerssince his GI appt is in a week, she will request that the GI doc recheck labs

## 2018-06-30 ENCOUNTER — Telehealth: Payer: Self-pay | Admitting: Family Medicine

## 2018-06-30 NOTE — Telephone Encounter (Signed)
He saw me on 2/6 and has an appt with GI (Dr Carlean Purl) on 2/13

## 2018-06-30 NOTE — Telephone Encounter (Signed)
Zachary Lewis,[employer] Case Manager,called.  She said patient was in the hospital a month ago and she wanted to make sure patient made follow up appointment from the hospital.  Patient isn't being compliant with his medication Lisinopril. Any questions, call [place].

## 2018-07-02 ENCOUNTER — Encounter

## 2018-07-02 ENCOUNTER — Other Ambulatory Visit (INDEPENDENT_AMBULATORY_CARE_PROVIDER_SITE_OTHER): Payer: Managed Care, Other (non HMO)

## 2018-07-02 ENCOUNTER — Ambulatory Visit (HOSPITAL_COMMUNITY)
Admission: RE | Admit: 2018-07-02 | Discharge: 2018-07-02 | Disposition: A | Discharge status: Home / Self Care | Payer: Managed Care, Other (non HMO) | Source: Ambulatory Visit | Attending: Internal Medicine | Admitting: Internal Medicine

## 2018-07-02 ENCOUNTER — Encounter: Payer: Self-pay | Admitting: Internal Medicine

## 2018-07-02 ENCOUNTER — Ambulatory Visit (INDEPENDENT_AMBULATORY_CARE_PROVIDER_SITE_OTHER): Payer: Managed Care, Other (non HMO) | Admitting: Internal Medicine

## 2018-07-02 VITALS — BP 100/72 | HR 100 | Ht 66.0 in | Wt 218.0 lb

## 2018-07-02 DIAGNOSIS — R188 Other ascites: Secondary | ICD-10-CM

## 2018-07-02 DIAGNOSIS — K7460 Unspecified cirrhosis of liver: Secondary | ICD-10-CM | POA: Insufficient documentation

## 2018-07-02 DIAGNOSIS — R601 Generalized edema: Secondary | ICD-10-CM

## 2018-07-02 HISTORY — PX: IR PARACENTESIS: IMG2679

## 2018-07-02 LAB — ALBUMIN, PLEURAL OR PERITONEAL FLUID: Albumin, Fluid: 1 g/dL

## 2018-07-02 LAB — BODY FLUID CELL COUNT WITH DIFFERENTIAL
Lymphs, Fluid: 50 %
Monocyte-Macrophage-Serous Fluid: 22 % — ABNORMAL LOW (ref 50–90)
Neutrophil Count, Fluid: 28 % — ABNORMAL HIGH (ref 0–25)
Total Nucleated Cell Count, Fluid: 103 cu mm (ref 0–1000)

## 2018-07-02 LAB — COMPREHENSIVE METABOLIC PANEL
ALT: 31 U/L (ref 0–53)
AST: 59 U/L — AB (ref 0–37)
Albumin: 2.8 g/dL — ABNORMAL LOW (ref 3.5–5.2)
Alkaline Phosphatase: 988 U/L — ABNORMAL HIGH (ref 39–117)
BUN: 17 mg/dL (ref 6–23)
CHLORIDE: 99 meq/L (ref 96–112)
CO2: 26 mEq/L (ref 19–32)
Calcium: 8.5 mg/dL (ref 8.4–10.5)
Creatinine, Ser: 0.71 mg/dL (ref 0.40–1.50)
GFR: 137.47 mL/min (ref >60.00)
Glucose, Bld: 204 mg/dL — ABNORMAL HIGH (ref 70–99)
Potassium: 3.7 mEq/L (ref 3.5–5.1)
SODIUM: 133 meq/L — AB (ref 135–145)
Total Bilirubin: 7.8 mg/dL — ABNORMAL HIGH (ref 0.2–1.2)
Total Protein: 7.3 g/dL (ref 6.0–8.3)

## 2018-07-02 LAB — PROTIME-INR
INR: 1.3 ratio — ABNORMAL HIGH (ref 0.8–1.0)
Prothrombin Time: 15.4 s — ABNORMAL HIGH (ref 9.6–13.1)

## 2018-07-02 LAB — GRAM STAIN

## 2018-07-02 MED ORDER — LIDOCAINE HCL 1 % IJ SOLN
INTRAMUSCULAR | Status: AC
  Filled 2018-07-02: qty 20

## 2018-07-02 MED ORDER — LIDOCAINE HCL (PF) 1 % IJ SOLN
INTRAMUSCULAR | Status: DC | PRN
  Administered 2018-07-02: 10 mL

## 2018-07-02 NOTE — Patient Instructions (Signed)
Your provider has requested that you go to the basement level for lab work before leaving today. Press "B" on the elevator. The lab is located at the first door on the left as you exit the elevator.   Hold your glipizide per Dr Carlean Purl.   You have been scheduled for an abdominal paracentesis at [facility] radiology (1st floor of hospital) on 07/02/2018 at 1:00PM. Please arrive at least 15 minutes prior to your appointment time for registration. Should you need to reschedule this appointment for any reason, please call our office at [phone].   We will work on getting a liver biopsy set up.   I appreciate the opportunity to care for you. Silvano Rusk, MD, [facility]

## 2018-07-02 NOTE — Progress Notes (Signed)
Zachary Lewis 58 y.o. Aug 04, 1960 [id number]  Assessment & Plan:   Encounter Diagnoses  Name Primary?  . Cirrhosis of liver with ascites, unspecified hepatic cirrhosis type ([place]) Yes  . Anasarca     MRI has suggested cirrhosis, clinical scenario is compatible that though his platelets are normal.  I had thought he had alcoholic liver disease with likely contribution of nonalcoholic fatty liver disease as well.  He has a markedly elevated ferritin above 7893 which is certainly possible with alcoholic liver disease.  He is black which goes against hemochromatosis but does not rule it out.  We see no signs of autoimmune disease based upon serologies and his IgG level.  The current pattern looks more like a cholestatic pattern with a markedly elevated alkaline phosphatase and bilirubin.  He is having a little bit of pruritus.  PSC was not suggested on the MRCP but could be small duct disease.  At this point he is going to need a liver biopsy.  Also note that I am not aware of any supplement usage but may need to requestion him on that.  Liver biopsy is problematic with the massive ascites.  Transjugular biopsy is probably necessary.  He has been scheduled for a paracentesis today. Labs to check CMET and INR today. Hold glipizide with his liver dysfunction.  He may actually need to take insulin.  Once I see labs in the paracentesis, will order the liver biopsy and I think probably drop his lisinopril HCTZ and add furosemide and increase the spironolactone further.  Need to remind him about a low-sodium diet as well.  CC: Tower, Wynelle Fanny, MD   Subjective:   Chief Complaint:  HPI Zachary Lewis is here with his wife, I met him in the hospital in January, he was jaundiced and an MRI showed cirrhosis and the working diagnosis was alcoholic liver disease.  He also had influenza at that time.  Since that time, he is developed ascites and edema by clinical history.  He is short of breath.  His  spironolactone was increased from 25 mg to 50 mg daily recently after he saw Dr. Glori Bickers.  He remains jaundiced and is having some intermittent mild pruritus.  His LFTs have essentially remained the same.  He had an elevated ferritin which Dr. Havery Moros and I thought was reactive.  He has had abnormal transaminases and alkaline phosphatase for a number of years thought related to fatty liver.  He is diabetic.  He has uncontrolled diabetes mellitus on glipizide and metformin. Wt Readings from Last 3 Encounters:  07/02/18 218 lb (98.9 kg)  06/24/18 221 lb (100.2 kg)  06/05/18 198 lb 12 oz (90.2 kg)   He has not been drinking anymore. No Known Allergies Current Meds  Medication Sig  . Blood Glucose Monitoring Suppl (BLOOD GLUCOSE METER) kit To check glucose once daily and as needed for diabetes  . folic acid (FOLVITE) 1 MG tablet Take 1 tablet (1 mg total) by mouth daily.  Marland Kitchen glipiZIDE (GLUCOTROL XL) 10 MG 24 hr tablet Take 1 tablet (10 mg total) by mouth daily.  . Lancets MISC To check glucose once daily and as needed for diabetes  . lisinopril-hydrochlorothiazide (PRINZIDE,ZESTORETIC) 20-25 MG tablet Take 1 tablet by mouth daily.  . metFORMIN (GLUCOPHAGE) 500 MG tablet Take 1 tablet (500 mg total) by mouth 2 (two) times daily with a meal.  . potassium chloride (KLOR-CON 10) 10 MEQ tablet Take 1 tablet (10 mEq total) by mouth daily.  Marland Kitchen  potassium chloride SA (K-DUR,KLOR-CON) 20 MEQ tablet Take 2 tablets (40 mEq total) by mouth once for 1 dose.  . simvastatin (ZOCOR) 20 MG tablet Held as of 06/05/2018  . spironolactone (ALDACTONE) 50 MG tablet Take 1 tablet (50 mg total) by mouth daily.  Marland Kitchen thiamine 100 MG tablet Take 1 tablet (100 mg total) by mouth daily.   Past Medical History:  Diagnosis Date  . Diabetes mellitus    Type II  . Hyperlipidemia   . Hypertension   . Obesity   . Personal history of colonic adenomas 03/09/2013  . Tinea pedis    Chronic   Past Surgical History:  Procedure  Laterality Date  . COLONOSCOPY W/ POLYPECTOMY  2014  . myringectomy  06/2005  . UMBILICAL HERNIA REPAIR  01/1997  . VASECTOMY  07/2002   Social History   Social History Narrative   Married, works at [employer], heavy drinking of Canadian mist alcohol   No drug use   family history is not on file.   Review of Systems As per HPI  Objective:   Physical Exam BP 100/72 (BP Location: Left Arm, Patient Position: Sitting, Cuff Size: Normal)   Pulse 100   Ht 5' 6" (1.676 m) Comment: height measured without shoes  Wt 218 lb (98.9 kg)   BMI 35.19 kg/m  Obese mildly chronically ill-appearing black man no acute distress There is scleral icterus The lungs are clear The heart sounds are normal The abdomen is distended with a large amount of ascites but is nontender There is 2+ peripheral edema into the mid pretibial region and some presacral edema There are some excoriations on the skin scattered about He appears alert and oriented with an appropriate mood and affect

## 2018-07-02 NOTE — Procedures (Signed)
PROCEDURE SUMMARY:  Successful US guided paracentesis from RLQ.  Yielded 4 L of clear yellow fluid.  No immediate complications.  Pt tolerated well.   Specimen was sent for labs.  EBL < 58m  KAscencion DikePA-C 07/02/2018 3:17 PM

## 2018-07-03 ENCOUNTER — Telehealth: Payer: Self-pay | Admitting: *Deleted

## 2018-07-03 ENCOUNTER — Other Ambulatory Visit: Payer: Self-pay

## 2018-07-03 DIAGNOSIS — K7460 Unspecified cirrhosis of liver: Secondary | ICD-10-CM

## 2018-07-03 DIAGNOSIS — R188 Other ascites: Principal | ICD-10-CM

## 2018-07-03 DIAGNOSIS — K709 Alcoholic liver disease, unspecified: Secondary | ICD-10-CM

## 2018-07-03 LAB — PATHOLOGIST SMEAR REVIEW

## 2018-07-03 MED ORDER — FUROSEMIDE 40 MG PO TABS: 40.0000 mg | ORAL_TABLET | Freq: Every day | ORAL | 2 refills | Status: DC

## 2018-07-03 NOTE — Progress Notes (Signed)
tranjug

## 2018-07-03 NOTE — Progress Notes (Signed)
No infection in the ascites at this point  Please let him know  He needs to do the following: 1) Schedule transjugular liver bx in IR  for cirrhosis dx (with ascites don't think can do percutaneous) 2) dc lisinopril/HCTZ 3) Furosemide 40 mg qd # 30 2 RF 4) Make spironolactone 100 mg po gd (he has 50's needs to take 2 and can have a new Rx if needs one - 50 mg tabs take 2 each day # 60 2 RF 5) CMET on 2/24 6) Low Na diet  7) 1.5 L fluid restriction 8) if he thinks he needs another tap in between let us know

## 2018-07-03 NOTE — Telephone Encounter (Signed)
Crista, RN with [facility] indicates pt is needing a Rx for diabetic supplies. No telephone number provided but she believes [employer] may be covered, but did not indicate which type.

## 2018-07-06 NOTE — Progress Notes (Signed)
Have already reviewed ascites analysis results We have referred for liver bx Please make a f/u for about 1 month w/ me also

## 2018-07-07 LAB — CULTURE, BODY FLUID W GRAM STAIN -BOTTLE: Culture: NO GROWTH

## 2018-07-07 MED ORDER — ONETOUCH VERIO VI SOLN: 3 refills | Status: DC

## 2018-07-07 MED ORDER — ONETOUCH DELICA LANCETS 30G MISC: 1.0000 | Freq: Every day | 3 refills | Status: DC | PRN

## 2018-07-07 MED ORDER — ONETOUCH VERIO W/DEVICE KIT: PACK | 0 refills | Status: DC

## 2018-07-07 MED ORDER — GLUCOSE BLOOD VI STRP: ORAL_STRIP | 3 refills | Status: DC

## 2018-07-07 NOTE — Telephone Encounter (Signed)
Rx sent 

## 2018-07-13 ENCOUNTER — Encounter: Payer: Managed Care, Other (non HMO) | Attending: Family Medicine | Admitting: Registered"

## 2018-07-13 ENCOUNTER — Other Ambulatory Visit (INDEPENDENT_AMBULATORY_CARE_PROVIDER_SITE_OTHER): Payer: Managed Care, Other (non HMO)

## 2018-07-13 ENCOUNTER — Encounter: Payer: Self-pay | Admitting: Registered"

## 2018-07-13 DIAGNOSIS — K7460 Unspecified cirrhosis of liver: Secondary | ICD-10-CM | POA: Diagnosis not present

## 2018-07-13 DIAGNOSIS — E119 Type 2 diabetes mellitus without complications: Secondary | ICD-10-CM | POA: Insufficient documentation

## 2018-07-13 DIAGNOSIS — K709 Alcoholic liver disease, unspecified: Secondary | ICD-10-CM

## 2018-07-13 DIAGNOSIS — R188 Other ascites: Secondary | ICD-10-CM | POA: Diagnosis not present

## 2018-07-13 LAB — COMPREHENSIVE METABOLIC PANEL
ALK PHOS: 1131 U/L — AB (ref 39–117)
ALT: 34 U/L (ref 0–53)
AST: 45 U/L — ABNORMAL HIGH (ref 0–37)
Albumin: 2.9 g/dL — ABNORMAL LOW (ref 3.5–5.2)
BILIRUBIN TOTAL: 5.4 mg/dL — AB (ref 0.2–1.2)
BUN: 12 mg/dL (ref 6–23)
CO2: 27 mEq/L (ref 19–32)
Calcium: 8.8 mg/dL (ref 8.4–10.5)
Chloride: 100 mEq/L (ref 96–112)
Creatinine, Ser: 0.64 mg/dL (ref 0.40–1.50)
GFR: 154.95 mL/min (ref >60.00)
Glucose, Bld: 195 mg/dL — ABNORMAL HIGH (ref 70–99)
Potassium: 4.3 mEq/L (ref 3.5–5.1)
Sodium: 135 mEq/L (ref 135–145)
TOTAL PROTEIN: 7.4 g/dL (ref 6.0–8.3)

## 2018-07-13 NOTE — Progress Notes (Signed)
Diabetes Self-Management Education  Visit Type:  First/Initial  Appt. Start Time: 9:15 Appt. End Time: 10:32  07/13/2018  Mr. Zachary Lewis, identified by name and date of birth, is a 58 y.o. male with a diagnosis of Diabetes: Type 2.   ASSESSMENT  Pt expectations: to make sure he's cooking and eating the right things  Pt states he was sick last month and made alot of changes since then. Pt states he has already stopped eating fried foods, has eliminated eating canned food options and started low sodium eating plan last week. Pt states he has not picked up meter yet to start checking BS. Pt states he did not take diabetes seriously in the past but wants to take better care of his health now.    Pt reports current fluid restriction of 1.5 L/day.   Pt states he has been trying to reduce portion sizes but is sometimes still hungry after dinner. Pt states he lost 18 lbs doing the keto diet last summer.   Pt reports dietary recall prior to last month changes: B: [place]-sausage, egg, and cheese biscuit L: salad or low-carb wrap S: peanuts D: fried fish or spaghetti with pepperoni Beverages: coffee   There were no vitals taken for this visit. There is no height or weight on file to calculate BMI.   Diabetes Self-Management Education - 07/13/18 0925      Initial Visit   What type of meal plan do you follow?  low sodium      Health Coping   How would you rate your overall health?  Fair      Psychosocial Assessment   Patient Belief/Attitude about Diabetes  Motivated to manage diabetes    Self-care barriers  None    Self-management support  Family    Patient Concerns  Nutrition/Meal planning    Special Needs  None    Preferred Learning Style  No preference indicated    Learning Readiness  Change in progress      Complications   Last HgB A1C per patient/outside source  10 %    How often do you check your blood sugar?  0 times/day (not testing)    Number of  hypoglycemic episodes per month  0    Number of hyperglycemic episodes per week  2    Can you tell when your blood sugar is high?  Yes    What do you do if your blood sugar is high?  drinks water    Have you had a dilated eye exam in the past 12 months?  Yes    Have you had a dental exam in the past 12 months?  Yes    Are you checking your feet?  Yes    How many days per week are you checking your feet?  5      Dietary Intake   Breakfast  2 pks instant oatmeal or cheerios or wheat waffles with sugar-free syrup or [place] bacon + egg + cheese  + bread    Snack (morning)  sometimes halo top oranges (2-3)    Lunch  garden salad (no meat)    Snack (afternoon)  sometimes halo top oranges (2-3)    Dinner  chicken + broccoli + keto soup or [place] hot dog + chili + slaw + 5-6 french fries    Snack (evening)  none    Beverage(s)  water, diet mountain dew (rarely)      Exercise   Exercise Type  Light (walking /  raking leaves)   walks track and biking   How many days per week to you exercise?  3    How many minutes per day do you exercise?  45    Total minutes per week of exercise  135      Patient Education   Previous Diabetes Education  Yes (please comment)    Disease state   Definition of diabetes, type 1 and 2, and the diagnosis of diabetes;Factors that contribute to the development of diabetes    Nutrition management   Food label reading, portion sizes and measuring food.;Carbohydrate counting;Reviewed blood glucose goals for pre and post meals and how to evaluate the patients' food intake on their blood glucose level.;Effects of alcohol on blood glucose and safety factors with consumption of alcohol.;Information on hints to eating out and maintain blood glucose control.;Role of diet in the treatment of diabetes and the relationship between the three main macronutrients and blood glucose level    Physical activity and exercise   Role of exercise on diabetes management, blood pressure control  and cardiac health.    Monitoring  Purpose and frequency of SMBG.;Identified appropriate SMBG and/or A1C goals.;Daily foot exams;Yearly dilated eye exam;Taught/discussed recording of test results and interpretation of SMBG.;Interpreting lab values - A1C, lipid, urine microalbumina.    Acute complications  Taught treatment of hypoglycemia - the 15 rule.;Discussed and identified patients' treatment of hyperglycemia.    Chronic complications  Relationship between chronic complications and blood glucose control;Lipid levels, blood glucose control and heart disease;Reviewed with patient heart disease, higher risk of, and prevention    Psychosocial adjustment  Role of stress on diabetes;Identified and addressed patients feelings and concerns about diabetes;Brainstormed with patient on coping mechanisms for social situations, getting support from significant others, dealing with feelings about diabetes      Individualized Goals (developed by patient)   Nutrition  Follow meal plan discussed;General guidelines for healthy choices and portions discussed    Physical Activity  Exercise 3-5 times per week;45 minutes per day    Medications  take my medication as prescribed    Monitoring   test my blood glucose as discussed;test blood glucose pre and post meals as discussed    Reducing Risk  examine blood glucose patterns;do foot checks daily;increase portions of nuts and seeds;treat hypoglycemia with 15 grams of carbs if blood glucose less than 86m/dL    Health Coping  Not Applicable      Post-Education Assessment   Patient understands the diabetes disease and treatment process.  Demonstrates understanding / competency    Patient understands incorporating nutritional management into lifestyle.  Demonstrates understanding / competency    Patient undertands incorporating physical activity into lifestyle.  Demonstrates understanding / competency    Patient understands using medications safely.  Demonstrates  understanding / competency    Patient understands monitoring blood glucose, interpreting and using results  Demonstrates understanding / competency    Patient understands prevention, detection, and treatment of acute complications.  Demonstrates understanding / competency    Patient understands prevention, detection, and treatment of chronic complications.  Demonstrates understanding / competency    Patient understands how to develop strategies to address psychosocial issues.  Demonstrates understanding / competency    Patient understands how to develop strategies to promote health/change behavior.  Demonstrates understanding / competency      Outcomes   Program Status  Completed       Learning Objective:  Patient will have a greater understanding of diabetes self-management. Patient education  plan is to attend individual and/or group sessions per assessed needs and concerns.   Plan:   Patient Instructions  Goals:  Follow Diabetes Meal Plan as instructed  Eat 3 meals and 2 snacks, every 3-5 hrs  Limit carbohydrate intake to 45-60 grams carbohydrate/meal  Limit carbohydrate intake to 15-30 grams carbohydrate/snack  Add lean protein foods to meals/snacks  Monitor glucose levels as instructed by your doctor  Aim for 30 mins of physical activity daily  Bring food record and glucose log to your next nutrition visit  Aim to balance meals using MyPlate for diabetes (on pg 15) as guide      Expected Outcomes:  Demonstrated interest in learning. Expect positive outcomes  Education material provided: ADA Diabetes: Your Take Control Guide and Carbohydrate counting sheet  If problems or questions, patient to contact team via:  Phone and Email  Future DSME appointment: - Yearly

## 2018-07-13 NOTE — Progress Notes (Signed)
Labs are similar some worse, some better Do we know where things are with liver biopsy appointment?

## 2018-07-13 NOTE — Patient Instructions (Signed)
Goals:  Follow Diabetes Meal Plan as instructed  Eat 3 meals and 2 snacks, every 3-5 hrs  Limit carbohydrate intake to 45-60 grams carbohydrate/meal  Limit carbohydrate intake to 15-30 grams carbohydrate/snack  Add lean protein foods to meals/snacks  Monitor glucose levels as instructed by your doctor  Aim for 30 mins of physical activity daily  Bring food record and glucose log to your next nutrition visit  Aim to balance meals using MyPlate for diabetes (on pg 15) as guide

## 2018-07-14 ENCOUNTER — Telehealth: Payer: Self-pay | Admitting: Internal Medicine

## 2018-07-14 NOTE — Telephone Encounter (Signed)
I spoke with the patient's wife.  All questions about the reason for he liver bx answered.  We scheduled a follow up for 07/27/18 so she and her husband can come in and discuss the results face to face with Dr. Carlean Purl

## 2018-07-14 NOTE — Telephone Encounter (Signed)
Pt wife called in and stated that you spoke with the pt about lab results. However she is wanting to speak with you as well.

## 2018-07-15 ENCOUNTER — Other Ambulatory Visit: Payer: Self-pay | Admitting: Radiology

## 2018-07-16 ENCOUNTER — Other Ambulatory Visit: Payer: Self-pay | Admitting: Radiology

## 2018-07-17 ENCOUNTER — Encounter (HOSPITAL_COMMUNITY): Payer: Self-pay

## 2018-07-17 ENCOUNTER — Ambulatory Visit (HOSPITAL_COMMUNITY)
Admission: RE | Admit: 2018-07-17 | Discharge: 2018-07-17 | Disposition: A | Discharge status: Home / Self Care | Payer: Managed Care, Other (non HMO) | Source: Ambulatory Visit | Attending: Internal Medicine | Admitting: Internal Medicine

## 2018-07-17 ENCOUNTER — Other Ambulatory Visit: Payer: Self-pay | Admitting: Internal Medicine

## 2018-07-17 DIAGNOSIS — R188 Other ascites: Principal | ICD-10-CM

## 2018-07-17 DIAGNOSIS — K709 Alcoholic liver disease, unspecified: Secondary | ICD-10-CM

## 2018-07-17 DIAGNOSIS — Z8249 Family history of ischemic heart disease and other diseases of the circulatory system: Secondary | ICD-10-CM | POA: Insufficient documentation

## 2018-07-17 DIAGNOSIS — K7460 Unspecified cirrhosis of liver: Secondary | ICD-10-CM | POA: Insufficient documentation

## 2018-07-17 DIAGNOSIS — E669 Obesity, unspecified: Secondary | ICD-10-CM | POA: Insufficient documentation

## 2018-07-17 DIAGNOSIS — Z7984 Long term (current) use of oral hypoglycemic drugs: Secondary | ICD-10-CM | POA: Diagnosis not present

## 2018-07-17 DIAGNOSIS — Z9852 Vasectomy status: Secondary | ICD-10-CM | POA: Diagnosis not present

## 2018-07-17 DIAGNOSIS — Z6829 Body mass index (BMI) 29.0-29.9, adult: Secondary | ICD-10-CM | POA: Insufficient documentation

## 2018-07-17 DIAGNOSIS — Z79899 Other long term (current) drug therapy: Secondary | ICD-10-CM | POA: Insufficient documentation

## 2018-07-17 DIAGNOSIS — E785 Hyperlipidemia, unspecified: Secondary | ICD-10-CM | POA: Insufficient documentation

## 2018-07-17 DIAGNOSIS — E119 Type 2 diabetes mellitus without complications: Secondary | ICD-10-CM | POA: Insufficient documentation

## 2018-07-17 DIAGNOSIS — K7581 Nonalcoholic steatohepatitis (NASH): Secondary | ICD-10-CM | POA: Insufficient documentation

## 2018-07-17 DIAGNOSIS — F17210 Nicotine dependence, cigarettes, uncomplicated: Secondary | ICD-10-CM | POA: Insufficient documentation

## 2018-07-17 DIAGNOSIS — I10 Essential (primary) hypertension: Secondary | ICD-10-CM | POA: Insufficient documentation

## 2018-07-17 HISTORY — PX: IR VENOGRAM HEPATIC WO HEMODYNAMIC EVALUATION: IMG693

## 2018-07-17 HISTORY — PX: IR TRANSCATHETER BX: IMG713

## 2018-07-17 LAB — PROTIME-INR
INR: 1 (ref 0.8–1.2)
Prothrombin Time: 13.2 seconds (ref 11.4–15.2)

## 2018-07-17 LAB — CBC
HCT: 38.1 % — ABNORMAL LOW (ref 39.0–52.0)
Hemoglobin: 12.6 g/dL — ABNORMAL LOW (ref 13.0–17.0)
MCH: 36.3 pg — ABNORMAL HIGH (ref 26.0–34.0)
MCHC: 33.1 g/dL (ref 30.0–36.0)
MCV: 109.8 fL — ABNORMAL HIGH (ref 80.0–100.0)
Platelets: 298 10*3/uL (ref 150–400)
RBC: 3.47 MIL/uL — ABNORMAL LOW (ref 4.22–5.81)
RDW: 15.9 % — ABNORMAL HIGH (ref 11.5–15.5)
WBC: 15.7 10*3/uL — ABNORMAL HIGH (ref 4.0–10.5)
nRBC: 0 % (ref 0.0–0.2)

## 2018-07-17 LAB — GLUCOSE, CAPILLARY
Glucose-Capillary: 201 mg/dL — ABNORMAL HIGH (ref 70–99)
Glucose-Capillary: 155 mg/dL — ABNORMAL HIGH (ref 70–99)

## 2018-07-17 MED ORDER — FENTANYL CITRATE (PF) 100 MCG/2ML IJ SOLN
INTRAMUSCULAR | Status: AC | PRN
  Administered 2018-07-17: 50 ug via INTRAVENOUS

## 2018-07-17 MED ORDER — FENTANYL CITRATE (PF) 100 MCG/2ML IJ SOLN
INTRAMUSCULAR | Status: AC
  Filled 2018-07-17: qty 2

## 2018-07-17 MED ORDER — HYDROCODONE-ACETAMINOPHEN 5-325 MG PO TABS: 1.0000 | ORAL_TABLET | ORAL | Status: DC | PRN

## 2018-07-17 MED ORDER — SODIUM CHLORIDE 0.9 % IV SOLN: INTRAVENOUS | Status: DC

## 2018-07-17 MED ORDER — MIDAZOLAM HCL 2 MG/2ML IJ SOLN
INTRAMUSCULAR | Status: AC | PRN
  Administered 2018-07-17: 1 mg via INTRAVENOUS

## 2018-07-17 MED ORDER — LIDOCAINE HCL (PF) 1 % IJ SOLN
INTRAMUSCULAR | Status: AC | PRN
  Administered 2018-07-17: 5 mL

## 2018-07-17 MED ORDER — MIDAZOLAM HCL 2 MG/2ML IJ SOLN
INTRAMUSCULAR | Status: AC
  Filled 2018-07-17: qty 2

## 2018-07-17 MED ORDER — LIDOCAINE HCL 1 % IJ SOLN
INTRAMUSCULAR | Status: AC
  Filled 2018-07-17: qty 20

## 2018-07-17 MED ORDER — IOPAMIDOL (ISOVUE-300) INJECTION 61%
INTRAVENOUS | Status: AC
  Administered 2018-07-17: 10 mL
  Filled 2018-07-17: qty 50

## 2018-07-17 NOTE — Discharge Instructions (Addendum)
Liver Biopsy, Care After These instructions give you information about how to care for yourself after your procedure. Your health care provider may also give you more specific instructions. If you have problems or questions, contact your health care provider. What can I expect after the procedure? After your procedure, it is common to have:  Pain and soreness in the area where the biopsy was done.  Bruising around the area where the biopsy was done.  Sleepiness and fatigue for 1-2 days. Follow these instructions at home: Medicines  Take over-the-counter and prescription medicines only as told by your health care provider.  If you were prescribed an antibiotic medicine, take it as told by your health care provider. Do not stop taking the antibiotic even if you start to feel better.  Do not take medicines such as aspirin and ibuprofen unless your health care provider tells you to take them. These medicines thin your blood and can increase the risk of bleeding.  If you are taking prescription pain medicine, take actions to prevent or treat constipation. Your health care provider may recommend that you: ? Drink enough fluid to keep your urine pale yellow. ? Eat foods that are high in fiber, such as fresh fruits and vegetables, whole grains, and beans. ? Limit foods that are high in fat and processed sugars, such as fried or sweet foods. ? Take an over-the-counter or prescription medicine for constipation. Incision care  Follow instructions from your health care provider about how to take care of your incision. Make sure you: ? Wash your hands with soap and water before you change your bandage (dressing). If soap and water are not available, use hand sanitizer. ? Change your dressing as told by your health care provider. ? Leave stitches (sutures), skin glue, or adhesive strips in place. These skin closures may need to stay in place for 2 weeks or longer. If adhesive strip edges start to  loosen and curl up, you may trim the loose edges. Do not remove adhesive strips completely unless your health care provider tells you to do that.  Check your incision area every day for signs of infection. Check for: ? Redness, swelling, or pain. ? Fluid or blood. ? Warmth. ? Pus or a bad smell.  Do not take baths, swim, or use a hot tub until your health care provider says it is okay to do so. Activity   Rest at home for 1-2 days, or as directed by your health care provider. ? Avoid sitting for a long time without moving. Get up to take short walks every 1-2 hours. This is important to improve blood flow and breathing. Ask for help if you feel weak or unsteady.  Return to your normal activities as told by your health care provider. Ask your health care provider what activities are safe for you.  Do not drive or use heavy machinery while taking prescription pain medicine.  Do not lift anything that is heavier than 10 lb (4.5 kg), or the limit that your health care provider tells you, until he or she says that it is safe.  Do not play contact sports for 2 weeks after the procedure. General instructions   Do not drink alcohol in the first week after the procedure.  Have someone stay with you for at least 24 hours after the procedure.  It is your responsibility to obtain your test results. Ask your health care provider, or the department that is doing the test: ? When will my  results be ready? ? How will I get my results? ? What are my treatment options? ? What other tests do I need? ? What are my next steps?  Keep all follow-up visits as told by your health care provider. This is important. Contact a health care provider if:  You have increased bleeding from an incision, resulting in more than a small spot of blood.  You have redness, swelling, or increasing pain in any incisions.  You notice a discharge or a bad smell coming from any of your incisions.  You have a fever or  chills. Get help right away if:  You develop swelling, bloating, or pain in your abdomen.  You become dizzy or faint.  You develop a rash.  You have nausea or you vomit.  You faint, or you have shortness of breath or difficulty breathing.  You develop chest pain.  You have problems with your speech or vision.  You have trouble with your balance or moving your arms or legs. Summary  After the liver biopsy, it is common to have pain, soreness, and bruising in the area, as well as sleepiness and fatigue.  Take over-the-counter and prescription medicines only as told by your health care provider.  Follow instructions from your health care provider about how to care for your incision. Check the incision area daily for signs of infection. This information is not intended to replace advice given to you by your health care provider. Make sure you discuss any questions you have with your health care provider. Document Released: 11/23/2004 Document Revised: 05/16/2017 Document Reviewed: 05/16/2017 Elsevier Interactive Patient Education  2019 [place]. Moderate Conscious Sedation, Adult, Care After These instructions provide you with information about caring for yourself after your procedure. Your health care provider may also give you more specific instructions. Your treatment has been planned according to current medical practices, but problems sometimes occur. Call your health care provider if you have any problems or questions after your procedure. What can I expect after the procedure? After your procedure, it is common:  To feel sleepy for several hours.  To feel clumsy and have poor balance for several hours.  To have poor judgment for several hours.  To vomit if you eat too soon. Follow these instructions at home: For at least 24 hours after the procedure:   Do not: ? Participate in activities where you could fall or become injured. ? Drive. ? Use heavy  machinery. ? Drink alcohol. ? Take sleeping pills or medicines that cause drowsiness. ? Make important decisions or sign legal documents. ? Take care of children on your own.  Rest. Eating and drinking  Follow the diet recommended by your health care provider.  If you vomit: ? Drink water, juice, or soup when you can drink without vomiting. ? Make sure you have little or no nausea before eating solid foods. General instructions  Have a responsible adult stay with you until you are awake and alert.  Take over-the-counter and prescription medicines only as told by your health care provider.  If you smoke, do not smoke without supervision.  Keep all follow-up visits as told by your health care provider. This is important. Contact a health care provider if:  You keep feeling nauseous or you keep vomiting.  You feel light-headed.  You develop a rash.  You have a fever. Get help right away if:  You have trouble breathing. This information is not intended to replace advice given to you by your  health care provider. Make sure you discuss any questions you have with your health care provider. Document Released: 02/24/2013 Document Revised: 10/09/2015 Document Reviewed: 08/26/2015 Elsevier Interactive Patient Education  2019 [employer].

## 2018-07-17 NOTE — H&P (Signed)
Chief Complaint: Patient was seen in consultation today for transjugular liver core biopsy at the request of Gessner,Carl E  Referring Physician(s): Silvano Rusk E  Supervising Physician: Arne Cleveland  Patient Status: [facility] - Out-pt  History of Present Illness: Zachary Lewis is a 58 y.o. male   Jaundice Alcoholic cirrhosis Ascites- 4 L paracentesis 07/12/18  Dr Carlean Purl note: Alcoholic liver disease with likely contribution of nonalcoholic fatty liver disease as well.  He has a markedly elevated ferritin above 9381 which is certainly possible with alcoholic liver disease.  We see no signs of autoimmune disease based upon serologies and his IgG level.  The current pattern looks more like a cholestatic pattern with a markedly elevated alkaline phosphatase and bilirubin.  He is having a little bit of pruritus.  At this point he is going to need a liver biopsy.  Also note that I am not aware of any supplement usage but may need to requestion him on that.  Liver biopsy is problematic with the massive ascites.  Transjugular biopsy is probably is necessary.  Scheduled now for same  Past Medical History:  Diagnosis Date  . Diabetes mellitus    Type II  . Hyperlipidemia   . Hypertension   . Obesity   . Personal history of colonic adenomas 03/09/2013  . Tinea pedis    Chronic    Past Surgical History:  Procedure Laterality Date  . COLONOSCOPY W/ POLYPECTOMY  2014  . IR PARACENTESIS  07/02/2018  . myringectomy  06/2005  . UMBILICAL HERNIA REPAIR  01/1997  . VASECTOMY  07/2002    Allergies: Patient has no known allergies.  Medications: Prior to Admission medications   Medication Sig Start Date End Date Taking? Authorizing Provider  folic acid (FOLVITE) 1 MG tablet Take 1 tablet (1 mg total) by mouth daily. 06/02/18  Yes Thurnell Lose, MD  furosemide (LASIX) 40 MG tablet Take 1 tablet (40 mg total) by mouth daily. 07/03/18  Yes Gatha Mayer, MD  metFORMIN  (GLUCOPHAGE) 500 MG tablet Take 1 tablet (500 mg total) by mouth 2 (two) times daily with a meal. 06/05/18  Yes Tonia Ghent, MD  potassium chloride (KLOR-CON 10) 10 MEQ tablet Take 1 tablet (10 mEq total) by mouth daily. 06/25/18  Yes Tower, Wynelle Fanny, MD  spironolactone (ALDACTONE) 50 MG tablet Take 1 tablet (50 mg total) by mouth daily. 06/25/18  Yes Tower, Wynelle Fanny, MD  thiamine 100 MG tablet Take 1 tablet (100 mg total) by mouth daily. 06/02/18  Yes Thurnell Lose, MD  Blood Glucose Calibration (ONETOUCH VERIO) SOLN Use to calibrate meter 07/07/18   Tower, Wynelle Fanny, MD  Blood Glucose Monitoring Suppl (BLOOD GLUCOSE METER) kit To check glucose once daily and as needed for diabetes 10/30/11   Tower, Wynelle Fanny, MD  Blood Glucose Monitoring Suppl (ONETOUCH VERIO) w/Device KIT Use to check blood sugar once daily and as needed for DM (dx. E11.65) 07/07/18   Tower, Wynelle Fanny, MD  glipiZIDE (GLUCOTROL XL) 10 MG 24 hr tablet Take 1 tablet (10 mg total) by mouth daily. Patient not taking: Reported on 07/13/2018 06/05/18   Tonia Ghent, MD  glucose blood [facility] VERIO) test strip Use to check blood sugar once daily and as needed for DM (dx. E11.65) 07/07/18   Tower, Wynelle Fanny, MD  [facility] DELICA LANCETS 82X MISC 1 each by Does not apply route daily as needed. Use to check blood sugar once daily and as needed for DM (  dx. E11.65) 07/07/18   Tower, Wynelle Fanny, MD  potassium chloride SA (K-DUR,KLOR-CON) 20 MEQ tablet Take 2 tablets (40 mEq total) by mouth once for 1 dose. 06/19/18 07/02/18  Tonia Ghent, MD  simvastatin (ZOCOR) 20 MG tablet Held as of 06/05/2018 Patient not taking: Reported on 07/13/2018 06/05/18   Tonia Ghent, MD     Family History  Problem Relation Age of Onset  . Hypertension Other   . Diabetes Neg Hx   . Heart disease Neg Hx   . Colon cancer Neg Hx     Social History   Socioeconomic History  . Marital status: Married    Spouse name: Not on file  . Number of children: Not on file  .  Years of education: Not on file  . Highest education level: Not on file  Occupational History  . Occupation: [employer]    Employer: [employer]  Social Needs  . Financial resource strain: Not on file  . Food insecurity:    Worry: Not on file    Inability: Not on file  . Transportation needs:    Medical: Not on file    Non-medical: Not on file  Tobacco Use  . Smoking status: Current Every Day Smoker    Packs/day: 0.25  . Smokeless tobacco: Never Used  Substance and Sexual Activity  . 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 Drug use: No  . Sexual activity: Not on file  Lifestyle  . Physical activity:    Days per week: Not on file    Minutes per session: Not on file  . Stress: Not on file  Relationships  . Social connections:    Talks on phone: Not on file    Gets together: Not on file    Attends religious service: Not on file    Active member of club or organization: Not on file    Attends meetings of clubs or organizations: Not on file    Relationship status: Not on file  Other Topics Concern  . Not on file  Social History Narrative   Married, works at [employer], heavy drinking of Canadian mist alcohol   No drug use    Review of Systems: A 12 point ROS discussed and pertinent positives are indicated in the HPI above.  All other systems are negative.  Review of Systems  Constitutional: Positive for fatigue. Negative for activity change.  Respiratory: Negative for shortness of breath.   Gastrointestinal: Positive for abdominal distention. Negative for abdominal pain and nausea.  Skin:       Pruritis   Neurological: Negative for weakness.  Psychiatric/Behavioral: Negative for behavioral problems and confusion.    Vital Signs: BP 104/78   Pulse 82   Temp 98 F (36.7 C) (Oral)   Resp 16   Ht 5' 6" (1.676 m)   Wt 180 lb (81.6 kg)   SpO2 100%   BMI 29.05 kg/m   Physical  Exam Vitals signs reviewed.  Eyes:     General: Scleral icterus present.  Cardiovascular:     Rate and Rhythm: Normal rate and regular rhythm.     Heart sounds: Normal heart sounds.  Pulmonary:     Effort: Pulmonary effort is normal.     Breath sounds: Normal breath sounds.  Abdominal:     General: Bowel sounds are normal. There is distension.  Tenderness: There is no abdominal tenderness.  Musculoskeletal: Normal range of motion.  Skin:    General: Skin is warm and dry.  Neurological:     Mental Status: He is alert and oriented to person, place, and time.  Psychiatric:        Mood and Affect: Mood normal.        Behavior: Behavior normal.        Thought Content: Thought content normal.        Judgment: Judgment normal.     Imaging: Ir Paracentesis  Result Date: 07/02/2018 INDICATION: Cirrhosis. Abdominal distention secondary to ascites. Request diagnostic and therapeutic paracentesis. EXAM: ULTRASOUND GUIDED RIGHT LOWER QUADRANT PARACENTESIS MEDICATIONS: None. COMPLICATIONS: None immediate. PROCEDURE: Informed written consent was obtained from the patient after a discussion of the risks, benefits and alternatives to treatment. A timeout was performed prior to the initiation of the procedure. Initial ultrasound scanning demonstrates a large amount of ascites within the right lower abdominal quadrant. The right lower abdomen was prepped and draped in the usual sterile fashion. 1% lidocaine with epinephrine was used for local anesthesia. Following this, a 19 gauge, 10-cm, Yueh catheter was introduced. An ultrasound image was saved for documentation purposes. The paracentesis was performed. The catheter was removed and a dressing was applied. The patient tolerated the procedure well without immediate post procedural complication. FINDINGS: A total of approximately 4 L of clear yellow fluid was removed. Samples were sent to the laboratory as requested by the clinical team. IMPRESSION:  Successful ultrasound-guided paracentesis yielding 4 liters of peritoneal fluid. Read by: Ascencion Dike PA-C Electronically Signed   By: Markus Daft M.D.   On: 07/02/2018 15:18    Labs:  CBC: Recent Labs    06/05/18 1313 06/19/18 1054 06/24/18 1042 07/17/18 0730  WBC 17.8* 19.3 Repeated and verified X2.* 19.4 Repeated and verified X2.* 15.7*  HGB 12.5* 12.1* 12.2* 12.6*  HCT 33.9* 35.6* 36.2* 38.1*  PLT 327 358.0 308.0 298    COAGS: Recent Labs    05/30/18 0535 07/02/18 1102  INR 1.12 1.3*  APTT 30  --     BMP: Recent Labs    05/29/18 2109 05/30/18 0535 06/01/18 0427  06/19/18 1054 06/24/18 1042 07/02/18 1102 07/13/18 1406  NA  --  134* 132*   < > 135 137 133* 135  K  --  2.5* 3.1*   < > 2.7* 3.0* 3.7 4.3  CL  --  93* 94*   < > 94* 97 99 100  CO2  --  28 26   < > _0 GLUCOSE  --  205* 123*   < > 136* 87 204* 195*  BUN  --  20 12   < > 28* _1 CALCIUM  --  8.3* 8.0*   < > 8.7 8.6 8.5 8.8  CREATININE 0.99 1.31* 0.70   < > 0.78 0.76 0.71 0.64  GFRNONAA >60 60* >60  --   --   --   --   --   GFRAA >60 >60 >60  --   --   --   --   --    < > = values in this interval not displayed.    LIVER FUNCTION TESTS: Recent Labs    06/19/18 1054 06/24/18 1042 07/02/18 1102 07/13/18 1406  BILITOT 9.0* 9.3* 7.8* 5.4*  AST 84* 75* 59* 45*  ALT 34 29 31 34  ALKPHOS 703* 869* 988* 1,131*  PROT 6.7  6.9 7.3 7.4  ALBUMIN 2.6* 2.6*  2.6* 2.8* 2.9*    TUMOR MARKERS: No results for input(s): AFPTM, CEA, CA199, CHROMGRNA in the last 8760 hours.  Assessment and Plan:  Alcoholic liver disease Ascites Previous paracentesis Scheduled for Transjugular liver biopsy per Dr Carlean Purl Risks and benefits of transjugular liver core biopsy was discussed with the patient and/or patient's family including, but not limited to bleeding, infection, damage to adjacent structures or low yield requiring additional tests.  All of the questions were answered and there is  agreement to proceed. Consent signed and in chart  Thank you for this interesting consult.  I greatly enjoyed meeting DERREN SUYDAM and look forward to participating in their care.  A copy of this report was sent to the requesting provider on this date.  Electronically Signed: Lavonia Drafts, PA-C 07/17/2018, 8:07 AM   I spent a total of  30 Minutes   in face to face in clinical consultation, greater than 50% of which was counseling/coordinating care for [facility]J liver biopsy

## 2018-07-17 NOTE — Procedures (Signed)
  Procedure: Hepatic venogram, Transjug liver core biopsy 18g x3 EBL:   minimal Complications:  none immediate  See full dictation in [facility].  Dillard Cannon MD Main # [phone] Pager  [phone]

## 2018-07-22 ENCOUNTER — Other Ambulatory Visit: Payer: Self-pay

## 2018-07-22 DIAGNOSIS — R188 Other ascites: Principal | ICD-10-CM

## 2018-07-22 DIAGNOSIS — K7460 Unspecified cirrhosis of liver: Secondary | ICD-10-CM

## 2018-07-22 DIAGNOSIS — K709 Alcoholic liver disease, unspecified: Secondary | ICD-10-CM

## 2018-07-22 MED ORDER — URSODIOL 300 MG PO CAPS: 300.0000 mg | ORAL_CAPSULE | Freq: Two times a day (BID) | ORAL | 3 refills | Status: DC

## 2018-07-22 MED ORDER — CHOLESTYRAMINE LIGHT 4 G PO PACK: 4.0000 g | PACK | Freq: Two times a day (BID) | ORAL | 3 refills | Status: DC

## 2018-07-22 NOTE — Progress Notes (Signed)
Please call the patient/wife She has messaged that he is having worsening itching and asked about bx results   1) Liver biopsy shows cirrhosis - from fatty liver which is probably mostly from alcohol but can be from other causes (STAY OFF EtOH) 2) For itching try cholestyramine light 1 packet bid # 60 packets 3 RF 3) Also for Itching ursodeoxycholic acid 557 mg bid (needs to take this 2 hours before or 4 hrs after cholestyramine) 4) he needs to have different diabetes Tx as glipizide generally avoided in cirrhosis (am ccing PCP) 5) refer to [facility]) cirrhosis 6) I will explain more at 3/9 visit

## 2018-07-23 ENCOUNTER — Other Ambulatory Visit: Payer: Self-pay

## 2018-07-27 ENCOUNTER — Ambulatory Visit: Payer: Managed Care, Other (non HMO) | Admitting: Internal Medicine

## 2018-08-04 ENCOUNTER — Other Ambulatory Visit: Payer: Self-pay

## 2018-08-04 ENCOUNTER — Encounter: Payer: Self-pay | Admitting: Internal Medicine

## 2018-08-04 ENCOUNTER — Other Ambulatory Visit: Payer: Self-pay | Admitting: Family Medicine

## 2018-08-04 ENCOUNTER — Other Ambulatory Visit (INDEPENDENT_AMBULATORY_CARE_PROVIDER_SITE_OTHER): Payer: Managed Care, Other (non HMO)

## 2018-08-04 ENCOUNTER — Ambulatory Visit (INDEPENDENT_AMBULATORY_CARE_PROVIDER_SITE_OTHER): Payer: Managed Care, Other (non HMO) | Admitting: Internal Medicine

## 2018-08-04 VITALS — BP 114/82 | HR 104 | Temp 98.0°F | Ht 66.0 in | Wt 179.0 lb

## 2018-08-04 DIAGNOSIS — E1165 Type 2 diabetes mellitus with hyperglycemia: Secondary | ICD-10-CM

## 2018-08-04 DIAGNOSIS — L298 Other pruritus: Secondary | ICD-10-CM | POA: Diagnosis not present

## 2018-08-04 DIAGNOSIS — K7460 Unspecified cirrhosis of liver: Secondary | ICD-10-CM

## 2018-08-04 DIAGNOSIS — R188 Other ascites: Secondary | ICD-10-CM | POA: Diagnosis not present

## 2018-08-04 DIAGNOSIS — Z86010 Personal history of colonic polyps: Secondary | ICD-10-CM | POA: Diagnosis not present

## 2018-08-04 HISTORY — DX: Unspecified cirrhosis of liver: K74.60

## 2018-08-04 HISTORY — DX: Other ascites: R18.8

## 2018-08-04 LAB — COMPREHENSIVE METABOLIC PANEL WITH GFR
ALT: 28 U/L (ref 0–53)
AST: 30 U/L (ref 0–37)
Albumin: 4 g/dL (ref 3.5–5.2)
Alkaline Phosphatase: 541 U/L — ABNORMAL HIGH (ref 39–117)
BUN: 11 mg/dL (ref 6–23)
CO2: 24 meq/L (ref 19–32)
Calcium: 9.8 mg/dL (ref 8.4–10.5)
Chloride: 101 meq/L (ref 96–112)
Creatinine, Ser: 0.56 mg/dL (ref 0.40–1.50)
GFR: 180.73 mL/min (ref >60.00)
Glucose, Bld: 253 mg/dL — ABNORMAL HIGH (ref 70–99)
Potassium: 3.8 meq/L (ref 3.5–5.1)
Sodium: 134 meq/L — ABNORMAL LOW (ref 135–145)
Total Bilirubin: 2.7 mg/dL — ABNORMAL HIGH (ref 0.2–1.2)
Total Protein: 8.8 g/dL — ABNORMAL HIGH (ref 6.0–8.3)

## 2018-08-04 MED ORDER — POTASSIUM CHLORIDE ER 10 MEQ PO TBCR: 10.0000 meq | EXTENDED_RELEASE_TABLET | Freq: Every day | ORAL | 1 refills | Status: DC

## 2018-08-04 NOTE — Progress Notes (Signed)
Zachary Lewis 58 y.o. 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  Cannot distinguish whether this is [employer] or both. He is now abstinent from alcohol. Serologies to see if he is immune to hepatitis A or B or C are drawn today.  He may need vaccinations for this. Repeat comprehensive metabolic panel. He needs a screening EGD but due to the COVID-19 outbreak and issues we will defer this. He is to call the [facility] and be seen.  Cholestatic pruritus This is better on cholestyramine and ursodiol.  In my experience this is unusual in steatohepatitis.  Personal history of colonic adenomas Still needs a follow-up colonoscopy but not important right now and in the setting of the COVID-19 problems we are experiencing with defer this.  Diabetes type 2, uncontrolled ([place]) His blood sugars have remained high despite what he thinks based upon labs I see.  He will either need increased metformin or insulin or both I suspect.  Nonfasting glucose was 253 today.  I appreciate the opportunity to care for this patient. CC: Tower, Wynelle Fanny, MD Roosevelt Locks, NP    Subjective:   Chief Complaint: Cirrhosis follow-up after biopsy  HPI Mr. Deloatch is here with his wife, he had a liver biopsy on February 28 demonstrating severely active steatohepatitis with Mallory bodies and cirrhosis.  He was a regular user of significant alcohol and also has diabetes and metabolic syndrome.  He called back at 1 point complaining of itching and I started him on cholestyramine and ursodiol.  In the past 3 or 4 days the pruritus has improved.  He was to be seen earlier but unfortunately his father passed away recently.  Since his hospitalization in January we had the flu and we found his liver problems he has stopped drinking  alcohol.  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  He was urinating too much so he stopped his furosemide.  He did have a paracentesis back in February as well.  His weight is down as was reflected below and seems to have leveled off.  He is to be seen in the [facility] in [place], he received a call but that was during the period of his father's death and funeral and he has not called back.  Wt Readings from Last 3 Encounters:  08/04/18 179 lb (81.2 kg)  07/17/18 180 lb (81.6 kg)  07/02/18 218 lb (98.9 kg)   He says his blood sugars are okay.  His primary care provider discontinued glipizide at my direction because of his cirrhosis and that drug is contraindicated in cirrhosis.  He continues on metformin. No Known Allergies Current Meds  Medication Sig   Blood Glucose Calibration (ONETOUCH VERIO) SOLN Use to calibrate meter   Blood Glucose Monitoring Suppl (BLOOD GLUCOSE METER) kit To check glucose once daily and as needed for diabetes   Blood Glucose Monitoring Suppl (ONETOUCH VERIO) w/Device KIT Use to check blood sugar once daily and as needed for DM (dx. E11.65)   cholestyramine light (PREVALITE) 4 g packet Take 1 packet (4 g total) by mouth 2 (two) times daily.   glucose blood (ONETOUCH VERIO) test strip Use to check blood sugar once daily and as needed for DM (dx. E11.65)  metFORMIN (GLUCOPHAGE) 500 MG tablet Take 1 tablet (500 mg total) by mouth 2 (two) times daily with a meal.   ONETOUCH DELICA LANCETS 49Q MISC 1 each by Does not apply route daily as needed. Use to check blood sugar once daily and as needed for DM (dx. E11.65)   potassium chloride (KLOR-CON 10) 10 MEQ tablet Take 1 tablet (10 mEq total) by mouth daily.   spironolactone (ALDACTONE) 50 MG tablet Take 1 tablet (50 mg total) by mouth daily.   thiamine 100 MG tablet Take 1 tablet (100 mg total) by mouth daily.   ursodiol (ACTIGALL) 300 MG capsule  Take 1 capsule (300 mg total) by mouth 2 (two) times daily. Do not take 2 hours before or 4 hours after cholestyramine   [DISCONTINUED] folic acid (FOLVITE) 1 MG tablet Take 1 tablet (1 mg total) by mouth daily.   [DISCONTINUED] furosemide (LASIX) 40 MG tablet Take 1 tablet (40 mg total) by mouth daily.   [DISCONTINUED] simvastatin (ZOCOR) 20 MG tablet Held as of 06/05/2018   Past Medical History:  Diagnosis Date   Diabetes mellitus    Type II   Hyperlipidemia    Hypertension    Obesity    Personal history of colonic adenomas 03/09/2013   Tinea pedis    Chronic   Past Surgical History:  Procedure Laterality Date   COLONOSCOPY W/ POLYPECTOMY  2014   IR PARACENTESIS  07/02/2018   IR TRANSCATHETER BX  07/17/2018   IR VENOGRAM HEPATIC WO HEMODYNAMIC EVALUATION  07/17/2018   myringectomy  75/9163   UMBILICAL HERNIA REPAIR  01/1997   VASECTOMY  07/2002   Social History   Social History Narrative   Married, works at [employer], heavy drinking of [place] mist alcohol   No drug use   family history includes Hypertension in an other family member.   Review of Systems See HPI  Objective:   Physical Exam BP 114/82    Pulse (!) 104    Temp 98 F (36.7 C)    Ht 5' 6"  (1.676 m)    Wt 179 lb (81.2 kg)    BMI 28.89 kg/m  NAD Mild scleral icterus Lungs cta Cor NL abd obese liver palp in the right upper quadrant about 3 fingerbreadths down.  I cannot tell if he has ascites it is possible. Ext no edema Neuro he is alert and oriented x3 and does not have asterixis

## 2018-08-04 NOTE — Assessment & Plan Note (Signed)
Still needs a follow-up colonoscopy but not important right now and in the setting of the COVID-19 problems we are experiencing with defer this.

## 2018-08-04 NOTE — Assessment & Plan Note (Signed)
This is better on cholestyramine and ursodiol.  In my experience this is unusual in steatohepatitis.

## 2018-08-04 NOTE — Progress Notes (Signed)
My Chart message to patient Liver labs getting better  Blood sugar too high  Is going to need to see Dr. Glori Bickers about increasing metformin vs adding new meds

## 2018-08-04 NOTE — Patient Instructions (Signed)
Please contact [facility] at phone # [phone]  To get an appointment.   Your provider has requested that you go to the basement level for lab work before leaving today. Press "B" on the elevator. The lab is located at the first door on the left as you exit the elevator. We will contact you with results and plans.   We are giving you a handout to read on cirrhosis.   Very sorry for the loss of your Father.   I appreciate the opportunity to care for you. Silvano Rusk, MD, [facility]

## 2018-08-04 NOTE — Assessment & Plan Note (Signed)
His blood sugars have remained high despite what he thinks based upon labs I see.  He will either need increased metformin or insulin or both I suspect.  Nonfasting glucose was 253 today.

## 2018-08-04 NOTE — Assessment & Plan Note (Addendum)
Biopsies from February 28 liver biopsy demonstrate severe steatohepatitis grade 3 of 3 and fibrosis/cirrhosis grade 4 of 4.  Cannot distinguish whether this is [employer] or both. He is now abstinent from alcohol. Serologies to see if he is immune to hepatitis A or B or C are drawn today.  He may need vaccinations for this. Repeat comprehensive metabolic panel. He needs a screening EGD but due to the COVID-19 outbreak and issues we will defer this. He is to call the [facility] and be seen.

## 2018-08-05 ENCOUNTER — Other Ambulatory Visit: Payer: Self-pay | Admitting: Internal Medicine

## 2018-08-05 DIAGNOSIS — R188 Other ascites: Principal | ICD-10-CM

## 2018-08-05 DIAGNOSIS — K7460 Unspecified cirrhosis of liver: Secondary | ICD-10-CM

## 2018-08-05 LAB — HEPATITIS B SURFACE ANTIBODY,QUALITATIVE: Hep B S Ab: NONREACTIVE

## 2018-08-05 LAB — HEPATITIS C ANTIBODY
Hepatitis C Ab: NONREACTIVE
SIGNAL TO CUT-OFF: 0.04 (ref <1.00)

## 2018-08-05 LAB — HEPATITIS A ANTIBODY, TOTAL: Hepatitis A AB,Total: NONREACTIVE

## 2018-08-05 LAB — HEPATITIS B CORE ANTIBODY, TOTAL: Hep B Core Total Ab: NONREACTIVE

## 2018-08-05 NOTE — Progress Notes (Signed)
No immunity to hepatitis A or B Has not had hepatitis C  Will need vaccines but will not bring in now with Covid-19 pandemic  To see [facility]  My chart message  Will have him repeat LFT April 6 - orders entered

## 2018-08-07 ENCOUNTER — Telehealth: Payer: Self-pay | Admitting: *Deleted

## 2018-08-07 ENCOUNTER — Telehealth: Payer: Self-pay

## 2018-08-07 NOTE — Telephone Encounter (Signed)
Left VM requesting pt to call the office back regarding Dr.Tower's comments on his labs from GI

## 2018-08-07 NOTE — Telephone Encounter (Signed)
Information re-faxed to [facility]. [facility] had already done this referral and they were going to reach out to the patient to set up appointment. We wonder if they were unable to reach him and discarded the referral.

## 2018-08-11 ENCOUNTER — Telehealth: Payer: Self-pay | Admitting: *Deleted

## 2018-08-11 MED ORDER — SITAGLIPTIN PHOSPHATE 100 MG PO TABS: 100.0000 mg | ORAL_TABLET | Freq: Every day | ORAL | 5 refills | Status: DC

## 2018-08-11 NOTE — Telephone Encounter (Signed)
Pt notified of Dr. Marliss Coots instructions and recommendations and verbalized understanding. Rx sent to pharmacy

## 2018-08-11 NOTE — Telephone Encounter (Signed)
-----   Message from Abner Greenspan, MD sent at 08/06/2018  7:27 PM EDT ----- I want to start him on Januvia if it is affordable januvia 100 mg 1 po QD #30 5 refills Watch for hypoglycemia Eat a DM diet best you can  Stop /alert me if side effects  Let me know by phone how blood glucose readings are in 2 weeks

## 2018-08-12 NOTE — Telephone Encounter (Signed)
Addressed in result notes  

## 2018-08-14 NOTE — Telephone Encounter (Signed)
Per [facility] the first referral went to the [place] office. They forwarded the information to the [place] office and it is being reviewed. They will contact us back with appointment information.

## 2018-08-14 NOTE — Telephone Encounter (Signed)
Patient has been made an appointment for 09/16/2018 at 2:15pm.

## 2018-08-21 ENCOUNTER — Other Ambulatory Visit: Payer: Managed Care, Other (non HMO)

## 2018-08-26 ENCOUNTER — Encounter: Payer: Self-pay | Admitting: Family Medicine

## 2018-08-26 ENCOUNTER — Other Ambulatory Visit: Payer: Self-pay | Admitting: Family Medicine

## 2018-08-31 ENCOUNTER — Telehealth: Payer: Self-pay

## 2018-08-31 NOTE — Telephone Encounter (Signed)
Pt said on 08/29/18 suddenly began with numbness at front and top of leg that the numbness goes down lt leg 6 - 8". Feels some soreness but no redness, warmth or swelling. No difficulty walking,no weakness in lt side or extremities.No CP or SOB. No fever, cough,SOB, no travel and no known exposure to covid or flu. Pt has not drank alcohol since getting out of hospital in Jan. Pt scheduled appt with Dr Glori Bickers 09/01/18 at 8:30. ED precautions for stroke given and pt voiced understanding.FYI to Dr Glori Bickers.

## 2018-09-01 ENCOUNTER — Encounter: Payer: Self-pay | Admitting: Family Medicine

## 2018-09-01 ENCOUNTER — Ambulatory Visit (INDEPENDENT_AMBULATORY_CARE_PROVIDER_SITE_OTHER): Payer: Managed Care, Other (non HMO) | Admitting: Family Medicine

## 2018-09-01 ENCOUNTER — Encounter: Payer: Managed Care, Other (non HMO) | Admitting: Family Medicine

## 2018-09-01 ENCOUNTER — Other Ambulatory Visit: Payer: Self-pay

## 2018-09-01 VITALS — BP 132/86 | HR 81 | Temp 98.3°F | Ht 66.0 in | Wt 177.2 lb

## 2018-09-01 DIAGNOSIS — G5712 Meralgia paresthetica, left lower limb: Secondary | ICD-10-CM | POA: Diagnosis not present

## 2018-09-01 DIAGNOSIS — K7460 Unspecified cirrhosis of liver: Secondary | ICD-10-CM

## 2018-09-01 DIAGNOSIS — F17200 Nicotine dependence, unspecified, uncomplicated: Secondary | ICD-10-CM | POA: Diagnosis not present

## 2018-09-01 DIAGNOSIS — E1165 Type 2 diabetes mellitus with hyperglycemia: Secondary | ICD-10-CM | POA: Diagnosis not present

## 2018-09-01 DIAGNOSIS — R188 Other ascites: Secondary | ICD-10-CM

## 2018-09-01 DIAGNOSIS — G5710 Meralgia paresthetica, unspecified lower limb: Secondary | ICD-10-CM | POA: Insufficient documentation

## 2018-09-01 LAB — POCT GLYCOSYLATED HEMOGLOBIN (HGB A1C): Hemoglobin A1C: 8.3 % — AB (ref 4.0–5.6)

## 2018-09-01 MED ORDER — METFORMIN HCL 1000 MG PO TABS: 1000.0000 mg | ORAL_TABLET | Freq: Two times a day (BID) | ORAL | 1 refills | Status: DC

## 2018-09-01 NOTE — Progress Notes (Signed)
Subjective:    Patient ID: Zachary Lewis, male    DOB: January 04, 1961, 58 y.o.   MRN: [id number]  HPI  Here for complaint of L anterolateral leg numbness   Wt Readings from Last 3 Encounters:  09/01/18 177 lb 4 oz (80.4 kg)  08/04/18 179 lb (81.2 kg)  07/17/18 180 lb (81.6 kg)   28.61 kg/m   This started on Sat afternoon  He had hit his leg on a table at work  It hurt at the time  Then it started feeling numb that night  It is over anterolateral thigh  Some ache in the groin - this am when driving  If he lies on the L side it is worse  It has tingled at times  No weakness at all  No rash at all  Back does not hurt   Cirrhosis -waiting for a liver clinic appointment  He is still working /is isolated in his office (can watch a monitor)   Diabetes 2 Lab Results  Component Value Date   HGBA1C 6.2 06/19/2018   Glucose has been high after d/c of glipizide at adv of his GI (due to cirrhosis) He takes metformin 500 mg bid   In am glucose is 180s or higher (occ 200)  Later in the day - low 200s  Diet is very good  He is loosing weight also  Staying away from sweets/sugar  Avoids pasta occ bread   Starting back with exercise No etoh at all   We px [place]- is $   Taking metformin 500 bid   Smoking status - barely smoking  4 cig per day  Ready to quit on his own   Lab Results  Component Value Date   CREATININE 0.56 08/04/2018   BUN 11 08/04/2018   NA 134 (L) 08/04/2018   K 3.8 08/04/2018   CL 101 08/04/2018   CO2 24 08/04/2018   Patient Active Problem List   Diagnosis Date Noted   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/2018   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[place]) 08/04/2018   Cholestatic pruritus 08/04/2018   Hypokalemia 57/84/6962   Alcoholic hepatitis    Alcoholic liver disease ([place])    Jaundice    Fatigue 05/29/2018   Scleral icterus 05/29/2018   Exposure to the flu 05/29/2018   Transaminitis 05/29/2018     Alcohol use 05/29/2018   Smoking 01/04/2014   Personal history of colonic adenomas 03/09/2013   Prostate cancer screening 12/07/2012   Routine general medical examination at a health care facility 08/23/2010   HYPERTENSION, BENIGN ESSENTIAL 01/05/2010   BACK PAIN, LUMBAR, WITH RADICULOPATHY 09/13/2008   Overweight (BMI 25.0-29.9) 09/08/2007   TINEA PEDIS 07/16/2007   Diabetes type 2, uncontrolled ([place]) 08/13/2006   Hyperlipidemia 08/13/2006   ALLERGY 08/13/2006   Fatty liver 08/13/2006   Past Medical History:  Diagnosis Date   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[place]) 08/04/2018   Diabetes mellitus    Type II   Hyperlipidemia    Hypertension    Obesity    Personal history of colonic adenomas 03/09/2013   Tinea pedis    Chronic   Past Surgical History:  Procedure Laterality Date   COLONOSCOPY W/ POLYPECTOMY  2014   IR PARACENTESIS  07/02/2018   IR TRANSCATHETER BX  07/17/2018   IR VENOGRAM HEPATIC WO HEMODYNAMIC EVALUATION  07/17/2018   myringectomy  95/2841   UMBILICAL HERNIA REPAIR  01/1997  VASECTOMY  07/2002   Social History   Tobacco Use   Smoking status: Current Every Day Smoker    Packs/day: 0.25   Smokeless tobacco: Never Used  Substance Use Topics   Alcohol use: Not Currently    Comment: Heavy use of [place] daily most of the time/ stopped drinking 05-2018    Drug use: No   Family History  Problem Relation Age of Onset   Hypertension Other    Diabetes Neg Hx    Heart disease Neg Hx    Colon cancer Neg Hx    Stomach cancer Neg Hx    Pancreatic disease Neg Hx    No Known Allergies Current Outpatient Medications on File Prior to Visit  Medication Sig Dispense Refill   Blood Glucose Calibration (ONETOUCH VERIO) SOLN Use to calibrate meter 1 each 3   Blood Glucose Monitoring Suppl (BLOOD GLUCOSE METER) kit To check glucose once daily and as needed for diabetes 1 each 0   Blood Glucose  Monitoring Suppl (ONETOUCH VERIO) w/Device KIT Use to check blood sugar once daily and as needed for DM (dx. E11.65) 1 kit 0   cholestyramine light (PREVALITE) 4 g packet Take 1 packet (4 g total) by mouth 2 (two) times daily. 60 packet 3   glucose blood (ONETOUCH VERIO) test strip Use to check blood sugar once daily and as needed for DM (dx. E11.65) 100 each 3   ONETOUCH DELICA LANCETS 49Z MISC 1 each by Does not apply route daily as needed. Use to check blood sugar once daily and as needed for DM (dx. E11.65) 100 each 3   potassium chloride (KLOR-CON 10) 10 MEQ tablet Take 1 tablet (10 mEq total) by mouth daily. 90 tablet 1   sitaGLIPtin (JANUVIA) 100 MG tablet Take 1 tablet (100 mg total) by mouth daily. 30 tablet 5   spironolactone (ALDACTONE) 50 MG tablet TAKE 1 TABLET(50 MG) BY MOUTH DAILY 30 tablet 2   thiamine 100 MG tablet Take 1 tablet (100 mg total) by mouth daily. 30 tablet 0   ursodiol (ACTIGALL) 300 MG capsule Take 1 capsule (300 mg total) by mouth 2 (two) times daily. Do not take 2 hours before or 4 hours after cholestyramine 60 capsule 3   No current facility-administered medications on file prior to visit.      Review of Systems  Constitutional: Negative for activity change, appetite change, fatigue, fever and unexpected weight change.  HENT: Negative for congestion, rhinorrhea, sore throat and trouble swallowing.   Eyes: Negative for pain, redness, itching and visual disturbance.  Respiratory: Negative for cough, chest tightness, shortness of breath and wheezing.   Cardiovascular: Negative for chest pain and palpitations.  Gastrointestinal: Negative for abdominal pain, blood in stool, constipation, diarrhea and nausea.  Endocrine: Negative for cold intolerance, heat intolerance, polydipsia and polyuria.  Genitourinary: Negative for difficulty urinating, dysuria, frequency and urgency.  Musculoskeletal: Negative for arthralgias, gait problem, joint swelling and  myalgias.  Skin: Negative for pallor and rash.  Neurological: Positive for numbness. Negative for dizziness, tremors, seizures, syncope, facial asymmetry, speech difficulty, weakness, light-headedness and headaches.  Hematological: Negative for adenopathy. Does not bruise/bleed easily.  Psychiatric/Behavioral: Negative for decreased concentration and dysphoric mood. The patient is not nervous/anxious.        Objective:   Physical Exam Constitutional:      General: He is not in acute distress.    Appearance: Normal appearance. He is not ill-appearing.     Comments: Well appearing  Recent weight loss noted  HENT:     Head: Normocephalic and atraumatic.     Mouth/Throat:     Mouth: Mucous membranes are moist.     Pharynx: Oropharynx is clear.  Eyes:     Extraocular Movements: Extraocular movements intact.     Conjunctiva/sclera: Conjunctivae normal.     Pupils: Pupils are equal, round, and reactive to light.  Neck:     Musculoskeletal: Normal range of motion and neck supple.     Vascular: No carotid bruit.  Cardiovascular:     Rate and Rhythm: Normal rate and regular rhythm.     Pulses: Normal pulses.     Heart sounds: Normal heart sounds.  Pulmonary:     Effort: Pulmonary effort is normal. No respiratory distress.     Breath sounds: Normal breath sounds. No wheezing or rales.  Abdominal:     General: Abdomen is flat. Bowel sounds are normal. There is no distension.     Palpations: There is no mass.     Tenderness: There is no abdominal tenderness.  Musculoskeletal:     Right lower leg: No edema.     Left lower leg: No edema.  Lymphadenopathy:     Cervical: No cervical adenopathy.  Skin:    General: Skin is warm and dry.     Capillary Refill: Capillary refill takes less than 2 seconds.     Findings: No rash.     Comments: Peeling skin on feet and between toes from tinea pedis Also thickened toe nails   Neurological:     Mental Status: He is alert.     Cranial Nerves:  No cranial nerve deficit.     Sensory: Sensory deficit present.     Motor: No weakness, atrophy or abnormal muscle tone.     Coordination: Coordination normal.     Gait: Gait normal.     Deep Tendon Reflexes: Reflexes normal.     Comments: Oval area of L anterolateral upper thigh has reduced sensation to light touch and pin prick and temperature  No motor changes  Nl rom of leg Nl gait   Psychiatric:        Mood and Affect: Mood normal.           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[place])    Pt is currently waiting for a visit in the liver clinic No alcohol intake  Ascites is much improved and he feels better         Endocrine   Diabetes type 2, uncontrolled ([place])    Not controlled with metformin 500 mg bid  Will inc to 1000 mg bid as tolerated Disc continued better diet /weight loss and exercise as tolerated [place] was sent in - pt will fill if affordable Lab Results  Component Value Date   HGBA1C 8.3 (A) 09/01/2018   Enc smoking cessation  F/u planned for June       Relevant Medications   metFORMIN (GLUCOPHAGE) 1000 MG tablet   Other Relevant Orders   POCT glycosylated hemoglobin (Hb A1C) (Completed)     Nervous and Auditory   Lateral femoral cutaneous entrapment syndrome - Primary    L leg in diabetic who is overweight  Also some reported trauma to that area (bumped on a table)  Disc self limiting nature of condition Would not treat unless very much  worse/bothersome  inst to call if worse or change in symptoms         Other   Smoking    Disc in detail risks of smoking and possible outcomes including copd, vascular/ heart disease, cancer , respiratory and sinus infections  Pt voices understanding Pt is close to quitting

## 2018-09-01 NOTE — Assessment & Plan Note (Signed)
Disc in detail risks of smoking and possible outcomes including copd, vascular/ heart disease, cancer , respiratory and sinus infections  Pt voices understanding Pt is close to quitting

## 2018-09-01 NOTE — Assessment & Plan Note (Signed)
Pt is currently waiting for a visit in the liver clinic No alcohol intake  Ascites is much improved and he feels better

## 2018-09-01 NOTE — Patient Instructions (Addendum)
Let's monitor the leg  If you develop worse symptoms or pain or any weakness let us know   For diabetes increase metformin dose to 1000 mg twice daily  If you get GI upset/diarrhea please let us know   Add januvia if able to afford it  Try to check glucose twice daily (am and 2 hours after a meal) or any time you think blood sugar may be low  Stick with diabetic diet  You can start slowly with exercise   Drink lots of water   Get a yearly diabetic eye exam when you can   Pick a date and quit smoking also   See you in June

## 2018-09-01 NOTE — Assessment & Plan Note (Signed)
Not controlled with metformin 500 mg bid  Will inc to 1000 mg bid as tolerated Disc continued better diet /weight loss and exercise as tolerated [place] was sent in - pt will fill if affordable Lab Results  Component Value Date   HGBA1C 8.3 (A) 09/01/2018   Enc smoking cessation  F/u planned for June

## 2018-09-01 NOTE — Assessment & Plan Note (Signed)
L leg in diabetic who is overweight  Also some reported trauma to that area (bumped on a table)  Disc self limiting nature of condition Would not treat unless very much worse/bothersome  inst to call if worse or change in symptoms

## 2018-10-01 ENCOUNTER — Telehealth: Payer: Self-pay

## 2018-10-01 NOTE — Telephone Encounter (Signed)
Left message for patient to call back  

## 2018-10-01 NOTE — Telephone Encounter (Signed)
-----   Message from Gatha Mayer, MD sent at 10/01/2018  1:10 PM EDT ----- Regarding: EGD/Colon in [place] Please contact him and tell him I have reviewed the liver clinic notes  I am ready if he is to go ahead with his EGD to screen for varices and also his colonoscopy for surveillance of polyps  We should also get him started on HAV/HBV vaccination unless he is doing that elsewhere

## 2018-10-05 NOTE — Telephone Encounter (Signed)
I spoke with the patient.  He wants to call me back when he has "gotten up"

## 2018-10-07 ENCOUNTER — Encounter: Payer: Self-pay | Admitting: Family Medicine

## 2018-10-07 NOTE — Telephone Encounter (Signed)
Left message for patient to call back  

## 2018-10-13 NOTE — Telephone Encounter (Signed)
Left message for patient to call back  

## 2018-10-14 NOTE — Telephone Encounter (Signed)
No return calls from the patient.  I will send a letter.

## 2018-10-27 ENCOUNTER — Telehealth: Payer: Self-pay | Admitting: Family Medicine

## 2018-10-27 DIAGNOSIS — R7401 Elevation of levels of liver transaminase levels: Secondary | ICD-10-CM

## 2018-10-27 DIAGNOSIS — E1165 Type 2 diabetes mellitus with hyperglycemia: Secondary | ICD-10-CM

## 2018-10-27 DIAGNOSIS — E7800 Pure hypercholesterolemia, unspecified: Secondary | ICD-10-CM

## 2018-10-27 DIAGNOSIS — I10 Essential (primary) hypertension: Secondary | ICD-10-CM

## 2018-10-27 DIAGNOSIS — Z125 Encounter for screening for malignant neoplasm of prostate: Secondary | ICD-10-CM

## 2018-10-27 NOTE — Telephone Encounter (Signed)
-----   Message from Ellamae Sia sent at 10/21/2018  2:38 PM EDT ----- Regarding: Lab orders for Thursday,6.11.20 Patient is scheduled for CPX labs, please order future labs, Thanks , Karna Christmas

## 2018-10-29 ENCOUNTER — Other Ambulatory Visit (INDEPENDENT_AMBULATORY_CARE_PROVIDER_SITE_OTHER): Payer: Managed Care, Other (non HMO)

## 2018-10-29 ENCOUNTER — Other Ambulatory Visit: Payer: Self-pay

## 2018-10-29 DIAGNOSIS — E7800 Pure hypercholesterolemia, unspecified: Secondary | ICD-10-CM | POA: Diagnosis not present

## 2018-10-29 DIAGNOSIS — I10 Essential (primary) hypertension: Secondary | ICD-10-CM | POA: Diagnosis not present

## 2018-10-29 DIAGNOSIS — Z125 Encounter for screening for malignant neoplasm of prostate: Secondary | ICD-10-CM

## 2018-10-29 DIAGNOSIS — R740 Nonspecific elevation of levels of transaminase and lactic acid dehydrogenase [LDH]: Secondary | ICD-10-CM | POA: Diagnosis not present

## 2018-10-29 DIAGNOSIS — R7401 Elevation of levels of liver transaminase levels: Secondary | ICD-10-CM

## 2018-10-29 DIAGNOSIS — E1165 Type 2 diabetes mellitus with hyperglycemia: Secondary | ICD-10-CM

## 2018-10-29 LAB — BASIC METABOLIC PANEL
BUN: 21 mg/dL (ref 6–23)
CO2: 23 mEq/L (ref 19–32)
Calcium: 10.3 mg/dL (ref 8.4–10.5)
Chloride: 101 mEq/L (ref 96–112)
Creatinine, Ser: 0.76 mg/dL (ref 0.40–1.50)
GFR: 126.95 mL/min (ref >60.00)
Glucose, Bld: 158 mg/dL — ABNORMAL HIGH (ref 70–99)
Potassium: 4.8 mEq/L (ref 3.5–5.1)
Sodium: 137 mEq/L (ref 135–145)

## 2018-10-29 LAB — CBC WITH DIFFERENTIAL/PLATELET
Basophils Absolute: 0.1 10*3/uL (ref 0.0–0.1)
Basophils Relative: 1.5 % (ref 0.0–3.0)
Eosinophils Absolute: 0.8 10*3/uL — ABNORMAL HIGH (ref 0.0–0.7)
Eosinophils Relative: 12.5 % — ABNORMAL HIGH (ref 0.0–5.0)
HCT: 42.3 % (ref 39.0–52.0)
Hemoglobin: 14.3 g/dL (ref 13.0–17.0)
Lymphocytes Relative: 27.7 % (ref 12.0–46.0)
Lymphs Abs: 1.7 10*3/uL (ref 0.7–4.0)
MCHC: 33.9 g/dL (ref 30.0–36.0)
MCV: 100.5 fl — ABNORMAL HIGH (ref 78.0–100.0)
Monocytes Absolute: 0.6 10*3/uL (ref 0.1–1.0)
Monocytes Relative: 9.8 % (ref 3.0–12.0)
Neutro Abs: 3.1 10*3/uL (ref 1.4–7.7)
Neutrophils Relative %: 48.5 % (ref 43.0–77.0)
Platelets: 279 10*3/uL (ref 150.0–400.0)
RBC: 4.21 Mil/uL — ABNORMAL LOW (ref 4.22–5.81)
RDW: 12.8 % (ref 11.5–15.5)
WBC: 6.3 10*3/uL (ref 4.0–10.5)

## 2018-10-29 LAB — HEPATIC FUNCTION PANEL
ALT: 23 U/L (ref 0–53)
AST: 20 U/L (ref 0–37)
Albumin: 4.5 g/dL (ref 3.5–5.2)
Alkaline Phosphatase: 202 U/L — ABNORMAL HIGH (ref 39–117)
Bilirubin, Direct: 0.1 mg/dL (ref 0.0–0.3)
Total Bilirubin: 0.5 mg/dL (ref 0.2–1.2)
Total Protein: 7.8 g/dL (ref 6.0–8.3)

## 2018-10-29 LAB — LIPID PANEL
Cholesterol: 224 mg/dL — ABNORMAL HIGH (ref 0–200)
HDL: 50.2 mg/dL (ref >39.00)
LDL Cholesterol: 154 mg/dL — ABNORMAL HIGH (ref 0–99)
NonHDL: 174
Total CHOL/HDL Ratio: 4
Triglycerides: 102 mg/dL (ref 0.0–149.0)
VLDL: 20.4 mg/dL (ref 0.0–40.0)

## 2018-10-29 LAB — PSA: PSA: 0.2 ng/mL (ref 0.10–4.00)

## 2018-10-29 LAB — TSH: TSH: 2.99 u[IU]/mL (ref 0.35–4.50)

## 2018-10-29 LAB — HEMOGLOBIN A1C: Hgb A1c MFr Bld: 9 % — ABNORMAL HIGH (ref 4.6–6.5)

## 2018-11-02 ENCOUNTER — Other Ambulatory Visit: Payer: Self-pay | Admitting: Nurse Practitioner

## 2018-11-02 DIAGNOSIS — K7469 Other cirrhosis of liver: Secondary | ICD-10-CM

## 2018-11-05 ENCOUNTER — Encounter: Payer: Self-pay | Admitting: Family Medicine

## 2018-11-05 ENCOUNTER — Ambulatory Visit (INDEPENDENT_AMBULATORY_CARE_PROVIDER_SITE_OTHER): Payer: Managed Care, Other (non HMO) | Admitting: Family Medicine

## 2018-11-05 ENCOUNTER — Other Ambulatory Visit: Payer: Self-pay

## 2018-11-05 VITALS — BP 126/84 | HR 82 | Temp 97.0°F | Ht 65.5 in | Wt 171.5 lb

## 2018-11-05 DIAGNOSIS — K709 Alcoholic liver disease, unspecified: Secondary | ICD-10-CM

## 2018-11-05 DIAGNOSIS — Z125 Encounter for screening for malignant neoplasm of prostate: Secondary | ICD-10-CM

## 2018-11-05 DIAGNOSIS — Z86010 Personal history of colonic polyps: Secondary | ICD-10-CM

## 2018-11-05 DIAGNOSIS — B353 Tinea pedis: Secondary | ICD-10-CM

## 2018-11-05 DIAGNOSIS — E663 Overweight: Secondary | ICD-10-CM

## 2018-11-05 DIAGNOSIS — Z0000 Encounter for general adult medical examination without abnormal findings: Secondary | ICD-10-CM

## 2018-11-05 DIAGNOSIS — E7800 Pure hypercholesterolemia, unspecified: Secondary | ICD-10-CM

## 2018-11-05 DIAGNOSIS — Z23 Encounter for immunization: Secondary | ICD-10-CM

## 2018-11-05 DIAGNOSIS — F17200 Nicotine dependence, unspecified, uncomplicated: Secondary | ICD-10-CM

## 2018-11-05 DIAGNOSIS — I10 Essential (primary) hypertension: Secondary | ICD-10-CM | POA: Diagnosis not present

## 2018-11-05 DIAGNOSIS — E1165 Type 2 diabetes mellitus with hyperglycemia: Secondary | ICD-10-CM | POA: Diagnosis not present

## 2018-11-05 MED ORDER — SITAGLIPTIN PHOSPHATE 100 MG PO TABS: 100.0000 mg | ORAL_TABLET | Freq: Every day | ORAL | 1 refills | Status: DC

## 2018-11-05 NOTE — Progress Notes (Signed)
Subjective:    Patient ID: Zachary Lewis, male    DOB: 08-31-1960, 58 y.o.   MRN: [id number]  HPI Here for health maintenance exam and to review chronic medical problems    Weight  Wt Readings from Last 3 Encounters:  11/05/18 171 lb 8 oz (77.8 kg)  09/01/18 177 lb 4 oz (80.4 kg)  08/04/18 179 lb (81.2 kg)  feels good with much lighter  28.11 kg/m   All his swelling is gone   Never shut down his workplace  Wears a mask all the time   Smoking status - still smoking  Is harder to quit than he thought it would be  Not 100% ready to quit   Colonoscopy 10/14 with 3 y recall  He has it scheduled July 21st  Dr Shayne Alken exam 7/17 Is due -he goes to [facility] - make that appointment when you can   Tetanus shot 12/09  Flu shot 1/20  PNA 23 vaccine 6/13  Seeing a specialist for his liver  Still taking spironolactone  And potassium No etoh since January -he does not crave it at all   Prostate health Lab Results  Component Value Date   PSA 0.20 10/29/2018   PSA 0.40 12/08/2012   PSA 0.41 06/10/2008   no trouble urinating Nocturia is once (better)   Neg HIV screening and hep C screening this year   bp is stable today  No cp or palpitations or headaches or edema  No side effects to medicines  BP Readings from Last 3 Encounters:  11/05/18 126/84  09/01/18 132/86  08/04/18 114/82    Lab Results  Component Value Date   CREATININE 0.76 10/29/2018   BUN 21 10/29/2018   NA 137 10/29/2018   K 4.8 10/29/2018   CL 101 10/29/2018   CO2 23 10/29/2018     DM2 Lab Results  Component Value Date   HGBA1C 9.0 (H) 10/29/2018   This is up significantly from 8.3  On januvia 100 mg - he did not pick it up! Also full dose metformin 1000 mg bid  Cannot take sulfonurea due to liver dz  No fried foods No red meat  No sweets except occ low fat ice cream/sugar free  Bread -quite a bit (has changed to open face sandwhich Pasta -none No rice  Watches portions  No chips or snack foods   Eats a lot of veggies (squash and zuccini for his carbs)  Loves green veggies  Some fruit - cut back on oranges   Exercise 4-5 times per week  Walks the track/uses treadmill/bike at home - 45 minutes at a time   Alcoholic hepatitis/liver dz/cirrhosis  Sees Dr Carlean Purl  Lab Results  Component Value Date   ALT 23 10/29/2018   AST 20 10/29/2018   ALKPHOS 202 (H) 10/29/2018   BILITOT 0.5 10/29/2018   edema /ascites   Hyperlipidemia Lab Results  Component Value Date   CHOL 224 (H) 10/29/2018   CHOL 574 (H) 05/29/2018   CHOL 235 (H) 01/12/2018   Lab Results  Component Value Date   HDL 50.20 10/29/2018   HDL 14.20 (L) 05/29/2018   HDL 74.40 01/12/2018   Lab Results  Component Value Date   LDLCALC 154 (H) 10/29/2018   LDLCALC 522 (H) 05/29/2018   LDLCALC 140 (H) 01/12/2018   Lab Results  Component Value Date   TRIG 102.0 10/29/2018   TRIG 190.0 (H) 05/29/2018   TRIG 106.0 01/12/2018  Lab Results  Component Value Date   CHOLHDL 4 10/29/2018   CHOLHDL 40 05/29/2018   CHOLHDL 3 01/12/2018   Lab Results  Component Value Date   LDLDIRECT 176.9 12/08/2012   LDLDIRECT 195.5 02/17/2012   LDLDIRECT 178.4 10/30/2011  off statin due to liver disease  Improved - but will keep working on it   Lab Results  Component Value Date   WBC 6.3 10/29/2018   HGB 14.3 10/29/2018   HCT 42.3 10/29/2018   MCV 100.5 (H) 10/29/2018   PLT 279.0 10/29/2018    Patient Active Problem List   Diagnosis Date Noted  . 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 Cholestatic pruritus 08/04/2018  . Hypokalemia 06/24/2018  . Alcoholic hepatitis   . Alcoholic liver disease ([place])   . Jaundice   . Scleral icterus 05/29/2018  . Transaminitis 05/29/2018  . Smoking 01/04/2014  . Personal history of colonic adenomas 03/09/2013  . Prostate cancer screening 12/07/2012  . Routine  general medical examination at a health care facility 08/23/2010  . HYPERTENSION, BENIGN ESSENTIAL 01/05/2010  . BACK PAIN, LUMBAR, WITH RADICULOPATHY 09/13/2008  . Overweight (BMI 25.0-29.9) 09/08/2007  . TINEA PEDIS 07/16/2007  . Diabetes type 2, uncontrolled ([place]) 08/13/2006  . Hyperlipidemia 08/13/2006  . ALLERGY 08/13/2006  . Fatty liver 08/13/2006   Past Medical History:  Diagnosis Date  . 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 Diabetes mellitus    Type II  . Hyperlipidemia   . Hypertension   . Obesity   . Personal history of colonic adenomas 03/09/2013  . Tinea pedis    Chronic   Past Surgical History:  Procedure Laterality Date  . COLONOSCOPY W/ POLYPECTOMY  2014  . IR PARACENTESIS  07/02/2018  . IR TRANSCATHETER BX  07/17/2018  . IR VENOGRAM HEPATIC WO HEMODYNAMIC EVALUATION  07/17/2018  . myringectomy  06/2005  . UMBILICAL HERNIA REPAIR  01/1997  . VASECTOMY  07/2002   Social History   Tobacco Use  . Smoking status: Light Tobacco Smoker    Packs/day: 0.25  . Smokeless tobacco: Never Used  Substance Use Topics  . 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 Drug use: No   Family History  Problem Relation Age of Onset  . Hypertension Other   . Diabetes Neg Hx   . Heart disease Neg Hx   . Colon cancer Neg Hx   . Stomach cancer Neg Hx   . Pancreatic disease Neg Hx    No Known Allergies Current Outpatient Medications on File Prior to Visit  Medication Sig Dispense Refill  . Blood Glucose Calibration (ONETOUCH VERIO) SOLN Use to calibrate meter 1 each 3  . Blood Glucose Monitoring Suppl (BLOOD GLUCOSE METER) kit To check glucose once daily and as needed for diabetes 1 each 0  . Blood Glucose Monitoring Suppl (ONETOUCH VERIO) w/Device KIT Use to check blood sugar once daily and as needed for DM (dx. E11.65) 1 kit 0  . cholestyramine light (PREVALITE) 4 g  packet Take 1 packet (4 g total) by mouth 2 (two) times daily. 60 packet 3  . glucose blood (ONETOUCH VERIO) test strip Use to check blood sugar once daily and as needed for DM (dx. E11.65) 100 each 3  . metFORMIN (GLUCOPHAGE) 1000 MG  tablet Take 1 tablet (1,000 mg total) by mouth 2 (two) times daily with a meal. 180 tablet 1  . ONETOUCH DELICA LANCETS 87G MISC 1 each by Does not apply route daily as needed. Use to check blood sugar once daily and as needed for DM (dx. E11.65) 100 each 3  . potassium chloride (KLOR-CON 10) 10 MEQ tablet Take 1 tablet (10 mEq total) by mouth daily. 90 tablet 1  . spironolactone (ALDACTONE) 50 MG tablet TAKE 1 TABLET(50 MG) BY MOUTH DAILY 30 tablet 2  . thiamine 100 MG tablet Take 1 tablet (100 mg total) by mouth daily. 30 tablet 0  . ursodiol (ACTIGALL) 300 MG capsule Take 1 capsule (300 mg total) by mouth 2 (two) times daily. Do not take 2 hours before or 4 hours after cholestyramine 60 capsule 3   No current facility-administered medications on file prior to visit.     Review of Systems  Constitutional: Negative for activity change, appetite change, fatigue, fever and unexpected weight change.  HENT: Negative for congestion, rhinorrhea, sore throat and trouble swallowing.   Eyes: Negative for pain, redness, itching and visual disturbance.  Respiratory: Negative for cough, chest tightness, shortness of breath and wheezing.   Cardiovascular: Negative for chest pain and palpitations.  Gastrointestinal: Negative for abdominal pain, blood in stool, constipation, diarrhea and nausea.  Endocrine: Negative for cold intolerance, heat intolerance, polydipsia and polyuria.  Genitourinary: Negative for difficulty urinating, dysuria, frequency and urgency.  Musculoskeletal: Negative for arthralgias, joint swelling and myalgias.  Skin: Negative for pallor and rash.       athelete's foot Thickened toe nails  Neurological: Negative for dizziness, tremors, weakness, numbness  and headaches.  Hematological: Negative for adenopathy. Does not bruise/bleed easily.  Psychiatric/Behavioral: Negative for decreased concentration and dysphoric mood. The patient is not nervous/anxious.        Objective:   Physical Exam Constitutional:      General: He is not in acute distress.    Appearance: Normal appearance. He is well-developed. He is not ill-appearing or diaphoretic.     Comments: overwt  HENT:     Head: Normocephalic and atraumatic.     Right Ear: Tympanic membrane, ear canal and external ear normal.     Left Ear: Tympanic membrane, ear canal and external ear normal.     Nose: Nose normal.  Eyes:     General: No scleral icterus.       Right eye: No discharge.        Left eye: No discharge.     Conjunctiva/sclera: Conjunctivae normal.     Pupils: Pupils are equal, round, and reactive to light.  Neck:     Musculoskeletal: Normal range of motion and neck supple.     Thyroid: No thyromegaly.     Vascular: No carotid bruit or JVD.  Cardiovascular:     Rate and Rhythm: Normal rate and regular rhythm.     Heart sounds: Normal heart sounds. No gallop.   Pulmonary:     Effort: Pulmonary effort is normal. No respiratory distress.     Breath sounds: Normal breath sounds. No wheezing.  Chest:     Chest wall: No tenderness.  Abdominal:     General: Bowel sounds are normal. There is no distension or abdominal bruit.     Palpations: Abdomen is soft. There is no mass.     Tenderness: There is no abdominal tenderness.     Hernia: No hernia is present.  Musculoskeletal:  General: No tenderness.     Right lower leg: No edema.     Left lower leg: No edema.  Lymphadenopathy:     Cervical: No cervical adenopathy.  Skin:    General: Skin is warm and dry.     Capillary Refill: Capillary refill takes less than 2 seconds.     Coloration: Skin is not pale.     Findings: No erythema or rash.     Comments: Peeling skin on feet- improved but still present  Very  thickened toe nails -difficult to cut   Neurological:     Mental Status: He is alert.     Cranial Nerves: No cranial nerve deficit.     Motor: No abnormal muscle tone.     Coordination: Coordination normal.     Gait: Gait normal.     Deep Tendon Reflexes: Reflexes are normal and symmetric.  Psychiatric:        Mood and Affect: Mood normal.           Assessment & Plan:   Problem List Items Addressed This Visit      Cardiovascular and Mediastinum   HYPERTENSION, BENIGN ESSENTIAL    bp in fair control at this time  BP Readings from Last 1 Encounters:  11/05/18 126/84   No changes needed Most recent labs reviewed  Disc lifstyle change with low sodium diet and exercise          Digestive   Alcoholic liver disease ([place])    Continues to see liver specialist and abstain from etoh  Edema is controlled/ as is ascites Feeling good and doing much better overall        Endocrine   Diabetes type 2, uncontrolled ([place])    Lab Results  Component Value Date   HGBA1C 9.0 (H) 10/29/2018   Pt never did start [place] but is taking metformin more compliantly  Disc foot care and ref made to podiatry for tinea /nail care  Rev diet/exercise  Cannot take sulfonurea due to liver dz  Also cannot take statin due to liver dz F/u3 mo after taking januvia /alert if side eff Keep up the good work with healthy diet and exercise       Relevant Medications   sitaGLIPtin (JANUVIA) 100 MG tablet   Other Relevant Orders   Ambulatory referral to Podiatry     Musculoskeletal and Integument   TINEA PEDIS    Ref to podiatry for this/thickened nails/DM foot care  Some improvement with otc antifungal tx      Relevant Orders   Ambulatory referral to Podiatry     Other   Hyperlipidemia    Disc goals for lipids and reasons to control them Rev last labs with pt Rev low sat fat diet in detail  LDL down to 154 with better diet  Cannot take statin due to live disease       Overweight  (BMI 25.0-29.9)    Discussed how this problem influences overall health and the risks it imposes  Reviewed plan for weight loss with lower calorie diet (via better food choices and also portion control or program like weight watchers) and exercise building up to or more than 30 minutes 5 days per week including some aerobic activity   Commended wt loss so far      Routine general medical examination at a health care facility - Primary    Reviewed health habits including diet and exercise and skin cancer prevention Reviewed appropriate screening tests for  age  Also reviewed health mt list, fam hx and immunization status , as well as social and family history   See HPI Labs reviewed  Commended re: lifestyle change Tdap vaccine today  Pt plans on making his own eye exam appt       Relevant Orders   Tdap vaccine greater than or equal to 7yo IM (Completed)   Prostate cancer screening    Lab Results  Component Value Date   PSA 0.20 10/29/2018   PSA 0.40 12/08/2012   PSA 0.41 06/10/2008   No symptoms or family hx      Personal history of colonic adenomas   Smoking    Disc in detail risks of smoking and possible outcomes including copd, vascular/ heart disease, cancer , respiratory and sinus infections  Pt voices understanding        Other Visit Diagnoses    Need for Tdap vaccination       Relevant Orders   Tdap vaccine greater than or equal to 7yo IM (Completed)

## 2018-11-05 NOTE — Patient Instructions (Addendum)
Don't forget to get your eye exam  We need a copy from them to put in your chart Once yearly for diabetic eye exam is very important   You need to start Januvia !   Diabetes is not in control yet  Start checking glucose 2 hours after meals   Follow up in 3 months for diabetes   I will place a referral to a foot doctor and the office will call you to schedule that   Tetanus shot today

## 2018-11-08 NOTE — Assessment & Plan Note (Signed)
Discussed how this problem influences overall health and the risks it imposes  °Reviewed plan for weight loss with lower calorie diet (via better food choices and also portion control or program like weight watchers) and exercise building up to or more than 30 minutes 5 days per week including some aerobic activity  ° °Commended wt loss so far  °

## 2018-11-08 NOTE — Assessment & Plan Note (Signed)
Disc in detail risks of smoking and possible outcomes including copd, vascular/ heart disease, cancer , respiratory and sinus infections  Pt voices understanding

## 2018-11-08 NOTE — Assessment & Plan Note (Signed)
Ref to podiatry for this/thickened nails/DM foot care  Some improvement with otc antifungal tx

## 2018-11-08 NOTE — Assessment & Plan Note (Signed)
Disc goals for lipids and reasons to control them Rev last labs with pt Rev low sat fat diet in detail  LDL down to 154 with better diet  Cannot take statin due to live disease

## 2018-11-08 NOTE — Assessment & Plan Note (Signed)
Reviewed health habits including diet and exercise and skin cancer prevention Reviewed appropriate screening tests for age  Also reviewed health mt list, fam hx and immunization status , as well as social and family history   See HPI Labs reviewed  Commended re: lifestyle change Tdap vaccine today  Pt plans on making his own eye exam appt

## 2018-11-08 NOTE — Assessment & Plan Note (Signed)
Continues to see liver specialist and abstain from etoh  Edema is controlled/ as is ascites Feeling good and doing much better overall

## 2018-11-08 NOTE — Assessment & Plan Note (Signed)
Lab Results  Component Value Date   HGBA1C 9.0 (H) 10/29/2018   Pt never did start [place] but is taking metformin more compliantly  Disc foot care and ref made to podiatry for tinea /nail care  Rev diet/exercise  Cannot take sulfonurea due to liver dz  Also cannot take statin due to liver dz F/u3 mo after taking januvia /alert if side eff Keep up the good work with healthy diet and exercise

## 2018-11-08 NOTE — Assessment & Plan Note (Signed)
bp in fair control at this time  BP Readings from Last 1 Encounters:  11/05/18 126/84   No changes needed Most recent labs reviewed  Disc lifstyle change with low sodium diet and exercise

## 2018-11-08 NOTE — Assessment & Plan Note (Signed)
Lab Results  Component Value Date   PSA 0.20 10/29/2018   PSA 0.40 12/08/2012   PSA 0.41 06/10/2008   No symptoms or family hx

## 2018-11-13 ENCOUNTER — Telehealth: Payer: Self-pay | Admitting: Internal Medicine

## 2018-11-13 NOTE — Telephone Encounter (Signed)
Please advise Sir? Thank you. 

## 2018-11-13 NOTE — Telephone Encounter (Signed)
Patient not currently on an ED medicine. He wondered if you could prescribe one. I told him that would go thru his PCP or a urology Dr. I informed him that you do not recommend the OTC treatments.

## 2018-11-13 NOTE — Telephone Encounter (Signed)
I cannot answer this question as I do not have specifics.  If he has been prescribed an erectile dysfunction medication like Viagra he should consult with the prescriber though I think that would be ok  I do not support or recommend over the counter treatments for this

## 2018-11-25 ENCOUNTER — Ambulatory Visit (AMBULATORY_SURGERY_CENTER): Payer: Self-pay | Admitting: *Deleted

## 2018-11-25 ENCOUNTER — Other Ambulatory Visit: Payer: Self-pay

## 2018-11-25 VITALS — Ht 66.0 in | Wt 168.0 lb

## 2018-11-25 DIAGNOSIS — R188 Other ascites: Secondary | ICD-10-CM

## 2018-11-25 DIAGNOSIS — K7460 Unspecified cirrhosis of liver: Secondary | ICD-10-CM

## 2018-11-25 DIAGNOSIS — Z86010 Personal history of colonic polyps: Secondary | ICD-10-CM

## 2018-11-25 NOTE — Progress Notes (Signed)
No egg or soy allergy known to patient  No issues with past sedation with any surgeries  or procedures, no intubation problems  No diet pills per patient No home 02 use per patient  No blood thinners per patient  Pt denies issues with constipation  No A fib or A flutter     Pt verified name, DOB, address and insurance during PV today. Pt mailed instruction packet to included paper to complete and mail back to [facility] with addressed and stamped envelope,  copy of consent form to read and not return, and instructions.  PV completed over the phone. Pt encouraged to call with questions or issues

## 2018-12-01 ENCOUNTER — Ambulatory Visit (INDEPENDENT_AMBULATORY_CARE_PROVIDER_SITE_OTHER): Payer: Managed Care, Other (non HMO) | Admitting: Podiatry

## 2018-12-01 ENCOUNTER — Encounter: Payer: Self-pay | Admitting: Podiatry

## 2018-12-01 ENCOUNTER — Other Ambulatory Visit: Payer: Self-pay

## 2018-12-01 DIAGNOSIS — B351 Tinea unguium: Secondary | ICD-10-CM | POA: Insufficient documentation

## 2018-12-01 DIAGNOSIS — M79674 Pain in right toe(s): Secondary | ICD-10-CM | POA: Diagnosis not present

## 2018-12-01 DIAGNOSIS — M79675 Pain in left toe(s): Secondary | ICD-10-CM

## 2018-12-01 DIAGNOSIS — E1165 Type 2 diabetes mellitus with hyperglycemia: Secondary | ICD-10-CM | POA: Diagnosis not present

## 2018-12-01 DIAGNOSIS — Q828 Other specified congenital malformations of skin: Secondary | ICD-10-CM | POA: Insufficient documentation

## 2018-12-01 DIAGNOSIS — L84 Corns and callosities: Secondary | ICD-10-CM | POA: Diagnosis not present

## 2018-12-01 NOTE — Progress Notes (Signed)
This patient presents to the office with chief complaint of long thick nails,  callus  and diabetic feet.  This patient  says there  is   pain and discomfort in his due to nails and calluses. feet.    These nails and calluses  are painful walking and wearing shoes.  Patient has no history of infection or drainage from both feet.  Patient is unable to  self treat his own nails and calluses. . This patient presents  to the office today for treatment of the  long nails, treatment of calluses  and a foot evaluation due to history of  Diabetes. Patient says his HbA1C is elevated.  General Appearance  Alert, conversant and in no acute stress.  Vascular  Dorsalis pedis and posterior tibial  pulses are palpable  bilaterally.  Capillary return is within normal limits  bilaterally. Temperature is within normal limits  bilaterally.  Neurologic  Senn-Weinstein monofilament wire test within normal limits  bilaterally. Muscle power within normal limits bilaterally.  Nails Thick disfigured discolored nails with subungual debris  from hallux to fifth toes bilaterally. No evidence of bacterial infection or drainage bilaterally.  Orthopedic  No limitations of motion of motion feet .  No crepitus or effusions noted.  No bony pathology or digital deformities noted. Plantar flex fifth metatarsal heads  B/L  Skin  normotropic skin with  porokeratosis noted sub 4 left  and sub 1 right.  Preulcerous callus sub 5th met  B/l.Marland Kitchen  No signs of infections or ulcers noted.     Onychomycosis   Callus  B/L. Diabetes with no foot complications  IE  Debride nails x 10.  Debride callus  B/L.  A diabetic foot exam was performed and there is no evidence of any vascular or neurologic pathology.   RTC 3 months. Discussed possible surgery for pre-ulcerous callus  B/L.  Told him to discuss this with his  PCP.   Gardiner Barefoot DPM

## 2018-12-07 ENCOUNTER — Telehealth: Payer: Self-pay | Admitting: Internal Medicine

## 2018-12-07 NOTE — Telephone Encounter (Signed)

## 2018-12-08 ENCOUNTER — Ambulatory Visit (AMBULATORY_SURGERY_CENTER): Payer: Managed Care, Other (non HMO) | Admitting: Internal Medicine

## 2018-12-08 ENCOUNTER — Other Ambulatory Visit: Payer: Self-pay

## 2018-12-08 ENCOUNTER — Encounter: Payer: Self-pay | Admitting: Internal Medicine

## 2018-12-08 VITALS — BP 120/88 | HR 76 | Temp 98.4°F | Resp 14

## 2018-12-08 DIAGNOSIS — K3189 Other diseases of stomach and duodenum: Secondary | ICD-10-CM | POA: Diagnosis not present

## 2018-12-08 DIAGNOSIS — K253 Acute gastric ulcer without hemorrhage or perforation: Secondary | ICD-10-CM

## 2018-12-08 DIAGNOSIS — K7469 Other cirrhosis of liver: Secondary | ICD-10-CM

## 2018-12-08 DIAGNOSIS — K766 Portal hypertension: Secondary | ICD-10-CM | POA: Diagnosis not present

## 2018-12-08 DIAGNOSIS — D123 Benign neoplasm of transverse colon: Secondary | ICD-10-CM

## 2018-12-08 DIAGNOSIS — D124 Benign neoplasm of descending colon: Secondary | ICD-10-CM

## 2018-12-08 DIAGNOSIS — Z86010 Personal history of colonic polyps: Secondary | ICD-10-CM | POA: Diagnosis not present

## 2018-12-08 DIAGNOSIS — D120 Benign neoplasm of cecum: Secondary | ICD-10-CM

## 2018-12-08 DIAGNOSIS — K2970 Gastritis, unspecified, without bleeding: Secondary | ICD-10-CM

## 2018-12-08 DIAGNOSIS — K259 Gastric ulcer, unspecified as acute or chronic, without hemorrhage or perforation: Secondary | ICD-10-CM | POA: Diagnosis not present

## 2018-12-08 MED ORDER — SODIUM CHLORIDE 0.9 % IV SOLN: 500.0000 mL | Freq: Once | INTRAVENOUS | Status: DC

## 2018-12-08 MED ORDER — OMEPRAZOLE 40 MG PO CPDR: 40.0000 mg | DELAYED_RELEASE_CAPSULE | Freq: Every day | ORAL | 0 refills | Status: DC

## 2018-12-08 NOTE — Progress Notes (Signed)
Pt's states no medical or surgical changes since previsit or office visit. 

## 2018-12-08 NOTE — Progress Notes (Signed)
Called to room to assist during endoscopic procedure.  Patient ID and intended procedure confirmed with present staff. Received instructions for my participation in the procedure from the performing physician.  

## 2018-12-08 NOTE — Op Note (Signed)
[place] Patient Name: Zachary Lewis Procedure Date: 12/08/2018 1:35 PM MRN: [id number] Endoscopist: Gatha Mayer , MD Age: 58 Referring MD:  Date of Birth: [birth year] Gender: Male Account #: [id number] Procedure:                Upper GI endoscopy Indications:              Cirrhosis rule out esophageal varices Medicines:                Propofol per Anesthesia, Monitored Anesthesia Care Procedure:                Pre-Anesthesia Assessment:                           - Prior to the procedure, a History and Physical                            was performed, and patient medications and                            allergies were reviewed. The patient's tolerance of                            previous anesthesia was also reviewed. The risks                            and benefits of the procedure and the sedation                            options and risks were discussed with the patient.                            All questions were answered, and informed consent                            was obtained. Prior Anticoagulants: The patient has                            taken no previous anticoagulant or antiplatelet                            agents. ASA Grade Assessment: III - A patient with                            severe systemic disease. After reviewing the risks                            and benefits, the patient was deemed in                            satisfactory condition to undergo the procedure.                           After obtaining informed consent, the endoscope was  passed under direct vision. Throughout the                            procedure, the patient's blood pressure, pulse, and                            oxygen saturations were monitored continuously. The                            Model [id number]-[id number]) scope was introduced                            through the mouth, and advanced to the second part            of duodenum. The upper GI endoscopy was                            accomplished without difficulty. The patient                            tolerated the procedure well. Scope In: Scope Out: Findings:                 One non-bleeding cratered gastric ulcer with                            pigmented material was found in the prepyloric                            region of the stomach. The lesion was 5 mm in                            largest dimension. Biopsies were taken with a cold                            forceps for histology. Verification of patient                            identification for the specimen was done. Estimated                            blood loss was minimal.                           Diffuse severe inflammation characterized by                            adherent blood, erosions, erythema and friability                            was found in the gastric antrum and in the                            prepyloric region of the stomach. Biopsies were  taken with a cold forceps for histology.                            Verification of patient identification for the                            specimen was done. Estimated blood loss was minimal.                           ? of mild portal hypertensive gastropathy was found                            in the gastric fundus and in the gastric body.                           The exam was otherwise without abnormality.                           The cardia and gastric fundus were normal on                            retroflexion. Complications:            No immediate complications. Estimated Blood Loss:     Estimated blood loss was minimal. Impression:               - Non-bleeding gastric ulcer with pigmented                            material. Biopsied.                           - Gastritis. Biopsied.                           - Portal hypertensive gastropathy.                           -  The examination was otherwise normal. Recommendation:           - Patient has a contact number available for                            emergencies. The signs and symptoms of potential                            delayed complications were discussed with the                            patient. Return to normal activities tomorrow.                            Written discharge instructions were provided to the                            patient.                           -  Resume previous diet.                           - Continue present medications.                           - Await pathology results.                           - Use Prilosec (omeprazole) 40 mg PO daily.                           - Stop NSAIDS if taking                           Treat H pylori if + Gatha Mayer, MD 12/08/2018 2:18:40 PM This report has been signed electronically.

## 2018-12-08 NOTE — Progress Notes (Signed)
To PACU, VSS. Report to Rn.tb 

## 2018-12-08 NOTE — Patient Instructions (Addendum)
I found a stomach ulcer and stomach inflammation called gastritis. I took biopsies and will let you kbow.  I have prescribed omeprazole to take to treat this. Please pick that up and start soon.  If you are taking Goody's, aspirins, ibuprofen or Aleve - that may be causing this problem. You should stop those. It is ok to take acetaminophjen (Tylenol) up to 4 extra strength in a day - even with liver problems.  The colonoscopy showed 5 tiny polyps. No cancer.  I will let you know results and plans.  I appreciate the opportunity to care for you. Gatha Mayer, MD, [facility]  Handouts given for peptic(gastric) ulcers and gastritis.  YOU HAD AN ENDOSCOPIC PROCEDURE TODAY AT [facility]:   Refer to the procedure report that was given to you for any specific questions about what was found during the examination.  If the procedure report does not answer your questions, please call your gastroenterologist to clarify.  If you requested that your care partner not be given the details of your procedure findings, then the procedure report has been included in a sealed envelope for you to review at your convenience later.  YOU SHOULD EXPECT: Some feelings of bloating in the abdomen. Passage of more gas than usual.  Walking can help get rid of the air that was put into your GI tract during the procedure and reduce the bloating. If you had a lower endoscopy (such as a colonoscopy or flexible sigmoidoscopy) you may notice spotting of blood in your stool or on the toilet paper. If you underwent a bowel prep for your procedure, you may not have a normal bowel movement for a few days.  Please Note:  You might notice some irritation and congestion in your nose or some drainage.  This is from the oxygen used during your procedure.  There is no need for concern and it should clear up in a day or so.  SYMPTOMS TO REPORT IMMEDIATELY:   Following lower endoscopy (colonoscopy or flexible  sigmoidoscopy):  Excessive amounts of blood in the stool  Significant tenderness or worsening of abdominal pains  Swelling of the abdomen that is new, acute  Fever of 100F or higher   Following upper endoscopy (EGD)  Vomiting of blood or coffee ground material  New chest pain or pain under the shoulder blades  Painful or persistently difficult swallowing  New shortness of breath  Fever of 100F or higher  Black, tarry-looking stools  For urgent or emergent issues, a gastroenterologist can be reached at any hour by calling [phone].   DIET:  We do recommend a small meal at first, but then you may proceed to your regular diet.  Drink plenty of fluids but you should avoid alcoholic beverages for 24 hours.  ACTIVITY:  You should plan to take it easy for the rest of today and you should NOT DRIVE or use heavy machinery until tomorrow (because of the sedation medicines used during the test).    FOLLOW UP: Our staff will call the number listed on your records 48-72 hours following your procedure to check on you and address any questions or concerns that you may have regarding the information given to you following your procedure. If we do not reach you, we will leave a message.  We will attempt to reach you two times.  During this call, we will ask if you have developed any symptoms of COVID 19. If you develop any symptoms (ie:  fever, flu-like symptoms, shortness of breath, cough etc.) before then, please call [phone].  If you test positive for Covid 19 in the 2 weeks post procedure, please call and report this information to [place].    If any biopsies were taken you will be contacted by phone or by letter within the next 1-3 weeks.  Please call us at [phone] if you have not heard about the biopsies in 3 weeks.    SIGNATURES/CONFIDENTIALITY: You and/or your care partner have signed paperwork which will be entered into your electronic medical record.  These signatures attest to  the fact that that the information above on your After Visit Summary has been reviewed and is understood.  Full responsibility of the confidentiality of this discharge information lies with you and/or your care-partner.

## 2018-12-08 NOTE — Op Note (Signed)
[facility] Patient Name: Zachary Lewis Procedure Date: 12/08/2018 1:35 PM MRN: [id number] Endoscopist: Gatha Mayer , MD Age: 58 Referring MD:  Date of Birth: [birth year] Gender: Male Account #: [id number] Procedure:                Colonoscopy Indications:              Surveillance: Personal history of adenomatous                            polyps on last colonoscopy > 3 years ago Medicines:                Propofol per Anesthesia, Monitored Anesthesia Care Procedure:                Pre-Anesthesia Assessment:                           - Prior to the procedure, a History and Physical                            was performed, and patient medications and                            allergies were reviewed. The patient's tolerance of                            previous anesthesia was also reviewed. The risks                            and benefits of the procedure and the sedation                            options and risks were discussed with the patient.                            All questions were answered, and informed consent                            was obtained. Prior Anticoagulants: The patient has                            taken no previous anticoagulant or antiplatelet                            agents. ASA Grade Assessment: III - A patient with                            severe systemic disease. After reviewing the risks                            and benefits, the patient was deemed in                            satisfactory condition to undergo the procedure.  After obtaining informed consent, the colonoscope                            was passed under direct vision. Throughout the                            procedure, the patient's blood pressure, pulse, and                            oxygen saturations were monitored continuously. The                            Colonoscope was introduced through the anus and   advanced to the the cecum, identified by                            appendiceal orifice and ileocecal valve. The                            colonoscopy was performed without difficulty. The                            patient tolerated the procedure well. The quality                            of the bowel preparation was excellent. The                            ileocecal valve, appendiceal orifice, and rectum                            were photographed. The bowel preparation used was                            Miralax via split dose instruction. Scope In: 1:52:33 PM Scope Out: 2:06:28 PM Scope Withdrawal Time: 0 hours 12 minutes 15 seconds  Total Procedure Duration: 0 hours 13 minutes 55 seconds  Findings:                 The perianal and digital rectal examinations were                            normal. Pertinent negatives include normal prostate                            (size, shape, and consistency).                           Five sessile polyps were found in the descending                            colon, transverse colon and cecum. The polyps were                            diminutive in size. These polyps  were removed with                            a cold snare. Resection and retrieval were                            complete. Verification of patient identification                            for the specimen was done. Estimated blood loss was                            minimal.                           The exam was otherwise without abnormality on                            direct and retroflexion views. Complications:            No immediate complications. Estimated Blood Loss:     Estimated blood loss was minimal. Impression:               - Five diminutive polyps in the descending colon,                            in the transverse colon and in the cecum, removed                            with a cold snare. Resected and retrieved.                           - The  examination was otherwise normal on direct                            and retroflexion views.                           - Personal history of colonic polyps. Recommendation:           - Patient has a contact number available for                            emergencies. The signs and symptoms of potential                            delayed complications were discussed with the                            patient. Return to normal activities tomorrow.                            Written discharge instructions were provided to the                            patient.                           -  Resume previous diet.                           - Continue present medications.                           - Repeat colonoscopy is recommended for                            surveillance. The colonoscopy date will be                            determined after pathology results from today's                            exam become available for review. Gatha Mayer, MD 12/08/2018 2:21:23 PM This report has been signed electronically.

## 2018-12-10 ENCOUNTER — Telehealth: Payer: Self-pay

## 2018-12-10 NOTE — Telephone Encounter (Signed)
  Follow up Call-  Call back number 12/08/2018  Post procedure Call Back phone  # [phone]  Permission to leave phone message Yes  Some recent data might be hidden     Patient questions:  Do you have a fever, pain , or abdominal swelling? No. Pain Score  0 *  Have you tolerated food without any problems? Yes.    Have you been able to return to your normal activities? Yes.    Do you have any questions about your discharge instructions: Diet   No. Medications  No. Follow up visit  No.  Do you have questions or concerns about your Care? No.  Actions: * If pain score is 4 or above: No action needed, pain <4.  1. Have you developed a fever since your procedure? no  2.   Have you had an respiratory symptoms (SOB or cough) since your procedure? no  3.   Have you tested positive for COVID 19 since your procedure no  4.   Have you had any family members/close contacts diagnosed with the COVID 19 since your procedure?  no   If yes to any of these questions please route to Joylene John, RN and Alphonsa Gin, Therapist, sports.

## 2018-12-13 ENCOUNTER — Encounter: Payer: Self-pay | Admitting: Internal Medicine

## 2018-12-13 DIAGNOSIS — T39395A Adverse effect of other nonsteroidal anti-inflammatory drugs [NSAID], initial encounter: Secondary | ICD-10-CM | POA: Insufficient documentation

## 2018-12-13 DIAGNOSIS — K259 Gastric ulcer, unspecified as acute or chronic, without hemorrhage or perforation: Secondary | ICD-10-CM | POA: Insufficient documentation

## 2018-12-13 HISTORY — DX: Gastric ulcer, unspecified as acute or chronic, without hemorrhage or perforation: K25.9

## 2018-12-13 HISTORY — DX: Adverse effect of other nonsteroidal anti-inflammatory drugs (NSAID), initial encounter: T39.395A

## 2018-12-13 NOTE — Progress Notes (Signed)
Ulcers benign and no H pylori polyps x 5 adenomas  Colon recall 2023 EGD no recall  My chart letter

## 2018-12-28 ENCOUNTER — Other Ambulatory Visit: Payer: Self-pay | Admitting: Family Medicine

## 2018-12-29 ENCOUNTER — Ambulatory Visit (INDEPENDENT_AMBULATORY_CARE_PROVIDER_SITE_OTHER)
Admission: RE | Admit: 2018-12-29 | Discharge: 2018-12-29 | Disposition: A | Discharge status: Home / Self Care | Payer: Managed Care, Other (non HMO) | Source: Ambulatory Visit | Attending: Family Medicine | Admitting: Family Medicine

## 2018-12-29 ENCOUNTER — Other Ambulatory Visit: Payer: Self-pay

## 2018-12-29 ENCOUNTER — Encounter: Payer: Self-pay | Admitting: Family Medicine

## 2018-12-29 ENCOUNTER — Ambulatory Visit (INDEPENDENT_AMBULATORY_CARE_PROVIDER_SITE_OTHER): Payer: Managed Care, Other (non HMO) | Admitting: Family Medicine

## 2018-12-29 VITALS — BP 122/82 | HR 88 | Temp 97.5°F | Ht 66.0 in | Wt 174.4 lb

## 2018-12-29 DIAGNOSIS — M25552 Pain in left hip: Secondary | ICD-10-CM

## 2018-12-29 DIAGNOSIS — M5442 Lumbago with sciatica, left side: Secondary | ICD-10-CM

## 2018-12-29 DIAGNOSIS — M545 Low back pain, unspecified: Secondary | ICD-10-CM | POA: Insufficient documentation

## 2018-12-29 MED ORDER — MELOXICAM 15 MG PO TABS: 15.0000 mg | ORAL_TABLET | Freq: Every day | ORAL | 1 refills | Status: DC | PRN

## 2018-12-29 MED ORDER — METHOCARBAMOL 500 MG PO TABS: 500.0000 mg | ORAL_TABLET | Freq: Three times a day (TID) | ORAL | 1 refills | Status: DC | PRN

## 2018-12-29 NOTE — Patient Instructions (Signed)
Try some heat or ice or both on painful area  Walk/stretch if it helps   Try meloxicam for pain and inflammation  Try methocarbamol for muscle spasm - caution of sedation   Xray of hip and low spine today   Then plan to follow

## 2018-12-29 NOTE — Assessment & Plan Note (Signed)
With L buttock pain  In lateral hip but w/o trochanteric tenderness Only slight discomfort with int rotation Films today

## 2018-12-29 NOTE — Progress Notes (Signed)
Subjective:    Patient ID: Zachary Lewis, male    DOB: [birth year], 58 y.o.   MRN: [id number]  HPI Here for L hip pain   Wt Readings from Last 3 Encounters:  12/29/18 174 lb 6 oz (79.1 kg)  11/25/18 168 lb (76.2 kg)  11/05/18 171 lb 8 oz (77.8 kg)   28.14 kg/m   6 mo -started  Now past 4 days -worse No injury known  Exercise - unable to do for last 4 d   (walking and lifting weights)   Hurts in lateral hip- very deep  No pain in groin  Hurts to sit on toilet  Sitting with feet on the floor  Sometimes hurts to walk   A little in back  Yesterday it went down his leg A dull constant pain   Has not tried heat or ice   No pain to lie on his bed still    Yesterday-took tylenol  Patient Active Problem List   Diagnosis Date Noted  . Low back pain 12/29/2018  . Hip pain, left 12/29/2018  . NSAID-induced gastric ulcer 12/13/2018  . Pain due to onychomycosis of toenails of both feet 12/01/2018  . Pre-ulcerative calluses 12/01/2018  . Porokeratosis 12/01/2018  . 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 Hypokalemia 06/24/2018  . Alcoholic hepatitis   . Alcoholic liver disease ([place])   . Smoking 01/04/2014  . Hx of adenomatous colonic polyps 03/09/2013  . Prostate cancer screening 12/07/2012  . Routine general medical examination at a health care facility 08/23/2010  . HYPERTENSION, BENIGN ESSENTIAL 01/05/2010  . BACK PAIN, LUMBAR, WITH RADICULOPATHY 09/13/2008  . Overweight (BMI 25.0-29.9) 09/08/2007  . TINEA PEDIS 07/16/2007  . Diabetes type 2, uncontrolled ([place]) 08/13/2006  . Hyperlipidemia 08/13/2006  . ALLERGY 08/13/2006  . Fatty liver 08/13/2006   Past Medical History:  Diagnosis Date  . 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 Diabetes mellitus    Type II  . Hyperlipidemia   . Hypertension   . NSAID-induced gastric ulcer 12/13/2018   EGD 11/2018 H pylori neg Omeprazole started  . Obesity   . Personal history of colonic adenomas 03/09/2013  . Tinea pedis    Chronic   Past Surgical History:  Procedure Laterality Date  . COLONOSCOPY    . COLONOSCOPY W/ POLYPECTOMY  2014  . IR PARACENTESIS  07/02/2018  . IR TRANSCATHETER BX  07/17/2018  . IR VENOGRAM HEPATIC WO HEMODYNAMIC EVALUATION  07/17/2018  . myringectomy  06/2005  . POLYPECTOMY    . UMBILICAL HERNIA REPAIR  01/1997  . VASECTOMY  07/2002   Social History   Tobacco Use  . Smoking status: Light Tobacco Smoker    Packs/day: 0.25  . Smokeless tobacco: Never Used  Substance Use Topics  . 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 Drug use: No   Family History  Problem Relation Age of Onset  . Hypertension Other   . Diabetes Neg Hx   . Heart disease Neg Hx   . Colon cancer Neg Hx   . Stomach cancer Neg Hx   . Pancreatic disease Neg Hx   . Colon polyps Neg Hx   . Esophageal cancer Neg Hx   . Rectal cancer Neg Hx    No Known  Allergies Current Outpatient Medications on File Prior to Visit  Medication Sig Dispense Refill  . Blood Glucose Calibration (ONETOUCH VERIO) SOLN Use to calibrate meter 1 each 3  . Blood Glucose Monitoring Suppl (BLOOD GLUCOSE METER) kit To check glucose once daily and as needed for diabetes 1 each 0  . Blood Glucose Monitoring Suppl (ONETOUCH VERIO) w/Device KIT Use to check blood sugar once daily and as needed for DM (dx. E11.65) 1 kit 0  . glucose blood (ONETOUCH VERIO) test strip Use to check blood sugar once daily and as needed for DM (dx. E11.65) 100 each 3  . metFORMIN (GLUCOPHAGE) 1000 MG tablet Take 1 tablet (1,000 mg total) by mouth 2 (two) times daily with a meal. 180 tablet 1  . omeprazole (PRILOSEC) 40 MG capsule Take 1 capsule (40 mg total) by mouth daily before breakfast. 90 capsule 0  . ONETOUCH DELICA LANCETS 73X MISC 1 each by Does not apply  route daily as needed. Use to check blood sugar once daily and as needed for DM (dx. E11.65) 100 each 3  . potassium chloride (KLOR-CON 10) 10 MEQ tablet Take 1 tablet (10 mEq total) by mouth daily. 90 tablet 1  . sitaGLIPtin (JANUVIA) 100 MG tablet Take 1 tablet (100 mg total) by mouth daily. 90 tablet 1  . spironolactone (ALDACTONE) 50 MG tablet TAKE 1 TABLET(50 MG) BY MOUTH DAILY 30 tablet 5  . thiamine 100 MG tablet Take 1 tablet (100 mg total) by mouth daily. 30 tablet 0   No current facility-administered medications on file prior to visit.      Review of Systems  Constitutional: Negative for activity change, appetite change, fatigue, fever and unexpected weight change.  HENT: Negative for congestion, rhinorrhea, sore throat and trouble swallowing.   Eyes: Negative for pain, redness, itching and visual disturbance.  Respiratory: Negative for cough, chest tightness, shortness of breath and wheezing.   Cardiovascular: Negative for chest pain and palpitations.  Gastrointestinal: Negative for abdominal pain, blood in stool, constipation, diarrhea and nausea.  Endocrine: Negative for cold intolerance, heat intolerance, polydipsia and polyuria.  Genitourinary: Negative for difficulty urinating, dysuria, frequency and urgency.  Musculoskeletal: Positive for arthralgias and back pain. Negative for joint swelling and myalgias.       L back/buttock and hip pain   Skin: Negative for pallor and rash.  Neurological: Negative for dizziness, tremors, weakness, numbness and headaches.  Hematological: Negative for adenopathy. Does not bruise/bleed easily.  Psychiatric/Behavioral: Negative for decreased concentration and dysphoric mood. The patient is not nervous/anxious.        Objective:   Physical Exam Constitutional:      General: He is not in acute distress.    Appearance: Normal appearance. He is normal weight. He is not ill-appearing or diaphoretic.  HENT:     Head: Normocephalic and  atraumatic.     Mouth/Throat:     Mouth: Mucous membranes are moist.  Eyes:     General: No scleral icterus.    Extraocular Movements: Extraocular movements intact.     Conjunctiva/sclera: Conjunctivae normal.     Pupils: Pupils are equal, round, and reactive to light.  Neck:     Musculoskeletal: Normal range of motion and neck supple. No neck rigidity or muscular tenderness.     Vascular: No carotid bruit.  Cardiovascular:     Rate and Rhythm: Normal rate and regular rhythm.     Pulses: Normal pulses.     Heart sounds: Normal heart sounds.  Pulmonary:  Effort: Pulmonary effort is normal. No respiratory distress.     Breath sounds: Normal breath sounds. No wheezing or rales.  Abdominal:     General: Abdomen is flat. Bowel sounds are normal. There is no distension.     Palpations: Abdomen is soft.     Tenderness: There is no abdominal tenderness.  Musculoskeletal:        General: Tenderness present. No deformity.     Right lower leg: No edema.     Left lower leg: No edema.     Comments: Mild LS tenderness Also tender in L SI area  LS flex 30 deg/ext nl  Pain on L sided lateral bend  L hip-no trochanteric tenderness No hip crepitus Mild pain on full internal rotation /nl ext rotation Neg SLR Gait favors R leg    Lymphadenopathy:     Cervical: No cervical adenopathy.  Skin:    General: Skin is warm and dry.     Coloration: Skin is not pale.     Findings: No erythema or rash.  Neurological:     Mental Status: He is alert. Mental status is at baseline.     Sensory: No sensory deficit.     Motor: No weakness.     Coordination: Coordination normal.     Deep Tendon Reflexes: Reflexes normal.  Psychiatric:        Mood and Affect: Mood normal.           Assessment & Plan:   Problem List Items Addressed This Visit      Other   Low back pain - Primary    L buttock and lateral hip pain  Good rom hip=unlikely OA Disc poss of bursitis/tendonitis or referred pain  from back  No neuro symptoms  Reassuring exam  Recommend meloxicam daily / methocarbamol prn (caution of sedation) and use of heat xrays now  Plan to follow interp      Relevant Medications   methocarbamol (ROBAXIN) 500 MG tablet   meloxicam (MOBIC) 15 MG tablet   Other Relevant Orders   DG HIP UNILAT WITH PELVIS 2-3 VIEWS LEFT (Completed)   DG Lumbar Spine Complete (Completed)   Hip pain, left    With L buttock pain  In lateral hip but w/o trochanteric tenderness Only slight discomfort with int rotation Films today      Relevant Orders   DG HIP UNILAT WITH PELVIS 2-3 VIEWS LEFT (Completed)   DG Lumbar Spine Complete (Completed)

## 2018-12-29 NOTE — Assessment & Plan Note (Addendum)
L buttock and lateral hip pain  Good rom hip=unlikely OA Disc poss of bursitis/tendonitis or referred pain from back  No neuro symptoms  Reassuring exam  Recommend meloxicam daily / methocarbamol prn (caution of sedation) and use of heat xrays now  Plan to follow interp

## 2018-12-30 ENCOUNTER — Telehealth: Payer: Self-pay | Admitting: Family Medicine

## 2018-12-30 MED ORDER — TRAMADOL HCL 50 MG PO TABS: 50.0000 mg | ORAL_TABLET | Freq: Three times a day (TID) | ORAL | 0 refills | Status: AC | PRN

## 2018-12-30 NOTE — Telephone Encounter (Signed)
Pt notified of all of Dr. Marliss Coots comments and warning on med and verbalized understanding

## 2018-12-30 NOTE — Telephone Encounter (Signed)
Best number [phone]  Pt stated he saw dr tower 8/11 and wanted to know if she would increase his rx for

## 2018-12-30 NOTE — Telephone Encounter (Signed)
Pt said he couldn't come in tomorrow to see Dr. Lorelei Pont because of his work schedule so appt with Dr. Lorelei Pont scheduled for Monday 01/04/19, pt wanted to know since he is on the highest does of the meloxicam  what can Dr. Glori Bickers prescribe to help with the pain until his appt with Dr. Lorelei Pont on Monday

## 2018-12-30 NOTE — Telephone Encounter (Signed)
Increase his meloxicam this is not helping with his pain  [facility] [place]

## 2018-12-30 NOTE — Telephone Encounter (Signed)
I sent some tramadol It is for severe pain only and can cause sedation/addiction  Use it only if necessary and do not drive with it  Continue the meloxicam also   Since this is a controlled substance-we are limited in how much we can give as well

## 2019-01-04 ENCOUNTER — Encounter: Payer: Self-pay | Admitting: Family Medicine

## 2019-01-04 ENCOUNTER — Other Ambulatory Visit: Payer: Self-pay

## 2019-01-04 ENCOUNTER — Ambulatory Visit (INDEPENDENT_AMBULATORY_CARE_PROVIDER_SITE_OTHER): Payer: Managed Care, Other (non HMO) | Admitting: Family Medicine

## 2019-01-04 VITALS — BP 120/80 | HR 88 | Temp 98.0°F | Ht 66.0 in | Wt 179.5 lb

## 2019-01-04 DIAGNOSIS — M7062 Trochanteric bursitis, left hip: Secondary | ICD-10-CM

## 2019-01-04 DIAGNOSIS — M549 Dorsalgia, unspecified: Secondary | ICD-10-CM

## 2019-01-04 DIAGNOSIS — M67952 Unspecified disorder of synovium and tendon, left thigh: Secondary | ICD-10-CM | POA: Diagnosis not present

## 2019-01-04 MED ORDER — PREDNISONE 10 MG PO TABS: ORAL_TABLET | ORAL | 0 refills | Status: DC

## 2019-01-04 NOTE — Progress Notes (Signed)
[name] [name], MD Primary Care and [place] at [facility] [place], [address] Phone: [phone]  FAX: [phone]  Zachary Lewis - 58 y.o. male  MRN [id number]  Date of Birth: Jun 15, 1960  Visit Date: 01/04/2019  PCP: Abner Greenspan, MD  Referred by: Tower, Wynelle Fanny, MD  Chief Complaint  Patient presents with  . Leg Pain    Left x 1 month   Subjective:   Zachary Lewis is a 58 y.o. very pleasant male patient with Body mass index is 28.97 kg/m. who presents with the following:  Pleasant gentleman who presents with left-sided posterior buttocks pain, lateral pain and to a lesser extent posterior leg pain ongoing for about 1 month.  He is not had any known injury.  There is no known aggravating factors.  It is worsened here quite recently.  He does work at [employer] and does quite a bit of lifting for his job.  He is not having any numbness or tingling.  He is mostly having lateral hip pain as well as pain in the buttocks.  He has tried multiple over-the-counter remedies including NSAIDs, Tylenol as well as heat.  None of these have provided significant relief.  Mostly lateral hip and buttocks.  No aggravating gactors.  Off and on for a could of months. Much worse.  Heting pad?  Honey baked hams.   Lab Results  Component Value Date   HGBA1C 9.0 (H) 10/29/2018    Lower dose of pred.  Past Medical History, Surgical History, Social History, Family History, Problem List, Medications, and Allergies have been reviewed and updated if relevant.  Patient Active Problem List   Diagnosis Date Noted  . Low back pain 12/29/2018  . Hip pain, left 12/29/2018  . NSAID-induced gastric ulcer 12/13/2018  . Pain due to onychomycosis of toenails of both feet 12/01/2018  . Pre-ulcerative calluses 12/01/2018  . Porokeratosis 12/01/2018  . 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 Hypokalemia 06/24/2018  . Alcoholic hepatitis   . Alcoholic liver disease ([place])   . Smoking 01/04/2014  . Hx of adenomatous colonic polyps 03/09/2013  . Prostate cancer screening 12/07/2012  . Routine general medical examination at a health care facility 08/23/2010  . HYPERTENSION, BENIGN ESSENTIAL 01/05/2010  . BACK PAIN, LUMBAR, WITH RADICULOPATHY 09/13/2008  . Overweight (BMI 25.0-29.9) 09/08/2007  . TINEA PEDIS 07/16/2007  . Diabetes type 2, uncontrolled ([place]) 08/13/2006  . Hyperlipidemia 08/13/2006  . ALLERGY 08/13/2006  . Fatty liver 08/13/2006    Past Medical History:  Diagnosis Date  . 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[place]) 08/04/2018  . Diabetes mellitus    Type II  . Hyperlipidemia   . Hypertension   . NSAID-induced gastric ulcer 12/13/2018   EGD 11/2018 H pylori neg Omeprazole started  . Obesity   . Personal history of colonic adenomas 03/09/2013  . Tinea pedis    Chronic    Past Surgical History:  Procedure Laterality Date  . COLONOSCOPY    . COLONOSCOPY W/ POLYPECTOMY  2014  . IR PARACENTESIS  07/02/2018  . IR TRANSCATHETER BX  07/17/2018  . IR VENOGRAM HEPATIC WO HEMODYNAMIC EVALUATION  07/17/2018  . myringectomy  06/2005  . POLYPECTOMY    . UMBILICAL HERNIA REPAIR  01/1997  . VASECTOMY  07/2002  Social History   Socioeconomic History  . Marital status: Married    Spouse name: Not on file  . Number of children: Not on file  . Years of education: Not on file  . Highest education level: Not on file  Occupational History  . Occupation: [employer]    Employer: [employer]  Social Needs  . Financial resource strain: Not on file  . Food insecurity    Worry: Not on file    Inability: Not on file  . Transportation needs    Medical: Not on file    Non-medical: Not on file  Tobacco Use  . Smoking status: Light Tobacco Smoker    Packs/day: 0.25  . Smokeless  tobacco: Never Used  Substance and Sexual Activity  . 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 Drug use: No  . Sexual activity: Not on file  Lifestyle  . Physical activity    Days per week: Not on file    Minutes per session: Not on file  . Stress: Not on file  Relationships  . Social Herbalist on phone: Not on file    Gets together: Not on file    Attends religious service: Not on file    Active member of club or organization: Not on file    Attends meetings of clubs or organizations: Not on file    Relationship status: Not on file  . Intimate partner violence    Fear of current or ex partner: Not on file    Emotionally abused: Not on file    Physically abused: Not on file    Forced sexual activity: Not on file  Other Topics Concern  . Not on file  Social History Narrative   Married, Agricultural consultant   Smoker, heavy drinking of [place] alcohol in past   No drug use    Family History  Problem Relation Age of Onset  . Hypertension Other   . Diabetes Neg Hx   . Heart disease Neg Hx   . Colon cancer Neg Hx   . Stomach cancer Neg Hx   . Pancreatic disease Neg Hx   . Colon polyps Neg Hx   . Esophageal cancer Neg Hx   . Rectal cancer Neg Hx     No Known Allergies  Medication list reviewed and updated in full in [place].  GEN: no acute illness or fever CV: No chest pain or shortness of breath MSK: detailed above Neuro: neurological signs are described above ROS O/w per HPI  Objective:   BP 120/80   Pulse 88   Temp 98 F (36.7 C) (Temporal)   Ht 5' 6"  (1.676 m)   Wt 179 lb 8 oz (81.4 kg)   SpO2 99%   BMI 28.97 kg/m    GEN: Well-developed,well-nourished,in no acute distress; alert,appropriate and cooperative throughout examination HEENT: Normocephalic and atraumatic without obvious abnormalities. Ears, externally no deformities PULM:  Breathing comfortably in no respiratory distress EXT: No clubbing, cyanosis, or edema PSYCH: Normally interactive. Cooperative during the interview. Pleasant. Friendly and conversant. Not anxious or depressed appearing. Normal, full affect.  Range of motion at  the waist: Flexion, extension, lateral bending and rotation: Forward flexion is limited to approximately 45 degrees.  Extension is limited.  Lateral rotation and bending is grossly appropriate.    No echymosis or edema  Rises to examination table with mild difficulty Gait: minimally antalgic  Inspection/Deformity: N Paraspinus Tenderness: Relatively tender from L2-S1 bilaterally.  B Ankle Dorsiflexion (L5,4): 5/5 B Great Toe Dorsiflexion (L5,4): 5/5 Heel Walk (L5): WNL Toe Walk (S1): WNL Rise/Squat (L4): WNL, mild pain  SENSORY B Medial Foot (L4): WNL B Dorsum (L5): WNL B Lateral (S1): WNL Light Touch: WNL Pinprick: WNL  REFLEXES Knee (L4): 2+ Ankle (S1): 2+  B SLR, seated: neg B SLR, supine: neg B FABER: neg B Reverse FABER: neg B Log Roll: neg B Stork: NT B Sciatic Notch: NT   HIP EXAM: SIDE: L ROM: Abduction, Flexion, Internal and External range of motion: full Pain with terminal IROM and EROM: minimal GTB: ttp on l SLR: NEG Knees: No effusion FABER: NT REVERSE FABER: NT, neg Piriformis: NT at direct palpation Str: flexion: 4+/5 abduction: 4/5 adduction: 4+/5 Strength testing non-tender  On the left-sided pelvic rim posteriorly there is tenderness to palpation along the caudal pelvic rim as well as lateral.  Radiology: Dg Lumbar Spine Complete  Result Date: 12/29/2018 CLINICAL DATA:  Low back pain EXAM: LUMBAR SPINE - COMPLETE 4+ VIEW COMPARISON:  None. FINDINGS: Five lumbar type vertebral segments. Vertebral body heights and alignment are maintained. No fracture identified. Intervertebral disc spaces are relatively preserved. Minimal degenerative endplate changes most pronounced at L5-S1. Mild lower  lumbar facet arthrosis. IMPRESSION: Mild lower lumbar spondylosis. Electronically Signed   By: Davina Poke M.D.   On: 12/29/2018 13:29   Dg Hip Unilat With Pelvis 2-3 Views Left  Result Date: 12/29/2018 CLINICAL DATA:  Left hip pain EXAM: DG HIP (WITH OR WITHOUT PELVIS) 2-3V LEFT COMPARISON:  None. FINDINGS: No acute fracture or malalignment. Mild left hip joint space narrowing. No focal osseous lesion. IMPRESSION: Mild degenerative changes without acute osseous abnormality. Electronically Signed   By: Davina Poke M.D.   On: 12/29/2018 13:30    Assessment and Plan:     ICD-10-CM   1. Tendinopathy of left gluteus medius  M67.952   2. Trochanteric bursitis of left hip  M70.62   3. Acute back pain, unspecified back location, unspecified back pain laterality  M54.9    Multipart case, I think that a lot of this is initially from back pain and then some subsequent secondary development of gluteus medius and minimus tendinopathy where he is having the most pain now.  He also has some relatively mild trochanteric bursitis.  Given his diabetes I am going to place him on some low-dose prednisone with some rehab of the hip.  Typically these do well.  I appreciate the opportunity to evaluate this very friendly patient. If you have any question regarding his care or prognosis, do not hesitate to ask.   Patient Instructions  Hip Rehab:  Hip Flexion: Toe up to ceiling, laying on your back. Lift your whole leg, 3 sets. Work up to being able to do #30 with each set.  Hip elevations, Toe and leg turned out to side.  Lift whole leg, 3 sets. Work up to being able to do #30 with each set.  Hip Abductions: Lying on side, straight out to side. 3 sets, work up to being able to do #30 with each set.  At the beginning you may only be able to do a lot less, try to do #10.     Follow-up: Return in about 6 weeks (around 02/15/2019).  Meds ordered this encounter  Medications  . predniSONE (DELTASONE)  10 MG tablet  Sig: 2 tabs po for 5 days, then 1 tab po for 5 days    Dispense:  15 tablet    Refill:  0   No orders of the defined types were placed in this encounter.   Signed,  Maud Deed. Donisha Hoch, MD   Outpatient Encounter Medications as of 01/04/2019  Medication Sig  . Blood Glucose Calibration (ONETOUCH VERIO) SOLN Use to calibrate meter  . Blood Glucose Monitoring Suppl (BLOOD GLUCOSE METER) kit To check glucose once daily and as needed for diabetes  . Blood Glucose Monitoring Suppl (ONETOUCH VERIO) w/Device KIT Use to check blood sugar once daily and as needed for DM (dx. E11.65)  . glucose blood (ONETOUCH VERIO) test strip Use to check blood sugar once daily and as needed for DM (dx. E11.65)  . meloxicam (MOBIC) 15 MG tablet Take 1 tablet (15 mg total) by mouth daily as needed for pain. With a meal  . metFORMIN (GLUCOPHAGE) 1000 MG tablet Take 1 tablet (1,000 mg total) by mouth 2 (two) times daily with a meal.  . methocarbamol (ROBAXIN) 500 MG tablet Take 1 tablet (500 mg total) by mouth every 8 (eight) hours as needed for muscle spasms (pain). Caution of sedation  . omeprazole (PRILOSEC) 40 MG capsule Take 1 capsule (40 mg total) by mouth daily before breakfast.  . ONETOUCH DELICA LANCETS 65Y MISC 1 each by Does not apply route daily as needed. Use to check blood sugar once daily and as needed for DM (dx. E11.65)  . potassium chloride (KLOR-CON 10) 10 MEQ tablet Take 1 tablet (10 mEq total) by mouth daily.  . sitaGLIPtin (JANUVIA) 100 MG tablet Take 1 tablet (100 mg total) by mouth daily.  Mar[place] Kitchen spironolactone (ALDACTONE) 50 MG tablet TAKE 1 TABLET(50 MG) BY MOUTH DAILY  . thiamine 100 MG tablet Take 1 tablet (100 mg total) by mouth daily.  . [EXPIRED] traMADol (ULTRAM) 50 MG tablet Take 1 tablet (50 mg total) by mouth 3 (three) times daily as needed for up to 5 days for severe pain. Caution of sedation and also habit  . predniSONE (DELTASONE) 10 MG tablet 2 tabs po for 5 days, then  1 tab po for 5 days   No facility-administered encounter medications on file as of 01/04/2019.

## 2019-01-04 NOTE — Patient Instructions (Signed)
Hip Rehab:  Hip Flexion: Toe up to ceiling, laying on your back. Lift your whole leg, 3 sets. Work up to being able to do #30 with each set.  Hip elevations, Toe and leg turned out to side.  Lift whole leg, 3 sets. Work up to being able to do #30 with each set.  Hip Abductions: Lying on side, straight out to side. 3 sets, work up to being able to do #30 with each set.  At the beginning you may only be able to do a lot less, try to do #10.

## 2019-02-02 ENCOUNTER — Ambulatory Visit: Payer: Managed Care, Other (non HMO) | Admitting: Family Medicine

## 2019-02-12 ENCOUNTER — Telehealth: Payer: Self-pay | Admitting: Family Medicine

## 2019-02-12 NOTE — Telephone Encounter (Signed)
Pt has appointment 9/28 @ 8 with dr tower and 8:40 with dr copland.  Pt cannot have 2 appointment with 2 different providers on same day.    One of the appointments needs to be r/s

## 2019-02-15 ENCOUNTER — Ambulatory Visit (INDEPENDENT_AMBULATORY_CARE_PROVIDER_SITE_OTHER): Payer: Managed Care, Other (non HMO) | Admitting: Family Medicine

## 2019-02-15 ENCOUNTER — Encounter: Payer: Self-pay | Admitting: Family Medicine

## 2019-02-15 ENCOUNTER — Other Ambulatory Visit: Payer: Self-pay

## 2019-02-15 ENCOUNTER — Ambulatory Visit: Payer: Managed Care, Other (non HMO) | Admitting: Family Medicine

## 2019-02-15 VITALS — BP 134/88 | HR 84 | Temp 97.5°F | Ht 66.0 in | Wt 185.4 lb

## 2019-02-15 DIAGNOSIS — I10 Essential (primary) hypertension: Secondary | ICD-10-CM | POA: Diagnosis not present

## 2019-02-15 DIAGNOSIS — Z23 Encounter for immunization: Secondary | ICD-10-CM

## 2019-02-15 DIAGNOSIS — E1165 Type 2 diabetes mellitus with hyperglycemia: Secondary | ICD-10-CM | POA: Diagnosis not present

## 2019-02-15 DIAGNOSIS — F17200 Nicotine dependence, unspecified, uncomplicated: Secondary | ICD-10-CM

## 2019-02-15 DIAGNOSIS — E7800 Pure hypercholesterolemia, unspecified: Secondary | ICD-10-CM

## 2019-02-15 DIAGNOSIS — K709 Alcoholic liver disease, unspecified: Secondary | ICD-10-CM

## 2019-02-15 DIAGNOSIS — L84 Corns and callosities: Secondary | ICD-10-CM

## 2019-02-15 LAB — POCT GLYCOSYLATED HEMOGLOBIN (HGB A1C): Hemoglobin A1C: 8.2 % — AB (ref 4.0–5.6)

## 2019-02-15 LAB — MICROALBUMIN / CREATININE URINE RATIO
Creatinine,U: 82.6 mg/dL
Microalb Creat Ratio: 4.2 mg/g (ref 0.0–30.0)
Microalb, Ur: 3.5 mg/dL — ABNORMAL HIGH (ref 0.0–1.9)

## 2019-02-15 NOTE — Assessment & Plan Note (Signed)
Disc in detail risks of smoking and possible outcomes including copd, vascular/ heart disease, cancer , respiratory and sinus infections  Pt voices understanding Pt plans to try nicotine patches otc  Has not set quit date

## 2019-02-15 NOTE — Assessment & Plan Note (Signed)
Per pt stable Followed by GI No etoh at all  No ascites Continues aldactone

## 2019-02-15 NOTE — Assessment & Plan Note (Addendum)
Gradually improving Lab Results  Component Value Date   HGBA1C 8.2 (A) 02/15/2019   Added [place] last time, doing well with this  Can do better with diet- will rev his ed materials and cut carbs and portions Enc to continue exercise  Flu shot given  opth exam ref done for DM eye exam  He will f/u with podiatry for foot calluses in oct  Cannot take statin due to liver dz  Considering ace microalbumin urine ordered today  Wt loss enc F/u 3 mo  May need to add medication at that time  May consider saxenda (also for wt loss) or basal insulin

## 2019-02-15 NOTE — Assessment & Plan Note (Signed)
bp in fair control at this time  BP Readings from Last 1 Encounters:  02/15/19 134/88   No changes needed Most recent labs reviewed  Disc lifstyle change with low sodium diet and exercise  Disc poss adding ace for DM renal protection in the future - is already on aldactone

## 2019-02-15 NOTE — Assessment & Plan Note (Signed)
Cannot take statin due to liver dz Disc goals for lipids and reasons to control them Rev last labs with pt Rev low sat fat diet in detail

## 2019-02-15 NOTE — Patient Instructions (Addendum)
Please set a quit date for smoking  Get nicotine patches over the counter for this   Our office will call you about the eye doctor referral   Follow up with foot doctor as planned to work on calluses  Keep working on diet and exercising  When ever possible - get starches (carbs) from fruits and vegetables (not white potato)  Also continue to stay away from sweets   Weight loss will help   Follow up in 3 months with labs prior   Goal is glucose in 120s in am and 140s two hours after a meal  See how close you can get   We may need to add more medicine next time   Leave a urine sample on the way out for kidney protein test

## 2019-02-15 NOTE — Progress Notes (Signed)
Subjective:    Patient ID: Zachary Lewis, male    DOB: Aug 02, 1960, 58 y.o.   MRN: [id number]  HPI Here for 3 mo f/u of chronic health problems   Wt Readings from Last 3 Encounters:  02/15/19 185 lb 6 oz (84.1 kg)  01/04/19 179 lb 8 oz (81.4 kg)  12/29/18 174 lb 6 oz (79.1 kg)  gained 5-6 lb  Has been lifting weights -? Muscle  Has not noted change in clothes  Still eating much better than he was and walking 3-4 times per week at least 45 minutes each time  29.92 kg/m   bp is stable today  No cp or palpitations or headaches or edema  No side effects to medicines  BP Readings from Last 3 Encounters:  02/15/19 134/88  01/04/19 120/80  12/29/18 122/82    Taking aldactone and K     DM2 Lab Results  Component Value Date   HGBA1C 9.0 (H) 10/29/2018  last visit added januvia to his metformin  His blood sugars have been pretty good overall   (stopped checking 2 weeks ago)  160-170 first thing in am and then higher later  Better than it was   It did go up with prednisone   Lab Results  Component Value Date   HGBA1C 8.2 (A) 02/15/2019   Today is improved some He can cut back on biscuits and bread  Has f/u with foot doctor   Cannot take sulfonurea or statin due to liver disease Eye exam - he has not scheduled yet - he goes to my eye doctor  Urine microalb    He was px prednisone for 10 days (low dose) from Dr Lorelei Pont in august for tendinopathy ofL gluteus and troch bursitis of hip  That did help - has not had pain lately    Hyperlipidemia Lab Results  Component Value Date   CHOL 224 (H) 10/29/2018   HDL 50.20 10/29/2018   LDLCALC 154 (H) 10/29/2018   LDLDIRECT 176.9 12/08/2012   TRIG 102.0 10/29/2018   CHOLHDL 4 10/29/2018   Unable to take statin due to liver dz Still not drinking at all   Smoking status  Smokes 1/4 ppd  Feeling ready to quit   H/o etoh liver dz  Lab Results  Component Value Date   ALT 23 10/29/2018   AST 20 10/29/2018   ALKPHOS  202 (H) 10/29/2018   BILITOT 0.5 10/29/2018    Has f/u with Dr Lorelei Pont next week F/u with Dr Carlean Purl -unsure now   Patient Active Problem List   Diagnosis Date Noted  . Low back pain 12/29/2018  . NSAID-induced gastric ulcer 12/13/2018  . Pain due to onychomycosis of toenails of both feet 12/01/2018  . Pre-ulcerative calluses 12/01/2018  . Porokeratosis 12/01/2018  . 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 Hypokalemia 06/24/2018  . Alcoholic hepatitis   . Alcoholic liver disease ([place])   . Smoking 01/04/2014  . Hx of adenomatous colonic polyps 03/09/2013  . Prostate cancer screening 12/07/2012  . Routine general medical examination at a health care facility 08/23/2010  . HYPERTENSION, BENIGN ESSENTIAL 01/05/2010  . BACK PAIN, LUMBAR, WITH RADICULOPATHY 09/13/2008  . Overweight (BMI 25.0-29.9) 09/08/2007  . TINEA PEDIS 07/16/2007  . Diabetes type 2, uncontrolled ([place]) 08/13/2006  . Hyperlipidemia 08/13/2006  . ALLERGY 08/13/2006  . Fatty liver 08/13/2006   Past Medical History:  Diagnosis Date  .  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 Diabetes mellitus    Type II  . Hyperlipidemia   . Hypertension   . NSAID-induced gastric ulcer 12/13/2018   EGD 11/2018 H pylori neg Omeprazole started  . Obesity   . Personal history of colonic adenomas 03/09/2013  . Tinea pedis    Chronic   Past Surgical History:  Procedure Laterality Date  . COLONOSCOPY    . COLONOSCOPY W/ POLYPECTOMY  2014  . IR PARACENTESIS  07/02/2018  . IR TRANSCATHETER BX  07/17/2018  . IR VENOGRAM HEPATIC WO HEMODYNAMIC EVALUATION  07/17/2018  . myringectomy  06/2005  . POLYPECTOMY    . UMBILICAL HERNIA REPAIR  01/1997  . VASECTOMY  07/2002   Social History   Tobacco Use  . Smoking status: Light Tobacco Smoker    Packs/day: 0.25  . Smokeless tobacco: Never Used  Substance Use Topics   . 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 Drug use: No   Family History  Problem Relation Age of Onset  . Hypertension Other   . Diabetes Neg Hx   . Heart disease Neg Hx   . Colon cancer Neg Hx   . Stomach cancer Neg Hx   . Pancreatic disease Neg Hx   . Colon polyps Neg Hx   . Esophageal cancer Neg Hx   . Rectal cancer Neg Hx    No Known Allergies Current Outpatient Medications on File Prior to Visit  Medication Sig Dispense Refill  . Blood Glucose Calibration (ONETOUCH VERIO) SOLN Use to calibrate meter 1 each 3  . Blood Glucose Monitoring Suppl (BLOOD GLUCOSE METER) kit To check glucose once daily and as needed for diabetes 1 each 0  . Blood Glucose Monitoring Suppl (ONETOUCH VERIO) w/Device KIT Use to check blood sugar once daily and as needed for DM (dx. E11.65) 1 kit 0  . glucose blood (ONETOUCH VERIO) test strip Use to check blood sugar once daily and as needed for DM (dx. E11.65) 100 each 3  . meloxicam (MOBIC) 15 MG tablet Take 1 tablet (15 mg total) by mouth daily as needed for pain. With a meal 30 tablet 1  . metFORMIN (GLUCOPHAGE) 1000 MG tablet Take 1 tablet (1,000 mg total) by mouth 2 (two) times daily with a meal. 180 tablet 1  . methocarbamol (ROBAXIN) 500 MG tablet Take 1 tablet (500 mg total) by mouth every 8 (eight) hours as needed for muscle spasms (pain). Caution of sedation 30 tablet 1  . omeprazole (PRILOSEC) 40 MG capsule Take 1 capsule (40 mg total) by mouth daily before breakfast. 90 capsule 0  . ONETOUCH DELICA LANCETS 27O MISC 1 each by Does not apply route daily as needed. Use to check blood sugar once daily and as needed for DM (dx. E11.65) 100 each 3  . potassium chloride (KLOR-CON 10) 10 MEQ tablet Take 1 tablet (10 mEq total) by mouth daily. 90 tablet 1  . predniSONE (DELTASONE) 10 MG tablet 2 tabs po for 5 days, then 1 tab po for 5 days 15 tablet 0  . sitaGLIPtin (JANUVIA)  100 MG tablet Take 1 tablet (100 mg total) by mouth daily. 90 tablet 1  . spironolactone (ALDACTONE) 50 MG tablet TAKE 1 TABLET(50 MG) BY MOUTH DAILY 30 tablet 5  . thiamine 100 MG tablet Take 1 tablet (100 mg total) by mouth  daily. 30 tablet 0   No current facility-administered medications on file prior to visit.     Review of Systems  Constitutional: Negative for activity change, appetite change, fatigue, fever and unexpected weight change.  HENT: Negative for congestion, rhinorrhea, sore throat and trouble swallowing.   Eyes: Negative for pain, redness, itching and visual disturbance.  Respiratory: Negative for cough, chest tightness, shortness of breath and wheezing.   Cardiovascular: Negative for chest pain and palpitations.  Gastrointestinal: Negative for abdominal pain, blood in stool, constipation, diarrhea and nausea.  Endocrine: Negative for cold intolerance, heat intolerance, polydipsia and polyuria.  Genitourinary: Negative for difficulty urinating, dysuria, frequency and urgency.  Musculoskeletal: Negative for arthralgias, joint swelling and myalgias.  Skin: Negative for pallor and rash.  Neurological: Negative for dizziness, tremors, weakness, numbness and headaches.  Hematological: Negative for adenopathy. Does not bruise/bleed easily.  Psychiatric/Behavioral: Negative for decreased concentration and dysphoric mood. The patient is not nervous/anxious.        Objective:   Physical Exam Constitutional:      General: He is not in acute distress.    Appearance: Normal appearance. He is well-developed. He is obese. He is not ill-appearing.  HENT:     Head: Normocephalic and atraumatic.     Mouth/Throat:     Mouth: Mucous membranes are moist.     Pharynx: Oropharynx is clear.  Eyes:     Conjunctiva/sclera: Conjunctivae normal.     Pupils: Pupils are equal, round, and reactive to light.  Neck:     Musculoskeletal: Normal range of motion and neck supple. No muscular  tenderness.     Thyroid: No thyromegaly.     Vascular: No carotid bruit or JVD.  Cardiovascular:     Rate and Rhythm: Normal rate and regular rhythm.     Heart sounds: Normal heart sounds. No gallop.   Pulmonary:     Effort: Pulmonary effort is normal. No respiratory distress.     Breath sounds: Normal breath sounds. No wheezing or rales.     Comments: Good air exch today Abdominal:     General: Bowel sounds are normal. There is no distension or abdominal bruit.     Palpations: Abdomen is soft. There is no mass.     Tenderness: There is no abdominal tenderness.     Hernia: No hernia is present.     Comments: No ascites  Musculoskeletal:     Right lower leg: No edema.     Left lower leg: No edema.  Lymphadenopathy:     Cervical: No cervical adenopathy.  Skin:    General: Skin is warm and dry.     Findings: No rash.  Neurological:     Mental Status: He is alert.     Coordination: Coordination normal.     Gait: Gait normal.     Deep Tendon Reflexes: Reflexes are normal and symmetric. Reflexes normal.  Psychiatric:        Mood and Affect: Mood normal.           Assessment & Plan:   Problem List Items Addressed This Visit      Cardiovascular and Mediastinum   HYPERTENSION, BENIGN ESSENTIAL    bp in fair control at this time  BP Readings from Last 1 Encounters:  02/15/19 134/88   No changes needed Most recent labs reviewed  Disc lifstyle change with low sodium diet and exercise  Disc poss adding ace for DM renal protection in the future - is already on aldactone  Digestive   Alcoholic liver disease ([place])    Per pt stable Followed by GI No etoh at all  No ascites Continues aldactone        Endocrine   Diabetes type 2, uncontrolled ([place]) - Primary    Gradually improving Lab Results  Component Value Date   HGBA1C 8.2 (A) 02/15/2019   Added [place] last time, doing well with this  Can do better with diet- will rev his ed materials and cut carbs and  portions Enc to continue exercise  Flu shot given  opth exam ref done for DM eye exam  He will f/u with podiatry for foot calluses in oct  Cannot take statin due to liver dz  Considering ace microalbumin urine ordered today  Wt loss enc F/u 3 mo  May need to add medication at that time  May consider saxenda (also for wt loss) or basal insulin      Relevant Orders   POCT glycosylated hemoglobin (Hb A1C) (Completed)   Ambulatory referral to Ophthalmology   Microalbumin / creatinine urine ratio     Musculoskeletal and Integument   Pre-ulcerative calluses    Both feet Has podiatry appt soon        Other   Hyperlipidemia    Cannot take statin due to liver dz Disc goals for lipids and reasons to control them Rev last labs with pt Rev low sat fat diet in detail       Smoking    Disc in detail risks of smoking and possible outcomes including copd, vascular/ heart disease, cancer , respiratory and sinus infections  Pt voices understanding Pt plans to try nicotine patches otc  Has not set quit date        Other Visit Diagnoses    Need for influenza vaccination       Relevant Orders   Flu Vaccine QUAD 6+ mos PF IM (Fluarix Quad PF) (Completed)

## 2019-02-15 NOTE — Assessment & Plan Note (Signed)
Both feet Has podiatry appt soon

## 2019-02-21 NOTE — Progress Notes (Signed)
Zachary Lewis T. Susan Bleich, MD Primary [facility] and [place] at [facility] [place], [address] Phone: [phone]  FAX: [phone]  Zachary Lewis - 58 y.o. male  MRN [id number]  Date of Birth: [birth year]  Visit Date: 02/22/2019  PCP: Abner Greenspan, MD  Referred by: Tower, Wynelle Fanny, MD  Chief Complaint  Patient presents with  . Follow-up    Left Leg   Subjective:   Zachary Lewis is a 58 y.o. very pleasant male patient with Body mass index is 30.14 kg/m. who presents with the following:  Multipart musculoskeletal issues.  I initially the patient did have some back pain, and subsequently he developed some secondary tendinopathy of the gluteus medius on the left.  He also has some trochanteric bursitis on the left which was mildly present on the last exam.  I did give him some low-dose prednisone and hip rehabilitation and follow-up is today to check on see how these things are doing.  75% better compared to before and on the outside.  No back pain.  Str better.  Generally he is doing quite a bit better compared to the last time I saw him.  At this point he is developed some pain globally in the left shoulder.  Left shoulder and hurts laterally.   Lab Results  Component Value Date   HGBA1C 8.2 (A) 02/15/2019    L frozen shoulder, he does have a T-shirt distribution of pain and limitation and loss of motion in all directions.  He does not have a history of trauma recently.  No significant history of any prior shoulder injuries including dislocation, fracture or surgeries.  01/04/2019 Last OV with Owens Loffler, MD  Pleasant gentleman who presents with left-sided posterior buttocks pain, lateral pain and to a lesser extent posterior leg pain ongoing for about 1 month.  He is not had any known injury.  There is no known aggravating factors.  It is worsened here quite recently.  He does work at [employer] and does quite  a bit of lifting for his job.  He is not having any numbness or tingling.  He is mostly having lateral hip pain as well as pain in the buttocks.   He has tried multiple over-the-counter remedies including NSAIDs, Tylenol as well as heat.  None of these have provided significant relief.   Mostly lateral hip and buttocks.  No aggravating gactors.  Off and on for a could of months. Much worse.  Heting pad?   Honey baked hams.    Past Medical History, Surgical History, Social History, Family History, Problem List, Medications, and Allergies have been reviewed and updated if relevant.  Patient Active Problem List   Diagnosis Date Noted  . Low back pain 12/29/2018  . NSAID-induced gastric ulcer 12/13/2018  . Pain due to onychomycosis of toenails of both feet 12/01/2018  . Pre-ulcerative calluses 12/01/2018  . Porokeratosis 12/01/2018  . 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 Hypokalemia 06/24/2018  . Alcoholic hepatitis   . Alcoholic liver disease ([place])   . Smoking 01/04/2014  . Hx of adenomatous colonic polyps 03/09/2013  . Prostate cancer screening 12/07/2012  . Routine general medical examination at a health care facility 08/23/2010  . HYPERTENSION, BENIGN ESSENTIAL 01/05/2010  . BACK PAIN, LUMBAR, WITH RADICULOPATHY 09/13/2008  . Overweight (BMI 25.0-29.9) 09/08/2007  . TINEA  PEDIS 07/16/2007  . Diabetes type 2, uncontrolled ([place]) 08/13/2006  . Hyperlipidemia 08/13/2006  . ALLERGY 08/13/2006  . Fatty liver 08/13/2006    Past Medical History:  Diagnosis Date  . 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 Diabetes mellitus    Type II  . Hyperlipidemia   . Hypertension   . NSAID-induced gastric ulcer 12/13/2018   EGD 11/2018 H pylori neg Omeprazole started  . Obesity   . Personal history of colonic adenomas 03/09/2013  . Tinea pedis    Chronic    Past  Surgical History:  Procedure Laterality Date  . COLONOSCOPY    . COLONOSCOPY W/ POLYPECTOMY  2014  . IR PARACENTESIS  07/02/2018  . IR TRANSCATHETER BX  07/17/2018  . IR VENOGRAM HEPATIC WO HEMODYNAMIC EVALUATION  07/17/2018  . myringectomy  06/2005  . POLYPECTOMY    . UMBILICAL HERNIA REPAIR  01/1997  . VASECTOMY  07/2002    Social History   Socioeconomic History  . Marital status: Married    Spouse name: Not on file  . Number of children: Not on file  . Years of education: Not on file  . Highest education level: Not on file  Occupational History  . Occupation: [employer]    Employer: [employer]  Social Needs  . Financial resource strain: Not on file  . Food insecurity    Worry: Not on file    Inability: Not on file  . Transportation needs    Medical: Not on file    Non-medical: Not on file  Tobacco Use  . Smoking status: Light Tobacco Smoker    Packs/day: 0.25  . Smokeless tobacco: Never Used  Substance and Sexual Activity  . 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 Drug use: No  . Sexual activity: Not on file  Lifestyle  . Physical activity    Days per week: Not on file    Minutes per session: Not on file  . Stress: Not on file  Relationships  . Social Herbalist on phone: Not on file    Gets together: Not on file    Attends religious service: Not on file    Active member of club or organization: Not on file    Attends meetings of clubs or organizations: Not on file    Relationship status: Not on file  . Intimate partner violence    Fear of current or ex partner: Not on file    Emotionally abused: Not on file    Physically abused: Not on file    Forced sexual activity: Not on file  Other Topics Concern  . Not on file  Social History Narrative   Married, Agricultural consultant   Smoker, heavy drinking of Canadian mist alcohol in past   No drug use     Family History  Problem Relation Age of Onset  . Hypertension Other   . Diabetes Neg Hx   . Heart disease Neg Hx   . Colon cancer Neg Hx   . Stomach cancer Neg Hx   . Pancreatic disease Neg Hx   . Colon polyps Neg Hx   . Esophageal cancer Neg Hx   . Rectal cancer Neg Hx     No Known Allergies  Medication list reviewed and updated in full in [place].  GEN: No fevers, chills. Nontoxic. Primarily MSK c/o today. MSK: Detailed in the HPI GI: tolerating PO intake without difficulty Neuro: No numbness, parasthesias, or tingling associated. Otherwise the pertinent positives of the ROS are noted above.   Objective:   BP 120/84   Pulse 88   Temp 98.6 F (37 C) (Temporal)   Ht 5' 6"  (1.676 m)   Wt 186 lb 12 oz (84.7 kg)   SpO2 98%   BMI 30.14 kg/m    GEN: WDWN, NAD, Non-toxic, Alert & Oriented x 3 HEENT: Atraumatic, Normocephalic.  Ears and Nose: No external deformity. EXTR: No clubbing/cyanosis/edema NEURO: Normal gait.  PSYCH: Normally interactive. Conversant. Not depressed or anxious appearing.  Calm demeanor.   Shoulder: R and L Inspection: No muscle wasting or winging Ecchymosis/edema: neg  AC joint, scapula, clavicle: NT Cervical spine: NT, full ROM Spurling's: neg ABNORMAL SIDE TESTED: L UNLESS OTHERWISE NOTED, THE CONTRALATERAL SIDE HAS FULL RANGE OF MOTION. Abduction: 5/5, LIMITED TO loss 40 DEGREES Flexion: 5/5, LIMITED TO loss 45 DEGNO ROM  IR, lift-off: 5/5. TESTED AT 90 DEGREES OF ABDUCTION, LIMITED TO 0 DEGREES ER at neutral:  5/5, TESTED AT 90 DEGREES OF ABDUCTION, LIMITED TO 85 DEGREES AC crossover and compression: PAIN Drop Test: neg Empty Can: neg Supraspinatus insertion: NT Bicipital groove: NT ALL OTHER SPECIAL TESTING EQUIVOCAL GIVEN LOSS OF MOTION C5-T1 intact Sensation intact Grip 5/5   Radiology: Dg Lumbar Spine Complete  Result Date: 12/29/2018 CLINICAL DATA:  Low back pain EXAM: LUMBAR SPINE - COMPLETE 4+ VIEW  COMPARISON:  None. FINDINGS: Five lumbar type vertebral segments. Vertebral body heights and alignment are maintained. No fracture identified. Intervertebral disc spaces are relatively preserved. Minimal degenerative endplate changes most pronounced at L5-S1. Mild lower lumbar facet arthrosis. IMPRESSION: Mild lower lumbar spondylosis. Electronically Signed   By: Davina Poke M.D.   On: 12/29/2018 13:29   Dg Hip Unilat With Pelvis 2-3 Views Left  Result Date: 12/29/2018 CLINICAL DATA:  Left hip pain EXAM: DG HIP (WITH OR WITHOUT PELVIS) 2-3V LEFT COMPARISON:  None. FINDINGS: No acute fracture or malalignment. Mild left hip joint space narrowing. No focal osseous lesion. IMPRESSION: Mild degenerative changes without acute osseous abnormality. Electronically Signed   By: Davina Poke M.D.   On: 12/29/2018 13:30   Assessment and Plan:     ICD-10-CM   1. Adhesive capsulitis of left shoulder  M75.02    Patient was given a systematic ROM protocol from [facility] to be done daily. Emphasized adherence and recommended formal PT. NSAID of choice prn for pain relief  Patient will be sent for formal PT for aggressive frozen shoulder ROM when ROM improves Will need RTC str and scapular stabilization to fix underlying mechanics.  Intraarticular corticosteroid injections in Adhesive Capsulitis have clearly been shown to be of benefit - if not improving at follow-up.  Follow-up: Return in about 2 months (around 04/24/2019) for f/u frozen shoulder, L.  No orders of the defined types were placed in this encounter.  No orders of the defined types were placed in this encounter.   Signed,  Maud Deed. Kadden Osterhout, MD   Outpatient Encounter Medications as of 02/22/2019  Medication Sig  . Blood Glucose Calibration (ONETOUCH VERIO) SOLN Use to calibrate meter  . Blood Glucose Monitoring Suppl (BLOOD GLUCOSE METER) kit To check glucose once daily and as needed for diabetes  . Blood Glucose Monitoring Suppl  (ONETOUCH VERIO) w/Device KIT Use to check blood sugar once daily and as needed for  DM (dx. E11.65)  . glucose blood (ONETOUCH VERIO) test strip Use to check blood sugar once daily and as needed for DM (dx. E11.65)  . meloxicam (MOBIC) 15 MG tablet Take 1 tablet (15 mg total) by mouth daily as needed for pain. With a meal  . metFORMIN (GLUCOPHAGE) 1000 MG tablet Take 1 tablet (1,000 mg total) by mouth 2 (two) times daily with a meal.  . omeprazole (PRILOSEC) 40 MG capsule Take 1 capsule (40 mg total) by mouth daily before breakfast.  . ONETOUCH DELICA LANCETS 88Q MISC 1 each by Does not apply route daily as needed. Use to check blood sugar once daily and as needed for DM (dx. E11.65)  . potassium chloride (KLOR-CON 10) 10 MEQ tablet Take 1 tablet (10 mEq total) by mouth daily.  . sitaGLIPtin (JANUVIA) 100 MG tablet Take 1 tablet (100 mg total) by mouth daily.  [place] Kitchen spironolactone (ALDACTONE) 50 MG tablet TAKE 1 TABLET(50 MG) BY MOUTH DAILY  . tadalafil (CIALIS) 20 MG tablet   . thiamine 100 MG tablet Take 1 tablet (100 mg total) by mouth daily.  . [DISCONTINUED] methocarbamol (ROBAXIN) 500 MG tablet Take 1 tablet (500 mg total) by mouth every 8 (eight) hours as needed for muscle spasms (pain). Caution of sedation  . [DISCONTINUED] predniSONE (DELTASONE) 10 MG tablet 2 tabs po for 5 days, then 1 tab po for 5 days   No facility-administered encounter medications on file as of 02/22/2019.

## 2019-02-22 ENCOUNTER — Ambulatory Visit (INDEPENDENT_AMBULATORY_CARE_PROVIDER_SITE_OTHER): Payer: Managed Care, Other (non HMO) | Admitting: Family Medicine

## 2019-02-22 ENCOUNTER — Other Ambulatory Visit: Payer: Self-pay

## 2019-02-22 ENCOUNTER — Encounter: Payer: Self-pay | Admitting: Family Medicine

## 2019-02-22 VITALS — BP 120/84 | HR 88 | Temp 98.6°F | Ht 66.0 in | Wt 186.8 lb

## 2019-02-22 DIAGNOSIS — M7502 Adhesive capsulitis of left shoulder: Secondary | ICD-10-CM

## 2019-02-23 ENCOUNTER — Encounter: Payer: Self-pay | Admitting: Family Medicine

## 2019-03-05 ENCOUNTER — Ambulatory Visit: Payer: Managed Care, Other (non HMO) | Admitting: Podiatry

## 2019-05-19 ENCOUNTER — Ambulatory Visit: Payer: Managed Care, Other (non HMO) | Admitting: Podiatry

## 2019-05-23 ENCOUNTER — Telehealth: Payer: Self-pay | Admitting: Family Medicine

## 2019-05-23 DIAGNOSIS — E7800 Pure hypercholesterolemia, unspecified: Secondary | ICD-10-CM

## 2019-05-23 DIAGNOSIS — E1165 Type 2 diabetes mellitus with hyperglycemia: Secondary | ICD-10-CM

## 2019-05-23 DIAGNOSIS — I10 Essential (primary) hypertension: Secondary | ICD-10-CM

## 2019-05-23 NOTE — Progress Notes (Signed)
Joanthan Hlavacek T. Isley Weisheit, MD Primary Care and [place] at [facility] [place], [address] Phone: [phone]  FAX: [phone]  AZZAM MEHRA - 59 y.o. male  MRN [id number]  Date of Birth: 1960/10/26  Visit Date: 05/24/2019  PCP: Abner Greenspan, MD  Referred by: Tower, Wynelle Fanny, MD  Chief Complaint  Patient presents with  . Follow-up    Left frozen shoulder    This visit occurred during the SARS-CoV-2 public health emergency.  Safety protocols were in place, including screening questions prior to the visit, additional usage of staff PPE, and extensive cleaning of exam room while observing appropriate contact time as indicated for disinfecting solutions.   Subjective:   Zachary Lewis is a 59 y.o. very pleasant male patient with Body mass index is 29.86 kg/m. who presents with the following:  As below, follow-up frozen shoulder, initial exam 2 months ago.  He was initially having some back pain as well and he had some secondary gluteus medius tendinopathy on the left, the last time I saw him he was doing pretty well with this and he also had some mild trochanteric bursitis on the side as well.  ROM is improving a lot.  He has been very compliant with his home rehab program.  He still does have some continued pain in a T-shirt distribution  Lab Results  Component Value Date   HGBA1C 8.2 (A) 02/15/2019    02/22/2019 Last OV with Owens Loffler, MD He presents with primarily some left-sided lateral adhesive capsulitis, and I saw him 2 months ago.  Multipart musculoskeletal issues.  I initially the patient did have some back pain, and subsequently he developed some secondary tendinopathy of the gluteus medius on the left.  He also has some trochanteric bursitis on the left which was mildly present on the last exam.  I did give him some low-dose prednisone and hip rehabilitation and follow-up is today to check on see how  these things are doing.  75% better compared to before and on the outside.  No back pain.  Str better.  Generally he is doing quite a bit better compared to the last time I saw him.  At this point he is developed some pain globally in the left shoulder.  Left shoulder and hurts laterally.   Recent Labs       Lab Results  Component Value Date   HGBA1C 8.2 (A) 02/15/2019      L frozen shoulder, he does have a T-shirt distribution of pain and limitation and loss of motion in all directions.  He does not have a history of trauma recently.  No significant history of any prior shoulder injuries including dislocation, fracture or surgeries.  01/04/2019 Last OV with Owens Loffler, MD  Pleasant gentleman who presents with left-sided posterior buttocks pain, lateral pain and to a lesser extent posterior leg pain ongoing for about 1 month. He is not had any known injury. There is no known aggravating factors. It is worsened here quite recently. He does work at [employer] and does quite a bit of lifting for his job. He is not having any numbness or tingling. He is mostly having lateral hip pain as well as pain in the buttocks.  He has tried multiple over-the-counter remedies including NSAIDs, Tylenol as well as heat. None of these have provided significant relief.  Mostly lateral hip and buttocks. No aggravating gactors. Off and on for  a could of months. Much worse.  Heting pad?  Honey baked hams.   Past Medical History, Surgical History, Social History, Family History, Problem List, Medications, and Allergies have been reviewed and updated if relevant.  Patient Active Problem List   Diagnosis Date Noted  . Low back pain 12/29/2018  . NSAID-induced gastric ulcer 12/13/2018  . Pain due to onychomycosis of toenails of both feet 12/01/2018  . Pre-ulcerative calluses 12/01/2018  . Porokeratosis 12/01/2018  . 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 Hypokalemia 06/24/2018  . Alcoholic hepatitis   . Alcoholic liver disease ([place])   . Smoking 01/04/2014  . Hx of adenomatous colonic polyps 03/09/2013  . Prostate cancer screening 12/07/2012  . Routine general medical examination at a health care facility 08/23/2010  . HYPERTENSION, BENIGN ESSENTIAL 01/05/2010  . BACK PAIN, LUMBAR, WITH RADICULOPATHY 09/13/2008  . Overweight (BMI 25.0-29.9) 09/08/2007  . TINEA PEDIS 07/16/2007  . Diabetes type 2, uncontrolled ([place]) 08/13/2006  . Hyperlipidemia 08/13/2006  . ALLERGY 08/13/2006  . Fatty liver 08/13/2006    Past Medical History:  Diagnosis Date  . 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 Diabetes mellitus    Type II  . Hyperlipidemia   . Hypertension   . NSAID-induced gastric ulcer 12/13/2018   EGD 11/2018 H pylori neg Omeprazole started  . Obesity   . Personal history of colonic adenomas 03/09/2013  . Tinea pedis    Chronic    Past Surgical History:  Procedure Laterality Date  . COLONOSCOPY    . COLONOSCOPY W/ POLYPECTOMY  2014  . IR PARACENTESIS  07/02/2018  . IR TRANSCATHETER BX  07/17/2018  . IR VENOGRAM HEPATIC WO HEMODYNAMIC EVALUATION  07/17/2018  . myringectomy  06/2005  . POLYPECTOMY    . UMBILICAL HERNIA REPAIR  01/1997  . VASECTOMY  07/2002    Social History   Socioeconomic History  . Marital status: Married    Spouse name: Not on file  . Number of children: Not on file  . Years of education: Not on file  . Highest education level: Not on file  Occupational History  . Occupation: [employer]    Employer: [employer]  Tobacco Use  . Smoking status: Light Tobacco Smoker    Packs/day: 0.25  . Smokeless tobacco: Never Used  Substance and Sexual Activity  . 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 Drug use: No  . Sexual  activity: Not on file  Other Topics Concern  . Not on file  Social History Narrative   Married, Agricultural consultant   Smoker, heavy drinking of [place] mist alcohol in past   No drug use   Social Determinants of Health   Financial Resource Strain:   . Difficulty of Paying Living Expenses: Not on file  Food Insecurity:   . Worried About Charity fundraiser in the Last Year: Not on file  . Ran Out of Food in the Last Year: Not on file  Transportation Needs:   . Lack of Transportation (Medical): Not on file  . Lack of Transportation (Non-Medical): Not on file  Physical Activity:   . Days of Exercise per Week: Not on file  . Minutes of Exercise per Session: Not on file  Stress:   .  Feeling of Stress : Not on file  Social Connections:   . Frequency of Communication with Friends and Family: Not on file  . Frequency of Social Gatherings with Friends and Family: Not on file  . Attends Religious Services: Not on file  . Active Member of Clubs or Organizations: Not on file  . Attends Archivist Meetings: Not on file  . Marital Status: Not on file  Intimate Partner Violence:   . Fear of Current or Ex-Partner: Not on file  . Emotionally Abused: Not on file  . Physically Abused: Not on file  . Sexually Abused: Not on file    Family History  Problem Relation Age of Onset  . Hypertension Other   . Diabetes Neg Hx   . Heart disease Neg Hx   . Colon cancer Neg Hx   . Stomach cancer Neg Hx   . Pancreatic disease Neg Hx   . Colon polyps Neg Hx   . Esophageal cancer Neg Hx   . Rectal cancer Neg Hx     No Known Allergies  Medication list reviewed and updated in full in [place].  GEN: No fevers, chills. Nontoxic. Primarily MSK c/o today. MSK: Detailed in the HPI GI: tolerating PO intake without difficulty Neuro: No numbness, parasthesias, or tingling associated. Otherwise the pertinent positives of the ROS are noted above.   Objective:   BP  100/72   Pulse 86   Temp 98.3 F (36.8 C) (Temporal)   Ht 5' 6"  (1.676 m)   Wt 185 lb (83.9 kg)   SpO2 99%   BMI 29.86 kg/m    GEN: WDWN, NAD, Non-toxic, Alert & Oriented x 3 HEENT: Atraumatic, Normocephalic.  Ears and Nose: No external deformity. EXTR: No clubbing/cyanosis/edema NEURO: Normal gait.  PSYCH: Normally interactive. Conversant. Not depressed or anxious appearing.  Calm demeanor.   Shoulder: R and L Inspection: No muscle wasting or winging Ecchymosis/edema: neg  AC joint, scapula, clavicle: NT Cervical spine: NT, full ROM Spurling's: neg ABNORMAL SIDE TESTED: l UNLESS OTHERWISE NOTED, THE CONTRALATERAL SIDE HAS FULL RANGE OF MOTION. Abduction: 5/5, LIMITED TO to approximately 160 DEGREES Flexion: 5/5, LIMITED TO TO APPROXIMATELY 165 DEGNO ROM  IR, lift-off: 5/5. TESTED AT 90 DEGREES OF ABDUCTION, LIMITED TO about 5 degrees DEGREES ER at neutral:  5/5, TESTED AT 90 DEGREES OF ABDUCTION, LIMITED TO grossly 60 DEGREES AC crossover and compression: PAIN Drop Test: neg Empty Can: neg Supraspinatus insertion: NT Bicipital groove: NT ALL OTHER SPECIAL TESTING EQUIVOCAL GIVEN LOSS OF MOTION C5-T1 intact Sensation intact Grip 5/5   Radiology: No results found.  Assessment and Plan:     ICD-10-CM   1. Adhesive capsulitis of left shoulder  M75.02 methylPREDNISolone acetate (DEPO-MEDROL) injection 80 mg   He is making good progress, and has been compliant with his rehab program.  Continue with this.  Intra-articular shoulder injection today.  Intraarticular Shoulder Aspiration/Injection Procedure Note NORVELL CASWELL July 16, 1960 Date of procedure: 05/24/2019  Procedure: Large Joint Aspiration / Injection of Shoulder, Intraarticular, LEFT Indications: Pain  Procedure Details Verbal consent was obtained from the patient. Risks including infection explained and contrasted with benefits and alternatives. Patient prepped with Chloraprep and Ethyl Chloride used for  anesthesia. An intraarticular shoulder injection was performed using the posterior approach; needle placed into joint capsule without difficulty. The patient tolerated the procedure well and had decreased pain post injection. No complications. Injection: 8 cc of Lidocaine 1% and 2 mL Depo-Medrol 40 mg. Needle: 21  gauge, 2 inch   Follow-up: Return in about 2 months (around 07/22/2019).  Meds ordered this encounter  Medications  . methylPREDNISolone acetate (DEPO-MEDROL) injection 80 mg   No orders of the defined types were placed in this encounter.   Signed,  Maud Deed. Graycie Halley, MD   Outpatient Encounter Medications as of 05/24/2019  Medication Sig  . Blood Glucose Calibration (ONETOUCH VERIO) SOLN Use to calibrate meter  . Blood Glucose Monitoring Suppl (BLOOD GLUCOSE METER) kit To check glucose once daily and as needed for diabetes  . Blood Glucose Monitoring Suppl (ONETOUCH VERIO) w/Device KIT Use to check blood sugar once daily and as needed for DM (dx. E11.65)  . glucose blood (ONETOUCH VERIO) test strip Use to check blood sugar once daily and as needed for DM (dx. E11.65)  . meloxicam (MOBIC) 15 MG tablet Take 1 tablet (15 mg total) by mouth daily as needed for pain. With a meal  . metFORMIN (GLUCOPHAGE) 1000 MG tablet Take 1 tablet (1,000 mg total) by mouth 2 (two) times daily with a meal.  . omeprazole (PRILOSEC) 40 MG capsule Take 1 capsule (40 mg total) by mouth daily before breakfast.  . ONETOUCH DELICA LANCETS 78E MISC 1 each by Does not apply route daily as needed. Use to check blood sugar once daily and as needed for DM (dx. E11.65)  . potassium chloride (KLOR-CON 10) 10 MEQ tablet Take 1 tablet (10 mEq total) by mouth daily.  . sitaGLIPtin (JANUVIA) 100 MG tablet Take 1 tablet (100 mg total) by mouth daily.  [place] spironolactone (ALDACTONE) 50 MG tablet TAKE 1 TABLET(50 MG) BY MOUTH DAILY  . tadalafil (CIALIS) 20 MG tablet   . thiamine 100 MG tablet Take 1 tablet (100 mg total)  by mouth daily.  . [EXPIRED] methylPREDNISolone acetate (DEPO-MEDROL) injection 80 mg    No facility-administered encounter medications on file as of 05/24/2019.

## 2019-05-23 NOTE — Telephone Encounter (Signed)
-----   Message from Ellamae Sia sent at 05/13/2019  9:47 AM EST ----- Regarding: Lab orders for Monday, 1.4.21 Lab orders for a 3 month follow up appt.

## 2019-05-24 ENCOUNTER — Other Ambulatory Visit: Payer: Self-pay

## 2019-05-24 ENCOUNTER — Encounter: Payer: Self-pay | Admitting: Family Medicine

## 2019-05-24 ENCOUNTER — Ambulatory Visit (INDEPENDENT_AMBULATORY_CARE_PROVIDER_SITE_OTHER): Payer: Managed Care, Other (non HMO) | Admitting: Family Medicine

## 2019-05-24 ENCOUNTER — Other Ambulatory Visit (INDEPENDENT_AMBULATORY_CARE_PROVIDER_SITE_OTHER): Payer: Managed Care, Other (non HMO)

## 2019-05-24 VITALS — BP 100/72 | HR 86 | Temp 98.3°F | Ht 66.0 in | Wt 185.0 lb

## 2019-05-24 DIAGNOSIS — M7502 Adhesive capsulitis of left shoulder: Secondary | ICD-10-CM

## 2019-05-24 DIAGNOSIS — E7800 Pure hypercholesterolemia, unspecified: Secondary | ICD-10-CM

## 2019-05-24 DIAGNOSIS — E1165 Type 2 diabetes mellitus with hyperglycemia: Secondary | ICD-10-CM | POA: Diagnosis not present

## 2019-05-24 DIAGNOSIS — R17 Unspecified jaundice: Secondary | ICD-10-CM | POA: Diagnosis not present

## 2019-05-24 DIAGNOSIS — I10 Essential (primary) hypertension: Secondary | ICD-10-CM | POA: Diagnosis not present

## 2019-05-24 LAB — COMPREHENSIVE METABOLIC PANEL
ALT: 25 U/L (ref 0–53)
AST: 21 U/L (ref 0–37)
Albumin: 4.5 g/dL (ref 3.5–5.2)
Alkaline Phosphatase: 158 U/L — ABNORMAL HIGH (ref 39–117)
BUN: 18 mg/dL (ref 6–23)
CO2: 23 mEq/L (ref 19–32)
Calcium: 10.4 mg/dL (ref 8.4–10.5)
Chloride: 102 mEq/L (ref 96–112)
Creatinine, Ser: 0.74 mg/dL (ref 0.40–1.50)
GFR: 130.66 mL/min (ref >60.00)
Glucose, Bld: 209 mg/dL — ABNORMAL HIGH (ref 70–99)
Potassium: 4.3 mEq/L (ref 3.5–5.1)
Sodium: 136 mEq/L (ref 135–145)
Total Bilirubin: 0.4 mg/dL (ref 0.2–1.2)
Total Protein: 8.1 g/dL (ref 6.0–8.3)

## 2019-05-24 LAB — LIPID PANEL
Cholesterol: 252 mg/dL — ABNORMAL HIGH (ref 0–200)
HDL: 64.5 mg/dL (ref >39.00)
LDL Cholesterol: 163 mg/dL — ABNORMAL HIGH (ref 0–99)
NonHDL: 187.61
Total CHOL/HDL Ratio: 4
Triglycerides: 123 mg/dL (ref 0.0–149.0)
VLDL: 24.6 mg/dL (ref 0.0–40.0)

## 2019-05-24 LAB — HEMOGLOBIN A1C: Hgb A1c MFr Bld: 10 % — ABNORMAL HIGH (ref 4.6–6.5)

## 2019-05-24 MED ORDER — METHYLPREDNISOLONE ACETATE 40 MG/ML IJ SUSP
80.0000 mg | Freq: Once | INTRAMUSCULAR | Status: AC
  Administered 2019-05-24: 80 mg via INTRA_ARTICULAR

## 2019-05-24 NOTE — Addendum Note (Signed)
Addended by: Ellamae Sia on: 05/24/2019 10:20 AM   Modules accepted: Orders

## 2019-05-25 ENCOUNTER — Other Ambulatory Visit: Payer: Managed Care, Other (non HMO)

## 2019-05-28 ENCOUNTER — Ambulatory Visit: Payer: Managed Care, Other (non HMO) | Admitting: Family Medicine

## 2019-06-24 ENCOUNTER — Other Ambulatory Visit: Payer: Self-pay

## 2019-06-24 ENCOUNTER — Encounter: Payer: Self-pay | Admitting: Family Medicine

## 2019-06-24 ENCOUNTER — Ambulatory Visit (INDEPENDENT_AMBULATORY_CARE_PROVIDER_SITE_OTHER): Payer: Managed Care, Other (non HMO) | Admitting: Family Medicine

## 2019-06-24 VITALS — BP 134/86 | HR 91 | Temp 97.6°F | Ht 66.0 in | Wt 187.2 lb

## 2019-06-24 DIAGNOSIS — I10 Essential (primary) hypertension: Secondary | ICD-10-CM

## 2019-06-24 DIAGNOSIS — E1165 Type 2 diabetes mellitus with hyperglycemia: Secondary | ICD-10-CM | POA: Diagnosis not present

## 2019-06-24 DIAGNOSIS — Z6830 Body mass index (BMI) 30.0-30.9, adult: Secondary | ICD-10-CM

## 2019-06-24 DIAGNOSIS — E1169 Type 2 diabetes mellitus with other specified complication: Secondary | ICD-10-CM

## 2019-06-24 DIAGNOSIS — E6609 Other obesity due to excess calories: Secondary | ICD-10-CM | POA: Diagnosis not present

## 2019-06-24 DIAGNOSIS — K7460 Unspecified cirrhosis of liver: Secondary | ICD-10-CM

## 2019-06-24 DIAGNOSIS — R188 Other ascites: Secondary | ICD-10-CM

## 2019-06-24 DIAGNOSIS — E785 Hyperlipidemia, unspecified: Secondary | ICD-10-CM

## 2019-06-24 DIAGNOSIS — K766 Portal hypertension: Secondary | ICD-10-CM

## 2019-06-24 DIAGNOSIS — F17200 Nicotine dependence, unspecified, uncomplicated: Secondary | ICD-10-CM

## 2019-06-24 DIAGNOSIS — K3189 Other diseases of stomach and duodenum: Secondary | ICD-10-CM | POA: Insufficient documentation

## 2019-06-24 DIAGNOSIS — Z8719 Personal history of other diseases of the digestive system: Secondary | ICD-10-CM

## 2019-06-24 MED ORDER — POTASSIUM CHLORIDE ER 10 MEQ PO TBCR: 10.0000 meq | EXTENDED_RELEASE_TABLET | Freq: Every day | ORAL | 3 refills | Status: DC

## 2019-06-24 MED ORDER — METFORMIN HCL 1000 MG PO TABS: 1000.0000 mg | ORAL_TABLET | Freq: Two times a day (BID) | ORAL | 3 refills | Status: DC

## 2019-06-24 MED ORDER — SPIRONOLACTONE 50 MG PO TABS: ORAL_TABLET | ORAL | 3 refills | Status: DC

## 2019-06-24 NOTE — Assessment & Plan Note (Signed)
bp in fair control at this time  BP Readings from Last 1 Encounters:  06/24/19 134/86   No changes needed Most recent labs reviewed  Disc lifstyle change with low sodium diet and exercise

## 2019-06-24 NOTE — Assessment & Plan Note (Signed)
LFTs are stable No alcohol on over a year Needs to loose wt

## 2019-06-24 NOTE — Assessment & Plan Note (Signed)
Lab Results  Component Value Date   HGBA1C 10.0 (H) 05/24/2019   This is up significantly  Diet varies Good exercise  Regular podiatry visits for foot care  Unable to afford [place] Taking metformin  Can not have sulfonurea due to liver dz Eye exam planned next mo  microalb is elevated  Cannot take statin due to liver dz Referred to endocrinology for help with management

## 2019-06-24 NOTE — Assessment & Plan Note (Signed)
Discussed how this problem influences overall health and the risks it imposes  Reviewed plan for weight loss with lower calorie diet (via better food choices and also portion control or program like weight watchers) and exercise building up to or more than 30 minutes 5 days per week including some aerobic activity   Commended on good exercise

## 2019-06-24 NOTE — Assessment & Plan Note (Signed)
Not controlled Disc goals for lipids and reasons to control them Rev last labs with pt Rev low sat fat diet in detail Unable to take statin in light of liver dz

## 2019-06-24 NOTE — Progress Notes (Signed)
Subjective:    Patient ID: Zachary Lewis, male    DOB: [birth year], 60 y.o.   MRN: [id number]  This visit occurred during the SARS-CoV-2 public health emergency.  Safety protocols were in place, including screening questions prior to the visit, additional usage of staff PPE, and extensive cleaning of exam room while observing appropriate contact time as indicated for disinfecting solutions.    HPI Pt presents for f/u of chronic health problems   Wt Readings from Last 3 Encounters:  06/24/19 187 lb 3 oz (84.9 kg)  05/24/19 185 lb (83.9 kg)  02/22/19 186 lb 12 oz (84.7 kg)  about the same  30.21 kg/m  Feeling great   Smoking status - has a patch  Is almost quit- working on it  Had 2 cigarettes yesterday   bp is stable today  No cp or palpitations or headaches or edema  No side effects to medicines  BP Readings from Last 3 Encounters:  06/24/19 134/86  05/24/19 100/72  02/22/19 120/84     DM2 Lab Results  Component Value Date   HGBA1C 10.0 (H) 05/24/2019  really sticking to a diabetes diet now (was bad over the holidays)  Also eating on the run Exercise    Treadmill and bike over an hour per day and lifting weights  This is up from 8.2  Metformin 1000 mg bid  januvia 100 mg - off of for a month since insurance did not pay after jan 1   Cannot take sulfonurea due to liver dz   No etoh at all   Foot care-had podiatry visit - to work on calluses /goes back early march Eye care - my Energy manager in march planned as well    Lab Results  Component Value Date   MICROALBUR 3.5 (H) 02/15/2019   MICROALBUR 5.9 (H) 09/19/2009    Hyperlipidemia Lab Results  Component Value Date   CHOL 252 (H) 05/24/2019   CHOL 224 (H) 10/29/2018   CHOL 574 (H) 05/29/2018   Lab Results  Component Value Date   HDL 64.50 05/24/2019   HDL 50.20 10/29/2018   HDL 14.20 (L) 05/29/2018   Lab Results  Component Value Date   LDLCALC 163 (H) 05/24/2019   LDLCALC 154 (H) 10/29/2018   LDLCALC 522 (H) 05/29/2018   Lab Results  Component Value Date   TRIG 123.0 05/24/2019   TRIG 102.0 10/29/2018   TRIG 190.0 (H) 05/29/2018   Lab Results  Component Value Date   CHOLHDL 4 05/24/2019   CHOLHDL 4 10/29/2018   CHOLHDL 40 05/29/2018   Lab Results  Component Value Date   LDLDIRECT 176.9 12/08/2012   LDLDIRECT 195.5 02/17/2012   LDLDIRECT 178.4 10/30/2011   Cannot take statin due to liver disease   Eating healthy overall    H/o alcoholic hepatitis Lab Results  Component Value Date   ALT 25 05/24/2019   AST 21 05/24/2019   ALKPHOS 158 (H) 05/24/2019   BILITOT 0.4 05/24/2019     Patient Active Problem List   Diagnosis Date Noted  . Portal hypertension ([place]) 06/24/2019  . Low back pain 12/29/2018  . NSAID-induced gastric ulcer 12/13/2018  . Pain due to onychomycosis of toenails of both feet 12/01/2018  . Pre-ulcerative calluses 12/01/2018  . Porokeratosis 12/01/2018  . 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 Hypokalemia 06/24/2018  . History of alcoholic hepatitis   . Alcoholic liver  disease ([place])   . Smoking 01/04/2014  . Hx of adenomatous colonic polyps 03/09/2013  . Prostate cancer screening 12/07/2012  . Routine general medical examination at a health care facility 08/23/2010  . Obesity   . HYPERTENSION, BENIGN ESSENTIAL 01/05/2010  . BACK PAIN, LUMBAR, WITH RADICULOPATHY 09/13/2008  . TINEA PEDIS 07/16/2007  . Diabetes type 2, uncontrolled ([place]) 08/13/2006  . Hyperlipidemia associated with type 2 diabetes mellitus ([place]) 08/13/2006  . ALLERGY 08/13/2006  . Fatty liver 08/13/2006   Past Medical History:  Diagnosis Date  . 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 Diabetes mellitus    Type II  . Hyperlipidemia   . Hypertension   . NSAID-induced gastric ulcer 12/13/2018   EGD 11/2018 H pylori neg Omeprazole started  .  Obesity   . Personal history of colonic adenomas 03/09/2013  . Tinea pedis    Chronic   Past Surgical History:  Procedure Laterality Date  . COLONOSCOPY    . COLONOSCOPY W/ POLYPECTOMY  2014  . IR PARACENTESIS  07/02/2018  . IR TRANSCATHETER BX  07/17/2018  . IR VENOGRAM HEPATIC WO HEMODYNAMIC EVALUATION  07/17/2018  . myringectomy  06/2005  . POLYPECTOMY    . UMBILICAL HERNIA REPAIR  01/1997  . VASECTOMY  07/2002   Social History   Tobacco Use  . Smoking status: Light Tobacco Smoker    Packs/day: 0.25  . Smokeless tobacco: Never Used  Substance Use Topics  . 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 Drug use: No   Family History  Problem Relation Age of Onset  . Hypertension Other   . Diabetes Neg Hx   . Heart disease Neg Hx   . Colon cancer Neg Hx   . Stomach cancer Neg Hx   . Pancreatic disease Neg Hx   . Colon polyps Neg Hx   . Esophageal cancer Neg Hx   . Rectal cancer Neg Hx    No Known Allergies Current Outpatient Medications on File Prior to Visit  Medication Sig Dispense Refill  . Blood Glucose Calibration (ONETOUCH VERIO) SOLN Use to calibrate meter 1 each 3  . Blood Glucose Monitoring Suppl (BLOOD GLUCOSE METER) kit To check glucose once daily and as needed for diabetes 1 each 0  . Blood Glucose Monitoring Suppl (ONETOUCH VERIO) w/Device KIT Use to check blood sugar once daily and as needed for DM (dx. E11.65) 1 kit 0  . glucose blood (ONETOUCH VERIO) test strip Use to check blood sugar once daily and as needed for DM (dx. E11.65) 100 each 3  . meloxicam (MOBIC) 15 MG tablet Take 1 tablet (15 mg total) by mouth daily as needed for pain. With a meal 30 tablet 1  . omeprazole (PRILOSEC) 40 MG capsule Take 1 capsule (40 mg total) by mouth daily before breakfast. 90 capsule 0  . ONETOUCH DELICA LANCETS 37J MISC 1 each by Does not apply route daily as needed. Use to check blood sugar once  daily and as needed for DM (dx. E11.65) 100 each 3  . tadalafil (CIALIS) 20 MG tablet     . thiamine 100 MG tablet Take 1 tablet (100 mg total) by mouth daily. 30 tablet 0   No current facility-administered medications on file prior to visit.    Review of Systems  Constitutional: Negative for activity change, appetite change, fatigue, fever and  unexpected weight change.  HENT: Negative for congestion, rhinorrhea, sore throat and trouble swallowing.   Eyes: Negative for pain, redness, itching and visual disturbance.  Respiratory: Negative for cough, chest tightness, shortness of breath and wheezing.   Cardiovascular: Negative for chest pain and palpitations.  Gastrointestinal: Negative for abdominal pain, blood in stool, constipation, diarrhea and nausea.  Endocrine: Negative for cold intolerance, heat intolerance, polydipsia and polyuria.  Genitourinary: Negative for difficulty urinating, dysuria, frequency and urgency.  Musculoskeletal: Negative for arthralgias, joint swelling and myalgias.  Skin: Negative for pallor and rash.  Neurological: Negative for dizziness, tremors, weakness, numbness and headaches.  Hematological: Negative for adenopathy. Does not bruise/bleed easily.  Psychiatric/Behavioral: Negative for decreased concentration and dysphoric mood. The patient is not nervous/anxious.        Objective:   Physical Exam Constitutional:      General: He is not in acute distress.    Appearance: Normal appearance. He is well-developed. He is obese. He is not ill-appearing or diaphoretic.  HENT:     Head: Normocephalic and atraumatic.  Eyes:     General: No scleral icterus.    Conjunctiva/sclera: Conjunctivae normal.     Pupils: Pupils are equal, round, and reactive to light.  Neck:     Thyroid: No thyromegaly.     Vascular: No carotid bruit or JVD.  Cardiovascular:     Rate and Rhythm: Normal rate and regular rhythm.     Heart sounds: Normal heart sounds. No gallop.     Pulmonary:     Effort: Pulmonary effort is normal. No respiratory distress.     Breath sounds: Normal breath sounds. No wheezing or rales.  Abdominal:     General: Bowel sounds are normal. There is no distension or abdominal bruit.     Palpations: Abdomen is soft. There is no mass.     Tenderness: There is no abdominal tenderness. There is no guarding or rebound.     Hernia: No hernia is present.     Comments: No ascites evident today  Musculoskeletal:     Cervical back: Normal range of motion and neck supple.     Right lower leg: No edema.     Left lower leg: No edema.  Lymphadenopathy:     Cervical: No cervical adenopathy.  Skin:    General: Skin is warm and dry.     Coloration: Skin is not jaundiced or pale.     Findings: No erythema or rash.  Neurological:     Mental Status: He is alert.     Sensory: No sensory deficit.     Coordination: Coordination normal.     Deep Tendon Reflexes: Reflexes are normal and symmetric. Reflexes normal.  Psychiatric:        Mood and Affect: Mood normal.           Assessment & Plan:   Problem List Items Addressed This Visit      Cardiovascular and Mediastinum   HYPERTENSION, BENIGN ESSENTIAL    bp in fair control at this time  BP Readings from Last 1 Encounters:  06/24/19 134/86   No changes needed Most recent labs reviewed  Disc lifstyle change with low sodium diet and exercise        Relevant Medications   spironolactone (ALDACTONE) 50 MG tablet   Portal hypertension ([place])    In pt with h/o alcoholic hepatitis  No longer symptomatic bp is controlled       Relevant Medications   spironolactone (ALDACTONE) 50  MG tablet     Digestive   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[place])    LFTs are stable No alcohol on over a year Needs to loose wt        Endocrine   Diabetes type 2, uncontrolled ([place]) - Primary    Lab Results  Component Value Date   HGBA1C 10.0 (H) 05/24/2019   This is up  significantly  Diet varies Good exercise  Regular podiatry visits for foot care  Unable to afford januvia Taking metformin  Can not have sulfonurea due to liver dz Eye exam planned next mo  microalb is elevated  Cannot take statin due to liver dz Referred to endocrinology for help with management       Relevant Medications   metFORMIN (GLUCOPHAGE) 1000 MG tablet   Other Relevant Orders   Ambulatory referral to Endocrinology   Hyperlipidemia associated with type 2 diabetes mellitus ([place])    Not controlled Disc goals for lipids and reasons to control them Rev last labs with pt Rev low sat fat diet in detail Unable to take statin in light of liver dz       Relevant Medications   metFORMIN (GLUCOPHAGE) 1000 MG tablet     Other   Obesity    Discussed how this problem influences overall health and the risks it imposes  Reviewed plan for weight loss with lower calorie diet (via better food choices and also portion control or program like weight watchers) and exercise building up to or more than 30 minutes 5 days per week including some aerobic activity   Commended on good exercise       Relevant Medications   metFORMIN (GLUCOPHAGE) 1000 MG tablet   Smoking    Disc in detail risks of smoking and possible outcomes including copd, vascular/ heart disease, cancer , respiratory and sinus infections  Pt voices understanding Pt is cutting down with patch No quit date       History of alcoholic hepatitis    Improved No etoh in over a year  Better self care No symptoms

## 2019-06-24 NOTE — Assessment & Plan Note (Signed)
Improved No etoh in over a year  Better self care No symptoms

## 2019-06-24 NOTE — Assessment & Plan Note (Signed)
Disc in detail risks of smoking and possible outcomes including copd, vascular/ heart disease, cancer , respiratory and sinus infections  Pt voices understanding Pt is cutting down with patch No quit date

## 2019-06-24 NOTE — Patient Instructions (Addendum)
For diabetes- continue diabetes diet and exercise   We will call you regarding a referral to endocrinology for help with diabetes care   See the foot doctor as scheduled  See the eye doctor as planned- ask them to send [place] a copy   For cholesterol  Avoid red meat/ fried foods/ egg yolks/ fatty breakfast meats/ butter, cheese and high fat dairy/ and shellfish

## 2019-06-24 NOTE — Assessment & Plan Note (Signed)
In pt with h/o alcoholic hepatitis  No longer symptomatic bp is controlled

## 2019-06-29 ENCOUNTER — Telehealth: Payer: Self-pay

## 2019-06-29 NOTE — Telephone Encounter (Signed)
Pt left v/m pt was seen at [facility] on 06/24/19 for FU. Pt just found out that visitors in pts store on 06/22/19 tested positive for covid on 06/24/19. Pt wants to know what he should do. I left v/m for pt to cb to get more info; was pt and visitors wearing a mask and doing social distancing. Does pt have any covid symptoms. Will wait for pts cb.

## 2019-06-29 NOTE — Telephone Encounter (Signed)
He can get tested if he wants to - as long as it is 5 or more days after the potential exposure.  If symptoms develop he should certainly get tested   Please give info for him to schedule Thanks

## 2019-06-29 NOTE — Telephone Encounter (Addendum)
Pt notified of Dr. Marliss Coots comments and verbalized understanding, testing info given

## 2019-06-29 NOTE — Telephone Encounter (Signed)
Patient contacted the office back. He states that the visitors and himself were wearing a mask and they were social distancing. Patient is not experiencing any COVID sx.  Dr. Glori Bickers, any suggestions?

## 2019-07-15 ENCOUNTER — Telehealth: Payer: Self-pay | Admitting: Family Medicine

## 2019-07-15 DIAGNOSIS — E1165 Type 2 diabetes mellitus with hyperglycemia: Secondary | ICD-10-CM

## 2019-07-15 NOTE — Telephone Encounter (Signed)
Dt Tower , Please place a New Endocrinology Referral , the one you placed accidentally got canceled.

## 2019-07-15 NOTE — Telephone Encounter (Signed)
Done

## 2019-07-21 ENCOUNTER — Ambulatory Visit (INDEPENDENT_AMBULATORY_CARE_PROVIDER_SITE_OTHER): Payer: Managed Care, Other (non HMO) | Admitting: Podiatry

## 2019-07-21 ENCOUNTER — Encounter: Payer: Self-pay | Admitting: Podiatry

## 2019-07-21 ENCOUNTER — Other Ambulatory Visit: Payer: Self-pay

## 2019-07-21 DIAGNOSIS — M79674 Pain in right toe(s): Secondary | ICD-10-CM

## 2019-07-21 DIAGNOSIS — B351 Tinea unguium: Secondary | ICD-10-CM

## 2019-07-21 DIAGNOSIS — E1165 Type 2 diabetes mellitus with hyperglycemia: Secondary | ICD-10-CM

## 2019-07-21 DIAGNOSIS — M79675 Pain in left toe(s): Secondary | ICD-10-CM

## 2019-07-21 DIAGNOSIS — Q828 Other specified congenital malformations of skin: Secondary | ICD-10-CM | POA: Diagnosis not present

## 2019-07-21 DIAGNOSIS — L84 Corns and callosities: Secondary | ICD-10-CM

## 2019-07-21 NOTE — Progress Notes (Signed)
Complaint:  Visit Type: Patient returns to my office for continued preventative foot care services. Complaint: Patient states" my nails have grown long and thick and become painful to walk and wear shoes"  Patient also has multiple callus of his foot  including his heels. Patient has been diagnosed with DM with no foot complications. The patient presents for preventative foot care services.  Patient has not been seen in over 7 months.  Podiatric Exam: Vascular: dorsalis pedis and posterior tibial pulses are palpable bilateral. Capillary return is immediate. Temperature gradient is WNL. Skin turgor WNL  Sensorium: Normal Semmes Weinstein monofilament test. Normal tactile sensation bilaterally. Nail Exam: Pt has thick disfigured discolored nails with subungual debris noted bilateral entire nail hallux through fifth toenails Ulcer Exam: There is no evidence of ulcer or pre-ulcerative changes or infection. Orthopedic Exam: Muscle tone and strength are WNL. No limitations in general ROM. No crepitus or effusions noted. Foot type and digits show no abnormalities. Bony prominences are unremarkable.  Plantar flexed fifth metatarsals  B/l. Skin:  Porokeratosis sub 1,5  B/L.  Heel callus lateral aspect both heels.  . No infection or ulcers  Diagnosis:  Onychomycosis, , Pain in right toe, pain in left toes  Debride callus  B/L.  Treatment & Plan Procedures and Treatment: Consent by patient was obtained for treatment procedures.   Debridement of mycotic and hypertrophic toenails, 1 through 5 bilateral and clearing of subungual debris. No ulceration, no infection noted. Debridement of callus  B/L. Return Visit-Office Procedure: Patient instructed to return to the office for a follow up visit 3 months for continued evaluation and treatment.    Gardiner Barefoot DPM

## 2019-07-22 LAB — HM DIABETES EYE EXAM

## 2019-07-26 ENCOUNTER — Ambulatory Visit: Payer: Managed Care, Other (non HMO) | Admitting: Family Medicine

## 2019-07-27 NOTE — Progress Notes (Signed)
   Spencer T. Copland, MD Primary Care and Sports Medicine  [facility] [address] [place], [address] Phone: [phone]  FAX: [phone]  Zachary Lewis - 58 y.o. male  MRN [id number]  Date of Birth: 08/02/1960  Visit Date: 07/28/2019  PCP: Tower, Marne A, MD  Referred by: Tower, Marne A, MD   F/u L shoulder: Chief complaint  This visit occurred during the SARS-CoV-2 public health emergency.  Safety protocols were in place, including screening questions prior to the visit, additional usage of staff PPE, and extensive cleaning of exam room while observing appropriate contact time as indicated for disinfecting solutions.   Subjective:   Zachary Lewis is a 58 y.o. very pleasant male patient with Body mass index is 30.26 kg/m. who presents with the following:  He is here to follow-up on left-sided adhesive capsulitis.  I initially saw him in October 2020.  On his last office visit in May 23, 2018 I did do a intra-articular shoulder injection.  He has been doing some ongoing rehab for range of motion.  This is with the [facility] protocol.  On his last office visit his abduction and flexion was limited to approximately 160 degrees.  He is still having some pain, but his range of motion is improved intervally quite a bit compared to last time.  He was being quite compliant with his range of motion for his shoulder.  05/24/2019 Last OV with Spencer Copland, MD  As below, follow-up frozen shoulder, initial exam 2 months ago.  He was initially having some back pain as well and he had some secondary gluteus medius tendinopathy on the left, the last time I saw him he was doing pretty well with this and he also had some mild trochanteric bursitis on the side as well.  ROM is improving a lot.  He has been very compliant with his home rehab program.  He still does have some continued pain in a T-shirt distribution  Recent Labs       Lab  Results  Component Value Date   HGBA1C 8.2 (A) 02/15/2019      02/22/2019 Last OV with Spencer Copland, MD He presents with primarily some left-sided lateral adhesive capsulitis, and I saw him 2 months ago.  Multipart musculoskeletal issues. I initially the patient did have some back pain, and subsequently he developed some secondary tendinopathy of the gluteus medius on the left. He also has some trochanteric bursitis on the left which was mildly present on the last exam.  I did give him some low-dose prednisone and hip rehabilitation and follow-up is today to check on see how these things are doing.  75% better compared to before and on the outside. No back pain.  Str better. Generally he is doing quite a bit better compared to the last time I saw him. At this point he is developed some pain globally in the left shoulder.  Left shoulder and hurts laterally. Recent Labs       Lab Results  Component Value Date   HGBA1C 8.2 (A) 02/15/2019     L frozen shoulder,he does have a T-shirt distribution of pain and limitation and loss of motion in all directions. He does not have a history of trauma recently. No significant history of any prior shoulder injuries including dislocation, fracture or surgeries.  01/04/2019 Last OV with Spencer Copland, MD Pleasant gentleman who presents with left-sided posterior buttocks pain, lateral pain and to a lesser   extent posterior leg pain ongoing for about 1 month. He is not had any known injury. There is no known aggravating factors. It is worsened here quite recently. He does work at [employer] and does quite a bit of lifting for his job. He is not having any numbness or tingling. He is mostly having lateral hip pain as well as pain in the buttocks.  He has tried multiple over-the-counter remedies including NSAIDs, Tylenol as well as heat. None of these have provided significant relief.  Mostly lateral hip and  buttocks. No aggravating gactors. Off and on for a could of months. Much worse.  Heting pad?  [employer].   Review of Systems is noted in the HPI, as appropriate   Objective:   BP 120/72   Pulse 82   Temp 98.2 F (36.8 C) (Temporal)   Ht 5' 6" (1.676 m)   Wt 187 lb 8 oz (85 kg)   SpO2 99%   BMI 30.26 kg/m   GEN: No acute distress; alert,appropriate. PULM: Breathing comfortably in no respiratory distress PSYCH: Normally interactive.    Nontender at the AC joint.  Nontender at the bicipital groove.  The patient's range of motion in flexion is approximately 170 degrees.  External range of motion is to 75 degrees and internal range of motion is 20 degrees.  Strength is 5/5 in all directions and he is neurovascularly intact in both upper extremities.  Radiology: No results found.  Assessment and Plan:     ICD-10-CM   1. Adhesive capsulitis of left shoulder  M75.02 methylPREDNISolone acetate (DEPO-MEDROL) injection 80 mg    He has done very well and been compliant.  Is making good progress.  To help with his aggressive rehab protocol, and asking him to do this daily.  We have also been to do an additional intra-articular shoulder injection.  Intraarticular Shoulder Aspiration/Injection Procedure Note Zachary Lewis 12/18/1960 Date of procedure: 07/28/2019  Procedure: Large Joint Aspiration / Injection of Shoulder, Intraarticular, L Indications: Pain  Procedure Details Verbal consent was obtained from the patient. Risks including infection explained and contrasted with benefits and alternatives. Patient prepped with Chloraprep and Ethyl Chloride used for anesthesia. An intraarticular shoulder injection was performed using the posterior approach; needle placed into joint capsule without difficulty. The patient tolerated the procedure well and had decreased pain post injection. No complications. Injection: 8 cc of Lidocaine 1% and 2 mL Depo-Medrol 40 mg. Needle: 21  gauge, 2 inch   Follow-up: prn only  Meds ordered this encounter  Medications  . methylPREDNISolone acetate (DEPO-MEDROL) injection 80 mg   There are no discontinued medications. No orders of the defined types were placed in this encounter.   Signed,  Spencer T. Copland, MD   Outpatient Encounter Medications as of 07/28/2019  Medication Sig  . Blood Glucose Calibration (ONETOUCH VERIO) SOLN Use to calibrate meter  . Blood Glucose Monitoring Suppl (BLOOD GLUCOSE METER) kit To check glucose once daily and as needed for diabetes  . Blood Glucose Monitoring Suppl (ONETOUCH VERIO) w/Device KIT Use to check blood sugar once daily and as needed for DM (dx. E11.65)  . glucose blood (ONETOUCH VERIO) test strip Use to check blood sugar once daily and as needed for DM (dx. E11.65)  . meloxicam (MOBIC) 15 MG tablet Take 1 tablet (15 mg total) by mouth daily as needed for pain. With a meal  . metFORMIN (GLUCOPHAGE) 1000 MG tablet Take 1 tablet (1,000 mg total) by mouth 2 (  two) times daily with a meal.  . omeprazole (PRILOSEC) 40 MG capsule Take 1 capsule (40 mg total) by mouth daily before breakfast.  . ONETOUCH DELICA LANCETS 30G MISC 1 each by Does not apply route daily as needed. Use to check blood sugar once daily and as needed for DM (dx. E11.65)  . potassium chloride (KLOR-CON 10) 10 MEQ tablet Take 1 tablet (10 mEq total) by mouth daily.  . spironolactone (ALDACTONE) 50 MG tablet TAKE 1 TABLET(50 MG) BY MOUTH DAILY  . tadalafil (CIALIS) 20 MG tablet   . thiamine 100 MG tablet Take 1 tablet (100 mg total) by mouth daily.  . [EXPIRED] methylPREDNISolone acetate (DEPO-MEDROL) injection 80 mg    No facility-administered encounter medications on file as of 07/28/2019.    

## 2019-07-28 ENCOUNTER — Encounter: Payer: Self-pay | Admitting: Family Medicine

## 2019-07-28 ENCOUNTER — Other Ambulatory Visit: Payer: Self-pay

## 2019-07-28 ENCOUNTER — Ambulatory Visit (INDEPENDENT_AMBULATORY_CARE_PROVIDER_SITE_OTHER): Payer: Managed Care, Other (non HMO) | Admitting: Family Medicine

## 2019-07-28 VITALS — BP 120/72 | HR 82 | Temp 98.2°F | Ht 66.0 in | Wt 187.5 lb

## 2019-07-28 DIAGNOSIS — M7502 Adhesive capsulitis of left shoulder: Secondary | ICD-10-CM

## 2019-07-28 MED ORDER — METHYLPREDNISOLONE ACETATE 40 MG/ML IJ SUSP
80.0000 mg | Freq: Once | INTRAMUSCULAR | Status: AC
  Administered 2019-07-28: 80 mg via INTRA_ARTICULAR

## 2019-08-25 IMAGING — DX DG HIP (WITH OR WITHOUT PELVIS) 2-3V LEFT
3 series · 3 of 3 positions shown · non-contrast
Comparison: None.

CLINICAL DATA: Left hip pain

EXAM:
DG HIP (WITH OR WITHOUT PELVIS) 2-3V LEFT

[pelvis ap]
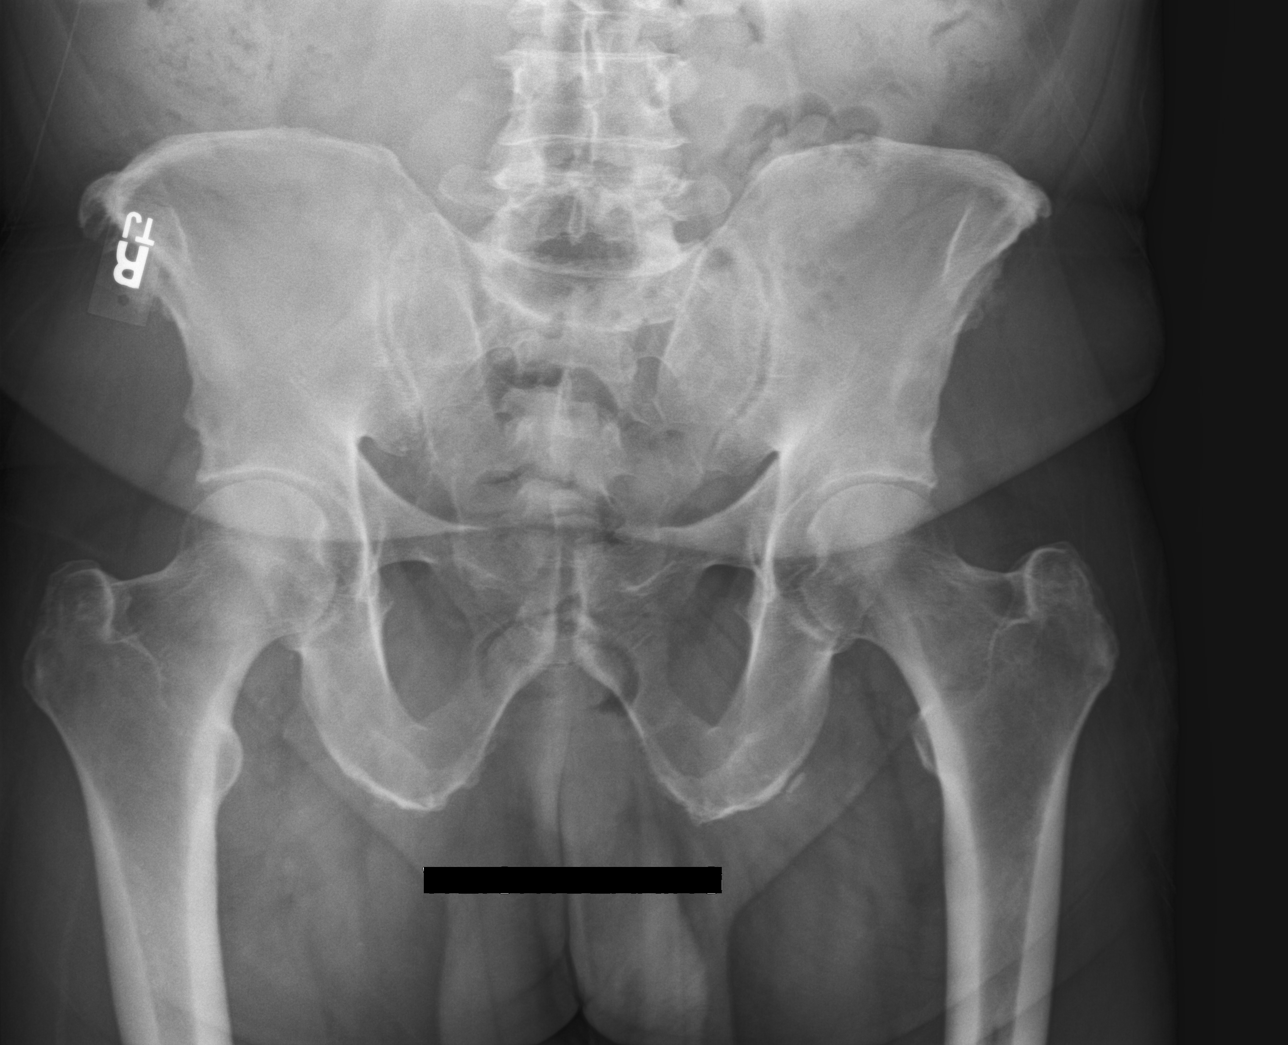

[hip ap]
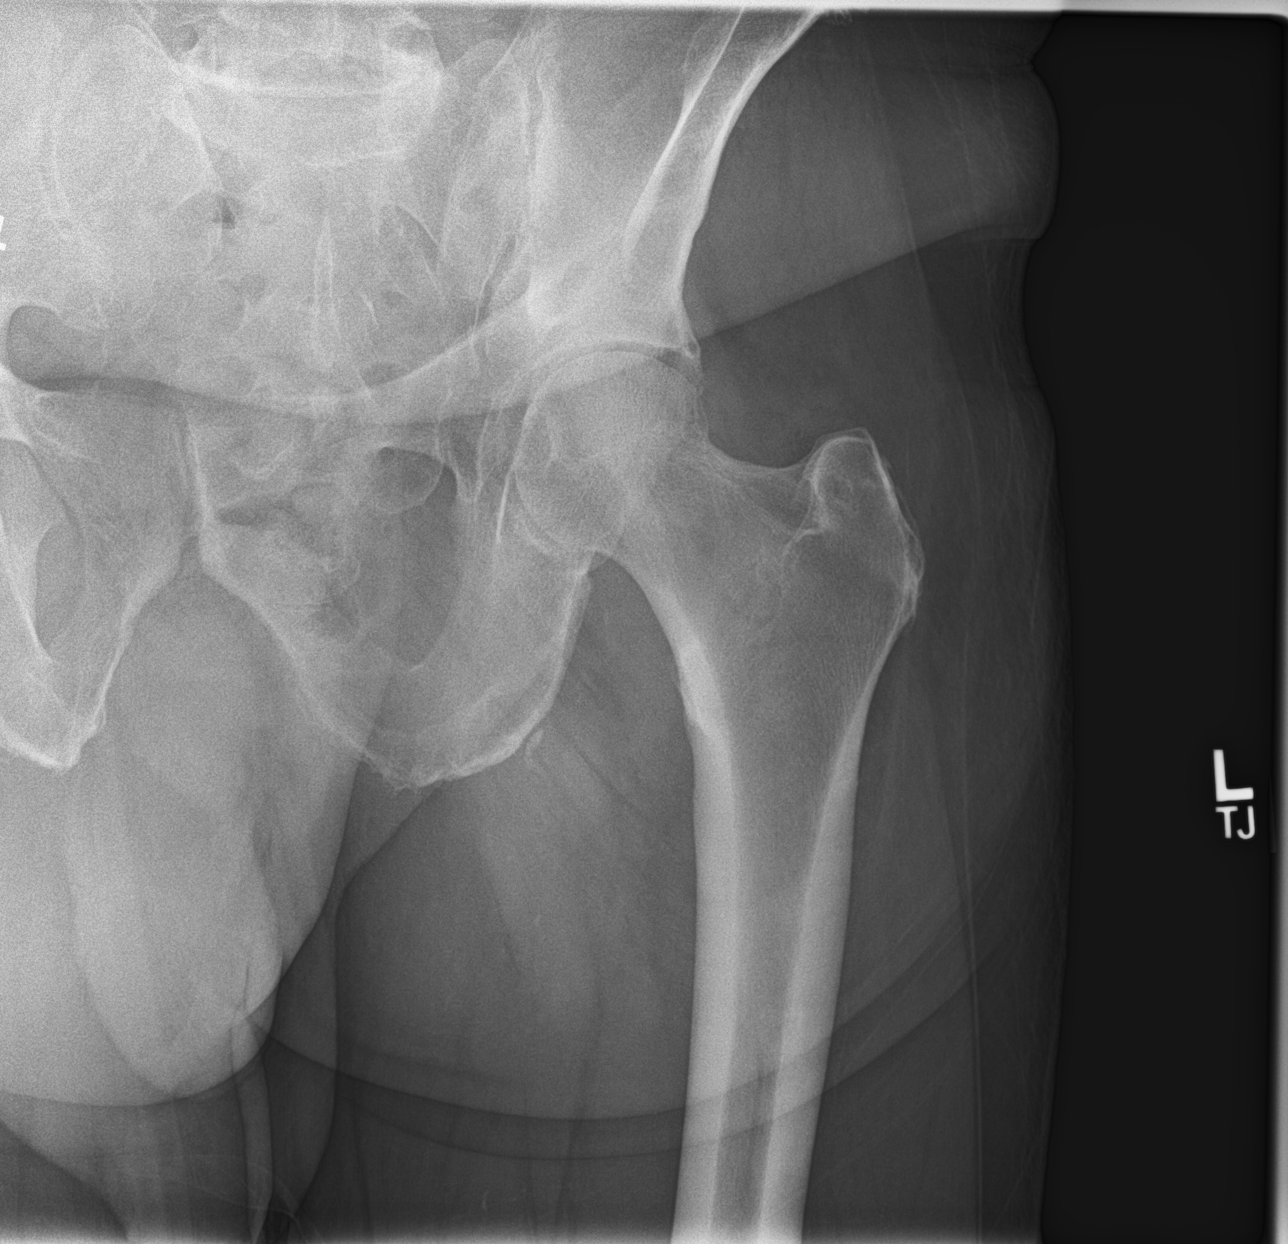

[hip lat]
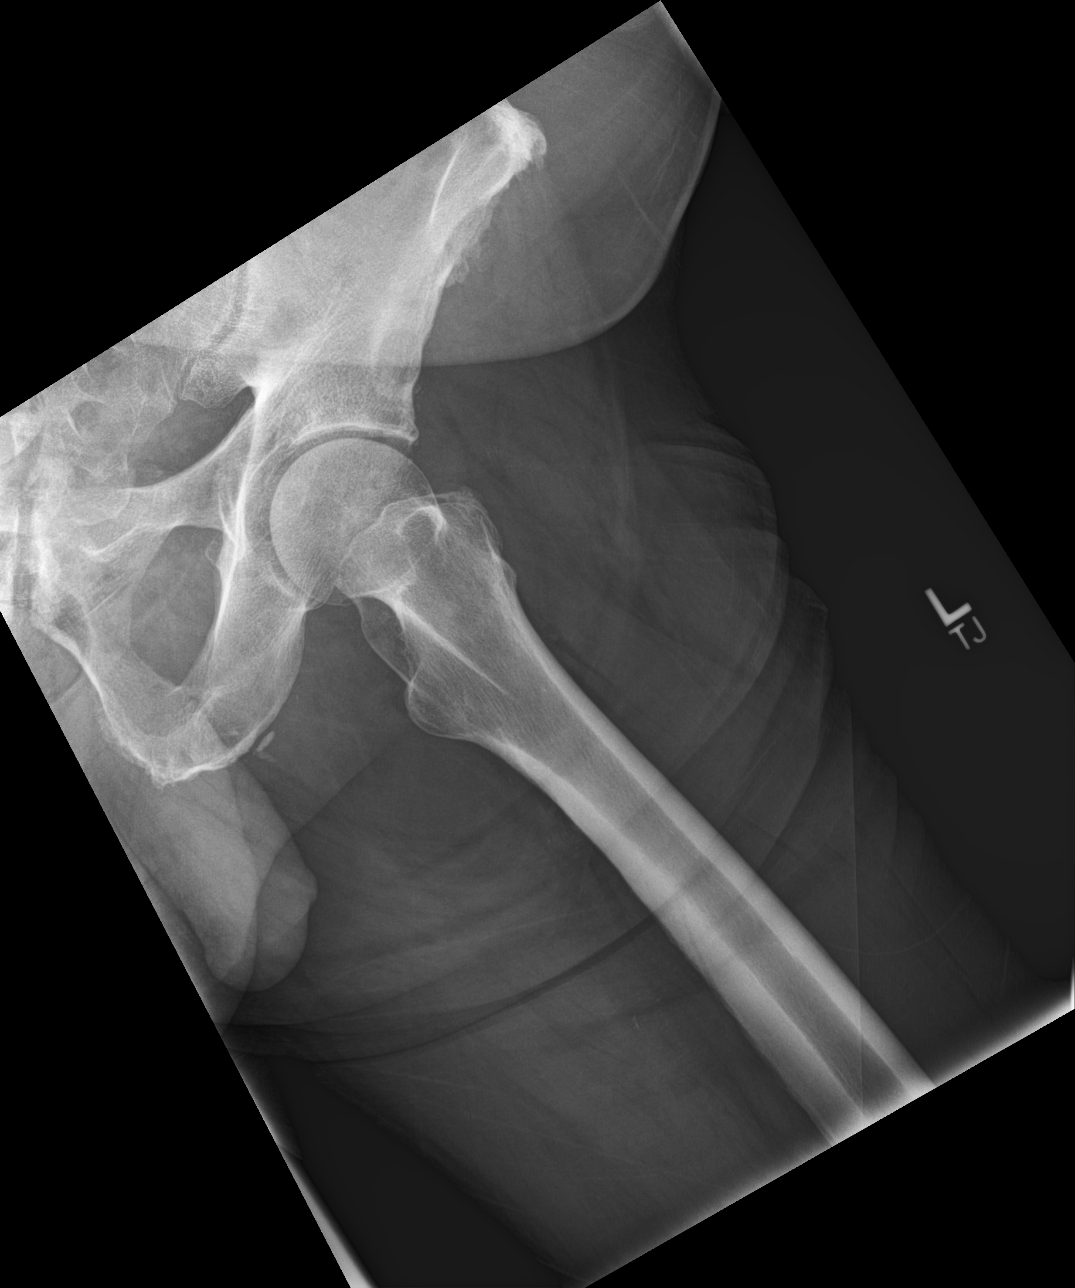

[3 of 3 positions shown; findings below may reference images not displayed]

FINDINGS: No acute fracture or malalignment. Mild left hip joint space
narrowing. No focal osseous lesion.
IMPRESSION: Mild degenerative changes without acute osseous abnormality.

## 2019-09-16 ENCOUNTER — Other Ambulatory Visit: Payer: Self-pay

## 2019-09-20 ENCOUNTER — Ambulatory Visit: Payer: Managed Care, Other (non HMO) | Admitting: Endocrinology

## 2019-10-13 ENCOUNTER — Encounter: Payer: Self-pay | Admitting: Endocrinology

## 2019-10-13 ENCOUNTER — Other Ambulatory Visit: Payer: Self-pay

## 2019-10-13 ENCOUNTER — Ambulatory Visit (INDEPENDENT_AMBULATORY_CARE_PROVIDER_SITE_OTHER): Payer: Managed Care, Other (non HMO) | Admitting: Endocrinology

## 2019-10-13 VITALS — BP 138/100 | HR 94 | Ht 66.0 in | Wt 182.0 lb

## 2019-10-13 DIAGNOSIS — E1165 Type 2 diabetes mellitus with hyperglycemia: Secondary | ICD-10-CM

## 2019-10-13 DIAGNOSIS — E119 Type 2 diabetes mellitus without complications: Secondary | ICD-10-CM

## 2019-10-13 LAB — POCT GLYCOSYLATED HEMOGLOBIN (HGB A1C): Hemoglobin A1C: 8.3 % — AB (ref 4.0–5.6)

## 2019-10-13 MED ORDER — RYBELSUS 3 MG PO TABS: 3.0000 mg | ORAL_TABLET | Freq: Every day | ORAL | 11 refills | Status: DC

## 2019-10-13 NOTE — Progress Notes (Signed)
Subjective:    Patient ID: Zachary Lewis, male    DOB: 1961-03-22, 59 y.o.   MRN: [id number]  HPI pt is referred by Dr Glori Bickers, for diabetes.  Pt states DM was dx'ed in 2010; he is unaware of any chronic complications; he has never been on insulin; pt says his diet and exercise are good; he has never had pancreatitis, pancreatic surgery, severe hypoglycemia or DKA.  He has not consumed EtOH x 1 year.  He takes metformin only.  He could not afford Januvia copay.   Past Medical History:  Diagnosis Date  . 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[place]) 08/04/2018  . Diabetes mellitus    Type II  . Hyperlipidemia   . Hypertension   . NSAID-induced gastric ulcer 12/13/2018   EGD 11/2018 H pylori neg Omeprazole started  . Obesity   . Personal history of colonic adenomas 03/09/2013  . Tinea pedis    Chronic    Past Surgical History:  Procedure Laterality Date  . COLONOSCOPY    . COLONOSCOPY W/ POLYPECTOMY  2014  . IR PARACENTESIS  07/02/2018  . IR TRANSCATHETER BX  07/17/2018  . IR VENOGRAM HEPATIC WO HEMODYNAMIC EVALUATION  07/17/2018  . myringectomy  06/2005  . POLYPECTOMY    . UMBILICAL HERNIA REPAIR  01/1997  . VASECTOMY  07/2002    Social History   Socioeconomic History  . Marital status: Married    Spouse name: Not on file  . Number of children: Not on file  . Years of education: Not on file  . Highest education level: Not on file  Occupational History  . Occupation: [employer]    Employer: [employer]  Tobacco Use  . Smoking status: Light Tobacco Smoker    Packs/day: 0.25  . Smokeless tobacco: Never Used  Substance and Sexual Activity  . 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 Drug use: No  . Sexual activity: Not on file  Other Topics Concern  . Not on file  Social History Narrative   Married, Agricultural consultant   Smoker, heavy drinking of Canadian mist  alcohol in past   No drug use   Social Determinants of Health   Financial Resource Strain:   . Difficulty of Paying Living Expenses:   Food Insecurity:   . Worried About Charity fundraiser in the Last Year:   . Arboriculturist in the Last Year:   Transportation Needs:   . Film/video editor (Medical):   Marland Kitchen Lack of Transportation (Non-Medical):   Physical Activity:   . Days of Exercise per Week:   . Minutes of Exercise per Session:   Stress:   . Feeling of Stress :   Social Connections:   . Frequency of Communication with Friends and Family:   . Frequency of Social Gatherings with Friends and Family:   . Attends Religious Services:   . Active Member of Clubs or Organizations:   . Attends Archivist Meetings:   Marland Kitchen Marital Status:   Intimate Partner Violence:   . Fear of Current or Ex-Partner:   . Emotionally Abused:   Marland Kitchen Physically Abused:   . Sexually Abused:     Current Outpatient Medications on File Prior to Visit  Medication Sig Dispense Refill  . metFORMIN (GLUCOPHAGE) 1000 MG tablet Take 1 tablet (1,000  mg total) by mouth 2 (two) times daily with a meal. 180 tablet 3  . potassium chloride (KLOR-CON 10) 10 MEQ tablet Take 1 tablet (10 mEq total) by mouth daily. 90 tablet 3  . spironolactone (ALDACTONE) 50 MG tablet TAKE 1 TABLET(50 MG) BY MOUTH DAILY 90 tablet 3  . tadalafil (CIALIS) 20 MG tablet Take 20 mg by mouth as needed.     . thiamine 100 MG tablet Take 1 tablet (100 mg total) by mouth daily. 30 tablet 0  . Blood Glucose Calibration (ONETOUCH VERIO) SOLN Use to calibrate meter (Patient not taking: Reported on 10/13/2019) 1 each 3  . Blood Glucose Monitoring Suppl (ONETOUCH VERIO) w/Device KIT Use to check blood sugar once daily and as needed for DM (dx. E11.65) (Patient not taking: Reported on 10/13/2019) 1 kit 0  . glucose blood (ONETOUCH VERIO) test strip Use to check blood sugar once daily and as needed for DM (dx. E11.65) (Patient not taking: Reported  on 10/13/2019) 100 each 3  . ONETOUCH DELICA LANCETS 67E MISC 1 each by Does not apply route daily as needed. Use to check blood sugar once daily and as needed for DM (dx. E11.65) (Patient not taking: Reported on 10/13/2019) 100 each 3   No current facility-administered medications on file prior to visit.    No Known Allergies  Family History  Problem Relation Age of Onset  . Hypertension Other   . Diabetes Neg Hx   . Heart disease Neg Hx   . Colon cancer Neg Hx   . Stomach cancer Neg Hx   . Pancreatic disease Neg Hx   . Colon polyps Neg Hx   . Esophageal cancer Neg Hx   . Rectal cancer Neg Hx     BP (!) 138/100 (BP Location: Left Arm, Cuff Size: Normal)   Pulse 94   Ht 5' 6"  (1.676 m)   Wt 182 lb (82.6 kg)   SpO2 97%   BMI 29.38 kg/m    Review of Systems denies weight loss, blurry vision, chest pain, sob, n/v, urinary frequency, memory loss, and depression.       Objective:   Physical Exam VS: see vs page GEN: no distress HEAD: head: no deformity eyes: no periorbital swelling, no proptosis.   external nose and ears are normal.   NECK: supple, thyroid is not enlarged CHEST WALL: no deformity LUNGS: clear to auscultation CV: reg rate and rhythm, no murmur MUSCULOSKELETAL: muscle bulk and strength are grossly normal.  no obvious joint swelling.  gait is normal and steady EXTEMITIES: no deformity.  no ulcer on the feet, but the skin there is dry and scaly.  Several heavy calluses.  feet are of normal color and temp.  no leg edema.  there is bilateral onychomycosis of the toenails.   PULSES: dorsalis pedis intact bilat.  no carotid bruit NEURO:  cn 2-12 grossly intact.   readily moves all 4's.  sensation is intact to touch on the feet.   SKIN:  Normal texture and temperature.  No rash or suspicious lesion is visible.   NODES:  None palpable at the neck PSYCH: alert, well-oriented.  Does not appear anxious nor depressed.     Lab Results  Component Value Date   HGBA1C  8.3 (A) 10/13/2019   MRI (2020): pancreas is normal  Lab Results  Component Value Date   CREATININE 0.74 05/24/2019   BUN 18 05/24/2019   NA 136 05/24/2019   K 4.3 05/24/2019   CL  102 05/24/2019   CO2 23 05/24/2019   Lab Results  Component Value Date   ALT 25 05/24/2019   AST 21 05/24/2019   ALKPHOS 158 (H) 05/24/2019   BILITOT 0.4 05/24/2019   I have reviewed outside records, and summarized: Pt was noted to have elevated A1c, and referred here.  HTN, dyslipidemia, and ALD we also addressed.       Assessment & Plan:  Type 2 DM, new to me.  He needs increased rx.   HTN: recheck next time.   Patient Instructions  Your blood pressure and heart rate are high today.  Please see your primary care provider soon, to have these rechecked good diet and exercise significantly improve the control of your diabetes.  please let me know if you wish to be referred to a dietician.  high blood sugar is very risky to your health.  you should see an eye doctor and dentist every year.  It is very important to get all recommended vaccinations.  Controlling your blood pressure and cholesterol drastically reduces the damage diabetes does to your body.  Those who smoke should quit.  Please discuss these with your doctor.  check your blood sugar once a day.  vary the time of day when you check, between before the 3 meals, and at bedtime.  also check if you have symptoms of your blood sugar being too high or too low.  please keep a record of the readings and bring it to your next appointment here (or you can bring the meter itself).  You can write it on any piece of paper.  please call us sooner if your blood sugar goes below 70, or if you have a lot of readings over 200.  Please continue the same meformin, and: I have sent a prescription to your pharmacy, to add "Rybelsus." Please come back for a follow-up appointment in 2 months.  Our goal is to get the blood sugar to the low-100's.  If this does not  happen, please call so we can increase the Rybelsus.

## 2019-10-13 NOTE — Patient Instructions (Addendum)
Your blood pressure and heart rate are high today.  Please see your primary care provider soon, to have these rechecked good diet and exercise significantly improve the control of your diabetes.  please let me know if you wish to be referred to a dietician.  high blood sugar is very risky to your health.  you should see an eye doctor and dentist every year.  It is very important to get all recommended vaccinations.  Controlling your blood pressure and cholesterol drastically reduces the damage diabetes does to your body.  Those who smoke should quit.  Please discuss these with your doctor.  check your blood sugar once a day.  vary the time of day when you check, between before the 3 meals, and at bedtime.  also check if you have symptoms of your blood sugar being too high or too low.  please keep a record of the readings and bring it to your next appointment here (or you can bring the meter itself).  You can write it on any piece of paper.  please call us sooner if your blood sugar goes below 70, or if you have a lot of readings over 200.  Please continue the same meformin, and: I have sent a prescription to your pharmacy, to add "Rybelsus." Please come back for a follow-up appointment in 2 months.  Our goal is to get the blood sugar to the low-100's.  If this does not happen, please call so we can increase the Rybelsus.

## 2019-12-16 ENCOUNTER — Ambulatory Visit: Payer: Managed Care, Other (non HMO) | Admitting: Endocrinology

## 2020-01-07 ENCOUNTER — Other Ambulatory Visit: Payer: Self-pay

## 2020-01-07 ENCOUNTER — Ambulatory Visit (INDEPENDENT_AMBULATORY_CARE_PROVIDER_SITE_OTHER): Payer: Managed Care, Other (non HMO) | Admitting: Endocrinology

## 2020-01-07 ENCOUNTER — Encounter: Payer: Self-pay | Admitting: Endocrinology

## 2020-01-07 VITALS — BP 120/86 | HR 78 | Ht 66.0 in | Wt 185.2 lb

## 2020-01-07 DIAGNOSIS — E1165 Type 2 diabetes mellitus with hyperglycemia: Secondary | ICD-10-CM | POA: Diagnosis not present

## 2020-01-07 DIAGNOSIS — N529 Male erectile dysfunction, unspecified: Secondary | ICD-10-CM | POA: Diagnosis not present

## 2020-01-07 LAB — POCT GLYCOSYLATED HEMOGLOBIN (HGB A1C): Hemoglobin A1C: 7.6 % — AB (ref 4.0–5.6)

## 2020-01-07 LAB — TSH: TSH: 3.84 u[IU]/mL (ref 0.35–4.50)

## 2020-01-07 MED ORDER — TADALAFIL 20 MG PO TABS: 20.0000 mg | ORAL_TABLET | ORAL | 11 refills | Status: DC | PRN

## 2020-01-07 MED ORDER — RYBELSUS 3 MG PO TABS: 3.0000 mg | ORAL_TABLET | Freq: Every day | ORAL | 11 refills | Status: DC

## 2020-01-07 NOTE — Patient Instructions (Addendum)
check your blood sugar once a day.  vary the time of day when you check, between before the 3 meals, and at bedtime.  also check if you have symptoms of your blood sugar being too high or too low.  please keep a record of the readings and bring it to your next appointment here (or you can bring the meter itself).  You can write it on any piece of paper.  please call us sooner if your blood sugar goes below 70, or if you have a lot of readings over 200.  Please continue the same meformin, and: I have sent a prescription to your pharmacy, to add "Rybelsus." Please come back for a follow-up appointment in 2 months.  Blood tests are requested for you today.  We'll let you know about the results.  Our goal is to get the blood sugar to the low-100's.  If this does not happen, please call so we can increase the Rybelsus.

## 2020-01-07 NOTE — Progress Notes (Signed)
Subjective:    Patient ID: Zachary Lewis, male    DOB: 12-Oct-1960, 59 y.o.   MRN: [id number]  HPI Pt returns for f/u of diabetes mellitus: DM type:  Dx'ed: 9201 Complications: none Therapy: metformin DKA: never Severe hypoglycemia: never Pancreatitis: never Pancreatic imaging: normal on 2020 MRI SDOH: He has not consumed EtOH since 2020 Other: he has never been on insulin. Interval history:  pt says cbg's are well-controlled. pt states he feels well in general. Past Medical History:  Diagnosis Date  . 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[place]) 08/04/2018  . Diabetes mellitus    Type II  . Hyperlipidemia   . Hypertension   . NSAID-induced gastric ulcer 12/13/2018   EGD 11/2018 H pylori neg Omeprazole started  . Obesity   . Personal history of colonic adenomas 03/09/2013  . Tinea pedis    Chronic    Past Surgical History:  Procedure Laterality Date  . COLONOSCOPY    . COLONOSCOPY W/ POLYPECTOMY  2014  . IR PARACENTESIS  07/02/2018  . IR TRANSCATHETER BX  07/17/2018  . IR VENOGRAM HEPATIC WO HEMODYNAMIC EVALUATION  07/17/2018  . myringectomy  06/2005  . POLYPECTOMY    . UMBILICAL HERNIA REPAIR  01/1997  . VASECTOMY  07/2002    Social History   Socioeconomic History  . Marital status: Married    Spouse name: Not on file  . Number of children: Not on file  . Years of education: Not on file  . Highest education level: Not on file  Occupational History  . Occupation: [employer]    Employer: [employer]  Tobacco Use  . Smoking status: Light Tobacco Smoker    Packs/day: 0.25  . Smokeless tobacco: Never Used  Vaping Use  . Vaping Use: Never used  Substance and Sexual Activity  . 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 Drug use: No  . Sexual activity: Not on file  Other Topics Concern  . Not on file  Social History Narrative   Married, Chief Operating Officer   Smoker, heavy drinking of Canadian mist alcohol in past   No drug use   Social Determinants of Health   Financial Resource Strain:   . Difficulty of Paying Living Expenses: Not on file  Food Insecurity:   . Worried About Charity fundraiser in the Last Year: Not on file  . Ran Out of Food in the Last Year: Not on file  Transportation Needs:   . Lack of Transportation (Medical): Not on file  . Lack of Transportation (Non-Medical): Not on file  Physical Activity:   . Days of Exercise per Week: Not on file  . Minutes of Exercise per Session: Not on file  Stress:   . Feeling of Stress : Not on file  Social Connections:   . Frequency of Communication with Friends and Family: Not on file  . Frequency of Social Gatherings with Friends and Family: Not on file  . Attends Religious Services: Not on file  . Active Member of Clubs or Organizations: Not on file  . Attends Archivist Meetings: Not on file  . Marital Status: Not on file  Intimate Partner Violence:   . Fear of Current or Ex-Partner: Not on file  . Emotionally Abused: Not on file  . Physically Abused: Not on file  .  Sexually Abused: Not on file    Current Outpatient Medications on File Prior to Visit  Medication Sig Dispense Refill  . Blood Glucose Calibration (ONETOUCH VERIO) SOLN Use to calibrate meter 1 each 3  . Blood Glucose Monitoring Suppl (ONETOUCH VERIO) w/Device KIT Use to check blood sugar once daily and as needed for DM (dx. E11.65) 1 kit 0  . glucose blood (ONETOUCH VERIO) test strip Use to check blood sugar once daily and as needed for DM (dx. E11.65) 100 each 3  . metFORMIN (GLUCOPHAGE) 1000 MG tablet Take 1 tablet (1,000 mg total) by mouth 2 (two) times daily with a meal. 180 tablet 3  . ONETOUCH DELICA LANCETS 31S MISC 1 each by Does not apply route daily as needed. Use to check blood sugar once daily and as needed for DM (dx. E11.65) 100 each 3  . potassium chloride (KLOR-CON 10) 10 MEQ  tablet Take 1 tablet (10 mEq total) by mouth daily. 90 tablet 3  . spironolactone (ALDACTONE) 50 MG tablet TAKE 1 TABLET(50 MG) BY MOUTH DAILY 90 tablet 3  . thiamine 100 MG tablet Take 1 tablet (100 mg total) by mouth daily. 30 tablet 0   No current facility-administered medications on file prior to visit.    No Known Allergies  Family History  Problem Relation Age of Onset  . Hypertension Other   . Diabetes Neg Hx   . Heart disease Neg Hx   . Colon cancer Neg Hx   . Stomach cancer Neg Hx   . Pancreatic disease Neg Hx   . Colon polyps Neg Hx   . Esophageal cancer Neg Hx   . Rectal cancer Neg Hx     BP 120/86   Pulse 78   Ht 5' 6"  (1.676 m)   Wt 185 lb 3.2 oz (84 kg)   SpO2 93%   BMI 29.89 kg/m    Review of Systems Pt reports ED sxs.      Objective:   Physical Exam VITAL SIGNS:  See vs page GENERAL: no distress Pulses: dorsalis pedis intact bilat.   MSK: no deformity of the feet CV: no leg edema Skin:  no ulcer on the feet.  normal color and temp on the feet. Neuro: sensation is intact to touch on the feet  Lab Results  Component Value Date   HGBA1C 7.6 (A) 01/07/2020   Lab Results  Component Value Date   CREATININE 0.74 05/24/2019   BUN 18 05/24/2019   NA 136 05/24/2019   K 4.3 05/24/2019   CL 102 05/24/2019   CO2 23 05/24/2019       Assessment & Plan:  Type 2 DM: uncontrolled.  ED, new to me, uncertain etiology  Patient Instructions  check your blood sugar once a day.  vary the time of day when you check, between before the 3 meals, and at bedtime.  also check if you have symptoms of your blood sugar being too high or too low.  please keep a record of the readings and bring it to your next appointment here (or you can bring the meter itself).  You can write it on any piece of paper.  please call us sooner if your blood sugar goes below 70, or if you have a lot of readings over 200.  Please continue the same meformin, and: I have sent a prescription  to your pharmacy, to add "Rybelsus." Please come back for a follow-up appointment in 2 months.  Blood tests are requested  for you today.  We'll let you know about the results.  Our goal is to get the blood sugar to the low-100's.  If this does not happen, please call so we can increase the Rybelsus.

## 2020-01-12 LAB — TESTOSTERONE,FREE AND TOTAL
Testosterone, Free: 8.3 pg/mL (ref 6.6–18.1)
Testosterone: 740 ng/dL (ref 264–916)

## 2020-03-10 ENCOUNTER — Ambulatory Visit: Payer: Managed Care, Other (non HMO) | Admitting: Endocrinology

## 2020-09-08 ENCOUNTER — Telehealth: Payer: Self-pay | Admitting: Family Medicine

## 2020-09-08 NOTE — Telephone Encounter (Signed)
Patient scheduled cpx on 11/07/20.

## 2020-09-08 NOTE — Telephone Encounter (Signed)
Please schedule CPE with fasting labs prior for Dr. Glori Bickers.

## 2020-10-19 ENCOUNTER — Encounter: Payer: Managed Care, Other (non HMO) | Admitting: Family Medicine

## 2020-10-26 ENCOUNTER — Telehealth: Payer: Self-pay | Admitting: Family Medicine

## 2020-10-26 MED ORDER — SPIRONOLACTONE 50 MG PO TABS: 50.0000 mg | ORAL_TABLET | Freq: Every day | ORAL | 0 refills | Status: DC

## 2020-10-26 NOTE — Addendum Note (Signed)
Addended by: Tammi Sou on: 10/26/2020 04:51 PM   Modules accepted: Orders

## 2020-10-26 NOTE — Telephone Encounter (Signed)
Rx sent 

## 2020-10-26 NOTE — Telephone Encounter (Signed)
Mr. Zachary Lewis called in and stated that he has 2 pills like of the Spironolactone.

## 2020-10-30 ENCOUNTER — Telehealth: Payer: Self-pay | Admitting: Family Medicine

## 2020-10-30 DIAGNOSIS — Z0000 Encounter for general adult medical examination without abnormal findings: Secondary | ICD-10-CM

## 2020-10-30 DIAGNOSIS — I10 Essential (primary) hypertension: Secondary | ICD-10-CM

## 2020-10-30 DIAGNOSIS — E1165 Type 2 diabetes mellitus with hyperglycemia: Secondary | ICD-10-CM

## 2020-10-30 DIAGNOSIS — Z125 Encounter for screening for malignant neoplasm of prostate: Secondary | ICD-10-CM

## 2020-10-30 NOTE — Telephone Encounter (Signed)
-----   Message from Cloyd Stagers, RT sent at 10/17/2020 12:15 PM EDT ----- Regarding: Lab Orders for Tuesday 6.14.2022 Please place lab orders for Tuesday 6.14.2022, office visit for physical on Tuesday 6.21.2022 Thank you, Dyke Maes RT(R)

## 2020-10-31 ENCOUNTER — Other Ambulatory Visit: Payer: Self-pay

## 2020-10-31 ENCOUNTER — Other Ambulatory Visit (INDEPENDENT_AMBULATORY_CARE_PROVIDER_SITE_OTHER): Payer: Managed Care, Other (non HMO)

## 2020-10-31 DIAGNOSIS — I10 Essential (primary) hypertension: Secondary | ICD-10-CM | POA: Diagnosis not present

## 2020-10-31 DIAGNOSIS — Z125 Encounter for screening for malignant neoplasm of prostate: Secondary | ICD-10-CM

## 2020-10-31 DIAGNOSIS — E1165 Type 2 diabetes mellitus with hyperglycemia: Secondary | ICD-10-CM | POA: Diagnosis not present

## 2020-10-31 LAB — HEMOGLOBIN A1C: Hgb A1c MFr Bld: 10.6 % — ABNORMAL HIGH (ref 4.6–6.5)

## 2020-10-31 LAB — PSA: PSA: 0.2 ng/mL (ref 0.10–4.00)

## 2020-10-31 LAB — COMPREHENSIVE METABOLIC PANEL
ALT: 31 U/L (ref 0–53)
AST: 19 U/L (ref 0–37)
Albumin: 4.6 g/dL (ref 3.5–5.2)
Alkaline Phosphatase: 130 U/L — ABNORMAL HIGH (ref 39–117)
BUN: 15 mg/dL (ref 6–23)
CO2: 25 mEq/L (ref 19–32)
Calcium: 9.9 mg/dL (ref 8.4–10.5)
Chloride: 99 mEq/L (ref 96–112)
Creatinine, Ser: 0.82 mg/dL (ref 0.40–1.50)
GFR: 95.03 mL/min (ref >60.00)
Glucose, Bld: 238 mg/dL — ABNORMAL HIGH (ref 70–99)
Potassium: 4.3 mEq/L (ref 3.5–5.1)
Sodium: 135 mEq/L (ref 135–145)
Total Bilirubin: 0.6 mg/dL (ref 0.2–1.2)
Total Protein: 7.4 g/dL (ref 6.0–8.3)

## 2020-10-31 LAB — LIPID PANEL
Cholesterol: 262 mg/dL — ABNORMAL HIGH (ref 0–200)
HDL: 48.5 mg/dL (ref >39.00)
LDL Cholesterol: 181 mg/dL — ABNORMAL HIGH (ref 0–99)
NonHDL: 213.9
Total CHOL/HDL Ratio: 5
Triglycerides: 164 mg/dL — ABNORMAL HIGH (ref 0.0–149.0)
VLDL: 32.8 mg/dL (ref 0.0–40.0)

## 2020-10-31 LAB — CBC WITH DIFFERENTIAL/PLATELET
Basophils Absolute: 0.1 10*3/uL (ref 0.0–0.1)
Basophils Relative: 1.1 % (ref 0.0–3.0)
Eosinophils Absolute: 0.7 10*3/uL (ref 0.0–0.7)
Eosinophils Relative: 11.4 % — ABNORMAL HIGH (ref 0.0–5.0)
HCT: 43.1 % (ref 39.0–52.0)
Hemoglobin: 14.8 g/dL (ref 13.0–17.0)
Lymphocytes Relative: 32.9 % (ref 12.0–46.0)
Lymphs Abs: 2.1 10*3/uL (ref 0.7–4.0)
MCHC: 34.4 g/dL (ref 30.0–36.0)
MCV: 99 fl (ref 78.0–100.0)
Monocytes Absolute: 0.8 10*3/uL (ref 0.1–1.0)
Monocytes Relative: 12 % (ref 3.0–12.0)
Neutro Abs: 2.8 10*3/uL (ref 1.4–7.7)
Neutrophils Relative %: 42.6 % — ABNORMAL LOW (ref 43.0–77.0)
Platelets: 234 10*3/uL (ref 150.0–400.0)
RBC: 4.35 Mil/uL (ref 4.22–5.81)
RDW: 13.4 % (ref 11.5–15.5)
WBC: 6.5 10*3/uL (ref 4.0–10.5)

## 2020-10-31 LAB — TSH: TSH: 4.84 u[IU]/mL — ABNORMAL HIGH (ref 0.35–4.50)

## 2020-11-07 ENCOUNTER — Other Ambulatory Visit: Payer: Self-pay

## 2020-11-07 ENCOUNTER — Ambulatory Visit (INDEPENDENT_AMBULATORY_CARE_PROVIDER_SITE_OTHER): Payer: Managed Care, Other (non HMO) | Admitting: Family Medicine

## 2020-11-07 ENCOUNTER — Encounter: Payer: Self-pay | Admitting: Family Medicine

## 2020-11-07 VITALS — BP 122/78 | HR 72 | Temp 97.4°F | Ht 66.0 in | Wt 192.4 lb

## 2020-11-07 DIAGNOSIS — Z6831 Body mass index (BMI) 31.0-31.9, adult: Secondary | ICD-10-CM

## 2020-11-07 DIAGNOSIS — Z125 Encounter for screening for malignant neoplasm of prostate: Secondary | ICD-10-CM

## 2020-11-07 DIAGNOSIS — E1169 Type 2 diabetes mellitus with other specified complication: Secondary | ICD-10-CM

## 2020-11-07 DIAGNOSIS — Z0001 Encounter for general adult medical examination with abnormal findings: Secondary | ICD-10-CM

## 2020-11-07 DIAGNOSIS — F17200 Nicotine dependence, unspecified, uncomplicated: Secondary | ICD-10-CM

## 2020-11-07 DIAGNOSIS — I10 Essential (primary) hypertension: Secondary | ICD-10-CM | POA: Diagnosis not present

## 2020-11-07 DIAGNOSIS — E1165 Type 2 diabetes mellitus with hyperglycemia: Secondary | ICD-10-CM | POA: Diagnosis not present

## 2020-11-07 DIAGNOSIS — Z91199 Patient's noncompliance with other medical treatment and regimen due to unspecified reason: Secondary | ICD-10-CM | POA: Insufficient documentation

## 2020-11-07 DIAGNOSIS — E6609 Other obesity due to excess calories: Secondary | ICD-10-CM

## 2020-11-07 DIAGNOSIS — E876 Hypokalemia: Secondary | ICD-10-CM

## 2020-11-07 DIAGNOSIS — K766 Portal hypertension: Secondary | ICD-10-CM

## 2020-11-07 DIAGNOSIS — L84 Corns and callosities: Secondary | ICD-10-CM

## 2020-11-07 DIAGNOSIS — Z8719 Personal history of other diseases of the digestive system: Secondary | ICD-10-CM

## 2020-11-07 DIAGNOSIS — K709 Alcoholic liver disease, unspecified: Secondary | ICD-10-CM

## 2020-11-07 DIAGNOSIS — Z9119 Patient's noncompliance with other medical treatment and regimen: Secondary | ICD-10-CM

## 2020-11-07 DIAGNOSIS — E785 Hyperlipidemia, unspecified: Secondary | ICD-10-CM

## 2020-11-07 DIAGNOSIS — R7989 Other specified abnormal findings of blood chemistry: Secondary | ICD-10-CM | POA: Insufficient documentation

## 2020-11-07 NOTE — Assessment & Plan Note (Signed)
Explained to pt the importance of regular pcp and specialist f/u for his medical problems  No endocrinology f/u since august and did not take med px (does not remember this yet has no h/o dementia and denies etoh)  Urged to make appt for eye exam  Ref done for endocrinology  Enc f/u with GI/hepatology when due  Stressed great imp of smoking cessation  Reviewed medications and imp of each

## 2020-11-07 NOTE — Assessment & Plan Note (Signed)
Off K and level is ok

## 2020-11-07 NOTE — Assessment & Plan Note (Signed)
Lab Results  Component Value Date   TSH 4.84 (H) 10/31/2020    Will re check this in 1 mo with FT4 No clinical changes

## 2020-11-07 NOTE — Patient Instructions (Addendum)
Please call us with your covid shot dates  Get your booster when you can   If you are interested in the shingles vaccine series (Shingrix), call your insurance or pharmacy to check on coverage and location it must be given.  If affordable - you can schedule it here or at your pharmacy depending on coverage   Get a flu shot every fall   Please avoid alcohol entirely   I want to refer you to cardiology to discuss risk factors for vascular disease , and treatment options for cholesterol   Don't forget to schedule eye doctor appt for yearly diabetic eye exam    Get your glucose meter checked at the pharmacy to make sure it is accurate   Please schedule lab for thyroid in 1 month

## 2020-11-07 NOTE — Assessment & Plan Note (Signed)
No longer going to podiatry but feet look better today

## 2020-11-07 NOTE — Assessment & Plan Note (Addendum)
Lab Results  Component Value Date   HGBA1C 10.6 (H) 10/31/2020  pt is lost to f/u with endocrinology since august 2021 Interestingly he did not remember any of this and never started the semaglutide that he was px  Continues metformin  No ace or statin No flu shot  Stressed imp of follow up  Ref made to endocrinology  Also pt agreed to make his own oph appt  I urged him to quit smoking Also to get glucometer checked at pharmacy since his levels at home do not seem to be right  This is all concerning to me  At least 30 minutes were devoted to patient care in this encounter. This can potentially include time spent reviewing the patient's file/history, interviewing and examining the patient, counseling/reviewing plan with patient, ordering referrals, ordering tests, reviewing relevant laboratory or imaging data and documenting the encounter.

## 2020-11-07 NOTE — Progress Notes (Signed)
Subjective:    Patient ID: Zachary Lewis, male    DOB: November 04, 1960, 60 y.o.   MRN: [id number]  This visit occurred during the SARS-CoV-2 public health emergency.  Safety protocols were in place, including screening questions prior to the visit, additional usage of staff PPE, and extensive cleaning of exam room while observing appropriate contact time as indicated for disinfecting solutions.   HPI Here for health maintenance exam and to review chronic medical problems    Wt Readings from Last 3 Encounters:  11/07/20 192 lb 6.4 oz (87.3 kg)  01/07/20 185 lb 3.2 oz (84 kg)  10/13/19 182 lb (82.6 kg)   31.05 kg/m  Doing well  Exercising and eating better  No red meat  Avoids sugars   Covid status - is immunized -no booster yet  Zoster status -interested in shingrix Flu vaccine - did not get  Tdap 6/20   Colonoscopy 7/20 with 3 y recall   Prostate health Lab Results  Component Value Date   PSA 0.20 10/31/2020   PSA 0.20 10/29/2018   PSA 0.40 12/08/2012   No trouble voiding Nocturia one time    HTN bp is stable today  No cp or palpitations or headaches or edema  No side effects to medicines  BP Readings from Last 3 Encounters:  11/07/20 122/78  01/07/20 120/86  10/13/19 (!) 138/100      Taking aldactone 50 mg daily   Lab Results  Component Value Date   CREATININE 0.82 10/31/2020   BUN 15 10/31/2020   NA 135 10/31/2020   K 4.3 10/31/2020   CL 99 10/31/2020   CO2 25 10/31/2020     DM2 Under endocrinology care  Semalutide 3 mg daily - no clue why he is not taking it   Metformin 1000 mg bid   Lab Results  Component Value Date   HGBA1C 10.6 (H) 10/31/2020  Last f/u was aug of 21 Eating well   Pt thinks his sugar levels were good at home  Last checked 2 wk ago  Thinks he avg 120s in am and 140 2 h after a meal     Hyperlipidemia  Lab Results  Component Value Date   CHOL 262 (H) 10/31/2020   CHOL 252 (H) 05/24/2019   CHOL 224 (H)  10/29/2018   Lab Results  Component Value Date   HDL 48.50 10/31/2020   HDL 64.50 05/24/2019   HDL 50.20 10/29/2018   Lab Results  Component Value Date   LDLCALC 181 (H) 10/31/2020   LDLCALC 163 (H) 05/24/2019   LDLCALC 154 (H) 10/29/2018   Lab Results  Component Value Date   TRIG 164.0 (H) 10/31/2020   TRIG 123.0 05/24/2019   TRIG 102.0 10/29/2018   Lab Results  Component Value Date   CHOLHDL 5 10/31/2020   CHOLHDL 4 05/24/2019   CHOLHDL 4 10/29/2018   Lab Results  Component Value Date   LDLDIRECT 176.9 12/08/2012   LDLDIRECT 195.5 02/17/2012   LDLDIRECT 178.4 10/30/2011    Cannot take statin due to liver dz  The 10-year ASCVD risk score Mikey Bussing DC Jr., et al., 2013) is: 38.1%   Values used to calculate the score:     Age: 73 years     Sex: Male     Is Non-Hispanic African American: Yes     Diabetic: Yes     Tobacco smoker: Yes     Systolic Blood Pressure: 030 mmHg     Is BP treated: Yes  HDL Cholesterol: 48.5 mg/dL     Total Cholesterol: 262 mg/dL  Liver fxn Lab Results  Component Value Date   ALT 31 10/31/2020   AST 19 10/31/2020   ALKPHOS 130 (H) 10/31/2020   BILITOT 0.6 10/31/2020    Has had some alcohol  A drink in January- 1 glass of wine     TSH is a little elevated Lab Results  Component Value Date   TSH 4.84 (H) 10/31/2020     Smoking 3 cig per day  Bought some patches   Patient Active Problem List   Diagnosis Date Noted   Elevated TSH 11/07/2020   Noncompliance 11/07/2020   Erectile dysfunction 01/07/2020   Portal hypertension ([place]) 06/24/2019   Low back pain 12/29/2018   NSAID-induced gastric ulcer 12/13/2018   Pain due to onychomycosis of toenails of both feet 12/01/2018   Pre-ulcerative calluses 12/01/2018   Porokeratosis 12/01/2018   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/2018   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[place]) 08/04/2018   History of alcoholic hepatitis    Alcoholic liver disease  ([place])    Smoking 01/04/2014   Hx of adenomatous colonic polyps 03/09/2013   Prostate cancer screening 12/07/2012   Encounter for general adult medical examination with abnormal findings 08/23/2010   Obesity    HYPERTENSION, BENIGN ESSENTIAL 01/05/2010   BACK PAIN, LUMBAR, WITH RADICULOPATHY 09/13/2008   TINEA PEDIS 07/16/2007   Diabetes type 2, uncontrolled ([place]) 08/13/2006   Hyperlipidemia associated with type 2 diabetes mellitus ([place]) 08/13/2006   ALLERGY 08/13/2006   Fatty liver 08/13/2006   Past Medical History:  Diagnosis Date   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[place]) 08/04/2018   Diabetes mellitus    Type II   Hyperlipidemia    Hypertension    NSAID-induced gastric ulcer 12/13/2018   EGD 11/2018 H pylori neg Omeprazole started   Obesity    Personal history of colonic adenomas 03/09/2013   Tinea pedis    Chronic   Past Surgical History:  Procedure Laterality Date   COLONOSCOPY     COLONOSCOPY W/ POLYPECTOMY  2014   IR PARACENTESIS  07/02/2018   IR TRANSCATHETER BX  07/17/2018   IR VENOGRAM HEPATIC WO HEMODYNAMIC EVALUATION  07/17/2018   myringectomy  06/2005   POLYPECTOMY     UMBILICAL HERNIA REPAIR  01/1997   VASECTOMY  07/2002   Social History   Tobacco Use   Smoking status: Light Smoker    Packs/day: 0.25    Pack years: 0.00    Types: Cigarettes   Smokeless tobacco: Never  Vaping Use   Vaping Use: Never used  Substance Use Topics   Alcohol use: Not Currently    Comment: Heavy use of [place] mist daily most of the time/ stopped drinking 05-2018    Drug use: No   Family History  Problem Relation Age of Onset   Hypertension Other    Diabetes Neg Hx    Heart disease Neg Hx    Colon cancer Neg Hx    Stomach cancer Neg Hx    Pancreatic disease Neg Hx    Colon polyps Neg Hx    Esophageal cancer Neg Hx    Rectal cancer Neg Hx    No Known Allergies Current Outpatient Medications on File Prior to Visit  Medication Sig Dispense Refill    Blood Glucose Calibration (ONETOUCH VERIO) SOLN Use to calibrate meter 1 each 3   Blood Glucose Monitoring  Suppl (ONETOUCH VERIO) w/Device KIT Use to check blood sugar once daily and as needed for DM (dx. E11.65) 1 kit 0   glucose blood (ONETOUCH VERIO) test strip Use to check blood sugar once daily and as needed for DM (dx. E11.65) 100 each 3   metFORMIN (GLUCOPHAGE) 1000 MG tablet Take 1 tablet (1,000 mg total) by mouth 2 (two) times daily with a meal. 180 tablet 3   ONETOUCH DELICA LANCETS 46F MISC 1 each by Does not apply route daily as needed. Use to check blood sugar once daily and as needed for DM (dx. E11.65) 100 each 3   Semaglutide (RYBELSUS) 3 MG TABS Take 3 mg by mouth daily. 30 tablet 11   spironolactone (ALDACTONE) 50 MG tablet Take 1 tablet (50 mg total) by mouth daily. 90 tablet 0   tadalafil (CIALIS) 20 MG tablet Take 1 tablet (20 mg total) by mouth as needed. 10 tablet 11   thiamine 100 MG tablet Take 1 tablet (100 mg total) by mouth daily. 30 tablet 0   No current facility-administered medications on file prior to visit.    Review of Systems  Constitutional:  Negative for activity change, appetite change, fatigue, fever and unexpected weight change.  HENT:  Negative for congestion, rhinorrhea, sore throat and trouble swallowing.   Eyes:  Negative for pain, redness, itching and visual disturbance.  Respiratory:  Negative for cough, chest tightness, shortness of breath and wheezing.   Cardiovascular:  Negative for chest pain and palpitations.  Gastrointestinal:  Negative for abdominal pain, blood in stool, constipation, diarrhea and nausea.  Endocrine: Negative for cold intolerance, heat intolerance, polydipsia and polyuria.  Genitourinary:  Negative for difficulty urinating, dysuria, frequency and urgency.  Musculoskeletal:  Negative for arthralgias, joint swelling and myalgias.  Skin:  Negative for pallor and rash.  Neurological:  Negative for dizziness, tremors, weakness,  numbness and headaches.  Hematological:  Negative for adenopathy. Does not bruise/bleed easily.  Psychiatric/Behavioral:  Negative for decreased concentration and dysphoric mood. The patient is not nervous/anxious.       Objective:   Physical Exam Constitutional:      General: He is not in acute distress.    Appearance: Normal appearance. He is well-developed. He is obese. He is not ill-appearing or diaphoretic.  HENT:     Head: Normocephalic and atraumatic.     Right Ear: Tympanic membrane, ear canal and external ear normal.     Left Ear: Tympanic membrane, ear canal and external ear normal.     Nose: Nose normal. No congestion.     Mouth/Throat:     Mouth: Mucous membranes are moist.     Pharynx: Oropharynx is clear. No posterior oropharyngeal erythema.  Eyes:     General: No scleral icterus.       Right eye: No discharge.        Left eye: No discharge.     Conjunctiva/sclera: Conjunctivae normal.     Pupils: Pupils are equal, round, and reactive to light.  Neck:     Thyroid: No thyromegaly.     Vascular: No carotid bruit or JVD.  Cardiovascular:     Rate and Rhythm: Normal rate and regular rhythm.     Pulses: Normal pulses.     Heart sounds: Normal heart sounds.    No gallop.  Pulmonary:     Effort: Pulmonary effort is normal. No respiratory distress.     Breath sounds: Normal breath sounds. No wheezing or rales.  Comments: Good air exch Chest:     Chest wall: No tenderness.  Abdominal:     General: Bowel sounds are normal. There is no distension or abdominal bruit.     Palpations: Abdomen is soft. There is no mass.     Tenderness: There is no abdominal tenderness.     Hernia: No hernia is present.  Musculoskeletal:        General: No tenderness.     Cervical back: Normal range of motion and neck supple. No rigidity. No muscular tenderness.     Right lower leg: No edema.     Left lower leg: No edema.  Lymphadenopathy:     Cervical: No cervical adenopathy.   Skin:    General: Skin is warm and dry.     Coloration: Skin is not pale.     Findings: No erythema or rash.     Comments: Small comedone on chest  Neurological:     Mental Status: He is alert.     Cranial Nerves: No cranial nerve deficit.     Sensory: No sensory deficit.     Motor: No abnormal muscle tone.     Coordination: Coordination normal.     Gait: Gait normal.     Deep Tendon Reflexes: Reflexes are normal and symmetric. Reflexes normal.  Psychiatric:        Attention and Perception: Attention normal.        Mood and Affect: Mood normal.        Cognition and Memory: Cognition normal.     Comments: Pt seemed attentive and with nl cognition   States he forgot about endocrinology f/u which is puzzling but no signs of dementia on interview  Does not appear to be confused             Assessment & Plan:   Problem List Items Addressed This Visit       Cardiovascular and Mediastinum   HYPERTENSION, BENIGN ESSENTIAL    bp in fair control at this time  BP Readings from Last 1 Encounters:  11/07/20 122/78  No changes needed Most recent labs reviewed  Disc lifstyle change with low sodium diet and exercise  Plan to continue aldactone 50 mg daily  Per pt no longer taking K and since K level is nl we removed it from med list       Relevant Orders   Ambulatory referral to Cardiology   Portal hypertension ([place])    bp is well controlled and no symptoms          Digestive   Alcoholic liver disease ([place])    Per pt -has had one glass of wine in January and that is the only drink since last visit here Denies any issues or problems with etoh  Continues to avoid hepatotoxic meds         Endocrine   Diabetes type 2, uncontrolled ([place])    Lab Results  Component Value Date   HGBA1C 10.6 (H) 10/31/2020  pt is lost to f/u with endocrinology since august 2021 Interestingly he did not remember any of this and never started the semaglutide that he was px  Continues  metformin  No ace or statin No flu shot  Stressed imp of follow up  Ref made to endocrinology  Also pt agreed to make his own oph appt  I urged him to quit smoking Also to get glucometer checked at pharmacy since his levels at home do not seem to be right  This is all concerning to me  At least 30 minutes were devoted to patient care in this encounter. This can potentially include time spent reviewing the patient's file/history, interviewing and examining the patient, counseling/reviewing plan with patient, ordering referrals, ordering tests, reviewing relevant laboratory or imaging data and documenting the encounter.          Relevant Orders   Ambulatory referral to Endocrinology   Hyperlipidemia associated with type 2 diabetes mellitus ([place])    Disc goals for lipids and reasons to control them Rev last labs with pt Rev low sat fat diet in detail Pt cannot have statins due to etoh liver dz  LDL is up to 181 despite per pt being compliant with diet  Concerning  In comb with smoking and uncontrolled dm his future risk of vasc dz is very high  Ref done to cardiology to address this and disc other opt for chol tx       Relevant Orders   Ambulatory referral to Cardiology     Musculoskeletal and Integument   Pre-ulcerative calluses    No longer going to podiatry but feet look better today         Other   Obesity    Discussed how this problem influences overall health and the risks it imposes  Reviewed plan for weight loss with lower calorie diet (via better food choices and also portion control or program like weight watchers) and exercise building up to or more than 30 minutes 5 days per week including some aerobic activity  Per pt still exercising and commended for that          Encounter for general adult medical examination with abnormal findings - Primary    Reviewed health habits including diet and exercise and skin cancer prevention Reviewed appropriate  screening tests for age  Also reviewed health mt list, fam hx and immunization status , as well as social and family history   See HPI Labs reviewed  Noted elevated TSH and will make plan Noted out of control DM and ref to endo done Interested in shingrix-enc to check on cov Enc yearly flu shot  Colonoscopy utd  May be due for gi f/u  Ref to cardiology to disc risk factors for vasc dz Enc smoking cessation-he bought patches Stable psa  Enc him to call with his covid vaccine dates and also get the booster         Prostate cancer screening    Lab Results  Component Value Date   PSA 0.20 10/31/2020   PSA 0.20 10/29/2018   PSA 0.40 12/08/2012   No change in voiding  Nocturia occ times 1       Smoking    Disc in detail risks of smoking and possible outcomes including copd, vascular/ heart disease, cancer , respiratory and sinus infections  Pt voices understanding Has 3 cig per day currently  Per pt bought patches but not ready to quit       History of alcoholic hepatitis    LFTs are stable Per pt one drink in jan -otherwise none  Exam is unchanged In the past went to [facility] Pt is uncertain if he is supposed to f/u with them or GI here  No new symptoms        Elevated TSH    Lab Results  Component Value Date   TSH 4.84 (H) 10/31/2020    Will re check this in 1 mo with FT4 No  clinical changes       Relevant Orders   TSH   T4, free   Noncompliance    Explained to pt the importance of regular pcp and specialist f/u for his medical problems  No endocrinology f/u since august and did not take med px (does not remember this yet has no h/o dementia and denies etoh)  Urged to make appt for eye exam  Ref done for endocrinology  Enc f/u with GI/hepatology when due  Stressed great imp of smoking cessation  Reviewed medications and imp of each       RESOLVED: Hypokalemia    Off K and level is ok

## 2020-11-07 NOTE — Assessment & Plan Note (Addendum)
LFTs are stable Per pt one drink in jan -otherwise none  Exam is unchanged In the past went to [facility] Pt is uncertain if he is supposed to f/u with them or GI here  No new symptoms

## 2020-11-07 NOTE — Assessment & Plan Note (Addendum)
Reviewed health habits including diet and exercise and skin cancer prevention Reviewed appropriate screening tests for age  Also reviewed health mt list, fam hx and immunization status , as well as social and family history   See HPI Labs reviewed  Noted elevated TSH and will make plan Noted out of control DM and ref to endo done Interested in shingrix-enc to check on cov Enc yearly flu shot  Colonoscopy utd  May be due for gi f/u  Ref to cardiology to disc risk factors for vasc dz Enc smoking cessation-he bought patches Stable psa  Enc him to call with his covid vaccine dates and also get the booster

## 2020-11-07 NOTE — Assessment & Plan Note (Signed)
Disc in detail risks of smoking and possible outcomes including copd, vascular/ heart disease, cancer , respiratory and sinus infections  Pt voices understanding Has 3 cig per day currently  Per pt bought patches but not ready to quit

## 2020-11-07 NOTE — Assessment & Plan Note (Signed)
Lab Results  Component Value Date   PSA 0.20 10/31/2020   PSA 0.20 10/29/2018   PSA 0.40 12/08/2012    No change in voiding  Nocturia occ times 1

## 2020-11-07 NOTE — Assessment & Plan Note (Signed)
bp in fair control at this time  BP Readings from Last 1 Encounters:  11/07/20 122/78   No changes needed Most recent labs reviewed  Disc lifstyle change with low sodium diet and exercise  Plan to continue aldactone 50 mg daily  Per pt no longer taking K and since K level is nl we removed it from med list

## 2020-11-07 NOTE — Assessment & Plan Note (Signed)
bp is well controlled and no symptoms

## 2020-11-07 NOTE — Assessment & Plan Note (Signed)
Per pt -has had one glass of wine in January and that is the only drink since last visit here Denies any issues or problems with etoh  Continues to avoid hepatotoxic meds

## 2020-11-07 NOTE — Assessment & Plan Note (Signed)
Disc goals for lipids and reasons to control them Rev last labs with pt Rev low sat fat diet in detail Pt cannot have statins due to etoh liver dz  LDL is up to 181 despite per pt being compliant with diet  Concerning  In comb with smoking and uncontrolled dm his future risk of vasc dz is very high  Ref done to cardiology to address this and disc other opt for chol tx

## 2020-11-07 NOTE — Assessment & Plan Note (Addendum)
Discussed how this problem influences overall health and the risks it imposes  Reviewed plan for weight loss with lower calorie diet (via better food choices and also portion control or program like weight watchers) and exercise building up to or more than 30 minutes 5 days per week including some aerobic activity  Per pt still exercising and commended for that

## 2020-11-22 ENCOUNTER — Encounter: Payer: Self-pay | Admitting: Family Medicine

## 2020-12-05 ENCOUNTER — Telehealth: Payer: Self-pay | Admitting: *Deleted

## 2020-12-05 NOTE — Telephone Encounter (Signed)
Patient called stating that he went today and had a rapid covid test done that was positive. Patient stated that he started yesterday with a scratchy throat and headache. Patient stated that he has a cough and sniffles now. Patient denies a fever or SOB. Patient stated that he may need medication because of his medical history. Patient scheduled for a virtual visit with Dr. Lorelei Pont tomorrow 12/06/20 at 10:40 am. Patient was advised to rest and drink lots of fluids. Patient was given ER precautions and he verbalized understanding. Patient stated that he has a lab appointment scheduled Friday 12/08/20 for a thyroid check. Lab appointment cancelled and patient stated that he will call back and reschedule the lab appointment when he is out of quarantine.

## 2020-12-05 NOTE — Telephone Encounter (Signed)
Aware, will watch for correspondence Thanks

## 2020-12-06 ENCOUNTER — Encounter: Payer: Self-pay | Admitting: Family Medicine

## 2020-12-06 ENCOUNTER — Telehealth (INDEPENDENT_AMBULATORY_CARE_PROVIDER_SITE_OTHER): Payer: Managed Care, Other (non HMO) | Admitting: Family Medicine

## 2020-12-06 VITALS — BP 110/74 | Temp 95.5°F

## 2020-12-06 DIAGNOSIS — U071 COVID-19: Secondary | ICD-10-CM

## 2020-12-06 MED ORDER — MOLNUPIRAVIR EUA 200MG CAPSULE: 4.0000 | ORAL_CAPSULE | Freq: Two times a day (BID) | ORAL | 0 refills | Status: AC

## 2020-12-06 NOTE — Progress Notes (Signed)
      Zachary Lewis T. Akansha Wyche, MD [facility] [place] [facility] [place], [address] Phone: [phone]  FAX: [place] - 60 y.o. male  MRN [id number]  Date of Birth: [birth year]  Visit Date: 12/06/2020  PCP: Abner Greenspan, MD  Referred by: Tower, Wynelle Fanny, MD  Virtual Visit via Video Note:  I connected with  ALOYSUIS RIBAUDO on 12/06/2020 10:40 AM EDT by a video enabled telemedicine application and verified that I am speaking with the correct person using two identifiers.   Location patient: home computer, tablet, or smartphone Location provider: work or home office Consent: Verbal consent directly obtained from [employer]. Persons participating in the virtual visit: patient, provider  I discussed the limitations of evaluation and management by telemedicine and the availability of in person appointments. The patient expressed understanding and agreed to proceed.  Chief Complaint  Patient presents with   Covid Positive    Tested Yesterday at [facility]   Generalized Body Aches   Nasal Congestion   Cough    Mild    History of Present Illness:  Covid-19.  He does have confirmed COVID-19. Started to feel bad on Monday. Took some Alka Selzer plus.  This did seem to make him feel better. Tuesday +. Some runny nose and headaches.  He also does have some body aches.  He denies any loss of taste or smell, nauseousness, vomiting, diarrhea.  He has minimal headache.  No sore throat.  His wife also does have COVID-19.  He does take Cialis.  Lab Results  Component Value Date   HGBA1C 10.6 (H) 10/31/2020      Review of Systems as above: See pertinent positives and pertinent negatives per HPI No acute distress verbally   Observations/Objective/Exam:  An attempt was made to discern vital signs over the phone and per patient if applicable and possible.    General:    Alert, Oriented, appears well and in no acute distress  Pulmonary:     On inspection no signs of respiratory distress.  Psych / Neurological:     Pleasant and cooperative.  Assessment and Plan:    ICD-10-CM   1. COVID-19  U07.1      Confirmed COVID-19 with uncontrolled A1c at age 43.  With a history of some cirrhosis based on chart review, I will give the patient Molnupiravir.  Reviewed all in red flags.  Questions answered.  I discussed the assessment and treatment plan with the patient. The patient was provided an opportunity to ask questions and all were answered. The patient agreed with the plan and demonstrated an understanding of the instructions.   The patient was advised to call back or seek an in-person evaluation if the symptoms worsen or if the condition fails to improve as anticipated.  Follow-up: prn unless noted otherwise below No follow-ups on file.  Meds ordered this encounter  Medications   molnupiravir EUA 200 mg CAPS    Sig: Take 4 capsules (800 mg total) by mouth 2 (two) times daily for 5 days.    Dispense:  40 capsule    Refill:  0   No orders of the defined types were placed in this encounter.   Signed,  Maud Deed. Maleah Rabago, MD

## 2020-12-08 ENCOUNTER — Other Ambulatory Visit: Payer: Managed Care, Other (non HMO)

## 2021-01-12 ENCOUNTER — Other Ambulatory Visit: Payer: Self-pay | Admitting: Family Medicine

## 2021-01-15 NOTE — Telephone Encounter (Signed)
Will route to Endo who has taken over pt's DM care

## 2021-01-26 ENCOUNTER — Ambulatory Visit (INDEPENDENT_AMBULATORY_CARE_PROVIDER_SITE_OTHER): Payer: Managed Care, Other (non HMO) | Admitting: Endocrinology

## 2021-01-26 ENCOUNTER — Ambulatory Visit (INDEPENDENT_AMBULATORY_CARE_PROVIDER_SITE_OTHER): Payer: Managed Care, Other (non HMO) | Admitting: Cardiovascular Disease

## 2021-01-26 ENCOUNTER — Encounter: Payer: Self-pay | Admitting: Cardiovascular Disease

## 2021-01-26 ENCOUNTER — Other Ambulatory Visit: Payer: Self-pay

## 2021-01-26 VITALS — BP 132/82 | HR 96 | Ht 66.0 in | Wt 195.6 lb

## 2021-01-26 VITALS — BP 144/82 | Ht 66.0 in | Wt 195.6 lb

## 2021-01-26 DIAGNOSIS — M79604 Pain in right leg: Secondary | ICD-10-CM | POA: Diagnosis not present

## 2021-01-26 DIAGNOSIS — E1169 Type 2 diabetes mellitus with other specified complication: Secondary | ICD-10-CM

## 2021-01-26 DIAGNOSIS — E785 Hyperlipidemia, unspecified: Secondary | ICD-10-CM

## 2021-01-26 DIAGNOSIS — I10 Essential (primary) hypertension: Secondary | ICD-10-CM

## 2021-01-26 DIAGNOSIS — E1165 Type 2 diabetes mellitus with hyperglycemia: Secondary | ICD-10-CM

## 2021-01-26 DIAGNOSIS — M79605 Pain in left leg: Secondary | ICD-10-CM

## 2021-01-26 LAB — POCT GLYCOSYLATED HEMOGLOBIN (HGB A1C): Hemoglobin A1C: 9.6 % — AB (ref 4.0–5.6)

## 2021-01-26 MED ORDER — METFORMIN HCL ER 500 MG PO TB24: 2000.0000 mg | ORAL_TABLET | Freq: Every day | ORAL | 3 refills | Status: DC

## 2021-01-26 MED ORDER — ONETOUCH VERIO VI STRP: 1.0000 | ORAL_STRIP | Freq: Every day | 3 refills | Status: AC

## 2021-01-26 MED ORDER — RYBELSUS 3 MG PO TABS: 3.0000 mg | ORAL_TABLET | Freq: Every day | ORAL | 3 refills | Status: DC

## 2021-01-26 NOTE — Progress Notes (Signed)
Subjective:    Patient ID: Zachary Lewis, male    DOB: 06-Dec-1960, 60 y.o.   MRN: [id number]  HPI Pt returns for f/u of diabetes mellitus:  DM type: 2 Dx'ed: 7471 Complications: none Therapy: metformin DKA: never Severe hypoglycemia: never Pancreatitis: never Pancreatic imaging: normal on 2020 MRI SDOH: He has not consumed EtOH since 2020 Other: he has never been on insulin. Interval history: he has not recently checked cbg.  pt states he feels well in general.  He did not start taking Rybelsus.   Past Medical History:  Diagnosis Date   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[place]) 08/04/2018   Diabetes mellitus    Type II   Hyperlipidemia    Hypertension    NSAID-induced gastric ulcer 12/13/2018   EGD 11/2018 H pylori neg Omeprazole started   Obesity    Personal history of colonic adenomas 03/09/2013   Tinea pedis    Chronic    Past Surgical History:  Procedure Laterality Date   COLONOSCOPY     COLONOSCOPY W/ POLYPECTOMY  2014   IR PARACENTESIS  07/02/2018   IR TRANSCATHETER BX  07/17/2018   IR VENOGRAM HEPATIC WO HEMODYNAMIC EVALUATION  07/17/2018   myringectomy  06/2005   POLYPECTOMY     UMBILICAL HERNIA REPAIR  01/1997   VASECTOMY  07/2002    Social History   Socioeconomic History   Marital status: Married    Spouse name: Not on file   Number of children: Not on file   Years of education: Not on file   Highest education level: Not on file  Occupational History   Occupation: [employer]    Employer: [employer]  Tobacco Use   Smoking status: Light Smoker    Packs/day: 0.25    Types: Cigarettes   Smokeless tobacco: Never  Vaping Use   Vaping Use: Never used  Substance and Sexual Activity   Alcohol use: Not Currently    Comment: Heavy use of Canadian mist daily most of the time/ stopped drinking 05-2018    Drug use: No   Sexual activity: Not on file  Other Topics Concern   Not on file  Social History Narrative   Married, 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 90 tablet 0   tadalafil (CIALIS) 20 MG tablet Take 1 tablet (20 mg total) by mouth as needed. 10 tablet 11   thiamine 100 MG tablet Take 1 tablet (100 mg total) by mouth daily. 30 tablet 0   No current facility-administered medications on file prior to visit.    No Known Allergies  Family History  Problem Relation Age of Onset   Hypertension Other    Diabetes Neg Hx    Heart disease Neg Hx    Colon cancer Neg Hx    Stomach cancer Neg Hx    Pancreatic disease Neg Hx    Colon polyps Neg  Hx    Esophageal cancer Neg Hx    Rectal cancer Neg Hx     BP (!) 144/82 (BP Location: Right Arm, Patient Position: Sitting, Cuff Size: Normal)   Ht 5' 6"  (1.676 m)   Wt 195 lb 9.6 oz (88.7 kg)   BMI 31.57 kg/m    Review of Systems Denies n/v/d/hb    Objective:   Physical Exam Pulses: dorsalis pedis intact bilat.   MSK: no deformity of the feet CV: no leg edema Skin:  no ulcer on the feet.  normal color and temp on the feet.   Neuro: sensation is intact to touch on the feet, but decreased from normal Ext: there is bilateral onychomycosis of the toenails.     Lab Results  Component Value Date   HGBA1C 9.6 (A) 01/26/2021      Assessment & Plan:  Type 2 DM: uncontrolled.    Patient Instructions  check your blood sugar once a day.  vary the time of day when you check, between before the 3 meals, and at bedtime.  also check if you have symptoms of  your blood sugar being too high or too low.  please keep a record of the readings and bring it to your next appointment here (or you can bring the meter itself).  You can write it on any piece of paper.  please call us sooner if your blood sugar goes below 70, or if you have a lot of readings over 200.  Please continue the same meformin, and: I have sent a prescription to your pharmacy, to add "Rybelsus." Please come back for a follow-up appointment in 2 months.  Blood tests are requested for you today.  We'll let you know about the results.  Our goal is to get the blood sugar to the low-100's.  If this does not happen, please call so we can increase the Rybelsus.

## 2021-01-26 NOTE — Progress Notes (Signed)
Cardiology Office Note:    Date:  01/26/2021   ID:  Zachary Lewis, DOB February 21, 1961, MRN [id number]  PCP:  Abner Greenspan, MD   [facility] Providers Cardiologist:  None     Referring MD: Abner Greenspan, MD   Chief Complaint  Patient presents with   Hypertension    Sept. 9, 2022   Zachary Lewis is a 60 y.o. male with a hx of HTN.  We were asked to see him for further evaluation Hx of HTN, DM, + smoker    Hx of HTN for several years.  Now on meds , eats a low salt diet  Exercises regularly  - 1 HR every other day ,  treadmill and stationry bike at home  No CP , no dyspnea, no syncope  Works at the [employer]   He has hyperlipidemia.  His last LDL is 181.  He is tried statins in the past but developed severe diarrhea.  We will send him to lipid clinic for consideration for PCSK9 inhibitor.  Past Medical History:  Diagnosis Date   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[place]) 08/04/2018   Diabetes mellitus    Type II   Hyperlipidemia    Hypertension    NSAID-induced gastric ulcer 12/13/2018   EGD 11/2018 H pylori neg Omeprazole started   Obesity    Personal history of colonic adenomas 03/09/2013   Tinea pedis    Chronic    Past Surgical History:  Procedure Laterality Date   COLONOSCOPY     COLONOSCOPY W/ POLYPECTOMY  2014   IR PARACENTESIS  07/02/2018   IR TRANSCATHETER BX  07/17/2018   IR VENOGRAM HEPATIC WO HEMODYNAMIC EVALUATION  07/17/2018   myringectomy  06/2005   POLYPECTOMY     UMBILICAL HERNIA REPAIR  01/1997   VASECTOMY  07/2002    Current Medications: Current Meds  Medication Sig   glucose blood (ONETOUCH VERIO) test strip 1 each by Other route daily. And lancets 1/day   metFORMIN (GLUCOPHAGE-XR) 500 MG 24 hr tablet Take 4 tablets (2,000 mg total) by mouth daily.   Semaglutide (RYBELSUS) 3 MG TABS Take 3 mg by mouth daily.   spironolactone (ALDACTONE) 50 MG tablet Take 1 tablet (50 mg total) by mouth daily.   tadalafil  (CIALIS) 20 MG tablet Take 1 tablet (20 mg total) by mouth as needed.   thiamine 100 MG tablet Take 1 tablet (100 mg total) by mouth daily.     Allergies:   Patient has no known allergies.   Social History   Socioeconomic History   Marital status: Married    Spouse name: Not on file   Number of children: Not on file   Years of education: Not on file   Highest education level: Not on file  Occupational History   Occupation: [employer]    Employer: [employer]  Tobacco Use   Smoking status: Light Smoker    Packs/day: 0.25    Types: Cigarettes   Smokeless tobacco: Never  Vaping Use   Vaping Use: Never used  Substance and Sexual Activity   Alcohol use: Not Currently    Comment: Heavy use of Canadian mist daily most of the time/ stopped drinking 05-2018    Drug use: No   Sexual activity: Not on file  Other Topics Concern   Not on file  Social History Narrative   Married, 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 There is no history of Diabetes, Heart disease, Colon cancer, Stomach cancer, Pancreatic disease, Colon polyps, Esophageal cancer, or Rectal cancer.  ROS:   Please see the history of present illness.     All other systems reviewed and are negative.  EKGs/Labs/Other Studies Reviewed:    The following studies were reviewed today:   EKG:   January 26, 2021: Normal sinus rhythm at 96.  Nonspecific T wave abnormalities in leads III and aVF.  Recent Labs: 10/31/2020: ALT 31; BUN 15; Creatinine, Ser 0.82; Hemoglobin 14.8; Platelets 234.0; Potassium 4.3; Sodium 135; TSH 4.84   Recent Lipid Panel    Component Value Date/Time   CHOL 262 (H) 10/31/2020 0738   TRIG 164.0 (H) 10/31/2020 0738   HDL 48.50 10/31/2020 0738   CHOLHDL 5 10/31/2020 0738   VLDL 32.8 10/31/2020 0738   LDLCALC 181 (H) 10/31/2020 0738   LDLDIRECT 176.9 12/08/2012 0807     Risk Assessment/Calculations:           Physical Exam:    VS:  BP 132/82   Pulse 96   Ht 5' 6"  (1.676 m)   Wt 195 lb 9.6 oz (88.7 kg)   SpO2 93%   BMI 31.57 kg/m     Wt Readings from Last 3 Encounters:  01/26/21 195 lb 9.6 oz (88.7 kg)  01/26/21 195 lb 9.6 oz (88.7 kg)  11/07/20 192 lb 6.4 oz (87.3 kg)     GEN:  Well nourished, well developed in no acute distress HEENT: Normal NECK: No JVD; No carotid bruits LYMPHATICS: No lymphadenopathy CARDIAC: RRR, no murmurs, rubs, gallops RESPIRATORY:  Clear to auscultation without rales, wheezing or rhonchi  ABDOMEN: Soft, non-tender, non-distended MUSCULOSKELETAL:  No edema; No deformity ,  poor distal leg pulses  SKIN: Warm and dry NEUROLOGIC:  Alert and oriented x 3 PSYCHIATRIC:  Normal affect   ASSESSMENT:    1. HYPERTENSION, BENIGN ESSENTIAL   2. Hyperlipidemia associated with type 2 diabetes mellitus (HCC)   3. Pain in both lower extremities    PLAN:    In order of problems listed above:  HTN:   BP is very well controlled at this point.  It was even better when he he was at his medical doctor's office last month. I encouraged him to continue to watch his diet and to work on some weight loss.  I encouraged him to stop smoking which will help his blood pressure.  He will continue to exercise.  2.  Poor peripheral leg pulses: He has occasional episodes of leg pain and burning with exercise.  He has very poor dorsalis pedis and posterior tibialis pulses.  We will send him to our vascular lab for ABIs/screening arterial duplex scan.  3.   Cigarette smoking :   adivsed cessation          Medication Adjustments/Labs and Tests Ordered: Current  medicines are reviewed at length with the patient today.  Concerns regarding medicines are outlined above.  Orders Placed This Encounter  Procedures   AMB Referral to [facility] Pharm-D   EKG  12-Lead   VAS [place] LOWER EXTREMITY ARTERIAL DUPLEX   No orders of the defined types were placed in this encounter.   Patient Instructions  Medication Instructions:  No changes *If you need a refill on your cardiac medications before your next appointment, please call your pharmacy*   Lab Work: none If you have labs (blood work) drawn today and your tests are completely normal, you will receive your results only by: [place] (if you have MyChart) OR A paper copy in the mail If you have any lab test that is abnormal or we need to change your treatment, we will call you to review the results.   Testing/Procedures: WITH ABIS (SCREENING) Your physician has requested that you have a lower extremity arterial exercise duplex. During this test, exercise and ultrasound are used to evaluate arterial blood flow in the legs. Allow one hour for this exam. There are no restrictions or special instructions.   Follow-Up: At [facility], you and your health needs are our priority.  As part of our continuing mission to provide you with exceptional heart care, we have created designated Provider Care Teams.  These Care Teams include your primary Cardiologist (physician) and Advanced Practice Providers (APPs -  Physician Assistants and Nurse Practitioners) who all work together to provide you with the care you need, when you need it.   Your next appointment:   6 month(s)  The format for your next appointment:   In Person  Provider:   You may see Dr. Acie Fredrickson or one of the following Advanced Practice Providers on your designated Care Team:   Richardson Dopp, PA-C Robbie Lis, [place]   Other Instructions  You have been referred to the lipid clinic pharmacists for lipid management   Signed, Mertie Moores,  MD  01/26/2021 4:27 PM    [place]

## 2021-01-26 NOTE — Patient Instructions (Addendum)
check your blood sugar once a day.  vary the time of day when you check, between before the 3 meals, and at bedtime.  also check if you have symptoms of your blood sugar being too high or too low.  please keep a record of the readings and bring it to your next appointment here (or you can bring the meter itself).  You can write it on any piece of paper.  please call us sooner if your blood sugar goes below 70, or if you have a lot of readings over 200.  Please continue the same meformin, and: I have sent a prescription to your pharmacy, to add "Rybelsus." Please come back for a follow-up appointment in 2 months.  Blood tests are requested for you today.  We'll let you know about the results.  Our goal is to get the blood sugar to the low-100's.  If this does not happen, please call so we can increase the Rybelsus.

## 2021-01-26 NOTE — Patient Instructions (Signed)
Medication Instructions:  No changes *If you need a refill on your cardiac medications before your next appointment, please call your pharmacy*   Lab Work: none If you have labs (blood work) drawn today and your tests are completely normal, you will receive your results only by: [place] (if you have MyChart) OR A paper copy in the mail If you have any lab test that is abnormal or we need to change your treatment, we will call you to review the results.   Testing/Procedures: WITH ABIS (SCREENING) Your physician has requested that you have a lower extremity arterial exercise duplex. During this test, exercise and ultrasound are used to evaluate arterial blood flow in the legs. Allow one hour for this exam. There are no restrictions or special instructions.   Follow-Up: At [facility], you and your health needs are our priority.  As part of our continuing mission to provide you with exceptional heart care, we have created designated Provider Care Teams.  These Care Teams include your primary Cardiologist (physician) and Advanced Practice Providers (APPs -  Physician Assistants and Nurse Practitioners) who all work together to provide you with the care you need, when you need it.   Your next appointment:   6 month(s)  The format for your next appointment:   In Person  Provider:   You may see Dr. Acie Fredrickson or one of the following Advanced Practice Providers on your designated Care Team:   Richardson Dopp, PA-C [place] Haz[place], [place]   Other Instructions  You have been referred to the lipid clinic pharmacists for lipid management

## 2021-02-01 ENCOUNTER — Other Ambulatory Visit: Payer: Self-pay | Admitting: Cardiovascular Disease

## 2021-02-01 DIAGNOSIS — E1169 Type 2 diabetes mellitus with other specified complication: Secondary | ICD-10-CM

## 2021-02-01 DIAGNOSIS — E785 Hyperlipidemia, unspecified: Secondary | ICD-10-CM

## 2021-02-01 DIAGNOSIS — M79604 Pain in right leg: Secondary | ICD-10-CM

## 2021-02-01 DIAGNOSIS — I10 Essential (primary) hypertension: Secondary | ICD-10-CM

## 2021-02-01 DIAGNOSIS — R0989 Other specified symptoms and signs involving the circulatory and respiratory systems: Secondary | ICD-10-CM

## 2021-02-12 ENCOUNTER — Other Ambulatory Visit: Payer: Self-pay

## 2021-02-12 ENCOUNTER — Ambulatory Visit (HOSPITAL_COMMUNITY)
Admission: RE | Admit: 2021-02-12 | Discharge: 2021-02-12 | Disposition: A | Discharge status: Home / Self Care | Payer: Managed Care, Other (non HMO) | Source: Ambulatory Visit | Attending: Cardiovascular Disease | Admitting: Cardiovascular Disease

## 2021-02-12 DIAGNOSIS — E1169 Type 2 diabetes mellitus with other specified complication: Secondary | ICD-10-CM | POA: Insufficient documentation

## 2021-02-12 DIAGNOSIS — M79604 Pain in right leg: Secondary | ICD-10-CM | POA: Diagnosis present

## 2021-02-12 DIAGNOSIS — E785 Hyperlipidemia, unspecified: Secondary | ICD-10-CM | POA: Insufficient documentation

## 2021-02-12 DIAGNOSIS — I10 Essential (primary) hypertension: Secondary | ICD-10-CM

## 2021-02-12 DIAGNOSIS — M79605 Pain in left leg: Secondary | ICD-10-CM | POA: Diagnosis present

## 2021-02-12 DIAGNOSIS — R0989 Other specified symptoms and signs involving the circulatory and respiratory systems: Secondary | ICD-10-CM | POA: Diagnosis present

## 2021-02-13 ENCOUNTER — Telehealth: Payer: Self-pay | Admitting: Nurse Practitioner

## 2021-02-13 DIAGNOSIS — R0989 Other specified symptoms and signs involving the circulatory and respiratory systems: Secondary | ICD-10-CM

## 2021-02-13 DIAGNOSIS — M79605 Pain in left leg: Secondary | ICD-10-CM

## 2021-02-13 DIAGNOSIS — I739 Peripheral vascular disease, unspecified: Secondary | ICD-10-CM

## 2021-02-13 DIAGNOSIS — M79604 Pain in right leg: Secondary | ICD-10-CM

## 2021-02-13 NOTE — Telephone Encounter (Signed)
Results reviewed with patient who verbalized understanding and agreement for PV consult. He is aware someone from our scheduling department will call him with an appointment. He is aware of lipid clinic appointment on 10/5 and the importance of that appointment with the addition of PAD diagnosis. He thanked me for the call.

## 2021-02-13 NOTE — Telephone Encounter (Signed)
-----   Message from Thayer Headings, MD sent at 02/12/2021  8:53 PM EDT ----- Please refer to our [facility] clinic for consultation  PN  ----- Message ----- From: Interface, [employer] Sent: 02/12/2021  10:07 AM EDT To: Thayer Headings, MD

## 2021-02-20 NOTE — Progress Notes (Signed)
Patient ID: GOMER FRANCE                 DOB: 02-10-1961                    MRN: [id number]     HPI: Zachary Lewis is a 60 y.o. male patient referred to lipid clinic by Mertie Moores for PCSK9 inhibitor. 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 Recently diagnosed with PAD. Referred to lipid clinic to discuss medication options. He has alcoholic liver disease and hx of severe diarrhea with statins.   Patient presents to lipid clinic today. States he just got new insurance. Information below. Denies any recent alcohol use.  Patient has new insurance with [place] [phone] - [employer] - [place] [id number] Patient will update MyChart with new insurance information.   Current Medications: none  Intolerances: simvastatin 53m (severe diarrhea) Risk Factors: smoker, obesity, HTN, NAFLD, T2DM LDL goal: LDL <55 due to PAD diagnosis with risk factors  Diet:  Breakfast - instant sugared oatmeal with 2 cups of coffee; multigrain piece of toast Lunch - multigrain breads with baked or grilled chicken Dinner - chicken or t[place]meat with green beans; salads Snacks - peanuts - not a huge snacker No chips or sweets Drinks - water and coffee  Exercise: Patient alternates lifting weights and walking on treadmill for an hour each day every day.  Family History: HTN  Social History: 0.25 packs/day, no recent alcohol use  Labs: 10/31/20 TC 262 HDL 48 LDL 181 TG 164 A1C 10.6 AST 19 ALT 31 (no therapy)  Past Medical History:  Diagnosis Date   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[place] 08/04/2018   Diabetes mellitus    Type II   Hyperlipidemia    Hypertension    NSAID-induced gastric ulcer 12/13/2018   EGD 11/2018 H pylori neg Omeprazole started   Obesity    Personal history of colonic adenomas 03/09/2013   Tinea pedis    Chronic    Current Outpatient Medications on File Prior to Visit   Medication Sig Dispense Refill   glucose blood (ONETOUCH VERIO) test strip 1 each by Other route daily. And lancets 1/day 100 each 3   metFORMIN (GLUCOPHAGE-XR) 500 MG 24 hr tablet Take 4 tablets (2,000 mg total) by mouth daily. 360 tablet 3   Semaglutide (RYBELSUS) 3 MG TABS Take 3 mg by mouth daily. 90 tablet 3   spironolactone (ALDACTONE) 50 MG tablet Take 1 tablet (50 mg total) by mouth daily. 90 tablet 0   tadalafil (CIALIS) 20 MG tablet Take 1 tablet (20 mg total) by mouth as needed. 10 tablet 11   thiamine 100 MG tablet Take 1 tablet (100 mg total) by mouth daily. 30 tablet 0   No current facility-administered medications on file prior to visit.    No Known Allergies  Assessment/Plan:  1. Hyperlipidemia - LDL not at goal of <55. Patient was counseled patient on importance of lipid-lowering therapy with CAD and newly diagnosed PAD.   Past liver function tests from June 2022 are normal and chronic liver disease appears stable with no recent ascites or related complications. Statins are not absolutely contraindicated in stable chronic liver disease; however, PCSK9 inhibitor might be safer in this patient due to past medical history. Counseled on injection technique and will start to submit info to start on Repatha.  Patient regularly exercises and he was encouraged to incorporate more vegetables into diet.    Thank you,  Ramond Dial, Pharm.D, BCPS, CPP [facility]  [address], [place], [place] [address]  Phone: [phone]; Fax: [phone]

## 2021-02-21 ENCOUNTER — Ambulatory Visit (INDEPENDENT_AMBULATORY_CARE_PROVIDER_SITE_OTHER): Payer: No Typology Code available for payment source | Admitting: Pharmacist

## 2021-02-21 ENCOUNTER — Other Ambulatory Visit: Payer: Self-pay

## 2021-02-21 DIAGNOSIS — E785 Hyperlipidemia, unspecified: Secondary | ICD-10-CM

## 2021-02-21 DIAGNOSIS — K709 Alcoholic liver disease, unspecified: Secondary | ICD-10-CM

## 2021-02-21 DIAGNOSIS — E1169 Type 2 diabetes mellitus with other specified complication: Secondary | ICD-10-CM

## 2021-02-21 NOTE — Patient Instructions (Signed)
We will submit to insurance for Repatha  I will call you once I hear back from them  Please call me at [phone] with any questions  Repatha is a cholesterol medication that improved your body's ability to get rid of "bad cholesterol" known as LDL. It can lower your LDL up to 60%! It is an injection that is given under the skin every 2 weeks. The medication often requires a prior authorization from your insurance company. We will take care of submitting all the necessary information to your insurance company to get it approved.  Instructions for injection:  Remove Pen from the refrigerator at least 30 minutes prior to injecting. Allow to come to room temperature. Do NOT try to warm the pen using a heat source, Do NOT leave in direct sunlight, Do NOT shake Inspect pen. Do not use if pen is expired, cracked, medication is cloudy or discolored or if orange cap is missing Clean the injection site with an alcohol swab or soapy water and dry completley. Always rotate injection sites. Do not use the same site as your previous injection. Do not inject in an area that is tender, bruised, red or hard.   Pull the orange cap off when you are ready to inject. Do not leave cap off for more than 5 minutes. Do not put the cap back on. Create a firm surface area by either using the stretch method or the pinch method Keep holding the stretched or pinched skin. Put the yellow safety guard on your skin at a 90 degree angle.  7. Firmly push down the pen onto the skin until it stops moving. When you are ready to inject push the gray button, you will hear a click.   8. Keep pushing the pen down on your skin until you hear the SECOND click (could take about 15 seconds). You then may remove the pen. The window on the view finder should be yellow. If it has not turned yellow, then all of the medication was not injected. 9. Dispose of the pen in a sharps container. If you do not have a sharps container you may use a  container that is made of heavy plastic (such as a laundry detergent container that has a puncture proof lid). Label it with a marker as "hazardous waste"   Visit https://www.pi.amgen.com/~/media/amgen/repositorysites/pi-amgen-com/repatha/repatha_ifu_hcp_pt_english.pdf for more detailed instructions

## 2021-02-22 ENCOUNTER — Telehealth: Payer: Self-pay | Admitting: Pharmacist

## 2021-02-22 MED ORDER — REPATHA SURECLICK 140 MG/ML ~~LOC~~ SOAJ: 1.0000 "pen " | SUBCUTANEOUS | 3 refills | Status: DC

## 2021-02-22 NOTE — Telephone Encounter (Signed)
Repatha approved through 08/22/21  RxBin: [id number]  RxPCN: [id number]  RxGrp: [id number]  ID: [id number]  Patient made aware of approval. Rx card called into [facility]. Will call pt in ~ 2 months to set up lipid panel and LFT as pt could not schedule right now.

## 2021-02-27 ENCOUNTER — Other Ambulatory Visit (HOSPITAL_COMMUNITY): Payer: Self-pay

## 2021-02-27 ENCOUNTER — Telehealth: Payer: Self-pay | Admitting: Pharmacy Technician

## 2021-02-27 NOTE — Telephone Encounter (Signed)
Patient Advocate Encounter   Received notification from CoverMyMeds that prior authorization for Rybelsus 52m is required.   PA submitted on 02/27/21 Key  [id number] Status is pending    [facility]will [facility] to follow:   AArmanda Magic CPhT [facility] Phone: [phone]:  [phone]

## 2021-02-27 NOTE — Telephone Encounter (Signed)
[facility] Patient Advocate Encounter  Prior Authorization for Rybelsus 46m has been approved.    PA# [id number] dates: 02/27/21 through 02/27/22  Patients co-pay is $867.78.   AArmanda Magic CPhT [facility] Phone: [phone]:  [phone]

## 2021-02-27 NOTE — Telephone Encounter (Signed)
Message sent thru MyChart regarding the Rybelses being too expensive and wanting to see if he wanted to try the once a week injection. Waiting on response.

## 2021-03-27 ENCOUNTER — Other Ambulatory Visit: Payer: Self-pay | Admitting: Family Medicine

## 2021-03-27 ENCOUNTER — Encounter: Payer: Self-pay | Admitting: Cardiovascular Disease

## 2021-03-27 ENCOUNTER — Ambulatory Visit: Payer: Managed Care, Other (non HMO) | Admitting: Endocrinology

## 2021-03-27 ENCOUNTER — Ambulatory Visit (INDEPENDENT_AMBULATORY_CARE_PROVIDER_SITE_OTHER): Payer: No Typology Code available for payment source | Admitting: Cardiovascular Disease

## 2021-03-27 ENCOUNTER — Other Ambulatory Visit: Payer: Self-pay

## 2021-03-27 VITALS — BP 154/98 | HR 89 | Ht 66.0 in | Wt 203.0 lb

## 2021-03-27 DIAGNOSIS — I739 Peripheral vascular disease, unspecified: Secondary | ICD-10-CM | POA: Diagnosis not present

## 2021-03-27 DIAGNOSIS — M79604 Pain in right leg: Secondary | ICD-10-CM

## 2021-03-27 DIAGNOSIS — M79605 Pain in left leg: Secondary | ICD-10-CM

## 2021-03-27 DIAGNOSIS — E785 Hyperlipidemia, unspecified: Secondary | ICD-10-CM | POA: Diagnosis not present

## 2021-03-27 DIAGNOSIS — Z720 Tobacco use: Secondary | ICD-10-CM

## 2021-03-27 MED ORDER — ASPIRIN EC 81 MG PO TBEC: 81.0000 mg | DELAYED_RELEASE_TABLET | Freq: Every day | ORAL | 3 refills | Status: AC

## 2021-03-27 NOTE — Progress Notes (Signed)
Cardiology Office Note   Date:  03/30/2021   ID:  Zachary Lewis, DOB 1960/07/28, MRN [id number]  PCP:  Abner Greenspan, MD  Cardiologist:  Dr. Acie Fredrickson  No chief complaint on file.     History of Present Illness: Zachary Lewis is a 60 y.o. male who Was referred by Dr. Acie Fredrickson for evaluation management of peripheral arterial disease. He has known history of essential hypertension, type 2 diabetes, hyperlipidemia, liver cirrhosis due to NASH and tobacco use. He reports bilateral calf and feet discomfort that started about 3 to 4 months ago.  It is equal on both sides and it happens after walking about 1 mile.  He has no rest pain or lower extremity ulceration. He underwent noninvasive vascular evaluation in September which showed an ABI of 0.81 on the right and 0.72 on the left.  Duplex showed mainly below the knee disease bilaterally.  On the right side, there was one-vessel runoff via the peroneal artery.  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  He denies chest pain or shortness of breath.  He has history of intolerance to statins and was recently prescribed Repatha for hyperlipidemia.  He is also trying to cut down on tobacco use.  Past Medical History:  Diagnosis Date   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[place]) 08/04/2018   Diabetes mellitus    Type II   Hyperlipidemia    Hypertension    NSAID-induced gastric ulcer 12/13/2018   EGD 11/2018 H pylori neg Omeprazole started   Obesity    Personal history of colonic adenomas 03/09/2013   Tinea pedis    Chronic    Past Surgical History:  Procedure Laterality Date   COLONOSCOPY     COLONOSCOPY W/ POLYPECTOMY  2014   IR PARACENTESIS  07/02/2018   IR TRANSCATHETER BX  07/17/2018   IR VENOGRAM HEPATIC WO HEMODYNAMIC EVALUATION  07/17/2018   myringectomy  06/2005   POLYPECTOMY     UMBILICAL HERNIA REPAIR  01/1997   VASECTOMY  07/2002      Current Outpatient Medications  Medication Sig Dispense Refill   aspirin EC 81 MG tablet Take 1 tablet (81 mg total) by mouth daily. Swallow whole. 90 tablet 3   Evolocumab (REPATHA SURECLICK) 250 MG/ML SOAJ Inject 1 pen into the skin every 14 (fourteen) days. 6 mL 3   glucose blood (ONETOUCH VERIO) test strip 1 each by Other route daily. And lancets 1/day 100 each 3   latanoprost (XALATAN) 0.005 % ophthalmic solution SMARTSIG:In Eye(s)     metFORMIN (GLUCOPHAGE-XR) 500 MG 24 hr tablet Take 4 tablets (2,000 mg total) by mouth daily. 360 tablet 3   Semaglutide (RYBELSUS) 3 MG TABS Take 3 mg by mouth daily. 90 tablet 3   tadalafil (CIALIS) 20 MG tablet Take 1 tablet (20 mg total) by mouth as needed. 10 tablet 11   thiamine 100 MG tablet Take 1 tablet (100 mg total) by mouth daily. 30 tablet 0   spironolactone (ALDACTONE) 50 MG tablet Take 1 tablet (50 mg total) by mouth daily. 90 tablet 1   No current facility-administered medications for this visit.    Allergies:   Patient has no known allergies.    Social History:  The patient  reports that he has been smoking cigarettes. He has been smoking an average of .25 packs per day. He has never used smokeless tobacco. He  reports that he does not currently use alcohol. He reports that he does not use drugs.   Family History:  The patient's family history includes Hypertension in an other family member.    ROS:  Please see the history of present illness.   Otherwise, review of systems are positive for none.   All other systems are reviewed and negative.    PHYSICAL EXAM: VS:  BP (!) 154/98   Pulse 89   Ht 5' 6"  (1.676 m)   Wt 203 lb (92.1 kg)   SpO2 99%   BMI 32.77 kg/m  , BMI Body mass index is 32.77 kg/m. GEN: Well nourished, well developed, in no acute distress  HEENT: normal  Neck: no JVD, carotid bruits, or masses Cardiac: RRR; no murmurs, rubs, or gallops,no edema  Respiratory:  clear to auscultation bilaterally, normal  work of breathing GI: soft, nontender, nondistended, + BS MS: no deformity or atrophy  Skin: warm and dry, no rash Neuro:  Strength and sensation are intact Psych: euthymic mood, full affect Vascular: Femoral pulses normal bilaterally.  Distal pulses are not palpable.  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   EKG:  EKG is not ordered today.    Recent Labs: 10/31/2020: ALT 31; BUN 15; Creatinine, Ser 0.82; Hemoglobin 14.8; Platelets 234.0; Potassium 4.3; Sodium 135; TSH 4.84    Lipid Panel    Component Value Date/Time   CHOL 262 (H) 10/31/2020 0738   TRIG 164.0 (H) 10/31/2020 0738   HDL 48.50 10/31/2020 0738   CHOLHDL 5 10/31/2020 0738   VLDL 32.8 10/31/2020 0738   LDLCALC 181 (H) 10/31/2020 0738   LDLDIRECT 176.9 12/08/2012 0807      Wt Readings from Last 3 Encounters:  03/27/21 203 lb (92.1 kg)  01/26/21 195 lb 9.6 oz (88.7 kg)  01/26/21 195 lb 9.6 oz (88.7 kg)       No flowsheet data found.    ASSESSMENT AND PLAN:  1.  Peripheral arterial disease: Bilateral calf and feet claudication due to below the knee disease in the tibial peroneal vessels.  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  His symptoms are currently not lifestyle limiting but we have to determine if the left popliteal artery aneurysm need to be addressed at this time.  To better visualize this, I requested CT aortogram with lower extremity runoff. I added aspirin 81 mg once daily and asked him to start a daily exercise program.  2.  Tobacco use: I discussed with him the importance of smoking cessation in order to prevent progression to critical limb ischemia.  3.  Hyperlipidemia: Intolerance to statins.  Most recent lipid profile showed an LDL of 181.  I agree with adding Repatha.     Disposition:   FU with me in 2 months  Signed,  Kathlyn Sacramento, MD  03/30/2021 2:33 PM    [place]

## 2021-03-27 NOTE — Patient Instructions (Addendum)
Medication Instructions:  START Aspirin 81 mg once daily  *If you need a refill on your cardiac medications before your next appointment, please call your pharmacy*   Lab Work: None ordered If you have labs (blood work) drawn today and your tests are completely normal, you will receive your results only by: [facility] (if you have MyChart) OR A paper copy in the mail If you have any lab test that is abnormal or we need to change your treatment, we will call you to review the results.   Testing/Procedures: Dr. Fletcher Anon has ordered a CT Angio of the lower extremities. They will call you to schedule this.   Non-Cardiac CT Angiography (CTA), is a special type of CT scan that uses a computer to produce multi-dimensional views of major blood vessels throughout the body. In CT angiography, a contrast material is injected through an IV to help visualize the blood vessels   Follow-Up: At [facility], you and your health needs are our priority.  As part of our continuing mission to provide you with exceptional heart care, we have created designated Provider Care Teams.  These Care Teams include your primary Cardiologist (physician) and Advanced Practice Providers (APPs -  Physician Assistants and Nurse Practitioners) who all work together to provide you with the care you need, when you need it.  We recommend signing up for the patient portal called "MyChart".  Sign up information is provided on this After Visit Summary.  MyChart is used to connect with patients for Virtual Visits (Telemedicine).  Patients are able to view lab/test results, encounter notes, upcoming appointments, etc.  Non-urgent messages can be sent to your provider as well.   To learn more about what you can do with MyChart, go to [email or web address].    Your next appointment:   2 month(s)  The format for your next appointment:   In Person  Provider:  Kathlyn Sacramento, MD   EXERCISE PROGRAM FOR INDIVIDUALS WITH   PERIPHERAL ARTERIAL DISEASE (PAD)   General Information:   Research in vascular exercise has demonstrated remarkable improvement in symptoms of leg pain (claudication) without expensive or invasive interventions. Regular walking programs are extremely helpful for patients with PAD and intermittent claudication.  These steps are designed to help you get started with a safe and effective program to help you walk farther with less pain:   Walk at least three times a week (preferably every day).  Your goal is to build up to 30-45 minutes of total walking time (not counting rest breaks). It may take you several weeks to build up your exercise time starting at 5-10 minutes or whatever you can tolerate.  Walk as far as possible using moderate to maximal pain (7-8 on the scale below) as a signal to stop, and resume walking when the pain goes away.  On a treadmill, set the speed and grade at a level that brings on the claudication pain within 3 to 5 minutes. Walk at this rate until you experience claudication of moderate severity, rest until the pain improves, and then resume walking.  Over time, you will be able to walk longer at the designated speed and grade; workload should then be increased until you develop the pain within 3 to 5 minutes once again.  This regimen will induce a significant benefit. Studies have demonstrated that participants may be able to walk up to three or four times farther and have less leg pain, within twelve weeks, by following this protocol.  Pain Scale  0_____1_____2_____3_____4_____5_____6_____7_____8_____9_____10   No Pain                                   Moderate Pain                               Maximal Pain

## 2021-03-29 MED ORDER — SPIRONOLACTONE 50 MG PO TABS: 50.0000 mg | ORAL_TABLET | Freq: Every day | ORAL | 1 refills | Status: DC

## 2021-04-20 ENCOUNTER — Encounter: Payer: Self-pay | Admitting: Pharmacist

## 2021-04-30 ENCOUNTER — Ambulatory Visit
Admission: RE | Admit: 2021-04-30 | Discharge: 2021-04-30 | Disposition: A | Discharge status: Home / Self Care | Payer: No Typology Code available for payment source | Source: Ambulatory Visit | Attending: Cardiovascular Disease | Admitting: Cardiovascular Disease

## 2021-04-30 DIAGNOSIS — I739 Peripheral vascular disease, unspecified: Secondary | ICD-10-CM

## 2021-04-30 DIAGNOSIS — M79604 Pain in right leg: Secondary | ICD-10-CM

## 2021-04-30 MED ORDER — IOPAMIDOL (ISOVUE-300) INJECTION 61%
100.0000 mL | Freq: Once | INTRAVENOUS | Status: AC | PRN
  Administered 2021-04-30: 100 mL via INTRAVENOUS

## 2021-05-01 ENCOUNTER — Telehealth: Payer: Self-pay

## 2021-05-01 ENCOUNTER — Inpatient Hospital Stay: Admission: RE | Admit: 2021-05-01 | Payer: No Typology Code available for payment source | Source: Ambulatory Visit

## 2021-05-01 DIAGNOSIS — E1169 Type 2 diabetes mellitus with other specified complication: Secondary | ICD-10-CM

## 2021-05-01 NOTE — Telephone Encounter (Signed)
Lmom to schedule labs

## 2021-05-01 NOTE — Telephone Encounter (Signed)
-----   Message from Ramond Dial, [place] sent at 05/01/2021  4:38 PM EST -----  ----- Message ----- From: Ramond Dial, RPH-CPP Sent: 04/19/2021  12:00 AM EST To: Ramond Dial, RPH-CPP  Set up lipids/LFT

## 2021-05-15 ENCOUNTER — Other Ambulatory Visit: Payer: Self-pay | Admitting: Pharmacist

## 2021-05-15 ENCOUNTER — Encounter: Payer: Self-pay | Admitting: Pharmacist

## 2021-05-15 DIAGNOSIS — E785 Hyperlipidemia, unspecified: Secondary | ICD-10-CM

## 2021-05-15 DIAGNOSIS — E1169 Type 2 diabetes mellitus with other specified complication: Secondary | ICD-10-CM

## 2021-05-25 ENCOUNTER — Telehealth: Payer: Self-pay

## 2021-05-25 ENCOUNTER — Telehealth: Payer: Self-pay | Admitting: Family Medicine

## 2021-05-25 NOTE — Telephone Encounter (Signed)
SPOKE WITH AMY REQUESTED LAST LABS WITH LIPID , GAVE FAX NUMBER

## 2021-05-25 NOTE — Telephone Encounter (Signed)
[facility]  [phone] Fax: [phone]  Appt on 17th, need most recent lipid panel, show two future orders, but not showing drawn  Please follow-up

## 2021-05-25 NOTE — Telephone Encounter (Signed)
Cardiologist (Dr. Fletcher Anon) is on Epic also, no recent lipid labs done last lipid lab was the one in [place] on 10/31/20  Will route to cardiologist so they are aware

## 2021-06-04 NOTE — Progress Notes (Deleted)
Cardiology Office Note   Date:  06/04/2021   ID:  Zachary Lewis, DOB March 11, 1961, MRN [id number]  PCP:  Abner Greenspan, MD  Cardiologist:  Dr. Acie Fredrickson  No chief complaint on file.      History of Present Illness: Zachary Lewis is a 61 y.o. male who is here today for follow-up visit regarding peripheral arterial disease.   He has known history of essential hypertension, type 2 diabetes, hyperlipidemia, liver cirrhosis due to NASH and tobacco use. He was seen recently for bilateral calf claudication with no rest pain or lower extremity ulceration.   He underwent noninvasive vascular evaluation in September, 2022 which showed an ABI of 0.81 on the right and 0.72 on the left.  Duplex showed mainly below the knee disease bilaterally.  On the right side, there was one-vessel runoff via the peroneal artery.  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  He has history of intolerance to statins and currently is being treated with Repatha.    CT angiogram of the aorta showed lower extremity runoff confirmed the presence of left popliteal artery aneurysm with occluded tibial peroneal vessels.  Past Medical History:  Diagnosis Date   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[place]) 08/04/2018   Diabetes mellitus    Type II   Hyperlipidemia    Hypertension    NSAID-induced gastric ulcer 12/13/2018   EGD 11/2018 H pylori neg Omeprazole started   Obesity    Personal history of colonic adenomas 03/09/2013   Tinea pedis    Chronic    Past Surgical History:  Procedure Laterality Date   COLONOSCOPY     COLONOSCOPY W/ POLYPECTOMY  2014   IR PARACENTESIS  07/02/2018   IR TRANSCATHETER BX  07/17/2018   IR VENOGRAM HEPATIC WO HEMODYNAMIC EVALUATION  07/17/2018   myringectomy  06/2005   POLYPECTOMY     UMBILICAL HERNIA REPAIR  01/1997   VASECTOMY  07/2002     Current Outpatient Medications  Medication Sig  Dispense Refill   aspirin EC 81 MG tablet Take 1 tablet (81 mg total) by mouth daily. Swallow whole. 90 tablet 3   Evolocumab (REPATHA SURECLICK) 283 MG/ML SOAJ Inject 1 pen into the skin every 14 (fourteen) days. 6 mL 3   glucose blood (ONETOUCH VERIO) test strip 1 each by Other route daily. And lancets 1/day 100 each 3   latanoprost (XALATAN) 0.005 % ophthalmic solution SMARTSIG:In Eye(s)     metFORMIN (GLUCOPHAGE-XR) 500 MG 24 hr tablet Take 4 tablets (2,000 mg total) by mouth daily. 360 tablet 3   Semaglutide (RYBELSUS) 3 MG TABS Take 3 mg by mouth daily. 90 tablet 3   spironolactone (ALDACTONE) 50 MG tablet Take 1 tablet (50 mg total) by mouth daily. 90 tablet 1   tadalafil (CIALIS) 20 MG tablet Take 1 tablet (20 mg total) by mouth as needed. 10 tablet 11   thiamine 100 MG tablet Take 1 tablet (100 mg total) by mouth daily. 30 tablet 0   No current facility-administered medications for this visit.    Allergies:   Patient has no known allergies.    Social History:  The patient  reports that he has been smoking cigarettes. He has been smoking an average of .25 packs per day. He has never used smokeless tobacco. He reports that he does not currently use alcohol. He reports that he does not use  drugs.   Family History:  The patient's family history includes Hypertension in an other family member.    ROS:  Please see the history of present illness.   Otherwise, review of systems are positive for none.   All other systems are reviewed and negative.    PHYSICAL EXAM: VS:  There were no vitals taken for this visit. , BMI There is no height or weight on file to calculate BMI. GEN: Well nourished, well developed, in no acute distress  HEENT: normal  Neck: no JVD, carotid bruits, or masses Cardiac: RRR; no murmurs, rubs, or gallops,no edema  Respiratory:  clear to auscultation bilaterally, normal work of breathing GI: soft, nontender, nondistended, + BS MS: no deformity or atrophy  Skin:  warm and dry, no rash Neuro:  Strength and sensation are intact Psych: euthymic mood, full affect Vascular: Femoral pulses normal bilaterally.  Distal pulses are not palpable.  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   EKG:  EKG is not ordered today.    Recent Labs: 10/31/2020: ALT 31; BUN 15; Creatinine, Ser 0.82; Hemoglobin 14.8; Platelets 234.0; Potassium 4.3; Sodium 135; TSH 4.84    Lipid Panel    Component Value Date/Time   CHOL 262 (H) 10/31/2020 0738   TRIG 164.0 (H) 10/31/2020 0738   HDL 48.50 10/31/2020 0738   CHOLHDL 5 10/31/2020 0738   VLDL 32.8 10/31/2020 0738   LDLCALC 181 (H) 10/31/2020 0738   LDLDIRECT 176.9 12/08/2012 0807      Wt Readings from Last 3 Encounters:  03/27/21 203 lb (92.1 kg)  01/26/21 195 lb 9.6 oz (88.7 kg)  01/26/21 195 lb 9.6 oz (88.7 kg)       No flowsheet data found.    ASSESSMENT AND PLAN:  1.  Peripheral arterial disease: Bilateral calf and feet claudication due to below the knee disease in the tibial peroneal vessels.  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  His symptoms are currently not lifestyle limiting but we have to determine if the left popliteal artery aneurysm need to be addressed at this time.  To better visualize this, I requested CT aortogram with lower extremity runoff. I added aspirin 81 mg once daily and asked him to start a daily exercise program.  2.  Tobacco use: I discussed with him the importance of smoking cessation in order to prevent progression to critical limb ischemia.  3.  Hyperlipidemia: Intolerance to statins.  Most recent lipid profile showed an LDL of 181.  I agree with adding Repatha.     Disposition:   FU with me in 2 months  Signed,  Kathlyn Sacramento, MD  06/04/2021 5:31 PM    [place]

## 2021-06-05 ENCOUNTER — Ambulatory Visit: Payer: No Typology Code available for payment source | Admitting: Cardiovascular Disease

## 2021-07-24 ENCOUNTER — Ambulatory Visit (INDEPENDENT_AMBULATORY_CARE_PROVIDER_SITE_OTHER): Payer: No Typology Code available for payment source | Admitting: Cardiovascular Disease

## 2021-07-24 ENCOUNTER — Encounter: Payer: Self-pay | Admitting: Cardiovascular Disease

## 2021-07-24 ENCOUNTER — Other Ambulatory Visit: Payer: Self-pay

## 2021-07-24 VITALS — BP 120/80 | HR 77 | Ht 67.0 in | Wt 196.0 lb

## 2021-07-24 DIAGNOSIS — I739 Peripheral vascular disease, unspecified: Secondary | ICD-10-CM

## 2021-07-24 DIAGNOSIS — Z720 Tobacco use: Secondary | ICD-10-CM

## 2021-07-24 DIAGNOSIS — E785 Hyperlipidemia, unspecified: Secondary | ICD-10-CM | POA: Diagnosis not present

## 2021-07-24 MED ORDER — CLOPIDOGREL BISULFATE 75 MG PO TABS: 75.0000 mg | ORAL_TABLET | Freq: Every day | ORAL | 3 refills | Status: DC

## 2021-07-24 NOTE — Patient Instructions (Signed)
Medication Instructions:  ?Start Plavix 75 mg daily  ? ?*If you need a refill on your cardiac medications before your next appointment, please call your pharmacy* ? ? ?Follow-Up: ?At [facility], you and your health needs are our priority.  As part of our continuing mission to provide you with exceptional heart care, we have created designated Provider Care Teams.  These Care Teams include your primary Cardiologist (physician) and Advanced Practice Providers (APPs -  Physician Assistants and Nurse Practitioners) who all work together to provide you with the care you need, when you need it. ? ?We recommend signing up for the patient portal called "MyChart".  Sign up information is provided on this After Visit Summary.  MyChart is used to connect with patients for Virtual Visits (Telemedicine).  Patients are able to view lab/test results, encounter notes, upcoming appointments, etc.  Non-urgent messages can be sent to your provider as well.   ?To learn more about what you can do with MyChart, go to [email or web address].   ? ?Your next appointment:   ?4 month(s) ? ?The format for your next appointment:   ?In Person ? ?Provider:   ?Kathlyn Sacramento, MD  ? ? ? ?

## 2021-07-24 NOTE — Progress Notes (Signed)
?  ?Cardiology Office Note ? ? ?Date:  07/24/2021  ? ?ID:  Zachary Lewis, DOB 12-24-1960, MRN [id number] ? ?PCP:  Abner Greenspan, MD  ?Cardiologist:  Dr. Acie Fredrickson ? ?No chief complaint on file. ? ? ? ?  ?History of Present Illness: ?Zachary Lewis is a 61 y.o. male who is here today for follow-up visit regarding peripheral arterial disease.   ?He has known history of essential hypertension, type 2 diabetes, hyperlipidemia, liver cirrhosis due to NASH and tobacco use. ?He was seen recently for bilateral calf and feet discomfort of 3 to 4 months of duration.   ?He underwent noninvasive vascular evaluation in September which showed an ABI of 0.81 on the right and 0.72 on the left.  Duplex showed mainly below the knee disease bilaterally.  On the right side, there was one-vessel runoff via the peroneal artery.  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 ?He reports that his symptoms are overall stable.  He continues to work with no significant limitations.  In addition, he exercises on the treadmill at least 3 times per week for 30 minutes with minimal symptoms except for some numbness in his feet. ?He quit smoking for 3 weeks but started smoking again but less than before.  He has not started taking Repatha yet. ? ?Past Medical History:  ?Diagnosis Date  ? 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[place]) 08/04/2018  ? Diabetes mellitus   ? Type II  ? Hyperlipidemia   ? Hypertension   ? NSAID-induced gastric ulcer 12/13/2018  ? EGD 11/2018 H pylori neg Omeprazole started  ? Obesity   ? Personal history of colonic adenomas 03/09/2013  ? Tinea pedis   ? Chronic  ? ? ?Past Surgical History:  ?Procedure Laterality Date  ? COLONOSCOPY    ?  COLONOSCOPY W/ POLYPECTOMY  2014  ? IR PARACENTESIS  07/02/2018  ? IR TRANSCATHETER BX  07/17/2018  ? IR VENOGRAM HEPATIC WO HEMODYNAMIC EVALUATION  07/17/2018  ? myringectomy  06/2005  ? POLYPECTOMY    ? UMBILICAL HERNIA REPAIR  01/1997  ? VASECTOMY  07/2002  ? ? ? ?Current Outpatient Medications  ?Medication Sig Dispense Refill  ? aspirin EC 81 MG tablet Take 1 tablet (81 mg total) by mouth daily. Swallow whole. 90 tablet 3  ? clopidogrel (PLAVIX) 75 MG tablet Take 1 tablet (75 mg total) by mouth daily. 90 tablet 3  ? glucose blood (ONETOUCH VERIO) test strip 1 each by Other route daily. And lancets 1/day 100 each 3  ? latanoprost (XALATAN) 0.005 % ophthalmic solution SMARTSIG:In Eye(s)    ? metFORMIN (GLUCOPHAGE-XR) 500 MG 24 hr tablet Take 4 tablets (2,000 mg total) by mouth daily. 360 tablet 3  ? Semaglutide (RYBELSUS) 3 MG TABS Take 3 mg by mouth daily. 90 tablet 3  ? spironolactone (ALDACTONE) 50 MG tablet Take 1 tablet (50 mg total) by mouth daily. 90 tablet 1  ? tadalafil (CIALIS) 20 MG tablet Take 1 tablet (20 mg total) by mouth as needed. 10 tablet 11  ? thiamine 100 MG tablet Take 1 tablet (100 mg total) by mouth daily. 30 tablet 0  ? Evolocumab (REPATHA SURECLICK) 875 MG/ML SOAJ Inject 1 pen into the skin  every 14 (fourteen) days. (Patient not taking: Reported on 07/24/2021) 6 mL 3  ? ?No current facility-administered medications for this visit.  ? ? ?Allergies:   Patient has no known allergies.  ? ? ?Social History:  The patient  reports that he has been smoking cigarettes. He has been smoking an average of .25 packs per day. He has never used smokeless tobacco. He reports that he does not currently use alcohol. He reports that he does not use drugs.  ? ?Family History:  The patient's family history includes Hypertension in an other family member.  ? ? ?ROS:  Please see the history of present illness.   Otherwise, review of systems are positive for none.   All other systems are reviewed and negative.   ? ? ?PHYSICAL EXAM: ?VS:  BP 120/80 (BP Location: Left Arm, Patient Position: Sitting)   Pulse 77   Ht 5' 7"  (1.702 m)   Wt 196 lb (88.9 kg)   SpO2 95%   BMI 30.70 kg/m?  , BMI Body mass index is 30.7 kg/m?. ?GEN: Well nourished, well developed, in no acute distress  ?HEENT: normal  ?Neck: no JVD, carotid bruits, or masses ?Cardiac: RRR; no murmurs, rubs, or gallops,no edema  ?Respiratory:  clear to auscultation bilaterally, normal work of breathing ?GI: soft, nontender, nondistended, + BS ?MS: no deformity or atrophy  ?Skin: warm and dry, no rash ?Neuro:  Strength and sensation are intact ?Psych: euthymic mood, full affect ?Vascular: Femoral pulses normal bilaterally.  Distal pulses are not palpable.   ? ? ?EKG:  EKG is not ordered today. ? ? ? ?Recent Labs: ?10/31/2020: ALT 31; BUN 15; Creatinine, Ser 0.82; Hemoglobin 14.8; Platelets 234.0; Potassium 4.3; Sodium 135; TSH 4.84  ? ? ?Lipid Panel ?   ?Component Value Date/Time  ? CHOL 262 (H) 10/31/2020 2336  ? TRIG 164.0 (H) 10/31/2020 1224  ? HDL 48.50 10/31/2020 0738  ? CHOLHDL 5 10/31/2020 0738  ? VLDL 32.8 10/31/2020 0738  ? [place] 181 (H) 10/31/2020 4975  ? LDLDIRECT 176.9 12/08/2012 0807  ? ?  ? ?Wt Readings from Last 3 Encounters:  ?07/24/21 196 lb (88.9 kg)  ?03/27/21 203 lb (92.1 kg)  ?01/26/21 195 lb 9.6 oz (88.7 kg)  ?  ? ? ? ?No flowsheet data found. ? ? ? ?ASSESSMENT AND PLAN: ? ?1.  Peripheral arterial disease: Bilateral calf and feet claudication due to below the knee disease in the tibial peroneal vessels.  He does have a left popliteal artery aneurysm but the TP trunk and anterior tibial arteries are both occluded proximally likely from embolization.  Thus, risk of further embolization is low and thus there is likely very little benefit of treating the left popliteal artery aneurysm.  I elected to add Plavix 75 mg once daily.  Continue aspirin. ?Currently he has mild claudication and bilateral foot numbness with exercise.  Angiography would be  reserved for worsening symptoms. ? ?2.  Tobacco use: He quit smoking but relapsed again.  I discussed with him the importance of smoking cessation. ? ?3.  Hyperlipidemia: Intolerance to statins.  Most recent lipid profile showed an LDL of 181.  Prolonged discussion with him about the importance of starting Repatha.  He is scared of needles but I explained to him the importance of taking this.  He seemed to be receptive. ? ? ? ?Disposition:   FU with me in 4 months ? ?Signed, ? ?Kathlyn Sacramento, MD  ?07/24/2021 1:28 PM    ?[facility] ? ?

## 2021-08-02 ENCOUNTER — Other Ambulatory Visit: Payer: No Typology Code available for payment source

## 2021-08-02 ENCOUNTER — Ambulatory Visit: Payer: No Typology Code available for payment source | Admitting: Cardiovascular Disease

## 2021-08-30 ENCOUNTER — Telehealth: Payer: Self-pay

## 2021-08-30 NOTE — Telephone Encounter (Signed)
Lmom to schedule fasting lipid panel to check repatha efficacy  ?

## 2021-09-26 ENCOUNTER — Telehealth: Payer: Self-pay

## 2021-09-26 NOTE — Telephone Encounter (Signed)
Called and lmom pt regarding the need for a Fasting lipid panel on 11/27/21 when they see dr Fletcher Anon [facility] ?

## 2021-10-17 ENCOUNTER — Other Ambulatory Visit: Payer: Self-pay | Admitting: Family Medicine

## 2021-11-26 NOTE — Progress Notes (Signed)
Cardiology Office Note   Date:  11/27/2021   ID:  Zachary Lewis, DOB [birth year], MRN [id number]  PCP:  Zachary Greenspan, MD  Cardiologist:  Dr. Acie Fredrickson  No chief complaint on file.      History of Present Illness: Zachary Lewis is a 61 y.o. male who is here today for follow-up visit regarding peripheral arterial disease.   He has known history of essential hypertension, type 2 diabetes, hyperlipidemia, liver cirrhosis due to NASH and tobacco use. He is followed for bilateral leg claudication with below the knee disease. He underwent noninvasive vascular evaluation in September 2022 which showed an ABI of 0.81 on the right and 0.72 on the left.  Duplex showed mainly below the knee disease bilaterally.  On the right side, there was one-vessel runoff via the peroneal artery.  On the left side, there was evidence of popliteal/TP trunk aneurysm measuring 1.8 cm with occluded tibial peroneal vessels. 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 He was placed on dual antiplatelet therapy.  He reports stable mild claudication and numbness especially in the left foot.  He is able to walk on the treadmill 45 minutes and he does that every other day.  No rest pain or lower extremity ulceration.  He has not started taking Repatha yet.  He continues to smoke few cigarettes a day.  No chest pain or shortness of breath.  Past Medical History:  Diagnosis Date   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[place]) 08/04/2018   Diabetes mellitus    Type II   Hyperlipidemia    Hypertension    NSAID-induced gastric ulcer 12/13/2018   EGD 11/2018 H pylori neg Omeprazole started   Obesity    Personal history of colonic adenomas 03/09/2013   Tinea pedis    Chronic    Past Surgical History:  Procedure Laterality Date   COLONOSCOPY     COLONOSCOPY W/  POLYPECTOMY  2014   IR PARACENTESIS  07/02/2018   IR TRANSCATHETER BX  07/17/2018   IR VENOGRAM HEPATIC WO HEMODYNAMIC EVALUATION  07/17/2018   myringectomy  06/2005   POLYPECTOMY     UMBILICAL HERNIA REPAIR  01/1997   VASECTOMY  07/2002     Current Outpatient Medications  Medication Sig Dispense Refill   aspirin EC 81 MG tablet Take 1 tablet (81 mg total) by mouth daily. Swallow whole. 90 tablet 3   clopidogrel (PLAVIX) 75 MG tablet Take 1 tablet (75 mg total) by mouth daily. 90 tablet 3   Evolocumab (REPATHA SURECLICK) 378 MG/ML SOAJ Inject 1 pen into the skin every 14 (fourteen) days. 6 mL 3   glucose blood (ONETOUCH VERIO) test strip 1 each by Other route daily. And lancets 1/day 100 each 3   latanoprost (XALATAN) 0.005 % ophthalmic solution SMARTSIG:In Eye(s)     metFORMIN (GLUCOPHAGE-XR) 500 MG 24 hr tablet Take 4 tablets (2,000 mg total) by mouth daily. 360 tablet 3   Semaglutide (RYBELSUS) 3 MG TABS Take 3 mg by mouth daily. 90 tablet 3   spironolactone (ALDACTONE) 50 MG tablet TAKE 1 TABLET(50 MG) BY MOUTH DAILY 90 tablet 0   tadalafil (CIALIS) 20 MG tablet Take 1 tablet (20 mg total) by mouth as needed. 10 tablet 11   thiamine 100 MG tablet Take 1 tablet (100 mg total) by mouth daily. 30 tablet 0  No current facility-administered medications for this visit.    Allergies:   Patient has no known allergies.    Social History:  The patient  reports that he has been smoking cigarettes. He has been smoking an average of .25 packs per day. He has never used smokeless tobacco. He reports that he does not currently use alcohol. He reports that he does not use drugs.   Family History:  The patient's family history includes Hypertension in an other family member.    ROS:  Please see the history of present illness.   Otherwise, review of systems are positive for none.   All other systems are reviewed and negative.    PHYSICAL EXAM: VS:  BP 132/80   Pulse 77   Ht 5' 6"  (1.676 m)    Wt 195 lb 6.4 oz (88.6 kg)   SpO2 99%   BMI 31.54 kg/m  , BMI Body mass index is 31.54 kg/m. GEN: Well nourished, well developed, in no acute distress  HEENT: normal  Neck: no JVD, carotid bruits, or masses Cardiac: RRR; no murmurs, rubs, or gallops,no edema  Respiratory:  clear to auscultation bilaterally, normal work of breathing GI: soft, nontender, nondistended, + BS MS: no deformity or atrophy  Skin: warm and dry, no rash Neuro:  Strength and sensation are intact Psych: euthymic mood, full affect Vascular: Femoral pulses normal bilaterally.  Distal pulses are not palpable.     EKG:  EKG  ordered today. EKG showed normal sinus rhythm with first-degree AV block   Recent Labs: No results found for requested labs within last 365 days.    Lipid Panel    Component Value Date/Time   CHOL 262 (H) 10/31/2020 0738   TRIG 164.0 (H) 10/31/2020 0738   HDL 48.50 10/31/2020 0738   CHOLHDL 5 10/31/2020 0738   VLDL 32.8 10/31/2020 0738   LDLCALC 181 (H) 10/31/2020 0738   LDLDIRECT 176.9 12/08/2012 0807      Wt Readings from Last 3 Encounters:  11/27/21 195 lb 6.4 oz (88.6 kg)  07/24/21 196 lb (88.9 kg)  03/27/21 203 lb (92.1 kg)           No data to display            ASSESSMENT AND PLAN:  1.  Peripheral arterial disease: Bilateral calf and feet claudication due to below the knee disease in the tibial peroneal vessels.  He does have a left popliteal artery aneurysm but the TP trunk and anterior tibial arteries are both occluded proximally likely from embolization.  Thus, risk of further embolization is low and thus there is likely very little benefit of treating the left popliteal artery aneurysm.  Continue dual antiplatelet therapy.  We could consider adding small dose Xarelto in the future. I encouraged him to continue with a walking exercise program.  2.  Tobacco use: He quit smoking but relapsed again.  I discussed with him the importance of smoking  cessation.  3.  Hyperlipidemia: Intolerance to statins.  Most recent lipid profile showed an LDL of 181.  I again had a prolonged discussion with him and his wife about the importance of starting Repatha and he agreed to start this.  He has this medication at home.  We will plan on follow-up lipid and liver profile in the next 6 months.    Disposition:   FU with me in 6 months  Signed,  Kathlyn [place], MD  11/27/2021 8:19 AM    [facility]

## 2021-11-27 ENCOUNTER — Ambulatory Visit (INDEPENDENT_AMBULATORY_CARE_PROVIDER_SITE_OTHER): Payer: No Typology Code available for payment source | Admitting: Cardiovascular Disease

## 2021-11-27 ENCOUNTER — Encounter: Payer: Self-pay | Admitting: Cardiovascular Disease

## 2021-11-27 VITALS — BP 132/80 | HR 77 | Ht 66.0 in | Wt 195.4 lb

## 2021-11-27 DIAGNOSIS — I739 Peripheral vascular disease, unspecified: Secondary | ICD-10-CM | POA: Diagnosis not present

## 2021-11-27 DIAGNOSIS — E785 Hyperlipidemia, unspecified: Secondary | ICD-10-CM

## 2021-11-27 DIAGNOSIS — Z720 Tobacco use: Secondary | ICD-10-CM | POA: Diagnosis not present

## 2021-11-27 NOTE — Patient Instructions (Signed)
Medication Instructions:  No changes *If you need a refill on your cardiac medications before your next appointment, please call your pharmacy*   Lab Work: None ordered If you have labs (blood work) drawn today and your tests are completely normal, you will receive your results only by: [place] (if you have MyChart) OR A paper copy in the mail If you have any lab test that is abnormal or we need to change your treatment, we will call you to review the results.   Testing/Procedures: None ordered   Follow-Up: At [facility], you and your health needs are our priority.  As part of our continuing mission to provide you with exceptional heart care, we have created designated Provider Care Teams.  These Care Teams include your primary Cardiologist (physician) and Advanced Practice Providers (APPs -  Physician Assistants and Nurse Practitioners) who all work together to provide you with the care you need, when you need it.  We recommend signing up for the patient portal called "MyChart".  Sign up information is provided on this After Visit Summary.  MyChart is used to connect with patients for Virtual Visits (Telemedicine).  Patients are able to view lab/test results, encounter notes, upcoming appointments, etc.  Non-urgent messages can be sent to your provider as well.   To learn more about what you can do with MyChart, go to [email or web address].    Your next appointment:   6 month(s)  The format for your next appointment:   In Person  Provider:   Dr. Fletcher Anon  Other Instructions [place] (PAD)   General Information:   Research in vascular exercise has demonstrated remarkable improvement in symptoms of leg pain (claudication) without expensive or invasive interventions. Regular walking programs are extremely helpful for patients with PAD and intermittent claudication.  These steps are designed to help you get  started with a safe and effective program to help you walk farther with less pain:   Walk at least three times a week (preferably every day).  Your goal is to build up to 30-45 minutes of total walking time (not counting rest breaks). It may take you several weeks to build up your exercise time starting at 5-10 minutes or whatever you can tolerate.  Walk as far as possible using moderate to maximal pain (7-8 on the scale below) as a signal to stop, and resume walking when the pain goes away.  On a treadmill, set the speed and grade at a level that brings on the claudication pain within 3 to 5 minutes. Walk at this rate until you experience claudication of moderate severity, rest until the pain improves, and then resume walking.  Over time, you will be able to walk longer at the designated speed and grade; workload should then be increased until you develop the pain within 3 to 5 minutes once again.  This regimen will induce a significant benefit. Studies have demonstrated that participants may be able to walk up to three or four times farther and have less leg pain, within twelve weeks, by following this protocol.  Pain Scale    0_____1_____2_____3_____4_____5_____6_____7_____8_____9_____10   No Pain                                   Moderate Pain  Maximal Pain   Managing the Challenge of Quitting Smoking Quitting smoking is a physical and mental challenge. You may have cravings, withdrawal symptoms, and temptation to smoke. Before quitting, work with your health care provider to make a plan that can help you manage quitting. Making a plan before you quit may keep you from smoking when you have the urge to smoke while trying to quit. How to manage lifestyle changes Managing stress Stress can make you want to smoke, and wanting to smoke may cause stress. It is important to find ways to manage your stress. You could try some of the following: Practice relaxation  techniques. Breathe slowly and deeply, in through your nose and out through your mouth. Listen to music. Soak in a bath or take a shower. Imagine a peaceful place or vacation. Get some support. Talk with family or friends about your stress. Join a support group. Talk with a counselor or therapist. Get some physical activity. Go for a walk, run, or bike ride. Play a favorite sport. Practice yoga.  Medicines Talk with your health care provider about medicines that might help you deal with cravings and make quitting easier for you. Relationships Social situations can be difficult when you are quitting smoking. To manage this, you can: Avoid parties and other social situations where people might be smoking. Avoid alcohol. Leave right away if you have the urge to smoke. Explain to your family and friends that you are quitting smoking. Ask for support and let them know you might be a bit grumpy. Plan activities where smoking is not an option. General instructions Be aware that many people gain weight after they quit smoking. However, not everyone does. To keep from gaining weight, have a plan in place before you quit, and stick to the plan after you quit. Your plan should include: Eating healthy snacks. When you have a craving, it may help to: Eat popcorn, or try carrots, celery, or other cut vegetables. Chew sugar-free gum. Changing how you eat. Eat small portion sizes at meals. Eat 4-6 small meals throughout the day instead of 1-2 large meals a day. Be mindful when you eat. You should avoid watching television or doing other things that might distract you as you eat. Exercising regularly. Make time to exercise each day. If you do not have time for a long workout, do short bouts of exercise for 5-10 minutes several times a day. Do some form of strengthening exercise, such as weight lifting. Do some exercise that gets your heart beating and causes you to breathe deeply, such as walking  fast, running, swimming, or biking. This is very important. Drinking plenty of water or other low-calorie or no-calorie drinks. Drink enough fluid to keep your urine pale yellow.  How to recognize withdrawal symptoms Your body and mind may experience discomfort as you try to get used to not having nicotine in your system. These effects are called withdrawal symptoms. They may include: Feeling hungrier than normal. Having trouble concentrating. Feeling irritable or restless. Having trouble sleeping. Feeling depressed. Craving a cigarette. These symptoms may surprise you, but they are normal to have when quitting smoking. To manage withdrawal symptoms: Avoid places, people, and activities that trigger your cravings. Remember why you want to quit. Get plenty of sleep. Avoid coffee and other drinks that contain caffeine. These may worsen some of your symptoms. How to manage cravings Come up with a plan for how to deal with your cravings. The plan should include the following: A  definition of the specific situation you want to deal with. An activity or action you will take to replace smoking. A clear idea for how this action will help. The name of someone who could help you with this. Cravings usually last for 5-10 minutes. Consider taking the following actions to help you with your plan to deal with cravings: Keep your mouth busy. Chew sugar-free gum. Suck on hard candies or a straw. Brush your teeth. Keep your hands and body busy. Change to a different activity right away. Squeeze or play with a ball. Do an activity or a hobby, such as making bead jewelry, practicing needlepoint, or working with wood. Mix up your normal routine. Take a short exercise break. Go for a quick walk, or run up and down stairs. Focus on doing something kind or helpful for someone else. Call a friend or family member to talk during a craving. Join a support group. Contact a quitline. Where to find  support To get help or find a support group: Call the [facility]'s Smoking Quitline: 1-800-QUIT-NOW 484-219-1508) Text QUIT to SmokefreeTXT: 798921 Where to find more information Visit these websites to find more information on quitting smoking: U.S. Department of Health and Human Services: www.smokefree.gov American Lung Association: www.freedomfromsmoking.org Centers for Disease Control and Prevention (CDC): http://www.wolf.info/ American Heart Association: www.heart.org Contact a health care provider if: You want to change your plan for quitting. The medicines you are taking are not helping. Your eating feels out of control or you cannot sleep. You feel depressed or become very anxious. Summary Quitting smoking is a physical and mental challenge. You will face cravings, withdrawal symptoms, and temptation to smoke again. Preparation can help you as you go through these challenges. Try different techniques to manage stress, handle social situations, and prevent weight gain. You can deal with cravings by keeping your mouth busy (such as by chewing gum), keeping your hands and body busy, calling family or friends, or contacting a quitline for people who want to quit smoking. You can deal with withdrawal symptoms by avoiding places where people smoke, getting plenty of rest, and avoiding drinks that contain caffeine. This information is not intended to replace advice given to you by your health care provider. Make sure you discuss any questions you have with your health care provider. Document Revised: 04/27/2021 Document Reviewed: 04/27/2021 Elsevier Patient Education  [place] [facility].

## 2021-12-25 IMAGING — CT CT ANGIO AOBIFEM WO/W CM
1 of 10 series · 3 of 16 positions shown, 4 images · IV contrast (iopamidol)
Comparison: None.

CLINICAL DATA: Lower extremity pain. Concern for PAD. history of
smoking, hypertension and diabetes.

EXAM:
CT ANGIOGRAPHY OF ABDOMINAL AORTA WITH ILIOFEMORAL RUNOFF
TECHNIQUE: Multidetector CT imaging of the abdomen, pelvis and lower
extremities was performed using the standard protocol during bolus
administration of intravenous contrast. Multiplanar CT image
reconstructions and MIPs were obtained to evaluate the vascular
anatomy.
CONTRAST:  100mL I9BRBJ-MMM IOPAMIDOL (I9BRBJ-MMM) INJECTION 61%

[Series 6: cta whole body 2.00 bv36 s3 axial 2 mm · axial · 0.75mm/px · z∈[+757,+1413]mm · 3 of 658 slices shown, 4 images]
[im 165/658  soft-tissue]
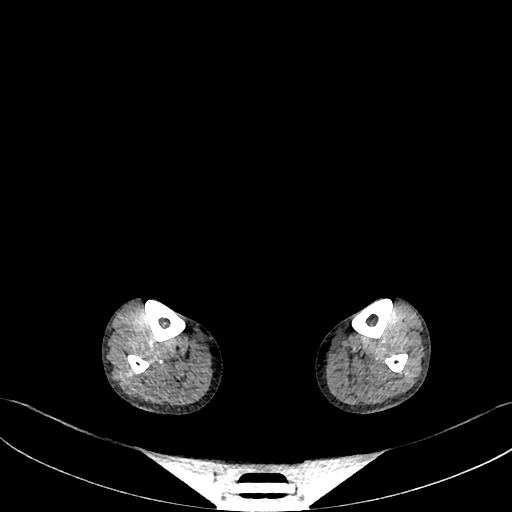
[im 165/658  bone]
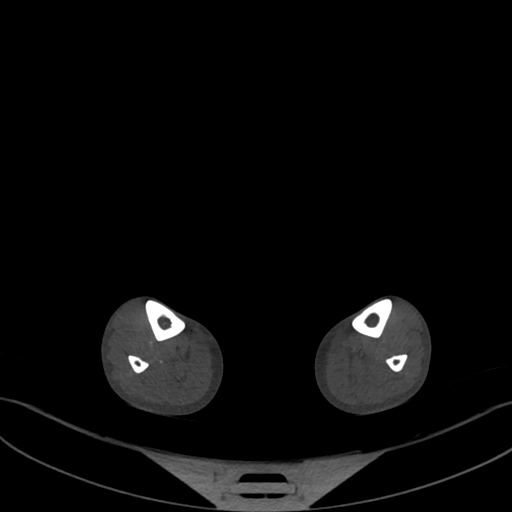
[im 329/658  soft-tissue]
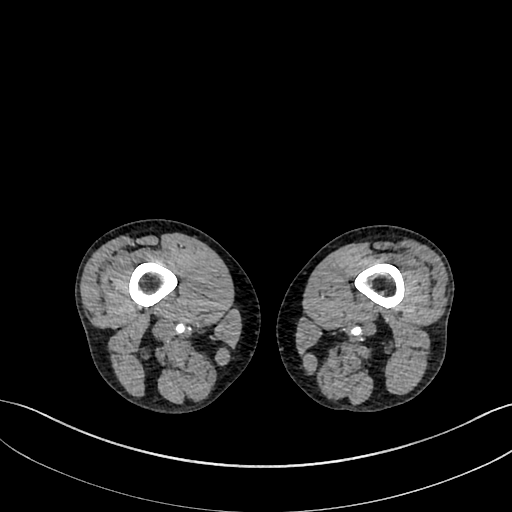
[im 493/658  soft-tissue]
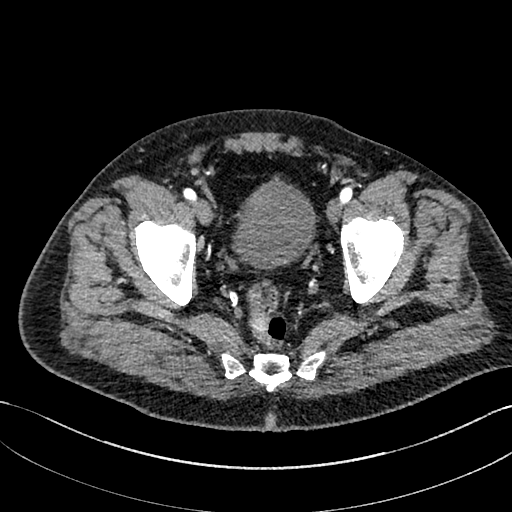

[3 of 16 positions shown; findings below may reference images not displayed]

FINDINGS: VASCULAR

Aorta: Moderate amount of slightly irregular mixed calcified and
noncalcified atherosclerotic plaque within normal caliber abdominal
aorta, not resulting in a hemodynamically significant stenosis. No
evidence of abdominal aortic dissection or perivascular stranding.

Celiac: Widely patent without hemodynamically significant narrowing.
Conventional branching pattern.

SMA: There is a minimal amount of mixed calcified and noncalcified
atherosclerotic plaque involving the origin and main trunk of the
SMA, not resulting in a hemodynamically significant stenosis.
Conventional branching pattern. The distal tributaries of the SMA
appear widely patent without discrete intraluminal filling defects
to suggest distal embolism.

Renals: Solitary bilaterally; there is a minimal amount of eccentric
mixed calcified and noncalcified atherosclerotic plaque involving
the origin and proximal aspects of the bilateral renal arteries,
left greater than right, not resulting in hemodynamically
significant stenosis. No definitive evidence of vessel irregularity
to suggest FMD.

IMA: Diseased at its origin though remains patent with collateral
supply from the SMA.

_________________________________________________________

RIGHT Lower Extremity

Inflow: There is a moderate amount of mixed calcified and
noncalcified atherosclerotic plaque involving the right common iliac
artery, not resulting in hemodynamically significant stenosis. The
right internal iliac artery is diseased though patent and of normal
caliber. The right external iliac artery is diseased proximally
though without a definitive hemodynamically significant narrowing.

Outflow: There is a moderate amount of mixed calcified and
noncalcified atherosclerotic plaque involving the right common
femoral artery approaching 50% luminal narrowing (image 184, series
6). The right deep femoral artery is disease though remains patent.

There is a minimal to moderate amount of mixed calcified and
noncalcified atherosclerotic plaque involving the proximal aspect
the right superficial femoral artery, not resulting in
hemodynamically significant stenosis. There is a moderate amount of
eccentric mixed calcified and noncalcified atherosclerotic plaque
involving the distal aspect of the right superficial femoral artery,
not definitely resulting in a hemodynamically significant stenosis.

There is a large amount of focal eccentric calcified atherosclerotic
plaque involving the intra-articular segment of the right popliteal
artery resulting in short segment approximately 70% luminal
narrowing (image 387, series 6). The right below-knee popliteal
artery is of normal caliber and without hemodynamically significant
narrowing.

Runoff: There is occlusion of the right posterior tibial trunk at
its origin. The right posterior tibial artery is occluded throughout
its course. The right anterior tibial artery is patent proximally
though eventually occludes at the level of the proximal/mid tibia
(image 481, series 6). The right peroneal artery is reconstituted
proximally via right infra genicular arterial collaterals and serves
as the predominant arterial supply to the right foot, reconstituting
the anterior tibial artery proximal to the ankle mortise. The
right-sided dorsalis pedis artery is patent to the level of the
forefoot. No discrete intraluminal filling defects to suggest distal
embolism.

_________________________________________________________

LEFT Lower Extremity

Inflow: There is a moderate amount of mixed calcified and
noncalcified atherosclerotic plaque involving the left common iliac
artery, not resulting in a hemodynamically significant stenosis. The
left internal iliac artery is diseased though patent and of normal
caliber. The left external iliac artery is diseased proximally
though without a definitive hemodynamically significant narrowing.

Outflow: There is a minimal amount of eccentric mixed calcified and
noncalcified atherosclerotic plaque involving the left common
femoral artery, not resulting in a hemodynamically significant
stenosis. The left deep femoral artery is diseased though remains
patent.

There is a moderate amount of mixed calcified and noncalcified
atherosclerotic plaque involving the proximal (image 231, series 6)
and distal (image 312, series 6) aspects of the left superficial
femoral artery which results in approximately 50% luminal narrowing
of both of these locations.

There is aneurysmal dilatation involving the intra-articular segment
of the left popliteal artery measuring approximately 1.6 cm in
diameter (axial image 386, series 6), extending for a length of
approximately 2.6 cm (coronal image 95, series 12). There is a large
amount of crescentic predominantly noncalcified mural thrombus
within the dominant component of the aneurysm, not definitively
resulting in a hemodynamically significant stenosis.

There is a moderate to large amount of eccentric mixed calcified and
noncalcified atherosclerotic plaque involving the left below-knee
popliteal artery which approaches approximately 50% luminal
narrowing (image 406, series 6).

Runoff: The tibioperoneal trunk is occluded at its origin. The left
posterior tibial artery is occluded throughout its course. The left
anterior tibial artery occludes proximally (representative image
446, series 6), however there is faint atretic reconstitution of
both the peroneal and anterior tibial arteries, ultimately with
preserved arterial supply and reconstitution of the dorsalis pedis
artery which appears patent to the level of the midfoot. No discrete
intraluminal filling defects to suggest distal embolism.

Veins: The IVC and pelvic venous systems appear patent on this
arterial phase examination.

Review of the MIP images confirms the above findings.

_________________________________________________________

_________________________________________________________

NON-VASCULAR

Lower chest: Limited visualization of the lower thorax demonstrates
minimal dependent subpleural ground-glass atelectasis. No discrete
focal airspace opacities. No pleural effusion.

Borderline cardiomegaly.  No pericardial effusion.

Hepatobiliary: Nodularity of the hepatic contour suggestive of
hepatic cirrhosis. No discrete hyperenhancing hepatic lesions.
Normal appearance of the gallbladder given degree distention. No
radiopaque gallstones. No intra or extrahepatic biliary duct
dilatation. No ascites.

Pancreas: Normal early arterial phase appearance of the pancreas.

Spleen: Normal early arterial phase appearance of the spleen.

Adrenals/Urinary Tract: There is symmetric enhancement of the
bilateral kidneys. No evidence of nephrolithiasis on this
postcontrast examination. No discrete renal lesions. No urinary
obstruction or perinephric stranding.

There is mild thickening of the crux of the left adrenal gland
without discrete nodule. Normal appearance of the right adrenal
gland.

Normal appearance of the urinary bladder given degree of distention.

Stomach/Bowel: Moderate colonic stool burden without evidence of
enteric obstruction. Normal appearance of the terminal ileum and the
appendix. No discrete areas of bowel wall thickening. No significant
hiatal hernia. No pneumoperitoneum, pneumatosis or portal venous
gas.

Lymphatic: Scattered porta hepatis and upper abdominal
retroperitoneal lymph nodes are prominent and numerous though
individually not enlarged by size criteria, presumably reactive in
etiology in the setting of suspected hepatic cirrhosis with index
porta hepatis lymph node measuring 1.2 cm in greatest short axis
diameter (image 49, series 6), index precaval lymph node measuring
0.9 cm (image 59, series 6) and index aortocaval lymph node
measuring 0.8 cm (image 73, series 6).

Reproductive: Normal appearance the prostate gland. No free fluid
within the pelvic cul-de-sac.

Other: Small mesenteric fat containing left-sided direct inguinal
hernia. Minimal amount of subcutaneous edema about the midline of
the low back.

Musculoskeletal: No acute or aggressive osseous abnormalities. Mild
degenerative change the bilateral hips with joint space loss,
subchondral sclerosis and osteophytosis. Mild multilevel lumbar
spine DDD, worse at L5-S1 with disc space height loss, endplate
irregularity and sclerosis. Stigmata of dish within the lower
thoracic spine.
IMPRESSION: VASCULAR

1. Moderate amount of atherosclerotic plaque within a normal caliber
abdominal aorta, not resulting in a hemodynamically significant
stenosis. Aortic Atherosclerosis ([id number]).

RIGHT LOWER EXTREMITY VASCULAR IMPRESSION:

1. Approximately 50% luminal narrowing involving the right common
femoral artery.
2. Calcified atherosclerotic plaque results in focal approximately
70% luminal narrowing of the intra-articular segment of the right
popliteal artery.
3. Atretic runoff to the right lower leg with occlusion of the
tibioperoneal trunk with reconstitution of the peroneal artery via
infrageniculate collaterals. The right anterior tibial artery
occludes at the level of the proximal/mid tibia however is
reconstituted via the peroneal artery at the level of the ankle
mortise. The right-sided dorsalis pedis artery is patent to the
level of the forefoot. No discrete intraluminal filling defects to
suggest distal embolism.

LEFT LOWER EXTREMITY VASCULAR IMPRESSION:

1. Moderate amount of mixed calcified and noncalcified
atherosclerotic plaque results in approximately 50% luminal
narrowing involving the proximal and distal aspects of the left
superficial femoral artery.
2. Fusiform aneurysmal dilatation of the intra-articular segment of
the left popliteal artery measuring 1.6 cm in diameter extending for
a length of approximately 2.6 cm. There is a large amount of
crescentic predominantly noncalcified mural thrombus within the
dominant component of the aneurysm, not definitively resulting in a
hemodynamically significant stenosis.
3. Moderate to large amount of eccentric mixed calcified and
noncalcified atherosclerotic plaque results in approximately 50%
luminal narrowing of the left below-knee popliteal artery.
4. Extremely atretic arterial supply to the left lower leg with
occlusion of the tibioperoneal trunk at its origin and early
occlusion of the left anterior tibial artery. There is atretic,
staggered reconstitution of both the left anterior tibial and
peroneal arteries which contribute arterial supply to the dorsalis
pedis artery which remains patent to the level of the midfoot. No
discrete lumen filling defects to suggest distal embolism.

NON-VASCULAR

1. Suspected hepatic cirrhosis. No discrete hyperenhancing hepatic
lesions to suggest hepatocellular carcinoma. Further evaluation with
nonemergent abdominal MRI could be performed as indicated.
2. Prominent though non pathologically enlarged porta hepatis and
retroperitoneal lymph nodes, likely reactive in the setting of
suspected hepatic cirrhosis.

## 2022-01-02 ENCOUNTER — Other Ambulatory Visit (HOSPITAL_COMMUNITY): Payer: Self-pay

## 2022-01-02 MED ORDER — TADALAFIL 20 MG PO TABS
20.0000 mg | ORAL_TABLET | Freq: Every day | ORAL | 2 refills | Status: DC | PRN
  Filled 2022-01-02: qty 60, 60d supply, fill #0
  Filled 2022-01-11: qty 89, 89d supply, fill #0
  Filled 2022-01-11: qty 1, 1d supply, fill #1
  Filled 2022-06-07 – 2022-06-10 (×2): qty 82, 82d supply, fill #2
  Filled 2022-06-10: qty 60, 60d supply, fill #2
  Filled 2022-10-17: qty 60, 60d supply, fill #3

## 2022-01-10 ENCOUNTER — Other Ambulatory Visit (HOSPITAL_COMMUNITY): Payer: Self-pay

## 2022-01-11 ENCOUNTER — Other Ambulatory Visit (HOSPITAL_COMMUNITY): Payer: Self-pay

## 2022-01-22 ENCOUNTER — Other Ambulatory Visit: Payer: Self-pay | Admitting: Family Medicine

## 2022-01-23 MED ORDER — SPIRONOLACTONE 50 MG PO TABS: 50.0000 mg | ORAL_TABLET | Freq: Every day | ORAL | 0 refills | Status: DC

## 2022-01-23 NOTE — Telephone Encounter (Signed)
Patient scheduled.

## 2022-01-23 NOTE — Telephone Encounter (Signed)
Last OV was 11/07/20, please schedule a CPE (labs prior) or at least a med refill and then route back to me to fill med once appt has been made. thanks

## 2022-01-31 ENCOUNTER — Other Ambulatory Visit (HOSPITAL_COMMUNITY): Payer: Self-pay

## 2022-01-31 ENCOUNTER — Telehealth: Payer: Self-pay | Admitting: Family Medicine

## 2022-01-31 DIAGNOSIS — K7460 Unspecified cirrhosis of liver: Secondary | ICD-10-CM

## 2022-01-31 DIAGNOSIS — R7989 Other specified abnormal findings of blood chemistry: Secondary | ICD-10-CM

## 2022-01-31 DIAGNOSIS — E785 Hyperlipidemia, unspecified: Secondary | ICD-10-CM

## 2022-01-31 DIAGNOSIS — E1169 Type 2 diabetes mellitus with other specified complication: Secondary | ICD-10-CM

## 2022-01-31 DIAGNOSIS — I10 Essential (primary) hypertension: Secondary | ICD-10-CM

## 2022-01-31 DIAGNOSIS — Z125 Encounter for screening for malignant neoplasm of prostate: Secondary | ICD-10-CM

## 2022-01-31 NOTE — Telephone Encounter (Signed)
-----   Message from Ellamae Sia sent at 01/25/2022  4:12 PM EDT ----- Regarding: Lab orders for Friday, 9.15.23 Patient is scheduled for CPX labs, please order future labs, Thanks , Karna Christmas

## 2022-02-01 ENCOUNTER — Other Ambulatory Visit (INDEPENDENT_AMBULATORY_CARE_PROVIDER_SITE_OTHER): Payer: No Typology Code available for payment source

## 2022-02-01 DIAGNOSIS — E1169 Type 2 diabetes mellitus with other specified complication: Secondary | ICD-10-CM

## 2022-02-01 DIAGNOSIS — K7460 Unspecified cirrhosis of liver: Secondary | ICD-10-CM

## 2022-02-01 DIAGNOSIS — I10 Essential (primary) hypertension: Secondary | ICD-10-CM

## 2022-02-01 DIAGNOSIS — E785 Hyperlipidemia, unspecified: Secondary | ICD-10-CM | POA: Diagnosis not present

## 2022-02-01 DIAGNOSIS — Z125 Encounter for screening for malignant neoplasm of prostate: Secondary | ICD-10-CM | POA: Diagnosis not present

## 2022-02-01 DIAGNOSIS — R7989 Other specified abnormal findings of blood chemistry: Secondary | ICD-10-CM

## 2022-02-01 LAB — LIPID PANEL
Cholesterol: 148 mg/dL (ref 0–200)
HDL: 64.4 mg/dL (ref >39.00)
LDL Cholesterol: 63 mg/dL (ref 0–99)
NonHDL: 83.5
Total CHOL/HDL Ratio: 2
Triglycerides: 104 mg/dL (ref 0.0–149.0)
VLDL: 20.8 mg/dL (ref 0.0–40.0)

## 2022-02-01 LAB — CBC WITH DIFFERENTIAL/PLATELET
Basophils Absolute: 0.1 10*3/uL (ref 0.0–0.1)
Basophils Relative: 1.8 % (ref 0.0–3.0)
Eosinophils Absolute: 0.6 10*3/uL (ref 0.0–0.7)
Eosinophils Relative: 11.4 % — ABNORMAL HIGH (ref 0.0–5.0)
HCT: 45.9 % (ref 39.0–52.0)
Hemoglobin: 15.5 g/dL (ref 13.0–17.0)
Lymphocytes Relative: 29.2 % (ref 12.0–46.0)
Lymphs Abs: 1.5 10*3/uL (ref 0.7–4.0)
MCHC: 33.8 g/dL (ref 30.0–36.0)
MCV: 100.9 fl — ABNORMAL HIGH (ref 78.0–100.0)
Monocytes Absolute: 0.5 10*3/uL (ref 0.1–1.0)
Monocytes Relative: 10.1 % (ref 3.0–12.0)
Neutro Abs: 2.4 10*3/uL (ref 1.4–7.7)
Neutrophils Relative %: 47.5 % (ref 43.0–77.0)
Platelets: 235 10*3/uL (ref 150.0–400.0)
RBC: 4.55 Mil/uL (ref 4.22–5.81)
RDW: 13.5 % (ref 11.5–15.5)
WBC: 5 10*3/uL (ref 4.0–10.5)

## 2022-02-01 LAB — PSA: PSA: 0.37 ng/mL (ref 0.10–4.00)

## 2022-02-01 LAB — COMPREHENSIVE METABOLIC PANEL
ALT: 48 U/L (ref 0–53)
AST: 32 U/L (ref 0–37)
Albumin: 4.7 g/dL (ref 3.5–5.2)
Alkaline Phosphatase: 159 U/L — ABNORMAL HIGH (ref 39–117)
BUN: 19 mg/dL (ref 6–23)
CO2: 21 mEq/L (ref 19–32)
Calcium: 10.3 mg/dL (ref 8.4–10.5)
Chloride: 99 mEq/L (ref 96–112)
Creatinine, Ser: 0.89 mg/dL (ref 0.40–1.50)
GFR: 91.89 mL/min (ref >60.00)
Glucose, Bld: 268 mg/dL — ABNORMAL HIGH (ref 70–99)
Potassium: 4.7 mEq/L (ref 3.5–5.1)
Sodium: 136 mEq/L (ref 135–145)
Total Bilirubin: 0.5 mg/dL (ref 0.2–1.2)
Total Protein: 8.2 g/dL (ref 6.0–8.3)

## 2022-02-01 LAB — TSH: TSH: 3.35 u[IU]/mL (ref 0.35–5.50)

## 2022-02-01 LAB — HEMOGLOBIN A1C: Hgb A1c MFr Bld: 9.7 % — ABNORMAL HIGH (ref 4.6–6.5)

## 2022-02-01 LAB — T4, FREE: Free T4: 0.74 ng/dL (ref 0.60–1.60)

## 2022-02-04 ENCOUNTER — Encounter: Payer: Self-pay | Admitting: Internal Medicine

## 2022-02-08 ENCOUNTER — Ambulatory Visit (INDEPENDENT_AMBULATORY_CARE_PROVIDER_SITE_OTHER): Payer: No Typology Code available for payment source | Admitting: Family Medicine

## 2022-02-08 ENCOUNTER — Encounter: Payer: Self-pay | Admitting: Family Medicine

## 2022-02-08 VITALS — BP 122/80 | HR 94 | Temp 97.2°F | Ht 65.0 in | Wt 192.6 lb

## 2022-02-08 DIAGNOSIS — Z0000 Encounter for general adult medical examination without abnormal findings: Secondary | ICD-10-CM | POA: Diagnosis not present

## 2022-02-08 DIAGNOSIS — I10 Essential (primary) hypertension: Secondary | ICD-10-CM

## 2022-02-08 DIAGNOSIS — E785 Hyperlipidemia, unspecified: Secondary | ICD-10-CM

## 2022-02-08 DIAGNOSIS — K766 Portal hypertension: Secondary | ICD-10-CM | POA: Diagnosis not present

## 2022-02-08 DIAGNOSIS — E1169 Type 2 diabetes mellitus with other specified complication: Secondary | ICD-10-CM

## 2022-02-08 DIAGNOSIS — F17200 Nicotine dependence, unspecified, uncomplicated: Secondary | ICD-10-CM

## 2022-02-08 DIAGNOSIS — R7989 Other specified abnormal findings of blood chemistry: Secondary | ICD-10-CM

## 2022-02-08 DIAGNOSIS — Z23 Encounter for immunization: Secondary | ICD-10-CM

## 2022-02-08 DIAGNOSIS — Z125 Encounter for screening for malignant neoplasm of prostate: Secondary | ICD-10-CM

## 2022-02-08 DIAGNOSIS — Z86010 Personal history of colonic polyps: Secondary | ICD-10-CM

## 2022-02-08 DIAGNOSIS — K709 Alcoholic liver disease, unspecified: Secondary | ICD-10-CM | POA: Diagnosis not present

## 2022-02-08 MED ORDER — SPIRONOLACTONE 50 MG PO TABS: 50.0000 mg | ORAL_TABLET | Freq: Every day | ORAL | 3 refills | Status: DC

## 2022-02-08 NOTE — Progress Notes (Unsigned)
Subjective:    Patient ID: Zachary Lewis, male    DOB: [birth year], 61 y.o.   MRN: [id number]  HPI Here for health maintenance exam and to review chronic medical problems   Wt Readings from Last 3 Encounters:  02/08/22 192 lb 9.6 oz (87.4 kg)  11/27/21 195 lb 6.4 oz (88.6 kg)  07/24/21 196 lb (88.9 kg)   32.05 kg/m  Eating well  Exercising 5 d per week   Working Feeling fine     Immunization History  Administered Date(s) Administered   Influenza Split 02/17/2012   Influenza Whole 02/03/2008   Influenza,inj,Quad PF,6+ Mos 02/04/2014, 06/05/2015, 04/17/2016, 05/26/2017, 05/29/2018, 02/15/2019   Pneumococcal Polysaccharide-23 02/10/2006, 10/30/2011   Td 01/18/1998, 04/29/2008   Tdap 11/05/2018   Health Maintenance Due  Topic Date Due   COVID-19 Vaccine (1) Never done   Zoster Vaccines- Shingrix (1 of 2) Never done   Diabetic kidney evaluation - Urine ACR  02/15/2020   OPHTHALMOLOGY EXAM  07/21/2020   COLONOSCOPY (Pts 45-35yr Insurance coverage will need to be confirmed)  12/07/2021   INFLUENZA VACCINE  12/18/2021   FOOT EXAM  01/26/2022    Flu shot -today   Pna vaccine was 10/2011   Shingrix-found out is is covered   Eye exam- was this spring / no retinopathy   Colonoscopy is due (3 y recall from 11/2018) Ready to schedule it   Smoking status : smokes about 10 cig per week   Quit date is last day in sept  Cannot smoke in house or car -has to sneak them  Feels better overall    No etoh at all   Prostate health  Lab Results  Component Value Date   PSA 0.37 02/01/2022   PSA 0.20 10/31/2020   PSA 0.20 10/29/2018  Nocturia is once maximum No trouble with stream  No prostate cancer in family    HTN BP Readings from Last 3 Encounters:  02/08/22 122/80  11/27/21 132/80  07/24/21 120/80    Pulse Readings from Last 3 Encounters:  02/08/22 94  11/27/21 77  07/24/21 77     Aldactone 50 mg daily   Sees cardiology for PAD in [facility]   Legs are  not as sore as they were    Takes asa 81 mg daily  Plavix 75 mg daily   Lab Results  Component Value Date   CREATININE 0.89 02/01/2022   BUN 19 02/01/2022   NA 136 02/01/2022   K 4.7 02/01/2022   CL 99 02/01/2022   CO2 21 02/01/2022  GFR 940.8  Alcoholic liver dz with portal hypertension   Lab Results  Component Value Date   ALT 48 02/01/2022   AST 32 02/01/2022   ALKPHOS 159 (H) 02/01/2022   BILITOT 0.5 02/01/2022     DM2 Lab Results  Component Value Date   HGBA1C 9.7 (H) 02/01/2022   Endocrinology care   Does not take metformin/ diarrhea  -is on semaglutide oral  Appetite is lower  Does not check glucose often ? What it is running   Hyperlipidemia Lab Results  Component Value Date   CHOL 148 02/01/2022   CHOL 262 (H) 10/31/2020   CHOL 252 (H) 05/24/2019   Lab Results  Component Value Date   HDL 64.40 02/01/2022   HDL 48.50 10/31/2020   HDL 64.50 05/24/2019   Lab Results  Component Value Date   LDLCALC 63 02/01/2022   LDLCALC 181 (H) 10/31/2020   LDLCALC 163 (H) 05/24/2019  Lab Results  Component Value Date   TRIG 104.0 02/01/2022   TRIG 164.0 (H) 10/31/2020   TRIG 123.0 05/24/2019   Lab Results  Component Value Date   CHOLHDL 2 02/01/2022   CHOLHDL 5 10/31/2020   CHOLHDL 4 05/24/2019   Lab Results  Component Value Date   LDLDIRECT 176.9 12/08/2012   LDLDIRECT 195.5 02/17/2012   LDLDIRECT 178.4 10/30/2011   No fried food  No red meats   Eats baked/grilled chicken and fish  Veggies/salads    The 10-year ASCVD risk score (Arnett DK, et al., 2019) is: 41.2%   Values used to calculate the score:     Age: 89 years     Sex: Male     Is Non-Hispanic African American: Yes     Diabetic: Yes     Tobacco smoker: Yes     Systolic Blood Pressure: 850 mmHg     Is BP treated: Yes     HDL Cholesterol: 64.4 mg/dL     Total Cholesterol: 148 mg/dL  No statin due to etoh liver dz  Taking repatha and LDL is under 70 Tolerates it well     H/o elevated tsh Lab Results  Component Value Date   TSH 3.35 02/01/2022   Lab Results  Component Value Date   WBC 5.0 02/01/2022   HGB 15.5 02/01/2022   HCT 45.9 02/01/2022   MCV 100.9 (H) 02/01/2022   PLT 235.0 02/01/2022   Patient Active Problem List   Diagnosis Date Noted   Routine general medical examination at a health care facility 02/10/2022   Elevated TSH 11/07/2020   Erectile dysfunction 01/07/2020   Portal hypertension ([place]) 06/24/2019   Low back pain 12/29/2018   NSAID-induced gastric ulcer 12/13/2018   Pain due to onychomycosis of toenails of both feet 12/01/2018   Pre-ulcerative calluses 12/01/2018   Porokeratosis 12/01/2018   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[place]) 08/04/2018   History of alcoholic hepatitis    Alcoholic liver disease ([place])    Smoking 01/04/2014   Hx of adenomatous colonic polyps 03/09/2013   Prostate cancer screening 12/07/2012   Encounter for general adult medical examination with abnormal findings 08/23/2010   Obesity    HYPERTENSION, BENIGN ESSENTIAL 01/05/2010   BACK PAIN, LUMBAR, WITH RADICULOPATHY 09/13/2008   TINEA PEDIS 07/16/2007   DM (diabetes mellitus), type 2 ([place]) 08/13/2006   Hyperlipidemia associated with type 2 diabetes mellitus ([place]) 08/13/2006   ALLERGY 08/13/2006   Fatty liver 08/13/2006   Past Medical History:  Diagnosis Date   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[place]) 08/04/2018   Diabetes mellitus    Type II   Hyperlipidemia    Hypertension    NSAID-induced gastric ulcer 12/13/2018   EGD 11/2018 H pylori neg Omeprazole started   Obesity    Personal history of colonic adenomas 03/09/2013   Tinea pedis    Chronic   Past Surgical History:  Procedure Laterality Date   COLONOSCOPY     COLONOSCOPY W/ POLYPECTOMY  2014   IR PARACENTESIS  07/02/2018   IR TRANSCATHETER BX  07/17/2018   IR VENOGRAM HEPATIC WO HEMODYNAMIC EVALUATION  07/17/2018   myringectomy  06/2005    POLYPECTOMY     UMBILICAL HERNIA REPAIR  01/1997   VASECTOMY  07/2002   Social History   Tobacco Use   Smoking status: Light Smoker    Packs/day: 0.25    Types: Cigarettes   Smokeless tobacco: Never  Vaping Use   Vaping Use: Never used  Substance Use Topics   Alcohol use: Not Currently    Comment: 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 Swallow whole. 90 tablet 3   clopidogrel (PLAVIX) 75 MG tablet Take 1 tablet (75 mg total) by mouth daily. 90 tablet 3   Evolocumab (REPATHA SURECLICK) 086 MG/ML SOAJ Inject 1 pen into the skin every 14 (fourteen) days. 6 mL 3   glucose blood (ONETOUCH VERIO) test strip 1 each by Other route daily. And lancets 1/day 100 each 3   latanoprost (XALATAN) 0.005 % ophthalmic solution SMARTSIG:In Eye(s)     Semaglutide (RYBELSUS) 3 MG TABS Take 3 mg by mouth daily. 90 tablet 3   tadalafil (CIALIS) 20 MG tablet Take 1 tablet (20 mg total) by mouth as needed. 10 tablet 11   tadalafil (CIALIS) 20 MG tablet Take 1 tablet (20 mg total) by mouth daily as needed. 90 tablet 2   thiamine 100 MG tablet Take 1 tablet (100 mg total) by mouth daily. 30 tablet 0   No current facility-administered medications on file prior to visit.      Review of Systems  Constitutional:  Negative for activity change, appetite change, fatigue, fever and unexpected weight change.  HENT:  Negative for congestion, rhinorrhea, sore throat and trouble swallowing.   Eyes:  Negative for pain,  redness, itching and visual disturbance.  Respiratory:  Negative for cough, chest tightness, shortness of breath and wheezing.   Cardiovascular:  Negative for chest pain and palpitations.  Gastrointestinal:  Negative for abdominal pain, blood in stool, constipation, diarrhea and nausea.  Endocrine: Negative for cold intolerance, heat intolerance, polydipsia and polyuria.  Genitourinary:  Negative for difficulty urinating, dysuria, frequency and urgency.  Musculoskeletal:  Negative for arthralgias, joint swelling and myalgias.  Skin:  Negative for pallor and rash.  Neurological:  Negative for dizziness, tremors, weakness, numbness and headaches.  Hematological:  Negative for adenopathy. Does not bruise/bleed easily.  Psychiatric/Behavioral:  Negative for decreased concentration and dysphoric mood. The patient is not nervous/anxious.        Objective:   Physical Exam Constitutional:      General: He is not in acute distress.    Appearance: Normal appearance. He is well-developed. He is obese. He is not ill-appearing or diaphoretic.  HENT:     Head: Normocephalic and atraumatic.     Right Ear: Tympanic membrane, ear canal and external ear normal.     Left Ear: Tympanic membrane, ear canal and external ear normal.     Nose: Nose normal. No congestion.     Mouth/Throat:     Mouth: Mucous membranes are moist.     Pharynx: Oropharynx is clear. No posterior oropharyngeal erythema.  Eyes:     General: No scleral icterus.  Right eye: No discharge.        Left eye: No discharge.     Conjunctiva/sclera: Conjunctivae normal.     Pupils: Pupils are equal, round, and reactive to light.  Neck:     Thyroid: No thyromegaly.     Vascular: No carotid bruit or JVD.  Cardiovascular:     Rate and Rhythm: Normal rate and regular rhythm.     Pulses: Normal pulses.     Heart sounds: Normal heart sounds.     No gallop.  Pulmonary:     Effort: Pulmonary effort is normal. No respiratory distress.      Breath sounds: Normal breath sounds. No wheezing or rales.     Comments: Good air exch Chest:     Chest wall: No tenderness.  Abdominal:     General: Bowel sounds are normal. There is no distension or abdominal bruit.     Palpations: Abdomen is soft. There is no mass.     Tenderness: There is no abdominal tenderness.     Hernia: No hernia is present.  Musculoskeletal:        General: No tenderness.     Cervical back: Normal range of motion and neck supple. No rigidity. No muscular tenderness.     Right lower leg: No edema.     Left lower leg: No edema.  Lymphadenopathy:     Cervical: No cervical adenopathy.  Skin:    General: Skin is warm and dry.     Coloration: Skin is not jaundiced or pale.     Findings: No bruising, erythema, lesion or rash.  Neurological:     Mental Status: He is alert.     Cranial Nerves: No cranial nerve deficit.     Motor: No abnormal muscle tone.     Coordination: Coordination normal.     Gait: Gait normal.     Deep Tendon Reflexes: Reflexes are normal and symmetric. Reflexes normal.  Psychiatric:        Mood and Affect: Mood normal.        Cognition and Memory: Cognition normal.           Assessment & Plan:   Problem List Items Addressed This Visit       Cardiovascular and Mediastinum   HYPERTENSION, BENIGN ESSENTIAL    bp in fair control at this time  BP Readings from Last 1 Encounters:  02/08/22 122/80  No changes needed Most recent labs reviewed  Disc lifstyle change with low sodium diet and exercise  Plan to continue aldactone 50 mg daily      Relevant Medications   spironolactone (ALDACTONE) 50 MG tablet   Portal hypertension ([facility])    No further alcohol intake  bp is controlled       Relevant Medications   spironolactone (ALDACTONE) 50 MG tablet     Digestive   Alcoholic liver disease ([facility])    LFTs are stable with nl AST and aLT No alcohol intake         Endocrine   DM (diabetes mellitus), type 2 ([place])    Lab  Results  Component Value Date   HGBA1C 9.7 (H) 02/01/2022  Overdue for endocrinology visit inst to schedule this  Taking oral semaglutide  Diet and exercise are much improved      Hyperlipidemia associated with type 2 diabetes mellitus ([place])    Disc goals for lipids and reasons to control them Rev last labs with pt Rev low sat fat diet in detail  On repatha  LDL down to 63 At goal Trig are better  Much improved diet as well      Relevant Medications   spironolactone (ALDACTONE) 50 MG tablet     Other   Elevated TSH    Normal this check  Lab Results  Component Value Date   TSH 3.35 02/01/2022         Hx of adenomatous colonic polyps    Due for 3 y f/u colonoscopy  Referral sent to GI      Relevant Orders   Ambulatory referral to Gastroenterology   Prostate cancer screening    Lab Results  Component Value Date   PSA 0.37 02/01/2022   PSA 0.20 10/31/2020   PSA 0.20 10/29/2018   No urinary changes No family history      Routine general medical examination at a health care facility - Primary    Reviewed health habits including diet and exercise and skin cancer prevention Reviewed appropriate screening tests for age  Also reviewed health mt list, fam hx and immunization status , as well as social and family history   See HPI Labs reviewed  Health habits are much improved Flu shot given  Plan to return in 1 mo for shingrix vaccine  Colonoscopy due/ GI referral sent  Counseled on smoking cessation  Reassuring psa       Smoking    Commended on cutting back  Should be quit soon-encouraged to stick with quit date this mo      Other Visit Diagnoses     Need for influenza vaccination       Relevant Orders   Flu Vaccine QUAD 6+ mos PF IM (Fluarix Quad PF) (Completed)

## 2022-02-08 NOTE — Patient Instructions (Addendum)
Flu shot today   Schedule a nurse visit for the shingrix vaccine in a month     Please call and schedule your 3 year colonoscopy   [facility]  [phone]   Call endocrinology to set up an appt to get set up with someone new

## 2022-02-10 DIAGNOSIS — Z0000 Encounter for general adult medical examination without abnormal findings: Secondary | ICD-10-CM | POA: Insufficient documentation

## 2022-02-10 NOTE — Assessment & Plan Note (Signed)
Lab Results  Component Value Date   PSA 0.37 02/01/2022   PSA 0.20 10/31/2020   PSA 0.20 10/29/2018    No urinary changes No family history

## 2022-02-10 NOTE — Assessment & Plan Note (Signed)
Reviewed health habits including diet and exercise and skin cancer prevention Reviewed appropriate screening tests for age  Also reviewed health mt list, fam hx and immunization status , as well as social and family history   See HPI Labs reviewed  Health habits are much improved Flu shot given  Plan to return in 1 mo for shingrix vaccine  Colonoscopy due/ GI referral sent  Counseled on smoking cessation  Reassuring psa

## 2022-02-10 NOTE — Assessment & Plan Note (Signed)
No further alcohol intake  bp is controlled

## 2022-02-10 NOTE — Assessment & Plan Note (Signed)
Normal this check  Lab Results  Component Value Date   TSH 3.35 02/01/2022

## 2022-02-10 NOTE — Assessment & Plan Note (Signed)
LFTs are stable with nl AST and aLT No alcohol intake

## 2022-02-10 NOTE — Assessment & Plan Note (Signed)
Disc goals for lipids and reasons to control them Rev last labs with pt Rev low sat fat diet in detail  On repatha  LDL down to 63 At goal Trig are better  Much improved diet as well

## 2022-02-10 NOTE — Assessment & Plan Note (Signed)
Commended on cutting back  Should be quit soon-encouraged to stick with quit date this mo

## 2022-02-10 NOTE — Assessment & Plan Note (Signed)
Lab Results  Component Value Date   HGBA1C 9.7 (H) 02/01/2022   Overdue for endocrinology visit inst to schedule this  Taking oral semaglutide  Diet and exercise are much improved

## 2022-02-10 NOTE — Assessment & Plan Note (Signed)
Discussed how this problem influences overall health and the risks it imposes  Reviewed plan for weight loss with lower calorie diet (via better food choices and also portion control or program like weight watchers) and exercise building up to or more than 30 minutes 5 days per week including some aerobic activity   Wt loss commended so far Much better habits

## 2022-02-10 NOTE — Assessment & Plan Note (Signed)
bp in fair control at this time  BP Readings from Last 1 Encounters:  02/08/22 122/80   No changes needed Most recent labs reviewed  Disc lifstyle change with low sodium diet and exercise  Plan to continue aldactone 50 mg daily

## 2022-02-10 NOTE — Assessment & Plan Note (Signed)
Due for 3 y f/u colonoscopy  Referral sent to GI

## 2022-04-23 ENCOUNTER — Other Ambulatory Visit: Payer: Self-pay | Admitting: Pharmacist

## 2022-04-23 MED ORDER — REPATHA SURECLICK 140 MG/ML ~~LOC~~ SOAJ: 1.0000 mL | SUBCUTANEOUS | 3 refills | Status: DC

## 2022-05-23 ENCOUNTER — Encounter: Payer: Self-pay | Admitting: Family Medicine

## 2022-05-23 ENCOUNTER — Ambulatory Visit (INDEPENDENT_AMBULATORY_CARE_PROVIDER_SITE_OTHER): Payer: No Typology Code available for payment source | Admitting: Family Medicine

## 2022-05-23 VITALS — BP 126/78 | HR 100 | Temp 98.0°F | Ht 65.0 in | Wt 195.2 lb

## 2022-05-23 DIAGNOSIS — S46012A Strain of muscle(s) and tendon(s) of the rotator cuff of left shoulder, initial encounter: Secondary | ICD-10-CM

## 2022-05-23 DIAGNOSIS — M25512 Pain in left shoulder: Secondary | ICD-10-CM | POA: Diagnosis not present

## 2022-05-23 DIAGNOSIS — G8929 Other chronic pain: Secondary | ICD-10-CM

## 2022-05-23 MED ORDER — TRIAMCINOLONE ACETONIDE 40 MG/ML IJ SUSP
40.0000 mg | Freq: Once | INTRAMUSCULAR | Status: AC
  Administered 2022-05-23: 40 mg via INTRA_ARTICULAR

## 2022-05-23 NOTE — Progress Notes (Signed)
Zachary Base T. Acquanetta Cabanilla, MD, [place] at [facility] [place], [address]  Phone: [phone]  FAX: [phone]  [name] - 62 y.o. male  MRN [id number]  Date of Birth: [birth year]  Date: 05/23/2022  PCP: Abner Greenspan, MD  Referral: Abner Greenspan, MD  Chief Complaint  Patient presents with   Shoulder Pain    C/o L shoulder pain. Started about 2 wks ago after a fall at work. Tried [employer], OTC arthritis cream and heating pad- not helpful.    Subjective:   Zachary Lewis is a 62 y.o. very pleasant male patient with Body mass index is 32.49 kg/m. who presents with the following:  Pleasant patient presents with left-sided shoulder pain.  I actually saw him several years ago when he had what appeared to be a frozen shoulder.  He presents today after an acute injury roughly 2 weeks ago.  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  Since then he has had some pain on the caudal aspect of the shoulder as well as just posterolateral.  He does have some pain in the plane of abduction, and he has some pain with lowering his shoulder, but his strength is been preserved.  L RTC strain - some pain with abduction and EROM.   Inj L intraart shoulder  Review of Systems is noted in the HPI, as appropriate  Objective:   BP 126/78   Pulse 100   Temp 98 F (36.7 C) (Temporal)   Ht '5\' 5"'$  (1.651 m)   Wt 195 lb 4 oz (88.6 kg)   SpO2 95%   BMI 32.49 kg/m   GEN: No acute distress; alert,appropriate. PULM: Breathing comfortably in no respiratory distress PSYCH: Normally interactive.   Left shoulder: Nontender along the clavicle, bicipital groove or AC joint.  Does have some minimal pain at the supraspinatus insertion. Abduction is approaching full with a 4++/5 strength Flexion is approaching full strength is 5/5 External  range of motion with the shoulder abducted to 90 is at roughly 75 degrees, equal to the contralateral side, but strength is 4 -/5 Internal range of motion is full and equal to the opposite side and strength is 5/5  Crossover is positive Speeds and Yergason's are negative Jobe's mildly positive Neer and crossover testing is mildly positive  Laboratory and Imaging Data:  Assessment and Plan:     ICD-10-CM   1. Rotator cuff strain, left, initial encounter  S46.012A     2. Chronic left shoulder pain  M25.512 triamcinolone acetonide (KENALOG-40) injection 40 mg   G89.29      All this was marked as a chronic shoulder pain above, this is actually an acute shoulder injury from 2 weeks ago.  I do not know how to change this in the medical record.  When he fell in the shoulder, it seems like he strained his rotator cuff.  I am going to have him continue basic motion and work on a day-to-day activities.  Will also do a intra-articular injection today to try to calm down his pain level.  Social: Right now his pain is limiting his full function and maximal exercise  Intraarticular Shoulder Aspiration/Injection Procedure Note Zachary Lewis 01/30/1961 Date of procedure: 05/23/2022  Procedure: Large Joint Aspiration / Injection of Shoulder, Intraarticular, L Indications: Pain  Procedure Details  Verbal consent was obtained from the patient. Risks explained and contrasted with benefits and alternatives. Patient prepped with Chloraprep and Ethyl Chloride used for anesthesia. An intraarticular shoulder injection was performed using the posterior approach; needle placed into joint capsule without difficulty. The patient tolerated the procedure well and had decreased pain post injection. No complications. Injection: 9 cc of Lidocaine 1% and 1 mL Kenalog 40 mg. Needle: 21 gauge, 2 inch   Medication Management during today's office visit: Meds ordered this encounter  Medications   triamcinolone  acetonide (KENALOG-40) injection 40 mg   There are no discontinued medications.  Orders placed today for conditions managed today: No orders of the defined types were placed in this encounter.   Disposition: No follow-ups on file.  [employer] speech-to-text software was used for transcription in this dictation.  Possible transcriptional errors can occur using Editor, commissioning.   Signed,  Maud Deed. Yulissa Needham, MD   Outpatient Encounter Medications as of 05/23/2022  Medication Sig   aspirin EC 81 MG tablet Take 1 tablet (81 mg total) by mouth daily. Swallow whole.   clopidogrel (PLAVIX) 75 MG tablet Take 1 tablet (75 mg total) by mouth daily.   Evolocumab (REPATHA SURECLICK) 157 MG/ML SOAJ Inject 140 mg into the skin every 14 (fourteen) days.   glucose blood (ONETOUCH VERIO) test strip 1 each by Other route daily. And lancets 1/day   latanoprost (XALATAN) 0.005 % ophthalmic solution SMARTSIG:In Eye(s)   Semaglutide (RYBELSUS) 3 MG TABS Take 3 mg by mouth daily.   spironolactone (ALDACTONE) 50 MG tablet Take 1 tablet (50 mg total) by mouth daily.   tadalafil (CIALIS) 20 MG tablet Take 1 tablet (20 mg total) by mouth as needed.   tadalafil (CIALIS) 20 MG tablet Take 1 tablet (20 mg total) by mouth daily as needed.   thiamine 100 MG tablet Take 1 tablet (100 mg total) by mouth daily.   [EXPIRED] triamcinolone acetonide (KENALOG-40) injection 40 mg    No facility-administered encounter medications on file as of 05/23/2022.

## 2022-05-30 ENCOUNTER — Ambulatory Visit (INDEPENDENT_AMBULATORY_CARE_PROVIDER_SITE_OTHER): Payer: No Typology Code available for payment source

## 2022-05-30 DIAGNOSIS — Z23 Encounter for immunization: Secondary | ICD-10-CM

## 2022-06-10 ENCOUNTER — Other Ambulatory Visit (HOSPITAL_COMMUNITY): Payer: Self-pay

## 2022-06-25 ENCOUNTER — Ambulatory Visit: Payer: No Typology Code available for payment source | Admitting: Cardiovascular Disease

## 2022-07-09 ENCOUNTER — Encounter: Payer: Self-pay | Admitting: Internal Medicine

## 2022-07-09 ENCOUNTER — Ambulatory Visit: Payer: No Typology Code available for payment source | Admitting: Cardiovascular Disease

## 2022-07-12 ENCOUNTER — Telehealth (INDEPENDENT_AMBULATORY_CARE_PROVIDER_SITE_OTHER): Payer: No Typology Code available for payment source | Admitting: Family Medicine

## 2022-07-12 ENCOUNTER — Encounter: Payer: Self-pay | Admitting: Family Medicine

## 2022-07-12 VITALS — Temp 98.0°F | Ht 65.0 in | Wt 192.0 lb

## 2022-07-12 DIAGNOSIS — J069 Acute upper respiratory infection, unspecified: Secondary | ICD-10-CM | POA: Insufficient documentation

## 2022-07-12 NOTE — Assessment & Plan Note (Signed)
Anticipate viral URI - he notes symptoms are improving as of last night.  Rec start flonase nasal spray, continue mucinex with plenty of water, vit C, cit D.  Supportive measures reviewed as well as signs of bacterial sinusitis to notify us for abx course.  Pt agrees with plan.

## 2022-07-12 NOTE — Progress Notes (Signed)
Patient ID: Zachary Lewis, male    DOB: 10-15-1960, 62 y.o.   MRN: [id number]  Virtual visit completed through [place], a video enabled telemedicine application. Due to national recommendations of social distancing due to COVID-19, a virtual visit is felt to be most appropriate for this patient at this time. Reviewed limitations, risks, security and privacy concerns of performing a virtual visit and the availability of in person appointments. I also reviewed that there may be a patient responsible charge related to this service. The patient agreed to proceed.   Patient location: in office at work Provider location: Financial controller at [facility], office Persons participating in this virtual visit: patient, provider   If any vitals were documented, they were collected by patient at home unless specified below.    Temp 98 F (36.7 C)   Ht '5\' 5"'$  (1.651 m)   Wt 192 lb (87.1 kg)   BMI 31.95 kg/m    CC: cough, congestion Subjective:   HPI: Zachary Lewis is a 62 y.o. male presenting on 07/12/2022 for Nasal Congestion (C/o nasal congestion. Sxs started 07/05/22. Tried Mucinex and Vicks Vapor. Feels better today and returned to work.)   1 wk h/o head and nasal congestion, initial cough.  No fevers/chills, ear or tooth pain, ST, HA, body aches. No chest pain or dyspnea, abd pain, nausea, diarrhea.  Treated with mucinex over weekend, vicks vaporub, rest.  Tested negative for COVID earlier this week.   Actually last night started feeling better and returned to work this morning.   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 On aspirin and plavix for h/o PAD.  Has cut down smoking since sick - previously smoking 2 cig/day.  Lab Results  Component Value Date   HGBA1C 9.7 (H) 02/01/2022       Relevant past medical, surgical, family and social history reviewed and updated as indicated.  Interim medical history since our last visit reviewed. Allergies and medications reviewed and updated. Outpatient Medications Prior to Visit  Medication Sig Dispense Refill   aspirin EC 81 MG tablet Take 1 tablet (81 mg total) by mouth daily. Swallow whole. 90 tablet 3   clopidogrel (PLAVIX) 75 MG tablet Take 1 tablet (75 mg total) by mouth daily. 90 tablet 3   Evolocumab (REPATHA SURECLICK) [id number] MG/ML SOAJ Inject 140 mg into the skin every 14 (fourteen) days. 6 mL 3   glucose blood (ONETOUCH VERIO) test strip 1 each by Other route daily. And lancets 1/day 100 each 3   latanoprost (XALATAN) 0.005 % ophthalmic solution SMARTSIG:In Eye(s)     Semaglutide (RYBELSUS) 3 MG TABS Take 3 mg by mouth daily. 90 tablet 3   spironolactone (ALDACTONE) 50 MG tablet Take 1 tablet (50 mg total) by mouth daily. 90 tablet 3   tadalafil (CIALIS) 20 MG tablet Take 1 tablet (20 mg total) by mouth as needed. 10 tablet 11   tadalafil (CIALIS) 20 MG tablet Take 1 tablet (20 mg total) by mouth daily as needed. 90 tablet 2   thiamine 100 MG tablet Take 1 tablet (100 mg total) by mouth daily. 30 tablet 0   No facility-administered medications prior to visit.     Per HPI unless specifically indicated in ROS section below Review of Systems Objective:  Temp 98 F (36.7 C)   Ht '5\' 5"'$  (1.651 m)   Wt 192 lb (87.1 kg)  BMI 31.95 kg/m   Wt Readings from Last 3 Encounters:  07/12/22 192 lb (87.1 kg)  05/23/22 195 lb 4 oz (88.6 kg)  02/08/22 192 lb 9.6 oz (87.4 kg)       Physical exam: Gen: alert, NAD, not ill appearing Pulm: speaks in complete sentences without increased work of breathing Psych: normal mood, normal thought content      Assessment & Plan:   Viral URI Assessment & Plan: Anticipate viral URI - he notes symptoms are improving as of last night.  Rec start flonase nasal spray, continue mucinex with plenty of water, vit C, cit D.  Supportive measures reviewed as well as signs of bacterial  sinusitis to notify us for abx course.  Pt agrees with plan.       I discussed the assessment and treatment plan with the patient. The patient was provided an opportunity to ask questions and all were answered. The patient agreed with the plan and demonstrated an understanding of the instructions. The patient was advised to call back or seek an in-person evaluation if the symptoms worsen or if the condition fails to improve as anticipated.  Follow up plan: No follow-ups on file.  Ria Bush, MD

## 2022-08-05 ENCOUNTER — Ambulatory Visit: Payer: No Typology Code available for payment source | Admitting: Family Medicine

## 2022-08-06 ENCOUNTER — Encounter: Payer: Self-pay | Admitting: Cardiovascular Disease

## 2022-08-06 ENCOUNTER — Ambulatory Visit
Payer: No Typology Code available for payment source | Attending: Cardiovascular Disease | Admitting: Cardiovascular Disease

## 2022-08-06 VITALS — BP 140/98 | HR 91 | Ht 67.0 in | Wt 199.2 lb

## 2022-08-06 DIAGNOSIS — Z720 Tobacco use: Secondary | ICD-10-CM | POA: Diagnosis not present

## 2022-08-06 DIAGNOSIS — I739 Peripheral vascular disease, unspecified: Secondary | ICD-10-CM | POA: Diagnosis not present

## 2022-08-06 DIAGNOSIS — I10 Essential (primary) hypertension: Secondary | ICD-10-CM | POA: Diagnosis not present

## 2022-08-06 DIAGNOSIS — E785 Hyperlipidemia, unspecified: Secondary | ICD-10-CM

## 2022-08-06 NOTE — Patient Instructions (Signed)
Medication Instructions:  No changes *If you need a refill on your cardiac medications before your next appointment, please call your pharmacy*   Lab Work: None ordered If you have labs (blood work) drawn today and your tests are completely normal, you will receive your results only by: MyChart Message (if you have MyChart) OR A paper copy in the mail If you have any lab test that is abnormal or we need to change your treatment, we will call you to review the results.   Testing/Procedures: None ordered   Follow-Up: At [facility], you and your health needs are our priority.  As part of our continuing mission to provide you with exceptional heart care, we have created designated Provider Care Teams.  These Care Teams include your primary Cardiologist (physician) and Advanced Practice Providers (APPs -  Physician Assistants and Nurse Practitioners) who all work together to provide you with the care you need, when you need it.  We recommend signing up for the patient portal called "MyChart".  Sign up information is provided on this After Visit Summary.  MyChart is used to connect with patients for Virtual Visits (Telemedicine).  Patients are able to view lab/test results, encounter notes, upcoming appointments, etc.  Non-urgent messages can be sent to your provider as well.   To learn more about what you can do with MyChart, go to [email or web address].    Your next appointment:   6 month(s)  Provider:   Dr. Arida  

## 2022-08-06 NOTE — Progress Notes (Signed)
Cardiology Office Note   Date:  08/06/2022   ID:  Zachary Lewis, DOB 21-Oct-1960, MRN [id number]  PCP:  Abner Greenspan, MD  Cardiologist:  Dr. Acie Fredrickson  No chief complaint on file.      History of Present Illness: Zachary Lewis is a 62 y.o. male who is here today for follow-up visit regarding peripheral arterial disease.   He has known history of essential hypertension, type 2 diabetes, hyperlipidemia, liver cirrhosis due to NASH and tobacco use. He is followed for bilateral leg claudication with below the knee disease. He underwent noninvasive vascular evaluation in September 2022 which showed an ABI of 0.81 on the right and 0.72 on the left.  Duplex showed mainly below the knee disease bilaterally.  On the right side, there was one-vessel runoff via the peroneal artery.  On the left side, there was evidence of popliteal/TP trunk aneurysm measuring 1.8 cm with occluded tibial peroneal vessels. 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 He was placed on dual antiplatelet therapy.   He has chronic numbness in the left foot but his claudication resolved and he is able to walk 2 miles with no issues.  He is taking Repatha on a regular basis.  He quit smoking last month.  No chest pain or shortness of breath.  Past Medical History:  Diagnosis Date   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[place]) 08/04/2018   Diabetes mellitus    Type II   Hyperlipidemia    Hypertension    NSAID-induced gastric ulcer 12/13/2018   EGD 11/2018 H pylori neg Omeprazole started   Obesity    Personal history of colonic adenomas 03/09/2013   Tinea pedis    Chronic    Past Surgical History:  Procedure Laterality Date   COLONOSCOPY     COLONOSCOPY W/ POLYPECTOMY  2014   IR PARACENTESIS  07/02/2018   IR TRANSCATHETER BX  07/17/2018   IR VENOGRAM HEPATIC WO  HEMODYNAMIC EVALUATION  07/17/2018   myringectomy  06/2005   POLYPECTOMY     UMBILICAL HERNIA REPAIR  01/1997   VASECTOMY  07/2002     Current Outpatient Medications  Medication Sig Dispense Refill   aspirin EC 81 MG tablet Take 1 tablet (81 mg total) by mouth daily. Swallow whole. 90 tablet 3   clopidogrel (PLAVIX) 75 MG tablet Take 1 tablet (75 mg total) by mouth daily. 90 tablet 3   Evolocumab (REPATHA SURECLICK) [id number] MG/ML SOAJ Inject 140 mg into the skin every 14 (fourteen) days. 6 mL 3   glucose blood (ONETOUCH VERIO) test strip 1 each by Other route daily. And lancets 1/day 100 each 3   latanoprost (XALATAN) 0.005 % ophthalmic solution SMARTSIG:In Eye(s)     Semaglutide (RYBELSUS) 3 MG TABS Take 3 mg by mouth daily. 90 tablet 3   spironolactone (ALDACTONE) 50 MG tablet Take 1 tablet (50 mg total) by mouth daily. 90 tablet 3   tadalafil (CIALIS) 20 MG tablet Take 1 tablet (20 mg total) by mouth as needed. 10 tablet 11   tadalafil (CIALIS) 20 MG tablet Take 1 tablet (20 mg total) by mouth daily as needed. 90 tablet 2   thiamine 100 MG tablet Take 1 tablet (100 mg total) by mouth daily. 30 tablet 0   No current facility-administered medications for this visit.    Allergies:  Patient has no known allergies.    Social History:  The patient  reports that he has been smoking cigarettes. He has been smoking an average of .25 packs per day. He has never used smokeless tobacco. He reports that he does not currently use alcohol. He reports that he does not use drugs.   Family History:  The patient's family history includes Hypertension in an other family member.    ROS:  Please see the history of present illness.   Otherwise, review of systems are positive for none.   All other systems are reviewed and negative.    PHYSICAL EXAM: VS:  BP (!) 140/98 (BP Location: Left Arm, Patient Position: Sitting)   Pulse 91   Ht 5\' 7"  (1.702 m)   Wt 199 lb 3.2 oz (90.4 kg)   SpO2 100%   BMI  31.20 kg/m  , BMI Body mass index is 31.2 kg/m. GEN: Well nourished, well developed, in no acute distress  HEENT: normal  Neck: no JVD, carotid bruits, or masses Cardiac: RRR; no murmurs, rubs, or gallops,no edema  Respiratory:  clear to auscultation bilaterally, normal work of breathing GI: soft, nontender, nondistended, + BS MS: no deformity or atrophy  Skin: warm and dry, no rash Neuro:  Strength and sensation are intact Psych: euthymic mood, full affect Vascular: Femoral pulses normal bilaterally.  Distal pulses are not palpable.     EKG:  EKG  ordered today. EKG showed normal sinus rhythm with no significant ST or T wave changes.   Recent Labs: 02/01/2022: ALT 48; BUN 19; Creatinine, Ser 0.89; Hemoglobin 15.5; Platelets 235.0; Potassium 4.7; Sodium 136; TSH 3.35    Lipid Panel    Component Value Date/Time   CHOL 148 02/01/2022 0816   TRIG 104.0 02/01/2022 0816   HDL 64.40 02/01/2022 0816   CHOLHDL 2 02/01/2022 0816   VLDL 20.8 02/01/2022 0816   LDLCALC 63 02/01/2022 0816   LDLDIRECT 176.9 12/08/2012 0807      Wt Readings from Last 3 Encounters:  08/06/22 199 lb 3.2 oz (90.4 kg)  07/12/22 192 lb (87.1 kg)  05/23/22 195 lb 4 oz (88.6 kg)           No data to display            ASSESSMENT AND PLAN:  1.  Peripheral arterial disease: He reports resolution of claudication and he seems to be asymptomatic overall.    He does have a left popliteal artery aneurysm but the TP trunk and anterior tibial arteries are both occluded proximally likely from embolization.  Thus, risk of further embolization is low and thus there is likely very little benefit of treating the left popliteal artery aneurysm.  Continue dual antiplatelet therapy.    2.  Tobacco use: I congratulated him on smoking cessation.  He quit last month.  3.  Hyperlipidemia: Intolerance to statins.  Previous LDL was 181.  Since then, he has been doing well with Repatha and repeat lipid profile in  September showed an LDL of 63.  4.  Essential hypertension: His blood pressure is elevated today but he had a stressful morning and usually his blood pressure is not elevated.  Thus, I elected not to add another medication.  He is on spironolactone.  He already follows low-sodium diet.    Disposition:   FU with me in 6 months  Signed,  Kathlyn Sacramento, MD  08/06/2022 8:21 AM    [place]

## 2022-09-12 LAB — HM DIABETES EYE EXAM

## 2022-09-23 ENCOUNTER — Telehealth: Payer: Self-pay | Admitting: Cardiovascular Disease

## 2022-09-23 MED ORDER — REPATHA SURECLICK 140 MG/ML ~~LOC~~ SOAJ: 1.0000 mL | SUBCUTANEOUS | 3 refills | Status: DC

## 2022-09-23 NOTE — Telephone Encounter (Signed)
 *  STAT* If patient is at the pharmacy, call can be transferred to refill team.   1. Which medications need to be refilled? (please list name of each medication and dose if known)   Evolocumab (REPATHA SURECLICK) 140 MG/ML SOAJ    2. Which pharmacy/location (including street and city if local pharmacy) is medication to be sent to?  [id number] - [place], [place] - [address] AT [facility] [address]    3. Do they need a 30 day or 90 day supply? 30 days

## 2022-09-23 NOTE — Telephone Encounter (Signed)
Refills has been sent to the pharmacy. 

## 2022-09-24 ENCOUNTER — Encounter: Payer: Self-pay | Admitting: Internal Medicine

## 2022-09-24 ENCOUNTER — Telehealth: Payer: Self-pay

## 2022-09-24 ENCOUNTER — Ambulatory Visit (INDEPENDENT_AMBULATORY_CARE_PROVIDER_SITE_OTHER): Payer: No Typology Code available for payment source | Admitting: Internal Medicine

## 2022-09-24 VITALS — BP 136/86 | HR 97 | Ht 67.0 in | Wt 197.0 lb

## 2022-09-24 DIAGNOSIS — K7460 Unspecified cirrhosis of liver: Secondary | ICD-10-CM | POA: Diagnosis not present

## 2022-09-24 DIAGNOSIS — Z7902 Long term (current) use of antithrombotics/antiplatelets: Secondary | ICD-10-CM | POA: Diagnosis not present

## 2022-09-24 DIAGNOSIS — R188 Other ascites: Secondary | ICD-10-CM

## 2022-09-24 DIAGNOSIS — Z86010 Personal history of colonic polyps: Secondary | ICD-10-CM | POA: Diagnosis not present

## 2022-09-24 DIAGNOSIS — Z860101 Personal history of adenomatous and serrated colon polyps: Secondary | ICD-10-CM

## 2022-09-24 NOTE — Progress Notes (Unsigned)
Zachary Lewis 62 y.o. 09-15-1960 [id number]  Assessment & Plan:   Encounter Diagnoses  Name Primary?   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  He needs additional labs and an ultrasound to follow-up on liver disease as well.  He could not stay in schedule or do those today because he needed to get back to work.  Patient is on oral semaglutide -follows with [name] 3 days  Regarding cirrhosis.  I do not think he ever got immunized for hepatitis A or B as previously recommended.  He saw the [facility] when he was quite ill in July 2020.  He is much better now clinically it seems.  We do need laboratory follow-up which I will try to arrange to the endocrine clinic since he has upcoming labs.  We will also arrange for an ultrasound of the liver and we will call him to set that up.   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  I last saw him in 2020.  He presents today states that he is feeling well overall, he is largely abstinent but not completely so, he may have had 4-5 drinks in the last 3 months.  He was drinking heavier at the time of the diagnosis of cirrhosis.  He takes Plavix for peripheral vascular disease.  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  Liver report from CT angio of the abdomen and pelvis 04/30/2021 Hepatobiliary: Nodularity of the hepatic  contour suggestive of hepatic cirrhosis. No discrete hyperenhancing hepatic lesions. Normal appearance of the gallbladder given degree distention. No radiopaque gallstones. No intra or extrahepatic biliary duct dilatation. No ascites.      Colon polyp history:  02/2013 - 5 tubular adenomas max 7 mm  12/08/2018 5 diminutive adenomas   EGD 12/08/2018   - Non-bleeding gastric ulcer with pigmented                            material. Biopsied.                           - Gastritis. Biopsied.                           - Portal hypertensive gastropathy.                           - The examination was otherwise normal  1. Surgical [P], gastric antrum - ULCERATED REACTIVE GASTROPATHY - NO H. PYLORI OR INTESTINAL METAPLASIA IDENTIFIED - SEE COMMENT 2. Surgical [P], gastric antrum - MILD CHRONIC GASTRITIS WITHOUT ACTIVITY - NO H. PYLORI OR INTESTINAL METAPLASIA IDENTIFIED   Liver biopsy 07/17/2018: Severely active steatohepatitis (grade 3 of 3) with cirrhosis (stage 4 of 4). Hepatic nodules  show mild macrovesicular steatosis (about 15%) with market ballooning degeneration and numerous well-formed [employer]. Multiple foci of lobular necroinflammatory with some showing neutrophilic satellitosis are present. Prominent hepatocellular cholestasis is present. Portal tracts and fibrous septae show mostly mild lymphocytic inflammation and well preserved biliary and vascular structures. Trichrome and reticulin stain show cirrhosis and parenchymal collapse. Iron stain shows minimal granular iron accumulation within hepatocytes. PASD stain shows no globular inclusions. 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 Serologies 2020: HAV IgM nonreactive, HBsAg nonreactive, HBcAb IgM nonreactive HCV AB nonreactive, HIV nonreactive transferrin saturation 43%, ferritin 1432, alpha-1 antitrypsin 198, ANA negative, ASMA negative,  IgG 1158, HBcAb total nonreactive, HBsAb nonreactive, HAV total nonreactive      No Known Allergies Current Meds  Medication Sig   aspirin EC 81 MG tablet Take 1 tablet (81 mg total) by mouth daily. Swallow whole.   clopidogrel (PLAVIX) 75 MG tablet Take 1 tablet (75 mg total) by mouth daily.   Evolocumab (REPATHA SURECLICK) 140 MG/ML SOAJ Inject 140 mg into the skin every 14 (fourteen) days.   glucose blood (ONETOUCH VERIO) test strip 1 each by Other route daily. And lancets 1/day   latanoprost (XALATAN) 0.005 % ophthalmic solution SMARTSIG:In Eye(s)   Semaglutide (RYBELSUS) 3 MG TABS Take 3 mg by mouth daily.   spironolactone (ALDACTONE) 50 MG tablet Take 1 tablet (50 mg total) by mouth daily.   tadalafil (CIALIS) 20 MG tablet Take 1 tablet (20 mg total) by mouth as needed.   tadalafil (CIALIS) 20 MG tablet Take 1 tablet (20 mg total) by mouth daily as needed.   thiamine 100 MG tablet Take 1 tablet (100 mg total) by mouth daily.   Past Medical History:  Diagnosis Date   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) 08/04/2018   Diabetes mellitus    Type II   Hyperlipidemia    Hypertension    NSAID-induced gastric ulcer 12/13/2018   EGD 11/2018 H pylori neg Omeprazole started   Obesity    Personal history of colonic adenomas 03/09/2013   Tinea pedis    Chronic   Past Surgical History:  Procedure Laterality Date   COLONOSCOPY     COLONOSCOPY W/ POLYPECTOMY  2014   IR PARACENTESIS  07/02/2018   IR TRANSCATHETER BX  07/17/2018   IR VENOGRAM HEPATIC WO HEMODYNAMIC EVALUATION  07/17/2018   myringectomy  06/2005   POLYPECTOMY     UMBILICAL HERNIA REPAIR  01/1997   VASECTOMY  07/2002   Social History   Social History Narrative   Married, Production designer, 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 [employer]   Smoker, heavy drinking of [place] alcohol in past   No drug use   family history includes Hypertension in an other family member.   Review of Systems As above  Objective:   Physical  Exam @BP  136/86   Pulse 97   Ht 5\' 7"  (1.702 m)   Wt 197 lb (89.4 kg)   BMI 30.85 kg/m @  General:  NAD Eyes:   anicteric Lungs:  clear Heart::  S1S2 no rubs, murmurs or gallops Abdomen:  soft and nontender, BS+ Ext:   no edema, cyanosis or clubbing    Data Reviewed:  See HPI

## 2022-09-24 NOTE — Telephone Encounter (Signed)
[facility] Pre-operative Risk Assessment     Request for surgical clearance:     Endoscopy Procedure  What type of surgery is being performed?     EGD and colonoscopy  When is this surgery scheduled?     11/25/2022  What type of clearance is required ?   Pharmacy  Are there any medications that need to be held prior to surgery and how long? Plavix, 5 days  Practice name and name of physician performing surgery?      [facility]  What is your office phone and fax number?      Phone- [phone]  Fax- [phone]  Anesthesia type (None, local, MAC, general) ?       MAC

## 2022-09-24 NOTE — Patient Instructions (Addendum)
You have been scheduled for an endoscopy and colonoscopy. Please follow the written instructions given to you at your visit today. Please pick up your prep supplies at the pharmacy within the next 1-3 days. If you use inhalers (even only as needed), please bring them with you on the day of your procedure.   You will be contaced by our office prior to your procedure for directions on holding your Plavix..  If you do not hear from our office 1 week prior to your scheduled procedure, please call [phone] to discuss.   I appreciate the opportunity to care for you. Stan Head, MD, [facility]

## 2022-09-25 ENCOUNTER — Telehealth: Payer: Self-pay

## 2022-09-25 ENCOUNTER — Telehealth: Payer: Self-pay | Admitting: Cardiovascular Disease

## 2022-09-25 ENCOUNTER — Other Ambulatory Visit (HOSPITAL_COMMUNITY): Payer: Self-pay

## 2022-09-25 DIAGNOSIS — E1165 Type 2 diabetes mellitus with hyperglycemia: Secondary | ICD-10-CM

## 2022-09-25 NOTE — Telephone Encounter (Signed)
Orders Placed This Encounter  Procedures   Comprehensive metabolic panel    Standing Status:   Future    Standing Expiration Date:   03/28/2023   Hemoglobin A1c    Standing Status:   Future    Standing Expiration Date:   03/28/2023   Lipid panel    Standing Status:   Future    Standing Expiration Date:   03/28/2023   Microalbumin / creatinine urine ratio    Standing Status:   Future    Standing Expiration Date:   03/28/2023

## 2022-09-25 NOTE — Telephone Encounter (Signed)
Pharmacy Patient Advocate Encounter   Received notification from [facility] that prior authorization for REPATHA is needed.    PA submitted on 09/25/22 Key [id number] Status is pending  Haze Rushing, CPhT Pharmacy Patient Advocate Specialist Direct Number: [phone] Fax: [phone]

## 2022-09-25 NOTE — Telephone Encounter (Signed)
Pt c/o medication issue:  1. Name of Medication:   Evolocumab (REPATHA SURECLICK) 140 MG/ML SOAJ    2. How are you currently taking this medication (dosage and times per day)?   Inject 140 mg into the skin every 14 (fourteen) days.    3. Are you having a reaction (difficulty breathing--STAT)? No  4. What is your medication issue?  Pharmacy calling to f/u on Prior Auth that was sent via fax on 09/23/22. Please advise

## 2022-09-25 NOTE — Telephone Encounter (Signed)
Pharmacy Patient Advocate Encounter  Prior Authorization for REPATHA has been approved.    PA# [id number] Effective dates: 09/25/22 through 09/25/23  Haze Rushing, CPhT Pharmacy Patient Advocate Specialist Direct Number: [phone] Fax: [phone]

## 2022-09-25 NOTE — Telephone Encounter (Signed)
Dr. Kirke Corin,  You saw this patient on 08/06/2022. Will you please comment on medical clearance for colonoscopy/EGD? Plavix will need to be held 5 days prior.   Please route your response to P CV DIV Preop. I will communicate with requesting office once you have given recommendations.   Thank you!  Carlos Levering, NP

## 2022-09-25 NOTE — Telephone Encounter (Signed)
Routed to Rx Prior Auth team

## 2022-09-25 NOTE — Telephone Encounter (Signed)
Pending, please see separate encounter with updates on determination 

## 2022-09-26 DIAGNOSIS — E1169 Type 2 diabetes mellitus with other specified complication: Secondary | ICD-10-CM

## 2022-09-26 NOTE — Telephone Encounter (Signed)
   Patient Name: Zachary Lewis  DOB: 1960/12/09 MRN: [id number]  Primary Cardiologist: None  Chart reviewed as part of pre-operative protocol coverage. Pre-op clearance already addressed by colleagues in earlier phone notes. To summarize recommendations:  -He is at low risk from a cardiac standpoint. Hold Plavix 5 days before.  Please restart when medically safe to do so. -Dr. Kirke Corin  Will route this bundled recommendation to requesting provider via Epic fax function and remove from pre-op pool. Please call with questions.  Sharlene Dory, PA-C 09/26/2022, 1:12 PM

## 2022-09-26 NOTE — Telephone Encounter (Signed)
He is at low risk from a cardiac standpoint.  Hold Plavix 5 days before.

## 2022-09-26 NOTE — Telephone Encounter (Signed)
ERROR

## 2022-09-27 ENCOUNTER — Other Ambulatory Visit (INDEPENDENT_AMBULATORY_CARE_PROVIDER_SITE_OTHER): Payer: No Typology Code available for payment source

## 2022-09-27 DIAGNOSIS — E1165 Type 2 diabetes mellitus with hyperglycemia: Secondary | ICD-10-CM

## 2022-09-27 DIAGNOSIS — R188 Other ascites: Secondary | ICD-10-CM

## 2022-09-27 DIAGNOSIS — K7460 Unspecified cirrhosis of liver: Secondary | ICD-10-CM

## 2022-09-27 LAB — CBC
HCT: 45.7 % (ref 39.0–52.0)
Hemoglobin: 15.6 g/dL (ref 13.0–17.0)
MCHC: 34.2 g/dL (ref 30.0–36.0)
MCV: 101 fl — ABNORMAL HIGH (ref 78.0–100.0)
Platelets: 237 10*3/uL (ref 150.0–400.0)
RBC: 4.52 Mil/uL (ref 4.22–5.81)
RDW: 13.3 % (ref 11.5–15.5)
WBC: 4.6 10*3/uL (ref 4.0–10.5)

## 2022-09-27 LAB — COMPREHENSIVE METABOLIC PANEL
ALT: 49 U/L (ref 0–53)
AST: 44 U/L — ABNORMAL HIGH (ref 0–37)
Albumin: 4.5 g/dL (ref 3.5–5.2)
Alkaline Phosphatase: 147 U/L — ABNORMAL HIGH (ref 39–117)
BUN: 16 mg/dL (ref 6–23)
CO2: 26 mEq/L (ref 19–32)
Calcium: 10 mg/dL (ref 8.4–10.5)
Chloride: 99 mEq/L (ref 96–112)
Creatinine, Ser: 0.76 mg/dL (ref 0.40–1.50)
GFR: 95.94 mL/min (ref >60.00)
Glucose, Bld: 270 mg/dL — ABNORMAL HIGH (ref 70–99)
Potassium: 4.1 mEq/L (ref 3.5–5.1)
Sodium: 135 mEq/L (ref 135–145)
Total Bilirubin: 0.4 mg/dL (ref 0.2–1.2)
Total Protein: 7.9 g/dL (ref 6.0–8.3)

## 2022-09-27 LAB — HEMOGLOBIN A1C: Hgb A1c MFr Bld: 11 % — ABNORMAL HIGH (ref 4.6–6.5)

## 2022-09-27 LAB — LIPID PANEL
Cholesterol: 222 mg/dL — ABNORMAL HIGH (ref 0–200)
HDL: 54.6 mg/dL (ref >39.00)
LDL Cholesterol: 150 mg/dL — ABNORMAL HIGH (ref 0–99)
NonHDL: 167.8
Total CHOL/HDL Ratio: 4
Triglycerides: 90 mg/dL (ref 0.0–149.0)
VLDL: 18 mg/dL (ref 0.0–40.0)

## 2022-09-27 NOTE — Telephone Encounter (Signed)
Zachary Lewis informed to hold his clopidogrel 5 days prior to [facility] so last dose on 11/19/2022. He verbalized understanding.

## 2022-09-30 LAB — AFP TUMOR MARKER: AFP-Tumor Marker: 3.9 ng/mL (ref <6.1)

## 2022-10-02 ENCOUNTER — Ambulatory Visit: Payer: No Typology Code available for payment source | Admitting: "Endocrinology

## 2022-10-04 ENCOUNTER — Other Ambulatory Visit: Payer: Self-pay | Admitting: Internal Medicine

## 2022-10-04 DIAGNOSIS — K7460 Unspecified cirrhosis of liver: Secondary | ICD-10-CM

## 2022-10-10 ENCOUNTER — Ambulatory Visit (INDEPENDENT_AMBULATORY_CARE_PROVIDER_SITE_OTHER): Payer: No Typology Code available for payment source | Admitting: "Endocrinology

## 2022-10-10 ENCOUNTER — Encounter: Payer: Self-pay | Admitting: "Endocrinology

## 2022-10-10 VITALS — BP 146/80 | HR 99 | Ht 67.0 in | Wt 199.4 lb

## 2022-10-10 DIAGNOSIS — E1165 Type 2 diabetes mellitus with hyperglycemia: Secondary | ICD-10-CM | POA: Diagnosis not present

## 2022-10-10 DIAGNOSIS — E7800 Pure hypercholesterolemia, unspecified: Secondary | ICD-10-CM

## 2022-10-10 DIAGNOSIS — Z7984 Long term (current) use of oral hypoglycemic drugs: Secondary | ICD-10-CM | POA: Diagnosis not present

## 2022-10-10 MED ORDER — LANTUS SOLOSTAR 100 UNIT/ML ~~LOC~~ SOPN: 10.0000 [IU] | PEN_INJECTOR | Freq: Every day | SUBCUTANEOUS | 1 refills | Status: DC

## 2022-10-10 MED ORDER — DEXCOM G7 SENSOR MISC: 1.0000 | 0 refills | Status: DC

## 2022-10-10 MED ORDER — SEMAGLUTIDE(0.25 OR 0.5MG/DOS) 2 MG/3ML ~~LOC~~ SOPN: 0.2500 mg | PEN_INJECTOR | SUBCUTANEOUS | 0 refills | Status: DC

## 2022-10-10 NOTE — Progress Notes (Signed)
Outpatient Endocrinology Note Altamese [place], MD  10/10/22   Zachary Lewis 1960-08-05 [id number]  Referring Provider: Judy Pimple, MD Primary Care Provider: Judy Pimple, MD Reason for consultation: Subjective   Assessment & Plan  Diagnoses and all orders for this visit:  Uncontrolled type 2 diabetes mellitus with hyperglycemia (HCC) -     Ambulatory referral to diabetic education -     Microalbumin / creatinine urine ratio; Future  Long term (current) use of oral hypoglycemic drugs  Pure hypercholesterolemia  Other orders -     insulin glargine (LANTUS SOLOSTAR) 100 UNIT/ML Solostar Pen; Inject 10 Units into the skin daily. Plus sliding scale, max dose 50 units a day -     Semaglutide,0.25 or 0.5MG /DOS, 2 MG/3ML SOPN; Inject 0.25 mg into the skin once a week. -     Continuous Glucose Sensor (DEXCOM G7 SENSOR) MISC; 1 Device by Does not apply route continuous.    Diabetes complicated by neuropathy Hba1c goal less than 7.0, current Hba1c is 11. Will recommend for the following change of medications to: Start Lantus 10 units once every morning. Increase by 1 units a every day until fasting blood sugar is less than 150. Stay on that dose.   Start ozempic 0.25 mg/wk Stop rybelsus 3 mg wkly Sent dexcom  No known contraindications to any of above medications No history of MEN syndrome/medullary thyroid cancer/pancreatitis or pancreatic cancer in self or family  Hyperlipidemia -Last LDL off goal: 150 -not on statin -On repatha 140 gm every 2 wks -Follow low fat diet and exercise    -Blood pressure goal <140/90 - Microalbumin/creatinine at goal < 30 three yrs ago-did not do lab, reordered  -not on ACE/ARB  -diet changes including salt restriction -limit eating outside -counseled BP targets per standards of diabetes care -Uncontrolled blood pressure can lead to retinopathy, nephropathy and cardiovascular and atherosclerotic heart disease  Reviewed and  counseled on: -A1C target -Blood sugar targets -Complications of uncontrolled diabetes  -Checking blood sugar before meals and bedtime and bring log next visit -All medications with mechanism of action and side effects -Hypoglycemia management: rule of 15's, Glucagon Emergency Kit and medical alert ID -low-carb low-fat plate-method diet -At least 20 minutes of physical activity per day -Annual dilated retinal eye exam and foot exam -compliance and follow up needs -follow up as scheduled or earlier if problem gets worse  Call if blood sugar is less than 70 or consistently above 250    Take a 15 gm snack of carbohydrate at bedtime before you go to sleep if your blood sugar is less than 100.    If you are going to fast after midnight for a test or procedure, ask your physician for instructions on how to reduce/decrease your insulin dose.    Call if blood sugar is less than 70 or consistently above 250  -Treating a low sugar by rule of 15  (15 gms of sugar every 15 min until sugar is more than 70) If you feel your sugar is low, test your sugar to be sure If your sugar is low (less than 70), then take 15 grams of a fast acting Carbohydrate (3-4 glucose tablets or glucose gel or 4 ounces of juice or regular soda) Recheck your sugar 15 min after treating low to make sure it is more than 70 If sugar is still less than 70, treat again with 15 grams of carbohydrate  Don't drive the hour of hypoglycemia  If unconscious/unable to eat or drink by mouth, use glucagon injection or nasal spray baqsimi and call 911. Can repeat again in 15 min if still unconscious.  Return in about 5 weeks (around 11/14/2022).   I have reviewed current medications, nurse's notes, allergies, vital signs, past medical and surgical history, family medical history, and social history for this encounter. Counseled patient on symptoms, examination findings, lab findings, imaging results, treatment decisions and  monitoring and prognosis. The patient understood the recommendations and agrees with the treatment plan. All questions regarding treatment plan were fully answered.  Altamese [place], MD  10/10/22    History of Present Illness Zachary Lewis is a 62 y.o. year old male who presents for evaluation of Type 2 diabetes mellitus.  Zachary Lewis was first diagnosed in 2010.   Diabetes education +  Home diabetes regimen: None  Took rybelsus in the past  COMPLICATIONS -  MI/Stroke -  retinopathym +  neuropathy -  nephropathy  BLOOD SUGAR DATA Has meter, did not bring  Doesn't check BG  Physical Exam  BP (!) 146/80 (BP Location: Right Arm, Patient Position: Sitting, Cuff Size: Normal)   Pulse 99   Ht 5\' 7"  (1.702 m)   Wt 199 lb 6.4 oz (90.4 kg)   SpO2 99%   BMI 31.23 kg/m    Constitutional: well developed, well nourished Head: normocephalic, atraumatic Eyes: sclera anicteric, no redness Neck: supple Lungs: normal respiratory effort Neurology: alert and oriented Skin: dry, no appreciable rashes Musculoskeletal: no appreciable defects Psychiatric: normal mood and affect Diabetic Foot Exam - Simple   Simple Foot Form Diabetic Foot exam was performed with the following findings: Yes 10/10/2022 10:33 AM  Visual Inspection No deformities, no ulcerations, no other skin breakdown bilaterally: Yes Sensation Testing Intact to touch and monofilament testing bilaterally: Yes Pulse Check Posterior Tibialis and Dorsalis pulse intact bilaterally: Yes Comments Uncontrolled type 2 DM with neuropathy, numerous calluses and potential toenail onychomycosis      Current Medications Patient's Medications  New Prescriptions   CONTINUOUS GLUCOSE SENSOR (DEXCOM G7 SENSOR) MISC    1 Device by Does not apply route continuous.   INSULIN GLARGINE (LANTUS SOLOSTAR) 100 UNIT/ML SOLOSTAR PEN    Inject 10 Units into the skin daily. Plus sliding scale, max dose 50 units a day   SEMAGLUTIDE,0.25  OR 0.5MG /DOS, 2 MG/3ML SOPN    Inject 0.25 mg into the skin once a week.  Previous Medications   ASPIRIN EC 81 MG TABLET    Take 1 tablet (81 mg total) by mouth daily. Swallow whole.   CLOPIDOGREL (PLAVIX) 75 MG TABLET    Take 1 tablet (75 mg total) by mouth daily.   EVOLOCUMAB (REPATHA SURECLICK) 140 MG/ML SOAJ    Inject 140 mg into the skin every 14 (fourteen) days.   GLUCOSE BLOOD (ONETOUCH VERIO) TEST STRIP    1 each by Other route daily. And lancets 1/day   LATANOPROST (XALATAN) 0.005 % OPHTHALMIC SOLUTION    SMARTSIG:In Eye(s)   SPIRONOLACTONE (ALDACTONE) 50 MG TABLET    Take 1 tablet (50 mg total) by mouth daily.   TADALAFIL (CIALIS) 20 MG TABLET    Take 1 tablet (20 mg total) by mouth as needed.   TADALAFIL (CIALIS) 20 MG TABLET    Take 1 tablet (20 mg total) by mouth daily as needed.   THIAMINE 100 MG TABLET    Take 1 tablet (100 mg total) by mouth daily.  Modified Medications   No medications on file  Discontinued Medications   SEMAGLUTIDE (RYBELSUS) 3 MG TABS    Take 3 mg by mouth daily.    Allergies No Known Allergies  Past Medical History Past Medical History:  Diagnosis Date   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) 08/04/2018   Diabetes mellitus    Type II   Hyperlipidemia    Hypertension    NSAID-induced gastric ulcer 12/13/2018   EGD 11/2018 H pylori neg Omeprazole started   Obesity    Personal history of colonic adenomas 03/09/2013   Tinea pedis    Chronic    Past Surgical History Past Surgical History:  Procedure Laterality Date   COLONOSCOPY     COLONOSCOPY W/ POLYPECTOMY  2014   IR PARACENTESIS  07/02/2018   IR TRANSCATHETER BX  07/17/2018   IR VENOGRAM HEPATIC WO HEMODYNAMIC EVALUATION  07/17/2018   myringectomy  06/2005   POLYPECTOMY     UMBILICAL HERNIA REPAIR  01/1997   VASECTOMY  07/2002    Family History family history includes Hypertension in an other family member.  Social History Social History   Socioeconomic History    Marital status: Married    Spouse name: Not on file   Number of children: 5   Years of education: Not on file   Highest education level: Not on file  Occupational History   Occupation: [employer]    Employer: [employer]   Occupation: [employer]  Tobacco Use   Smoking status: Light Smoker    Packs/day: .25    Types: Cigarettes   Smokeless tobacco: Never  Vaping Use   Vaping Use: Never used  Substance and Sexual Activity   Alcohol use: Yes    Comment: Heavy use of [place] mist daily most of the time/ stopped drinking 05-2018    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 [employer]   Smoker, heavy drinking of [place] mist alcohol in past   No drug use   Social Determinants of Health   Financial Resource Strain: Not on file  Food Insecurity: Not on file  Transportation Needs: Not on file  Physical Activity: Not on file  Stress: Not on file  Social Connections: Not on file  Intimate Partner Violence: Not on file    Lab Results  Component Value Date   HGBA1C 11.0 (H) 09/27/2022   HGBA1C 9.7 (H) 02/01/2022   HGBA1C 9.6 (A) 01/26/2021   Lab Results  Component Value Date   CHOL 222 (H) 09/27/2022   Lab Results  Component Value Date   HDL 54.60 09/27/2022   Lab Results  Component Value Date   LDLCALC 150 (H) 09/27/2022   Lab Results  Component Value Date   TRIG 90.0 09/27/2022   Lab Results  Component Value Date   CHOLHDL 4 09/27/2022   Lab Results  Component Value Date   CREATININE 0.76 09/27/2022   Lab Results  Component Value Date   GFR 95.94 09/27/2022   Lab Results  Component Value Date   MICROALBUR 3.5 (H) 02/15/2019      Component Value Date/Time   NA 135 09/27/2022 0823   K 4.1 09/27/2022 0823   CL 99 09/27/2022 0823   CO2 26 09/27/2022 0823   GLUCOSE 270 (H) 09/27/2022 0823   BUN 16 09/27/2022  1610   CREATININE 0.76  09/27/2022 0823   CALCIUM 10.0 09/27/2022 0823   PROT 7.9 09/27/2022 0823   ALBUMIN 4.5 09/27/2022 0823   AST 44 (H) 09/27/2022 0823   ALT 49 09/27/2022 0823   ALKPHOS 147 (H) 09/27/2022 0823   BILITOT 0.4 09/27/2022 0823   GFRNONAA >60 06/01/2018 0427   GFRAA >60 06/01/2018 0427      Latest Ref Rng & Units 09/27/2022    8:23 AM 02/01/2022    8:16 AM 10/31/2020    7:38 AM  BMP  Glucose 70 - 99 mg/dL 960  454  098   BUN 6 - 23 mg/dL 16  19  15    Creatinine 0.40 - 1.50 mg/dL 1.19  1.47  8.29   Sodium 135 - 145 mEq/L 135  136  135   Potassium 3.5 - 5.1 mEq/L 4.1  4.7  4.3   Chloride 96 - 112 mEq/L 99  99  99   CO2 19 - 32 mEq/L 26  21  25    Calcium 8.4 - 10.5 mg/dL 56.2  13.0  9.9        Component Value Date/Time   WBC 4.6 09/27/2022 0823   RBC 4.52 09/27/2022 0823   HGB 15.6 09/27/2022 0823   HCT 45.7 09/27/2022 0823   PLT 237.0 09/27/2022 0823   MCV 101.0 (H) 09/27/2022 0823   MCH 36.3 (H) 07/17/2018 0730   MCHC 34.2 09/27/2022 0823   RDW 13.3 09/27/2022 0823   LYMPHSABS 1.5 02/01/2022 0816   MONOABS 0.5 02/01/2022 0816   EOSABS 0.6 02/01/2022 0816   BASOSABS 0.1 02/01/2022 0816     Parts of this note may have been dictated using voice recognition software. There may be variances in spelling and vocabulary which are unintentional. Not all errors are proofread. Please notify the Thereasa Parkin if any discrepancies are noted or if the meaning of any statement is not clear.

## 2022-10-18 ENCOUNTER — Other Ambulatory Visit (HOSPITAL_COMMUNITY): Payer: Self-pay

## 2022-10-21 ENCOUNTER — Ambulatory Visit (INDEPENDENT_AMBULATORY_CARE_PROVIDER_SITE_OTHER): Payer: No Typology Code available for payment source | Admitting: Podiatry

## 2022-10-21 DIAGNOSIS — E0843 Diabetes mellitus due to underlying condition with diabetic autonomic (poly)neuropathy: Secondary | ICD-10-CM

## 2022-10-21 DIAGNOSIS — M79675 Pain in left toe(s): Secondary | ICD-10-CM | POA: Diagnosis not present

## 2022-10-21 DIAGNOSIS — M79674 Pain in right toe(s): Secondary | ICD-10-CM | POA: Diagnosis not present

## 2022-10-21 DIAGNOSIS — D2372 Other benign neoplasm of skin of left lower limb, including hip: Secondary | ICD-10-CM

## 2022-10-21 DIAGNOSIS — B351 Tinea unguium: Secondary | ICD-10-CM | POA: Diagnosis not present

## 2022-10-21 MED ORDER — CLOTRIMAZOLE-BETAMETHASONE 1-0.05 % EX CREA: 1.0000 | TOPICAL_CREAM | Freq: Every day | CUTANEOUS | 2 refills | Status: DC

## 2022-10-21 NOTE — Progress Notes (Signed)
Chief Complaint  Patient presents with   Callouses    Patient came in today for Diabetic foot care bilateral callus and nail trim, started 6 years ago, rate of pain 9 out of 10,  A1c-11.2 BG- 231    SUBJECTIVE Patient with a history of diabetes mellitus presents to office today complaining of elongated, thickened nails that cause pain while ambulating in shoes.  Patient is unable to trim their own nails.  Patient also has symptomatic calluses to the bilateral feet.  Patient is here for further evaluation and treatment.  Past Medical History:  Diagnosis Date   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) 08/04/2018   Diabetes mellitus    Type II   Hyperlipidemia    Hypertension    NSAID-induced gastric ulcer 12/13/2018   EGD 11/2018 H pylori neg Omeprazole started   Obesity    Personal history of colonic adenomas 03/09/2013   Tinea pedis    Chronic    No Known Allergies   OBJECTIVE General Patient is awake, alert, and oriented x 3 and in no acute distress. Derm Skin is dry and supple bilateral. Negative open lesions or macerations. Remaining integument unremarkable. Nails are tender, long, thickened and dystrophic with subungual debris, consistent with onychomycosis, 1-5 bilateral. No signs of infection noted.  Hyperkeratotic benign skin lesions noted to the bilateral feet Vasc  VAS [place] LOWER EXT ART SEG MULTI 02/12/2021 ABI Findings:  +---------+------------------+-----+---------+--------+  Right   Rt Pressure (mmHg)IndexWaveform Comment   +---------+------------------+-----+---------+--------+  Brachial 138                                       +---------+------------------+-----+---------+--------+  CFA                            triphasic          +---------+------------------+-----+---------+--------+  Popliteal                      triphasic          +---------+------------------+-----+---------+--------+  PTA                             absent             +---------+------------------+-----+---------+--------+  PERO                           triphasic          +---------+------------------+-----+---------+--------+  DP      112               0.81 biphasic           +---------+------------------+-----+---------+--------+  Great Toe69                0.50 Abnormal Mildly    +---------+------------------+-----+---------+--------+   +---------+------------------+-----+----------+----------+  Left    Lt Pressure (mmHg)IndexWaveform  Comment     +---------+------------------+-----+----------+----------+  Brachial 137                                          +---------+------------------+-----+----------+----------+  CFA  triphasic             +---------+------------------+-----+----------+----------+  Popliteal                      triphasic             +---------+------------------+-----+----------+----------+  PTA                            absent                +---------+------------------+-----+----------+----------+  PERO                           monophasic            +---------+------------------+-----+----------+----------+  DP      89                0.64 monophasic            +---------+------------------+-----+----------+----------+  Great Toe69                0.50 Abnormal  Moderately  +---------+------------------+-----+----------+----------+   +-------+-----------+-----------+------------+------------+  ABI/TBIToday's ABIToday's TBIPrevious ABIPrevious TBI  +-------+-----------+-----------+------------+------------+  Right .81        .50                                  +-------+-----------+-----------+------------+------------+  Left  .72        .50                                  +-------+-----------+-----------+------------+------------+  Summary:  Right: Resting right ankle-brachial  index indicates mild right lower  extremity arterial disease. The right toe-brachial index is abnormal.   Left: Resting left ankle-brachial index indicates moderate left lower  extremity arterial disease. The left toe-brachial index is abnormal.  Neuro light touch and protective threshold sensation diminished bilaterally.  Musculoskeletal Exam No symptomatic pedal deformities noted bilateral. Muscular strength within normal limits.  ASSESSMENT 1. 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  Pain due to onychomycosis of toenails bilateral 3.  Benign hyperkeratotic skin lesions bilateral  PLAN OF CARE -Patient evaluated today.  Comprehensive diabetic foot exam performed today.  Stressed the importance of working closely with his PCP for better diabetes management -Instructed to maintain good pedal hygiene and foot care. Stressed importance of controlling blood sugar.  -Mechanical debridement of nails 1-5 bilaterally performed using a nail nipper. Filed with dremel without incident.  -Excisional debridement of the hyperkeratotic skin lesions was performed today using a 312 scalpel without incident or bleeding.  Salicylic acid and a light Band-Aid applied. -Return to clinic in 3 months    Felecia Shelling, DPM [facility]  Dr. Felecia Shelling, DPM    [address] [place], [place] [address]                Office [phone]  Fax [phone]

## 2022-11-11 ENCOUNTER — Encounter: Payer: Self-pay | Admitting: Internal Medicine

## 2022-11-15 ENCOUNTER — Ambulatory Visit: Payer: No Typology Code available for payment source | Admitting: "Endocrinology

## 2022-11-25 ENCOUNTER — Encounter: Payer: Self-pay | Admitting: Internal Medicine

## 2022-11-25 ENCOUNTER — Other Ambulatory Visit (INDEPENDENT_AMBULATORY_CARE_PROVIDER_SITE_OTHER): Payer: No Typology Code available for payment source

## 2022-11-25 ENCOUNTER — Ambulatory Visit (AMBULATORY_SURGERY_CENTER): Payer: No Typology Code available for payment source | Admitting: Internal Medicine

## 2022-11-25 VITALS — BP 107/68 | HR 69 | Temp 98.2°F | Resp 22 | Ht 67.0 in | Wt 197.0 lb

## 2022-11-25 DIAGNOSIS — R188 Other ascites: Secondary | ICD-10-CM

## 2022-11-25 DIAGNOSIS — Z86010 Personal history of colonic polyps: Secondary | ICD-10-CM | POA: Diagnosis not present

## 2022-11-25 DIAGNOSIS — Z09 Encounter for follow-up examination after completed treatment for conditions other than malignant neoplasm: Secondary | ICD-10-CM

## 2022-11-25 DIAGNOSIS — K5520 Angiodysplasia of colon without hemorrhage: Secondary | ICD-10-CM

## 2022-11-25 DIAGNOSIS — D124 Benign neoplasm of descending colon: Secondary | ICD-10-CM

## 2022-11-25 DIAGNOSIS — K766 Portal hypertension: Secondary | ICD-10-CM | POA: Diagnosis not present

## 2022-11-25 DIAGNOSIS — K3189 Other diseases of stomach and duodenum: Secondary | ICD-10-CM

## 2022-11-25 DIAGNOSIS — K7460 Unspecified cirrhosis of liver: Secondary | ICD-10-CM

## 2022-11-25 HISTORY — DX: Angiodysplasia of colon without hemorrhage: K55.20

## 2022-11-25 LAB — PROTIME-INR
INR: 1.1 ratio — ABNORMAL HIGH (ref 0.8–1.0)
Prothrombin Time: 11.7 s (ref 9.6–13.1)

## 2022-11-25 MED ORDER — SODIUM CHLORIDE 0.9 % IV SOLN
500.0000 mL | Freq: Once | INTRAVENOUS | Status: DC
Start: 2022-11-25 — End: 2022-11-25

## 2022-11-25 NOTE — Progress Notes (Signed)
Sedate, gd SR, tolerated procedure well, VSS, report to RN 

## 2022-11-25 NOTE — Progress Notes (Signed)
Pt's states no medical or surgical changes since previsit or office visit. 

## 2022-11-25 NOTE — Patient Instructions (Addendum)
Please read handouts provided. Continue present medications. Resume Plavix ( clopidogrel ) at prior dose tomorrow. Await pathology results.  YOU HAD AN ENDOSCOPIC PROCEDURE TODAY AT THE  ENDOSCOPY CENTER:   Refer to the procedure report that was given to you for any specific questions about what was found during the examination.  If the procedure report does not answer your questions, please call your gastroenterologist to clarify.  If you requested that your care partner not be given the details of your procedure findings, then the procedure report has been included in a sealed envelope for you to review at your convenience later.  YOU SHOULD EXPECT: Some feelings of bloating in the abdomen. Passage of more gas than usual.  Walking can help get rid of the air that was put into your GI tract during the procedure and reduce the bloating. If you had a lower endoscopy (such as a colonoscopy or flexible sigmoidoscopy) you may notice spotting of blood in your stool or on the toilet paper. If you underwent a bowel prep for your procedure, you may not have a normal bowel movement for a few days.  Please Note:  You might notice some irritation and congestion in your nose or some drainage.  This is from the oxygen used during your procedure.  There is no need for concern and it should clear up in a day or so.  SYMPTOMS TO REPORT IMMEDIATELY:  Following lower endoscopy (colonoscopy or flexible sigmoidoscopy):  Excessive amounts of blood in the stool  Significant tenderness or worsening of abdominal pains  Swelling of the abdomen that is new, acute  Fever of 100F or higher  Following upper endoscopy (EGD)  Vomiting of blood or coffee ground material  New chest pain or pain under the shoulder blades  Painful or persistently difficult swallowing  New shortness of breath  Fever of 100F or higher  Black, tarry-looking stools  For urgent or emergent issues, a gastroenterologist can be reached at  any hour by calling [phone]. Do not use MyChart messaging for urgent concerns.    DIET:  We do recommend a small meal at first, but then you may proceed to your regular diet.  Drink plenty of fluids but you should avoid alcoholic beverages for 24 hours.  ACTIVITY:  You should plan to take it easy for the rest of today and you should NOT DRIVE or use heavy machinery until tomorrow (because of the sedation medicines used during the test).    FOLLOW UP: Our staff will call the number listed on your records the next business day following your procedure.  We will call around 7:15- 8:00 am to check on you and address any questions or concerns that you may have regarding the information given to you following your procedure. If we do not reach you, we will leave a message.     If any biopsies were taken you will be contacted by phone or by letter within the next 1-3 weeks.  Please call us at [phone] if you have not heard about the biopsies in 3 weeks.    SIGNATURES/CONFIDENTIALITY: You and/or your care partner have signed paperwork which will be entered into your electronic medical record.  These signatures attest to the fact that that the information above on your After Visit Summary has been reviewed and is understood.  Full responsibility of the confidentiality of this discharge information lies with you and/or your care-partner.Two colon polyps remove today. Also an abnormal collection of surface blood  vessels in colon called angiodysplasia or AVM. Not usually a problem.   Mild stomach lining changes seen also - related to cirrhosis.  I will have you get some labs today and my office will schedule ultrasound of liver,.  I will let you know pathology results and when to have another routine colonoscopy by mail and/or My Chart.  Restart clopdiogrel (Plavix) tomorrow.  I appreciate the opportunity to care for you. Iva Boop, MD, [facility]

## 2022-11-25 NOTE — Progress Notes (Signed)
Called to room to assist during endoscopic procedure.  Patient ID and intended procedure confirmed with present staff. Received instructions for my participation in the procedure from the performing physician.  

## 2022-11-25 NOTE — Op Note (Signed)
[facility] Patient Name: Zachary Lewis Procedure Date: 11/25/2022 10:32 AM MRN: [id number] Endoscopist: Iva Boop , MD, [id number] Age: 62 Referring MD:  Date of Birth: 1960-12-28 Gender: Male Account #: [id number] Procedure:                Colonoscopy Indications:              Last colonoscopy: 2020 Medicines:                Monitored Anesthesia Care Procedure:                Pre-Anesthesia Assessment:                           - Prior to the procedure, a History and Physical                            was performed, and patient medications and                            allergies were reviewed. The patient's tolerance of                            previous anesthesia was also reviewed. The risks                            and benefits of the procedure and the sedation                            options and risks were discussed with the patient.                            All questions were answered, and informed consent                            was obtained. Prior Anticoagulants: The patient                            last took Plavix (clopidogrel) 5 days prior to the                            procedure. ASA Grade Assessment: III - A patient                            with severe systemic disease. After reviewing the                            risks and benefits, the patient was deemed in                            satisfactory condition to undergo the procedure.                           After obtaining informed consent, the colonoscope  was passed under direct vision. Throughout the                            procedure, the patient's blood pressure, pulse, and                            oxygen saturations were monitored continuously. The                            CF [id number] #[id number] was introduced through the anus                            and advanced to the the cecum, identified by                            appendiceal orifice and  ileocecal valve. The                            colonoscopy was performed without difficulty. The                            patient tolerated the procedure well. The quality                            of the bowel preparation was good. The ileocecal                            valve, appendiceal orifice, and rectum were                            photographed. The bowel preparation used was                            Miralax via split dose instruction. Scope In: 10:46:11 AM Scope Out: 11:01:28 AM Scope Withdrawal Time: 0 hours 12 minutes 16 seconds  Total Procedure Duration: 0 hours 15 minutes 17 seconds  Findings:                 The perianal and digital rectal examinations were                            normal.                           Two sessile polyps were found in the proximal                            descending colon. The polyps were diminutive in                            size. These polyps were removed with a cold snare.                            Resection and retrieval were complete. Verification  of patient identification for the specimen was                            done. Estimated blood loss was minimal.                           A single small angiodysplastic lesion without                            bleeding was found in the cecum.                           Multiple diverticula were found in the sigmoid                            colon.                           The exam was otherwise without abnormality on                            direct and retroflexion views. Complications:            No immediate complications. Estimated Blood Loss:     Estimated blood loss was minimal. Impression:               - Two diminutive polyps in the proximal descending                            colon, removed with a cold snare. Resected and                            retrieved.                           - A single non-bleeding colonic angiodysplastic                             lesion.                           - Diverticulosis in the sigmoid colon.                           - The examination was otherwise normal on direct                            and retroflexion views.                           - Personal history of colonic polyps. 02/2013 - 5                            tubular adenomas max 7 mm                           7/20202 5 diminutive adenomas Recommendation:           -  Patient has a contact number available for                            emergencies. The signs and symptoms of potential                            delayed complications were discussed with the                            patient. Return to normal activities tomorrow.                            Written discharge instructions were provided to the                            patient.                           - Continue present medications.                           - Resume Plavix (clopidogrel) at prior dose                            tomorrow.                           - Repeat colonoscopy is recommended for                            surveillance. The colonoscopy date will be                            determined after pathology results from today's                            exam become available for review. Iva Boop, MD 11/25/2022 11:19:30 AM This report has been signed electronically.

## 2022-11-25 NOTE — Progress Notes (Signed)
[facility] History and Physical   Primary Care Physician:  Tower, Audrie Gallus, MD   Reason for Procedure:   Screen for varices in cirrhosis and colon polyp surveillance  Plan:    EGD and colonoscopy     HPI: Zachary Lewis is a 62 y.o. male w/ MetALD cirrhosis and prior colon polyps. Seen 09/24/22 and this appointment was made. Also w/ hx gastric ulcer and portal gastropathy at EGD 2020.  Colon polyp history:   02/2013 - 5 tubular adenomas max 7 mm  12/08/2018 5 diminutive adenomas    EGD 12/08/2018    - Non-bleeding gastric ulcer with pigmented                            material. Biopsied.                           - Gastritis. Biopsied.                           - Portal hypertensive gastropathy.                           - The examination was otherwise normal   1. Surgical [P], gastric antrum - ULCERATED REACTIVE GASTROPATHY - NO H. PYLORI OR INTESTINAL METAPLASIA IDENTIFIED - SEE COMMENT 2. Surgical [P], gastric antrum - MILD CHRONIC GASTRITIS WITHOUT ACTIVITY - NO H. PYLORI OR INTESTINAL METAPLASIA IDENTIFIED      Past Medical History:  Diagnosis Date   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) 08/04/2018   Diabetes mellitus    Type II   Hyperlipidemia    Hypertension    NSAID-induced gastric ulcer 12/13/2018   EGD 11/2018 H pylori neg Omeprazole started   Obesity    Personal history of colonic adenomas 03/09/2013   Tinea pedis    Chronic    Past Surgical History:  Procedure Laterality Date   COLONOSCOPY     COLONOSCOPY W/ POLYPECTOMY  2014   IR PARACENTESIS  07/02/2018   IR TRANSCATHETER BX  07/17/2018   IR VENOGRAM HEPATIC WO HEMODYNAMIC EVALUATION  07/17/2018   myringectomy  06/2005   POLYPECTOMY     UMBILICAL HERNIA REPAIR  01/1997   VASECTOMY  07/2002    Prior to Admission medications   Medication Sig Start Date End Date Taking? Authorizing Provider  clotrimazole-betamethasone (LOTRISONE) cream Apply 1 Application  topically daily. 10/21/22  Yes Felecia Shelling, DPM  spironolactone (ALDACTONE) 50 MG tablet Take 1 tablet (50 mg total) by mouth daily. 02/08/22  Yes Tower, Audrie Gallus, MD  aspirin EC 81 MG tablet Take 1 tablet (81 mg total) by mouth daily. Swallow whole. 03/27/21   Iran Ouch, MD  clopidogrel (PLAVIX) 75 MG tablet Take 1 tablet (75 mg total) by mouth daily. 07/24/21   Iran Ouch, MD  Continuous Glucose Sensor (DEXCOM G7 SENSOR) MISC 1 Device by Does not apply route continuous. 10/10/22   Motwani, Carin Hock, MD  Evolocumab (REPATHA SURECLICK) 140 MG/ML SOAJ Inject 140 mg into the skin every 14 (fourteen) days. 09/23/22   Iran Ouch, MD  glucose blood (ONETOUCH VERIO) test strip 1 each by Other route daily. And lancets 1/day 01/26/21   Romero Belling, MD  insulin glargine (LANTUS SOLOSTAR) 100 UNIT/ML Solostar Pen Inject 10 Units into the  skin daily. Plus sliding scale, max dose 50 units a day 10/10/22   Altamese Bethel, MD  latanoprost (XALATAN) 0.005 % ophthalmic solution SMARTSIG:In Eye(s) 03/01/21   [provider]  Semaglutide,0.25 or 0.5MG /DOS, 2 MG/3ML SOPN Inject 0.25 mg into the skin once a week. 10/10/22   Motwani, Carin Hock, MD  tadalafil (CIALIS) 20 MG tablet Take 1 tablet (20 mg total) by mouth as needed. 01/07/20   Romero Belling, MD  tadalafil (CIALIS) 20 MG tablet Take 1 tablet (20 mg total) by mouth daily as needed. 12/27/21     thiamine 100 MG tablet Take 1 tablet (100 mg total) by mouth daily. 06/02/18   Leroy Sea, MD    Current Outpatient Medications  Medication Sig Dispense Refill   clotrimazole-betamethasone (LOTRISONE) cream Apply 1 Application topically daily. 45 g 2   spironolactone (ALDACTONE) 50 MG tablet Take 1 tablet (50 mg total) by mouth daily. 90 tablet 3   aspirin EC 81 MG tablet Take 1 tablet (81 mg total) by mouth daily. Swallow whole. 90 tablet 3   clopidogrel (PLAVIX) 75 MG tablet Take 1 tablet (75 mg total) by mouth daily. 90 tablet 3   Continuous  Glucose Sensor (DEXCOM G7 SENSOR) MISC 1 Device by Does not apply route continuous. 9 each 0   Evolocumab (REPATHA SURECLICK) 140 MG/ML SOAJ Inject 140 mg into the skin every 14 (fourteen) days. 6 mL 3   glucose blood (ONETOUCH VERIO) test strip 1 each by Other route daily. And lancets 1/day 100 each 3   insulin glargine (LANTUS SOLOSTAR) 100 UNIT/ML Solostar Pen Inject 10 Units into the skin daily. Plus sliding scale, max dose 50 units a day 45 mL 1   latanoprost (XALATAN) 0.005 % ophthalmic solution SMARTSIG:In Eye(s)     Semaglutide,0.25 or 0.5MG /DOS, 2 MG/3ML SOPN Inject 0.25 mg into the skin once a week. 3 mL 0   tadalafil (CIALIS) 20 MG tablet Take 1 tablet (20 mg total) by mouth as needed. 10 tablet 11   tadalafil (CIALIS) 20 MG tablet Take 1 tablet (20 mg total) by mouth daily as needed. 90 tablet 2   thiamine 100 MG tablet Take 1 tablet (100 mg total) by mouth daily. 30 tablet 0   Current Facility-Administered Medications  Medication Dose Route Frequency Provider Last Rate Last Admin   0.9 %  sodium chloride infusion  500 mL Intravenous Once Iva Boop, MD        Allergies as of 11/25/2022   (No Known Allergies)    Family History  Problem Relation Age of Onset   Hypertension Other    Diabetes Neg Hx    Heart disease Neg Hx    Colon cancer Neg Hx    Stomach cancer Neg Hx    Pancreatic disease Neg Hx    Colon polyps Neg Hx    Esophageal cancer Neg Hx    Rectal cancer Neg Hx     Social History   Socioeconomic History   Marital status: Married    Spouse name: Not on file   Number of children: 5   Years of education: Not on file   Highest education level: Not on file  Occupational History   Occupation: [employer]    Employer: [employer]   Occupation: [employer]  Tobacco Use   Smoking status: Light Smoker    Packs/day: .25    Types: Cigarettes   Smokeless tobacco: Never  Vaping Use   Vaping Use: Never  used  Substance and Sexual Activity    Alcohol use: Yes    Comment: 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  Physical Exam: Vital signs BP (!) 106/46   Pulse 82   Temp 98.2 F (36.8 C)   Ht 5\' 7"  (1.702 m)   Wt 197 lb (89.4 kg)   SpO2 97%   BMI 30.85 kg/m   General:   Alert,  Well-developed, well-nourished, pleasant and cooperative in NAD Upper dental implants intact but sl loose Lungs:  Clear throughout to auscultation.   Heart:  Regular rate and rhythm; no murmurs, clicks, rubs,  or gallops. Abdomen:  Soft, nontender and nondistended. Normal bowel sounds.   Neuro/Psych:  Alert and cooperative. Normal mood and affect. A and O x 3   @Joseth Weigel  Sena Slate, MD, [facility] [phone] (pager) 11/25/2022 10:31 AM@

## 2022-11-25 NOTE — Addendum Note (Signed)
Addended by: Elmarie Mainland on: 11/25/2022 11:56 AM   Modules accepted: Orders

## 2022-11-25 NOTE — Op Note (Addendum)
[facility] Patient Name: Zachary Lewis Procedure Date: 11/25/2022 10:33 AM MRN: [id number] Endoscopist: Iva Boop , MD, [id number] Age: 62 Referring MD:  Date of Birth: Mar 04, 1961 Gender: Male Account #: [id number] Procedure:                Upper GI endoscopy Indications:              Cirrhosis rule out esophageal varices Medicines:                Monitored Anesthesia Care Procedure:                Pre-Anesthesia Assessment:                           - Prior to the procedure, a History and Physical                            was performed, and patient medications and                            allergies were reviewed. The patient's tolerance of                            previous anesthesia was also reviewed. The risks                            and benefits of the procedure and the sedation                            options and risks were discussed with the patient.                            All questions were answered, and informed consent                            was obtained. Prior Anticoagulants: The patient                            last took Plavix (clopidogrel) 5 days prior to the                            procedure. ASA Grade Assessment: III - A patient                            with severe systemic disease. After reviewing the                            risks and benefits, the patient was deemed in                            satisfactory condition to undergo the procedure.                           After obtaining informed consent, the endoscope was  passed under direct vision. Throughout the                            procedure, the patient's blood pressure, pulse, and                            oxygen saturations were monitored continuously. The                            GIF [id number] #[id number] was introduced through the                            mouth, and advanced to the second part of duodenum.                            The  upper GI endoscopy was accomplished without                            difficulty. The patient tolerated the procedure                            well. Scope In: Scope Out: Findings:                 The examined esophagus was normal.                           Mild portal hypertensive gastropathy was found in                            the entire examined stomach.                           The exam was otherwise without abnormality.                           The cardia and gastric fundus were normal on                            retroflexion. Complications:            No immediate complications. Estimated Blood Loss:     Estimated blood loss: none. Impression:               - Normal esophagus.                           - Portal hypertensive gastropathy.                           - The examination was otherwise normal.                           - No specimens collected. Recommendation:           - Patient has a contact number available for  emergencies. The signs and symptoms of potential                            delayed complications were discussed with the                            patient. Return to normal activities tomorrow.                            Written discharge instructions were provided to the                            patient.                           - Resume previous diet.                           - Continue present medications.                           - See the other procedure note for documentation of                            additional recommendations.                           - Labs today (ordered) - INR, HAV Ab total and HBV                            Ab and cor Ab total                           OFFICE will order RUQ [place] re: Cirrhosis                           Further plans pending above                           recall EGD 1 yr given hx cirrhosis w/ ascites (?                            start carvdilol?)                            - See the other procedure note for documentation of                            additional recommendations. Iva Boop, MD 11/25/2022 11:13:10 AM This report has been signed electronically.

## 2022-11-26 ENCOUNTER — Telehealth: Payer: Self-pay | Admitting: *Deleted

## 2022-11-26 ENCOUNTER — Telehealth: Payer: Self-pay

## 2022-11-26 DIAGNOSIS — R188 Other ascites: Secondary | ICD-10-CM

## 2022-11-26 LAB — HEPATITIS A ANTIBODY, TOTAL: Hepatitis A AB,Total: NONREACTIVE

## 2022-11-26 LAB — HEPATITIS B CORE ANTIBODY, TOTAL: Hep B Core Total Ab: NONREACTIVE

## 2022-11-26 LAB — HEPATITIS B SURFACE ANTIBODY,QUALITATIVE: Hep B S Ab: NONREACTIVE

## 2022-11-26 NOTE — Telephone Encounter (Signed)
  Follow up Call-     11/25/2022   10:11 AM  Call back number  Post procedure Call Back phone  # [phone]  Permission to leave phone message Yes     Patient questions:  Do you have a fever, pain , or abdominal swelling? No. Pain Score  0 *  Have you tolerated food without any problems? Yes.    Have you been able to return to your normal activities? Yes.    Do you have any questions about your discharge instructions: Diet   No. Medications  No. Follow up visit  No.  Do you have questions or concerns about your Care? No.  Actions: * If pain score is 4 or above: No action needed, pain <4.

## 2022-11-26 NOTE — Telephone Encounter (Signed)
I spoke to Mr Demmer to see when he wants to do his U/S. He said he will call me back in the AM.

## 2022-11-27 ENCOUNTER — Other Ambulatory Visit: Payer: Self-pay | Admitting: Nurse Practitioner

## 2022-11-27 NOTE — Telephone Encounter (Signed)
I spoke with him again today and he is still checking his schedule about the U/S appointment. He is coming in Friday for vaccines and maybe he will let us know then good times for him.

## 2022-11-29 ENCOUNTER — Ambulatory Visit (INDEPENDENT_AMBULATORY_CARE_PROVIDER_SITE_OTHER): Payer: No Typology Code available for payment source | Admitting: Internal Medicine

## 2022-11-29 DIAGNOSIS — R188 Other ascites: Secondary | ICD-10-CM | POA: Diagnosis not present

## 2022-11-29 DIAGNOSIS — K7460 Unspecified cirrhosis of liver: Secondary | ICD-10-CM

## 2022-11-29 DIAGNOSIS — Z23 Encounter for immunization: Secondary | ICD-10-CM

## 2022-11-29 NOTE — Telephone Encounter (Signed)
He came in today for his vaccines and Elon Spanner, CMA gave him the phone # [phone] to call and set up his U/S.

## 2022-12-06 ENCOUNTER — Encounter: Payer: Self-pay | Admitting: Internal Medicine

## 2022-12-11 ENCOUNTER — Ambulatory Visit: Payer: No Typology Code available for payment source | Admitting: Skilled Nursing Facility1

## 2022-12-18 ENCOUNTER — Ambulatory Visit: Payer: No Typology Code available for payment source | Admitting: "Endocrinology

## 2022-12-20 NOTE — Telephone Encounter (Signed)
I am mailing Zachary Lewis a letter to please call and set up his ultrasound that Dr Leone Payor request he do. The order is in epic.

## 2022-12-27 ENCOUNTER — Other Ambulatory Visit: Payer: Self-pay

## 2022-12-27 DIAGNOSIS — E1165 Type 2 diabetes mellitus with hyperglycemia: Secondary | ICD-10-CM

## 2022-12-27 MED ORDER — INSULIN PEN NEEDLE 31G X 8 MM MISC: 1.0000 | Freq: Every day | 0 refills | Status: DC

## 2023-01-22 ENCOUNTER — Telehealth: Payer: Self-pay

## 2023-01-22 ENCOUNTER — Ambulatory Visit (INDEPENDENT_AMBULATORY_CARE_PROVIDER_SITE_OTHER): Payer: No Typology Code available for payment source | Admitting: Podiatry

## 2023-01-22 ENCOUNTER — Encounter: Payer: Self-pay | Admitting: Podiatry

## 2023-01-22 DIAGNOSIS — M79674 Pain in right toe(s): Secondary | ICD-10-CM

## 2023-01-22 DIAGNOSIS — M79675 Pain in left toe(s): Secondary | ICD-10-CM | POA: Diagnosis not present

## 2023-01-22 DIAGNOSIS — D2372 Other benign neoplasm of skin of left lower limb, including hip: Secondary | ICD-10-CM

## 2023-01-22 DIAGNOSIS — B351 Tinea unguium: Secondary | ICD-10-CM | POA: Diagnosis not present

## 2023-01-22 NOTE — Telephone Encounter (Signed)
Patient wants refill of Lotrisone refill for today.

## 2023-01-22 NOTE — Progress Notes (Signed)
Chief Complaint  Patient presents with   Diabetes    Patient is here for a follow up for chronic conditions for [facility]     SUBJECTIVE Patient with a history of diabetes mellitus presents to office today complaining of elongated, thickened nails that cause pain while ambulating in shoes.  Patient is unable to trim their own nails.  Patient also has symptomatic calluses to the bilateral feet.  Patient is here for further evaluation and treatment.  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) 08/04/2018   Diabetes mellitus    Type II   Hyperlipidemia    Hypertension    NSAID-induced gastric ulcer 12/13/2018   EGD 11/2018 H pylori neg Omeprazole started   Obesity    Personal history of colonic adenomas 03/09/2013   Tinea pedis    Chronic    No Known Allergies   OBJECTIVE General Patient is awake, alert, and oriented x 3 and in no acute distress. Derm Skin is dry and supple bilateral. Negative open lesions or macerations. Remaining integument unremarkable. Nails are tender, long, thickened and dystrophic with subungual debris, consistent with onychomycosis, 1-5 bilateral. No signs of infection noted.  Hyperkeratotic benign skin lesions noted to the bilateral feet Vasc  VAS [place] LOWER EXT ART SEG MULTI 02/12/2021 ABI Findings:  +---------+------------------+-----+---------+--------+  Right   Rt Pressure (mmHg)IndexWaveform Comment   +---------+------------------+-----+---------+--------+  Brachial 138                                       +---------+------------------+-----+---------+--------+  CFA                            triphasic          +---------+------------------+-----+---------+--------+  Popliteal                      triphasic          +---------+------------------+-----+---------+--------+  PTA                            absent              +---------+------------------+-----+---------+--------+  PERO                           triphasic          +---------+------------------+-----+---------+--------+  DP      112               0.81 biphasic           +---------+------------------+-----+---------+--------+  Great Toe69                0.50 Abnormal Mildly    +---------+------------------+-----+---------+--------+   +---------+------------------+-----+----------+----------+  Left    Lt Pressure (mmHg)IndexWaveform  Comment     +---------+------------------+-----+----------+----------+  Brachial 137                                          +---------+------------------+-----+----------+----------+  CFA  triphasic             +---------+------------------+-----+----------+----------+  Popliteal                      triphasic             +---------+------------------+-----+----------+----------+  PTA                            absent                +---------+------------------+-----+----------+----------+  PERO                           monophasic            +---------+------------------+-----+----------+----------+  DP      89                0.64 monophasic            +---------+------------------+-----+----------+----------+  Great Toe69                0.50 Abnormal  Moderately  +---------+------------------+-----+----------+----------+   +-------+-----------+-----------+------------+------------+  ABI/TBIToday's ABIToday's TBIPrevious ABIPrevious TBI  +-------+-----------+-----------+------------+------------+  Right .81        .50                                  +-------+-----------+-----------+------------+------------+  Left  .72        .50                                  +-------+-----------+-----------+------------+------------+  Summary:  Right: Resting right ankle-brachial index indicates mild right  lower  extremity arterial disease. The right toe-brachial index is abnormal.   Left: Resting left ankle-brachial index indicates moderate left lower  extremity arterial disease. The left toe-brachial index is abnormal.  Neuro light touch and protective threshold sensation diminished bilaterally.  Musculoskeletal Exam No symptomatic pedal deformities noted bilateral. Muscular strength within normal limits.  ASSESSMENT 1. 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  Pain due to onychomycosis of toenails bilateral 3.  Benign hyperkeratotic skin lesions bilateral  PLAN OF CARE -Patient evaluated today.  Comprehensive diabetic foot exam performed today.  Stressed the importance of working closely with his PCP for better diabetes management -Instructed to maintain good pedal hygiene and foot care. Stressed importance of controlling blood sugar.  -Mechanical debridement of nails 1-5 bilaterally performed using a nail nipper. Filed with dremel without incident.  -Excisional debridement of the hyperkeratotic skin lesions was performed today using a 312 scalpel without incident or bleeding.  Salicylic acid and a light Band-Aid applied. -Return to clinic in 3 months  *Works at [employer] x 20 years  Felecia Shelling, DPM [facility]  Dr. Felecia Shelling, DPM    [address] [place], [place] [address]                Office [phone]  Fax [phone]

## 2023-01-23 ENCOUNTER — Other Ambulatory Visit (HOSPITAL_COMMUNITY): Payer: Self-pay

## 2023-01-23 MED ORDER — TADALAFIL 20 MG PO TABS
20.0000 mg | ORAL_TABLET | Freq: Every day | ORAL | 3 refills | Status: DC | PRN
  Filled 2023-01-23: qty 60, 60d supply, fill #0
  Filled 2023-05-09: qty 60, 60d supply, fill #1

## 2023-01-24 MED ORDER — CLOTRIMAZOLE-BETAMETHASONE 1-0.05 % EX CREA: 1.0000 | TOPICAL_CREAM | Freq: Every day | CUTANEOUS | 2 refills | Status: DC

## 2023-01-28 ENCOUNTER — Other Ambulatory Visit (HOSPITAL_COMMUNITY): Payer: Self-pay

## 2023-02-13 ENCOUNTER — Telehealth: Payer: Self-pay

## 2023-02-13 ENCOUNTER — Ambulatory Visit: Payer: No Typology Code available for payment source

## 2023-02-13 NOTE — Telephone Encounter (Signed)
Zachary Lewis no showed his Hep A and Hep B appointment today. I called him and he said he was super BUSY and would call us back.

## 2023-02-24 NOTE — Progress Notes (Unsigned)
Cardiology Office Note   Date:  02/25/2023   ID:  Zachary Lewis, DOB [birth year], MRN [id number]  PCP:  Judy Pimple, MD  Cardiologist:  Dr. Elease Hashimoto  No chief complaint on file.      History of Present Illness: Zachary Lewis is a 62 y.o. male who is here today for follow-up visit regarding peripheral arterial disease.   He has known history of essential hypertension, type 2 diabetes, hyperlipidemia, liver cirrhosis due to NASH and tobacco use. He is followed for bilateral leg claudication with below the knee disease. He underwent noninvasive vascular evaluation in September 2022 which showed an ABI of 0.81 on the right and 0.72 on the left.  Duplex showed mainly below the knee disease bilaterally.  On the right side, there was one-vessel runoff via the peroneal artery.  On the left side, there was evidence of popliteal/TP trunk aneurysm measuring 1.8 cm with occluded tibial peroneal vessels. 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 He was placed on dual antiplatelet therapy.    He has been doing reasonably well with no calf claudication.  He has chronic numbness in both feet likely due to diabetic neuropathy. He quit smoking but relapses every now and then.  He also does not take Repatha all the time.  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) 08/04/2018   Diabetes mellitus    Type II   Hyperlipidemia    Hypertension    NSAID-induced gastric ulcer 12/13/2018   EGD 11/2018 H pylori neg Omeprazole started   Obesity    Personal history of colonic adenomas 03/09/2013   Tinea pedis    Chronic    Past Surgical History:  Procedure Laterality Date   COLONOSCOPY     COLONOSCOPY W/ POLYPECTOMY  2014   IR PARACENTESIS  07/02/2018   IR  TRANSCATHETER BX  07/17/2018   IR VENOGRAM HEPATIC WO HEMODYNAMIC EVALUATION  07/17/2018   myringectomy  06/2005   POLYPECTOMY     UMBILICAL HERNIA REPAIR  01/1997   VASECTOMY  07/2002     Current Outpatient Medications  Medication Sig Dispense Refill   aspirin EC 81 MG tablet Take 1 tablet (81 mg total) by mouth daily. Swallow whole. 90 tablet 3   clotrimazole-betamethasone (LOTRISONE) cream Apply 1 Application topically daily. 45 g 2   Continuous Glucose Sensor (DEXCOM G7 SENSOR) MISC 1 Device by Does not apply route continuous. 9 each 0   Evolocumab (REPATHA SURECLICK) 140 MG/ML SOAJ Inject 140 mg into the skin every 14 (fourteen) days. 6 mL 3   insulin glargine (LANTUS SOLOSTAR) 100 UNIT/ML Solostar Pen Inject 10 Units into the skin daily. Plus sliding scale, max dose 50 units a day 45 mL 1   Insulin Pen Needle 31G X 8 MM MISC Inject 1 Needle into the skin daily at 6 (six) AM. 100 each 0   latanoprost (XALATAN) 0.005 % ophthalmic solution SMARTSIG:In Eye(s)     Semaglutide,0.25 or 0.5MG /DOS, 2 MG/3ML SOPN Inject 0.25 mg into the skin once a week. 3 mL 0   spironolactone (ALDACTONE) 50 MG tablet Take 1 tablet (50 mg total) by mouth daily. 90 tablet 3   tadalafil (CIALIS) 20 MG tablet Take 1 tablet (20 mg total) by mouth as needed. 10 tablet  11   thiamine 100 MG tablet Take 1 tablet (100 mg total) by mouth daily. 30 tablet 0   clopidogrel (PLAVIX) 75 MG tablet Take 1 tablet (75 mg total) by mouth daily. 90 tablet 3   glucose blood (ONETOUCH VERIO) test strip 1 each by Other route daily. And lancets 1/day (Patient not taking: Reported on 02/25/2023) 100 each 3   tadalafil (CIALIS) 20 MG tablet Take 1 tablet (20 mg total) by mouth daily as needed. (Patient not taking: Reported on 02/25/2023) 90 tablet 2   tadalafil (CIALIS) 20 MG tablet Take 1 tablet (20 mg total) by mouth daily as needed. (Patient not taking: Reported on 02/25/2023) 60 tablet 3   No current facility-administered medications  for this visit.    Allergies:   Patient has no known allergies.    Social History:  The patient  reports that he has been smoking cigarettes. He has never used smokeless tobacco. He reports current alcohol use. He reports that he does not use drugs.   Family History:  The patient's family history includes Hypertension in an other family member.    ROS:  Please see the history of present illness.   Otherwise, review of systems are positive for none.   All other systems are reviewed and negative.    PHYSICAL EXAM: VS:  BP (!) 140/86 (BP Location: Left Arm, Patient Position: Sitting, Cuff Size: Normal)   Pulse 82   Ht 5\' 6"  (1.676 m)   Wt 202 lb 12.8 oz (92 kg)   SpO2 90%   BMI 32.73 kg/m  , BMI Body mass index is 32.73 kg/m. GEN: Well nourished, well developed, in no acute distress  HEENT: normal  Neck: no JVD, carotid bruits, or masses Cardiac: RRR; no rubs, or gallops,no edema . 1/6 SEM in aortic area.  Respiratory:  clear to auscultation bilaterally, normal work of breathing GI: soft, nontender, nondistended, + BS MS: no deformity or atrophy  Skin: warm and dry, no rash Neuro:  Strength and sensation are intact Psych: euthymic mood, full affect Vascular: Femoral pulses normal bilaterally.  Distal pulses are not palpable.     EKG:  EKG not ordered today.    Recent Labs: 09/27/2022: ALT 49; BUN 16; Creatinine, Ser 0.76; Hemoglobin 15.6; Platelets 237.0; Potassium 4.1; Sodium 135    Lipid Panel    Component Value Date/Time   CHOL 222 (H) 09/27/2022 0823   TRIG 90.0 09/27/2022 0823   HDL 54.60 09/27/2022 0823   CHOLHDL 4 09/27/2022 0823   VLDL 18.0 09/27/2022 0823   LDLCALC 150 (H) 09/27/2022 0823   LDLDIRECT 176.9 12/08/2012 0807      Wt Readings from Last 3 Encounters:  02/25/23 202 lb 12.8 oz (92 kg)  11/25/22 197 lb (89.4 kg)  10/10/22 199 lb 6.4 oz (90.4 kg)           No data to display            ASSESSMENT AND PLAN:  1.  Peripheral  arterial disease: He reports resolution of claudication and he seems to be asymptomatic overall.    He does have a left popliteal artery aneurysm but the TP trunk and anterior tibial arteries are both occluded proximally likely from embolization.  Thus, risk of further embolization is low and thus there is likely very little benefit of treating the left popliteal artery aneurysm.  Continue dual antiplatelet therapy.  I refilled clopidogrel today.  2.  Tobacco use: He quit smoking but continues to  smoke intermittently.  I discussed with him the importance of smoking cessation.  3.  Hyperlipidemia: Intolerance to statins.  Previous LDL was 181.  His LDL decreased to 63 with Repatha.  However, his most recent LDL was elevated and he reports missing few doses of Repatha at that time.  I discussed with him the importance of compliance.  4.  Essential hypertension: His blood pressure is mildly elevated.  Continue spironolactone for now.  Consider adding an ACE inhibitor or ARB given that he is diabetic.    Disposition:   FU with me in 12 months  Signed,  Lorine Bears, MD  02/25/2023 8:22 AM    [facility]

## 2023-02-25 ENCOUNTER — Ambulatory Visit
Payer: No Typology Code available for payment source | Attending: Cardiovascular Disease | Admitting: Cardiovascular Disease

## 2023-02-25 ENCOUNTER — Encounter: Payer: Self-pay | Admitting: Cardiovascular Disease

## 2023-02-25 VITALS — BP 140/86 | HR 82 | Ht 66.0 in | Wt 202.8 lb

## 2023-02-25 DIAGNOSIS — I739 Peripheral vascular disease, unspecified: Secondary | ICD-10-CM

## 2023-02-25 DIAGNOSIS — E785 Hyperlipidemia, unspecified: Secondary | ICD-10-CM

## 2023-02-25 DIAGNOSIS — I10 Essential (primary) hypertension: Secondary | ICD-10-CM

## 2023-02-25 DIAGNOSIS — Z720 Tobacco use: Secondary | ICD-10-CM | POA: Diagnosis not present

## 2023-02-25 MED ORDER — CLOPIDOGREL BISULFATE 75 MG PO TABS: 75.0000 mg | ORAL_TABLET | Freq: Every day | ORAL | 3 refills | Status: DC

## 2023-02-25 NOTE — Patient Instructions (Signed)
Medication Instructions:  No changes *If you need a refill on your cardiac medications before your next appointment, please call your pharmacy*   Lab Work: None ordered If you have labs (blood work) drawn today and your tests are completely normal, you will receive your results only by: MyChart Message (if you have MyChart) OR A paper copy in the mail If you have any lab test that is abnormal or we need to change your treatment, we will call you to review the results.   Testing/Procedures: None ordered   Follow-Up: At  [facility], you and your health needs are our priority.  As part of our continuing mission to provide you with exceptional heart care, we have created designated Provider Care Teams.  These Care Teams include your primary Cardiologist (physician) and Advanced Practice Providers (APPs -  Physician Assistants and Nurse Practitioners) who all work together to provide you with the care you need, when you need it.  We recommend signing up for the patient portal called "MyChart".  Sign up information is provided on this After Visit Summary.  MyChart is used to connect with patients for Virtual Visits (Telemedicine).  Patients are able to view lab/test results, encounter notes, upcoming appointments, etc.  Non-urgent messages can be sent to your provider as well.   To learn more about what you can do with MyChart, go to [email or web address].    Your next appointment:   12 month(s)  Provider:   Dr. Arida 

## 2023-02-27 ENCOUNTER — Ambulatory Visit: Payer: No Typology Code available for payment source

## 2023-04-11 ENCOUNTER — Inpatient Hospital Stay (HOSPITAL_COMMUNITY)
Admission: EM | Admit: 2023-04-11 | Discharge: 2023-04-15 | DRG: 300 | Disposition: A | Discharge status: Home / Self Care | Payer: No Typology Code available for payment source | Attending: Internal Medicine | Admitting: Internal Medicine

## 2023-04-11 ENCOUNTER — Emergency Department (HOSPITAL_COMMUNITY): Payer: No Typology Code available for payment source

## 2023-04-11 ENCOUNTER — Encounter (HOSPITAL_COMMUNITY): Payer: Self-pay

## 2023-04-11 DIAGNOSIS — B351 Tinea unguium: Secondary | ICD-10-CM | POA: Diagnosis present

## 2023-04-11 DIAGNOSIS — Z7902 Long term (current) use of antithrombotics/antiplatelets: Secondary | ICD-10-CM

## 2023-04-11 DIAGNOSIS — L97529 Non-pressure chronic ulcer of other part of left foot with unspecified severity: Secondary | ICD-10-CM | POA: Diagnosis present

## 2023-04-11 DIAGNOSIS — E785 Hyperlipidemia, unspecified: Secondary | ICD-10-CM | POA: Diagnosis present

## 2023-04-11 DIAGNOSIS — E669 Obesity, unspecified: Secondary | ICD-10-CM | POA: Diagnosis present

## 2023-04-11 DIAGNOSIS — E1152 Type 2 diabetes mellitus with diabetic peripheral angiopathy with gangrene: Principal | ICD-10-CM | POA: Diagnosis present

## 2023-04-11 DIAGNOSIS — K5520 Angiodysplasia of colon without hemorrhage: Secondary | ICD-10-CM | POA: Diagnosis present

## 2023-04-11 DIAGNOSIS — E876 Hypokalemia: Secondary | ICD-10-CM | POA: Diagnosis present

## 2023-04-11 DIAGNOSIS — E11628 Type 2 diabetes mellitus with other skin complications: Secondary | ICD-10-CM | POA: Diagnosis not present

## 2023-04-11 DIAGNOSIS — Z6832 Body mass index (BMI) 32.0-32.9, adult: Secondary | ICD-10-CM

## 2023-04-11 DIAGNOSIS — I70262 Atherosclerosis of native arteries of extremities with gangrene, left leg: Secondary | ICD-10-CM | POA: Diagnosis present

## 2023-04-11 DIAGNOSIS — I739 Peripheral vascular disease, unspecified: Principal | ICD-10-CM | POA: Diagnosis present

## 2023-04-11 DIAGNOSIS — E1165 Type 2 diabetes mellitus with hyperglycemia: Secondary | ICD-10-CM

## 2023-04-11 DIAGNOSIS — E11621 Type 2 diabetes mellitus with foot ulcer: Secondary | ICD-10-CM | POA: Diagnosis present

## 2023-04-11 DIAGNOSIS — I70245 Atherosclerosis of native arteries of left leg with ulceration of other part of foot: Secondary | ICD-10-CM | POA: Diagnosis present

## 2023-04-11 DIAGNOSIS — Z794 Long term (current) use of insulin: Secondary | ICD-10-CM

## 2023-04-11 DIAGNOSIS — Z7985 Long-term (current) use of injectable non-insulin antidiabetic drugs: Secondary | ICD-10-CM

## 2023-04-11 DIAGNOSIS — M868X7 Other osteomyelitis, ankle and foot: Secondary | ICD-10-CM | POA: Diagnosis present

## 2023-04-11 DIAGNOSIS — I70222 Atherosclerosis of native arteries of extremities with rest pain, left leg: Secondary | ICD-10-CM | POA: Diagnosis present

## 2023-04-11 DIAGNOSIS — Z7982 Long term (current) use of aspirin: Secondary | ICD-10-CM

## 2023-04-11 DIAGNOSIS — E1169 Type 2 diabetes mellitus with other specified complication: Secondary | ICD-10-CM | POA: Diagnosis present

## 2023-04-11 DIAGNOSIS — F17210 Nicotine dependence, cigarettes, uncomplicated: Secondary | ICD-10-CM | POA: Diagnosis present

## 2023-04-11 DIAGNOSIS — E66811 Obesity, class 1: Secondary | ICD-10-CM | POA: Diagnosis present

## 2023-04-11 DIAGNOSIS — Z720 Tobacco use: Secondary | ICD-10-CM | POA: Diagnosis present

## 2023-04-11 DIAGNOSIS — I10 Essential (primary) hypertension: Secondary | ICD-10-CM | POA: Diagnosis present

## 2023-04-11 DIAGNOSIS — Z6833 Body mass index (BMI) 33.0-33.9, adult: Secondary | ICD-10-CM

## 2023-04-11 DIAGNOSIS — Z79899 Other long term (current) drug therapy: Secondary | ICD-10-CM

## 2023-04-11 DIAGNOSIS — T39395A Adverse effect of other nonsteroidal anti-inflammatory drugs [NSAID], initial encounter: Secondary | ICD-10-CM | POA: Diagnosis present

## 2023-04-11 DIAGNOSIS — K7460 Unspecified cirrhosis of liver: Secondary | ICD-10-CM | POA: Diagnosis present

## 2023-04-11 DIAGNOSIS — M79675 Pain in left toe(s): Principal | ICD-10-CM

## 2023-04-11 DIAGNOSIS — L089 Local infection of the skin and subcutaneous tissue, unspecified: Secondary | ICD-10-CM | POA: Diagnosis present

## 2023-04-11 DIAGNOSIS — R188 Other ascites: Secondary | ICD-10-CM | POA: Diagnosis present

## 2023-04-11 DIAGNOSIS — Z8249 Family history of ischemic heart disease and other diseases of the circulatory system: Secondary | ICD-10-CM

## 2023-04-11 DIAGNOSIS — K259 Gastric ulcer, unspecified as acute or chronic, without hemorrhage or perforation: Secondary | ICD-10-CM | POA: Diagnosis present

## 2023-04-11 DIAGNOSIS — Z87891 Personal history of nicotine dependence: Secondary | ICD-10-CM | POA: Diagnosis present

## 2023-04-11 LAB — CBC
HCT: 48.2 % (ref 39.0–52.0)
Hemoglobin: 16.4 g/dL (ref 13.0–17.0)
MCH: 34 pg (ref 26.0–34.0)
MCHC: 34 g/dL (ref 30.0–36.0)
MCV: 99.8 fL (ref 80.0–100.0)
Platelets: 258 10*3/uL (ref 150–400)
RBC: 4.83 MIL/uL (ref 4.22–5.81)
RDW: 13 % (ref 11.5–15.5)
WBC: 6.8 10*3/uL (ref 4.0–10.5)
nRBC: 0 % (ref 0.0–0.2)

## 2023-04-11 LAB — BASIC METABOLIC PANEL WITH GFR
Anion gap: 19 — ABNORMAL HIGH (ref 5–15)
BUN: 12 mg/dL (ref 8–23)
CO2: 18 mmol/L — ABNORMAL LOW (ref 22–32)
Calcium: 9.5 mg/dL (ref 8.9–10.3)
Chloride: 100 mmol/L (ref 98–111)
Creatinine, Ser: 0.78 mg/dL (ref 0.61–1.24)
GFR, Estimated: >60 mL/min
Glucose, Bld: 298 mg/dL — ABNORMAL HIGH (ref 70–99)
Potassium: 3.4 mmol/L — ABNORMAL LOW (ref 3.5–5.1)
Sodium: 137 mmol/L (ref 135–145)

## 2023-04-11 LAB — CBG MONITORING, ED: Glucose-Capillary: 276 mg/dL — ABNORMAL HIGH (ref 70–99)

## 2023-04-11 NOTE — ED Provider Triage Note (Signed)
Emergency Medicine Provider Triage Evaluation Note  WISAM FAHNESTOCK , a 62 y.o. male  was evaluated in triage.  Pt complains of left foot pain.  Started 2 weeks ago denies trauma.  Patient does have diabetes.  States his toenail fell off last night.  Reports of bleeding there as well.  Toe appears to be swollen as well.  Review of Systems  Positive: See above Negative: See above  Physical Exam  BP (!) 142/93 (BP Location: Right Arm)   Pulse 99   Temp 97.7 F (36.5 C) (Oral)   Resp 17   SpO2 96%  Gen:   Awake, no distress   Resp:  Normal effort  MSK:   Moves extremities without difficulty  Other:    Medical Decision Making  Medically screening exam initiated at 8:05 PM.  Appropriate orders placed.  KENDARIOUS DUBUQUE was informed that the remainder of the evaluation will be completed by another provider, this initial triage assessment does not replace that evaluation, and the importance of remaining in the ED until their evaluation is complete.  Work up started   Gareth Eagle, Cordelia Poche 04/11/23 2007

## 2023-04-11 NOTE — ED Triage Notes (Signed)
Pt is coming in for left foot pain, more specifically in the left toe but does endorse he feels it in the entire left foot. The pain in his foot has been going on for 2 weeks but in the left few days he hyas had swelling in his left big toe and in the last day the toenail has fallen off the left big toe. There is potentially some mild discoloration in the left foot but is able to more all digits. He has no other complaints at this time, he is diabetic.

## 2023-04-12 ENCOUNTER — Inpatient Hospital Stay (HOSPITAL_COMMUNITY): Payer: No Typology Code available for payment source

## 2023-04-12 DIAGNOSIS — K7460 Unspecified cirrhosis of liver: Secondary | ICD-10-CM

## 2023-04-12 DIAGNOSIS — L97529 Non-pressure chronic ulcer of other part of left foot with unspecified severity: Secondary | ICD-10-CM | POA: Diagnosis present

## 2023-04-12 DIAGNOSIS — Z7902 Long term (current) use of antithrombotics/antiplatelets: Secondary | ICD-10-CM | POA: Diagnosis not present

## 2023-04-12 DIAGNOSIS — E11628 Type 2 diabetes mellitus with other skin complications: Secondary | ICD-10-CM | POA: Diagnosis present

## 2023-04-12 DIAGNOSIS — I739 Peripheral vascular disease, unspecified: Principal | ICD-10-CM | POA: Diagnosis present

## 2023-04-12 DIAGNOSIS — B351 Tinea unguium: Secondary | ICD-10-CM | POA: Diagnosis present

## 2023-04-12 DIAGNOSIS — E669 Obesity, unspecified: Secondary | ICD-10-CM | POA: Diagnosis present

## 2023-04-12 DIAGNOSIS — E1152 Type 2 diabetes mellitus with diabetic peripheral angiopathy with gangrene: Secondary | ICD-10-CM | POA: Diagnosis present

## 2023-04-12 DIAGNOSIS — Z7982 Long term (current) use of aspirin: Secondary | ICD-10-CM | POA: Diagnosis not present

## 2023-04-12 DIAGNOSIS — E1165 Type 2 diabetes mellitus with hyperglycemia: Secondary | ICD-10-CM | POA: Diagnosis present

## 2023-04-12 DIAGNOSIS — E876 Hypokalemia: Secondary | ICD-10-CM

## 2023-04-12 DIAGNOSIS — E66811 Obesity, class 1: Secondary | ICD-10-CM | POA: Diagnosis present

## 2023-04-12 DIAGNOSIS — I70245 Atherosclerosis of native arteries of left leg with ulceration of other part of foot: Secondary | ICD-10-CM | POA: Diagnosis present

## 2023-04-12 DIAGNOSIS — Z79899 Other long term (current) drug therapy: Secondary | ICD-10-CM | POA: Diagnosis not present

## 2023-04-12 DIAGNOSIS — L089 Local infection of the skin and subcutaneous tissue, unspecified: Secondary | ICD-10-CM | POA: Diagnosis present

## 2023-04-12 DIAGNOSIS — M869 Osteomyelitis, unspecified: Secondary | ICD-10-CM | POA: Diagnosis not present

## 2023-04-12 DIAGNOSIS — Z794 Long term (current) use of insulin: Secondary | ICD-10-CM | POA: Diagnosis not present

## 2023-04-12 DIAGNOSIS — I70262 Atherosclerosis of native arteries of extremities with gangrene, left leg: Secondary | ICD-10-CM | POA: Diagnosis present

## 2023-04-12 DIAGNOSIS — E785 Hyperlipidemia, unspecified: Secondary | ICD-10-CM

## 2023-04-12 DIAGNOSIS — K259 Gastric ulcer, unspecified as acute or chronic, without hemorrhage or perforation: Secondary | ICD-10-CM | POA: Diagnosis present

## 2023-04-12 DIAGNOSIS — F17210 Nicotine dependence, cigarettes, uncomplicated: Secondary | ICD-10-CM | POA: Diagnosis present

## 2023-04-12 DIAGNOSIS — Z6833 Body mass index (BMI) 33.0-33.9, adult: Secondary | ICD-10-CM | POA: Diagnosis not present

## 2023-04-12 DIAGNOSIS — Z6832 Body mass index (BMI) 32.0-32.9, adult: Secondary | ICD-10-CM | POA: Diagnosis not present

## 2023-04-12 DIAGNOSIS — I7090 Unspecified atherosclerosis: Secondary | ICD-10-CM | POA: Diagnosis not present

## 2023-04-12 DIAGNOSIS — I10 Essential (primary) hypertension: Secondary | ICD-10-CM

## 2023-04-12 DIAGNOSIS — M79673 Pain in unspecified foot: Secondary | ICD-10-CM | POA: Diagnosis not present

## 2023-04-12 DIAGNOSIS — E1169 Type 2 diabetes mellitus with other specified complication: Secondary | ICD-10-CM | POA: Diagnosis present

## 2023-04-12 DIAGNOSIS — E1151 Type 2 diabetes mellitus with diabetic peripheral angiopathy without gangrene: Secondary | ICD-10-CM | POA: Diagnosis not present

## 2023-04-12 DIAGNOSIS — E11621 Type 2 diabetes mellitus with foot ulcer: Secondary | ICD-10-CM | POA: Diagnosis present

## 2023-04-12 DIAGNOSIS — R188 Other ascites: Secondary | ICD-10-CM

## 2023-04-12 DIAGNOSIS — I70222 Atherosclerosis of native arteries of extremities with rest pain, left leg: Secondary | ICD-10-CM | POA: Diagnosis present

## 2023-04-12 DIAGNOSIS — M868X7 Other osteomyelitis, ankle and foot: Secondary | ICD-10-CM | POA: Diagnosis present

## 2023-04-12 LAB — CBG MONITORING, ED: Glucose-Capillary: 291 mg/dL — ABNORMAL HIGH (ref 70–99)

## 2023-04-12 LAB — CBC WITH DIFFERENTIAL/PLATELET
Abs Immature Granulocytes: 0.02 10*3/uL (ref 0.00–0.07)
Basophils Absolute: 0.1 10*3/uL (ref 0.0–0.1)
Basophils Relative: 2 %
Eosinophils Absolute: 0.4 10*3/uL (ref 0.0–0.5)
Eosinophils Relative: 6 %
HCT: 44.3 % (ref 39.0–52.0)
Hemoglobin: 15.5 g/dL (ref 13.0–17.0)
Immature Granulocytes: 0 %
Lymphocytes Relative: 22 %
Lymphs Abs: 1.6 10*3/uL (ref 0.7–4.0)
MCH: 33.8 pg (ref 26.0–34.0)
MCHC: 35 g/dL (ref 30.0–36.0)
MCV: 96.5 fL (ref 80.0–100.0)
Monocytes Absolute: 0.7 10*3/uL (ref 0.1–1.0)
Monocytes Relative: 9 %
Neutro Abs: 4.6 10*3/uL (ref 1.7–7.7)
Neutrophils Relative %: 61 %
Platelets: 247 10*3/uL (ref 150–400)
RBC: 4.59 MIL/uL (ref 4.22–5.81)
RDW: 13 % (ref 11.5–15.5)
WBC: 7.4 10*3/uL (ref 4.0–10.5)
nRBC: 0 % (ref 0.0–0.2)

## 2023-04-12 LAB — C-REACTIVE PROTEIN: CRP: 1 mg/dL — ABNORMAL HIGH (ref <1.0)

## 2023-04-12 LAB — I-STAT VENOUS BLOOD GAS, ED
Acid-Base Excess: 2 mmol/L (ref 0.0–2.0)
Bicarbonate: 24.9 mmol/L (ref 20.0–28.0)
Calcium, Ion: 1.05 mmol/L — ABNORMAL LOW (ref 1.15–1.40)
HCT: 47 % (ref 39.0–52.0)
Hemoglobin: 16 g/dL (ref 13.0–17.0)
O2 Saturation: 89 %
Potassium: 4 mmol/L (ref 3.5–5.1)
Sodium: 135 mmol/L (ref 135–145)
TCO2: 26 mmol/L (ref 22–32)
pCO2, Ven: 34.5 mm[Hg] — ABNORMAL LOW (ref 44–60)
pH, Ven: 7.467 — ABNORMAL HIGH (ref 7.25–7.43)
pO2, Ven: 53 mm[Hg] — ABNORMAL HIGH (ref 32–45)

## 2023-04-12 LAB — HEPATIC FUNCTION PANEL
ALT: 32 U/L (ref 0–44)
AST: 30 U/L (ref 15–41)
Albumin: 3.9 g/dL (ref 3.5–5.0)
Alkaline Phosphatase: 118 U/L (ref 38–126)
Bilirubin, Direct: 0.1 mg/dL (ref 0.0–0.2)
Indirect Bilirubin: 0.5 mg/dL (ref 0.3–0.9)
Total Bilirubin: 0.6 mg/dL (ref <1.2)
Total Protein: 7.8 g/dL (ref 6.5–8.1)

## 2023-04-12 LAB — SEDIMENTATION RATE: Sed Rate: 12 mm/h (ref 0–16)

## 2023-04-12 LAB — GLUCOSE, CAPILLARY
Glucose-Capillary: 270 mg/dL — ABNORMAL HIGH (ref 70–99)
Glucose-Capillary: 233 mg/dL — ABNORMAL HIGH (ref 70–99)
Glucose-Capillary: 232 mg/dL — ABNORMAL HIGH (ref 70–99)

## 2023-04-12 LAB — BASIC METABOLIC PANEL
Anion gap: 14 (ref 5–15)
BUN: 11 mg/dL (ref 8–23)
CO2: 23 mmol/L (ref 22–32)
Calcium: 9 mg/dL (ref 8.9–10.3)
Chloride: 95 mmol/L — ABNORMAL LOW (ref 98–111)
Creatinine, Ser: 0.86 mg/dL (ref 0.61–1.24)
GFR, Estimated: >60 mL/min (ref >60)
Glucose, Bld: 312 mg/dL — ABNORMAL HIGH (ref 70–99)
Potassium: 4 mmol/L (ref 3.5–5.1)
Sodium: 132 mmol/L — ABNORMAL LOW (ref 135–145)

## 2023-04-12 LAB — HEMOGLOBIN A1C
Hgb A1c MFr Bld: 10.8 % — ABNORMAL HIGH (ref 4.8–5.6)
Mean Plasma Glucose: 263.26 mg/dL

## 2023-04-12 LAB — BETA-HYDROXYBUTYRIC ACID: Beta-Hydroxybutyric Acid: 0.3 mmol/L — ABNORMAL HIGH (ref 0.05–0.27)

## 2023-04-12 LAB — HIV ANTIBODY (ROUTINE TESTING W REFLEX): HIV Screen 4th Generation wRfx: NONREACTIVE

## 2023-04-12 LAB — ETHANOL: Alcohol, Ethyl (B): 11 mg/dL — ABNORMAL HIGH (ref <10)

## 2023-04-12 MED ORDER — SODIUM CHLORIDE 0.9 % IV SOLN: INTRAVENOUS | Status: AC

## 2023-04-12 MED ORDER — INSULIN ASPART 100 UNIT/ML IJ SOLN
0.0000 [IU] | Freq: Every day | INTRAMUSCULAR | Status: DC
  Administered 2023-04-12 – 2023-04-13 (×2): 2 [IU] via SUBCUTANEOUS

## 2023-04-12 MED ORDER — CLOPIDOGREL BISULFATE 75 MG PO TABS
75.0000 mg | ORAL_TABLET | Freq: Every day | ORAL | Status: DC
  Administered 2023-04-12 – 2023-04-15 (×4): 75 mg via ORAL
  Filled 2023-04-12 (×4): qty 1

## 2023-04-12 MED ORDER — ACETAMINOPHEN 650 MG RE SUPP: 650.0000 mg | Freq: Four times a day (QID) | RECTAL | Status: DC | PRN

## 2023-04-12 MED ORDER — ASPIRIN 81 MG PO TBEC
81.0000 mg | DELAYED_RELEASE_TABLET | Freq: Every day | ORAL | Status: DC
  Administered 2023-04-12 – 2023-04-15 (×4): 81 mg via ORAL
  Filled 2023-04-12 (×4): qty 1

## 2023-04-12 MED ORDER — VANCOMYCIN HCL IN DEXTROSE 1-5 GM/200ML-% IV SOLN
1000.0000 mg | Freq: Once | INTRAVENOUS | Status: AC
  Administered 2023-04-12: 1000 mg via INTRAVENOUS
  Filled 2023-04-12: qty 200

## 2023-04-12 MED ORDER — PIPERACILLIN-TAZOBACTAM 3.375 G IVPB 30 MIN
3.3750 g | Freq: Once | INTRAVENOUS | Status: AC
  Administered 2023-04-12: 3.375 g via INTRAVENOUS
  Filled 2023-04-12: qty 50

## 2023-04-12 MED ORDER — ONDANSETRON HCL 4 MG/2ML IJ SOLN: 4.0000 mg | Freq: Four times a day (QID) | INTRAMUSCULAR | Status: DC | PRN

## 2023-04-12 MED ORDER — INSULIN GLARGINE-YFGN 100 UNIT/ML ~~LOC~~ SOLN
10.0000 [IU] | Freq: Every day | SUBCUTANEOUS | Status: DC
  Administered 2023-04-12: 10 [IU] via SUBCUTANEOUS
  Filled 2023-04-12 (×2): qty 0.1

## 2023-04-12 MED ORDER — ONDANSETRON HCL 4 MG PO TABS: 4.0000 mg | ORAL_TABLET | Freq: Four times a day (QID) | ORAL | Status: DC | PRN

## 2023-04-12 MED ORDER — PIPERACILLIN-TAZOBACTAM 3.375 G IVPB
3.3750 g | Freq: Three times a day (TID) | INTRAVENOUS | Status: DC
  Administered 2023-04-12 – 2023-04-13 (×3): 3.375 g via INTRAVENOUS
  Filled 2023-04-12 (×3): qty 50

## 2023-04-12 MED ORDER — SODIUM CHLORIDE 0.9% FLUSH
3.0000 mL | Freq: Two times a day (BID) | INTRAVENOUS | Status: DC
  Administered 2023-04-12 – 2023-04-14 (×5): 3 mL via INTRAVENOUS

## 2023-04-12 MED ORDER — INSULIN ASPART 100 UNIT/ML IJ SOLN
0.0000 [IU] | Freq: Three times a day (TID) | INTRAMUSCULAR | Status: DC
  Administered 2023-04-12: 5 [IU] via SUBCUTANEOUS
  Administered 2023-04-12 – 2023-04-13 (×2): 8 [IU] via SUBCUTANEOUS
  Administered 2023-04-13: 11 [IU] via SUBCUTANEOUS
  Administered 2023-04-13: 8 [IU] via SUBCUTANEOUS
  Administered 2023-04-14: 2 [IU] via SUBCUTANEOUS
  Administered 2023-04-14 (×2): 5 [IU] via SUBCUTANEOUS
  Administered 2023-04-15: 11 [IU] via SUBCUTANEOUS
  Administered 2023-04-15: 5 [IU] via SUBCUTANEOUS

## 2023-04-12 MED ORDER — INSULIN GLARGINE-YFGN 100 UNIT/ML ~~LOC~~ SOLN
5.0000 [IU] | Freq: Once | SUBCUTANEOUS | Status: AC
  Administered 2023-04-12: 5 [IU] via SUBCUTANEOUS
  Filled 2023-04-12: qty 0.05

## 2023-04-12 MED ORDER — PIPERACILLIN-TAZOBACTAM 3.375 G IVPB 30 MIN: 3.3750 g | Freq: Three times a day (TID) | INTRAVENOUS | Status: DC

## 2023-04-12 MED ORDER — HYDRALAZINE HCL 20 MG/ML IJ SOLN: 10.0000 mg | INTRAMUSCULAR | Status: DC | PRN

## 2023-04-12 MED ORDER — ENOXAPARIN SODIUM 40 MG/0.4ML IJ SOSY
40.0000 mg | PREFILLED_SYRINGE | INTRAMUSCULAR | Status: DC
  Administered 2023-04-13 – 2023-04-14 (×2): 40 mg via SUBCUTANEOUS
  Filled 2023-04-12 (×2): qty 0.4

## 2023-04-12 MED ORDER — ACETAMINOPHEN 325 MG PO TABS
650.0000 mg | ORAL_TABLET | Freq: Four times a day (QID) | ORAL | Status: DC | PRN
  Administered 2023-04-12 – 2023-04-13 (×2): 650 mg via ORAL
  Filled 2023-04-12 (×2): qty 2

## 2023-04-12 MED ORDER — GABAPENTIN 100 MG PO CAPS
100.0000 mg | ORAL_CAPSULE | Freq: Three times a day (TID) | ORAL | Status: DC
  Administered 2023-04-12 – 2023-04-15 (×11): 100 mg via ORAL
  Filled 2023-04-12 (×11): qty 1

## 2023-04-12 MED ORDER — POTASSIUM CHLORIDE CRYS ER 20 MEQ PO TBCR
40.0000 meq | EXTENDED_RELEASE_TABLET | ORAL | Status: AC
  Administered 2023-04-12: 40 meq via ORAL
  Filled 2023-04-12: qty 2

## 2023-04-12 MED ORDER — INSULIN GLARGINE-YFGN 100 UNIT/ML ~~LOC~~ SOLN
15.0000 [IU] | Freq: Every day | SUBCUTANEOUS | Status: DC
  Administered 2023-04-13 – 2023-04-14 (×2): 15 [IU] via SUBCUTANEOUS
  Filled 2023-04-12 (×3): qty 0.15

## 2023-04-12 MED ORDER — THIAMINE MONONITRATE 100 MG PO TABS
100.0000 mg | ORAL_TABLET | Freq: Every day | ORAL | Status: DC
Start: 2023-04-12 — End: 2023-04-15
  Administered 2023-04-12 – 2023-04-15 (×4): 100 mg via ORAL
  Filled 2023-04-12 (×6): qty 1

## 2023-04-12 MED ORDER — VANCOMYCIN HCL IN DEXTROSE 1-5 GM/200ML-% IV SOLN
1000.0000 mg | Freq: Two times a day (BID) | INTRAVENOUS | Status: DC
  Administered 2023-04-12 – 2023-04-13 (×2): 1000 mg via INTRAVENOUS
  Filled 2023-04-12 (×3): qty 200

## 2023-04-12 MED ORDER — NICOTINE 14 MG/24HR TD PT24
14.0000 mg | MEDICATED_PATCH | Freq: Every day | TRANSDERMAL | Status: DC
  Administered 2023-04-12 – 2023-04-15 (×4): 14 mg via TRANSDERMAL
  Filled 2023-04-12 (×4): qty 1

## 2023-04-12 NOTE — ED Provider Notes (Signed)
[facility] DEPT [facility] Emergency Department Provider Note MRN:  [id number]  Arrival date & time: 04/12/23     Chief Complaint   Foot Pain   History of Present Illness   Zachary Lewis is a 62 y.o. year-old male presents to the ED with chief complaint of left foot pain.  He denies any injuries.  States that he sees podiatry.  Hx of diabetes.  Family reports that he has been drinking a lot and was found confused in his car.  He states that the pain radiates up his foot.  He denies fever or chills.  History provided by patient.   Review of Systems  Pertinent positive and negative review of systems noted in HPI.    Physical Exam   Vitals:   04/12/23 0358 04/12/23 0358  BP:  (!) 155/96  Pulse:  97  Resp:  18  Temp: 98.5 F (36.9 C)   SpO2:  96%    CONSTITUTIONAL:  non toxic-appearing, NAD NEURO:  Alert and oriented x 3, CN 3-12 grossly intact EYES:  eyes equal and reactive ENT/NECK:  Supple, no stridor  CARDIO:  normal rate,, appears well-perfused  PULM:  No respiratory distress,  GI/GU:  non-distended,  MSK/SPINE:  No gross deformities, no edema, moves all extremities  SKIN:  sore on left great toe as pictured, no significant warmth or erythema   *Additional and/or pertinent findings included in MDM below  Diagnostic and Interventional Summary    EKG Interpretation Date/Time:    Ventricular Rate:    PR Interval:    QRS Duration:    QT Interval:    QTC Calculation:   R Axis:      Text Interpretation:         Labs Reviewed  BASIC METABOLIC PANEL - Abnormal; Notable for the following components:      Result Value   Potassium 3.4 (*)    CO2 18 (*)    Glucose, Bld 298 (*)    Anion gap 19 (*)    All other components within normal limits  ETHANOL - Abnormal; Notable for the following components:   Alcohol, Ethyl (B) 11 (*)    All other components within normal limits  BETA-HYDROXYBUTYRIC ACID - Abnormal; Notable for the following components:    Beta-Hydroxybutyric Acid 0.30 (*)    All other components within normal limits  CBG MONITORING, ED - Abnormal; Notable for the following components:   Glucose-Capillary 276 (*)    All other components within normal limits  I-STAT VENOUS BLOOD GAS, ED - Abnormal; Notable for the following components:   pH, Ven 7.467 (*)    pCO2, Ven 34.5 (*)    pO2, Ven 53 (*)    Calcium, Ion 1.05 (*)    All other components within normal limits  CBC  HEPATIC FUNCTION PANEL    DG Foot Complete Left  Final Result      Medications  vancomycin (VANCOCIN) IVPB 1000 mg/200 mL premix (1,000 mg Intravenous New Bag/Given 04/12/23 0417)    Followed by  vancomycin (VANCOCIN) IVPB 1000 mg/200 mL premix (has no administration in time range)  piperacillin-tazobactam (ZOSYN) IVPB 3.375 g (0 g Intravenous Stopped 04/12/23 0415)     Procedures  /  Critical Care Procedures  ED Course and Medical Decision Making  I have reviewed the triage vital signs, the nursing notes, and pertinent available records from the EMR.  Social Determinants Affecting Complexity of Care: Patient has no clinically significant social determinants affecting this chief  complaint..   ED Course:    Medical Decision Making Patient here with toe pain on the left great toe.  Hx of diabetes.  X-ray in triage shows possible fracture vs osteo.  No hx of trauma.  Blood sugars have been running high.  Family states that he has been drinking a lot.    There isn't any significant warm or erythema to the foot.  There sore on the toe is dry.  ?Dry gangrene.    He has mildly elevated anion gap.  Question early DKA, will add VBG and beta-hydroxy.    Seen by and discussed with Dr. Madilyn Hook, who recommends admission.  Will start vanc and zosyn.  Amount and/or Complexity of Data Reviewed Labs: ordered.  Risk Prescription drug management. Decision regarding hospitalization.         Consultants: I consulted with Hospitalist, Dr. Lazarus Salines,  who is appreciated for admitting.   Treatment and Plan: Patient's exam and diagnostic results are concerning for osteomyelitis.  Feel that patient will need admission to the hospital for further treatment and evaluation.  Patient seen by and discussed with attending physician, Dr. Madilyn Hook, who recommends admission.  Concern for dry gangrene vs osteo.  Final Clinical Impressions(s) / ED Diagnoses     ICD-10-CM   1. Pain of toe of left foot  M79.675       ED Discharge Orders     None         Discharge Instructions Discussed with and Provided to Patient:   Discharge Instructions   None      Roxy Horseman, PA-C 04/12/23 0440    Tilden Fossa, MD 04/12/23 [phone]

## 2023-04-12 NOTE — Progress Notes (Signed)
ED Pharmacy Antibiotic Sign Off An antibiotic consult was received from an ED provider for vancomycin and zosyn per pharmacy dosing for osteomyelitis of foot. A chart review was completed to assess appropriateness.   The following one time order(s) were placed:  Zosyn 3.375g Vancomycin 2g  Further antibiotic and/or antibiotic pharmacy consults should be ordered by the admitting provider if indicated.   Thank you for allowing pharmacy to be a part of this patient's care.   Marja Kays, [facility]  Clinical Pharmacist 04/12/23 3:34 AM

## 2023-04-12 NOTE — H&P (Signed)
History and Physical    Patient: Zachary Lewis UJW:[id number] DOB: [birth year] DOA: 04/11/2023 DOS: the patient was seen and examined on 04/12/2023 PCP: Judy Pimple, MD  Patient coming from: Home  Chief Complaint:  Chief Complaint  Patient presents with   Foot Pain   HPI: Zachary Lewis is a 62 y.o. male with medical history significant of hypertension, hyperlipidemia, diabetes mellitus type 2, peripheral vascular disease, NSAID induced gastric ulcer, cirrhosis of the liver secondary to [facility], angiodysplasia of cecum, and obesity who presents with complaints of left foot pain.  He reports having a numbness feeling in his left foot that has been present for a couple of weeks weeks, but progressively worsening.  Describes it as a pins and needle sensation.  Denies any knowledge of any injury to the foot that he can recall.  He is followed by [facility] for onychomycosis of the foot when he had his nails and calluses trimmed back in September.  However over the last week he had noticed dark discoloration starting in his left foot on the medial aspect and thereafter reported having the nail fall off 2 days ago and dark appearance form at the tip of the left great toe.  Denies having any significant fevers, chills, cough, shortness of breath, nausea, vomiting, or diarrhea.  He has been putting Lotrimin cream on the foot.  Patient does admit to still smoking 5 cigarettes per day on average.  In the emergency department patient was noted to be afebrile with blood pressures elevated up to 155/96.  Labs significant for WBC 6.8 without differential, potassium 3.4, CO2 18, glucose 298, anion gap 19, and beta hydroxybutyrate acid 0.3.  X-rays of the left foot noted cortical deficit in discontinuity along the plantar aspect of the first digit concerning for possible fracture versus osteomyelitis.  Patient was started on empiric antibiotics of vancomycin and Zosyn  Review of Systems: As  mentioned in the history of present illness. All other systems reviewed and are negative. 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) 08/04/2018   Diabetes mellitus    Type II   Hyperlipidemia    Hypertension    NSAID-induced gastric ulcer 12/13/2018   EGD 11/2018 H pylori neg Omeprazole started   Obesity    Personal history of colonic adenomas 03/09/2013   Tinea pedis    Chronic   Past Surgical History:  Procedure Laterality Date   COLONOSCOPY     COLONOSCOPY W/ POLYPECTOMY  2014   IR PARACENTESIS  07/02/2018   IR TRANSCATHETER BX  07/17/2018   IR VENOGRAM HEPATIC WO HEMODYNAMIC EVALUATION  07/17/2018   myringectomy  06/2005   POLYPECTOMY     UMBILICAL HERNIA REPAIR  01/1997   VASECTOMY  07/2002   Social History:  reports that he has been smoking cigarettes. He has never used smokeless tobacco. He reports current alcohol use. He reports that he does not use drugs.  No Known Allergies  Family History  Problem Relation Age of Onset   Hypertension Other    Diabetes Neg Hx    Heart disease Neg Hx    Colon cancer Neg Hx    Stomach cancer Neg Hx    Pancreatic disease Neg Hx    Colon polyps Neg Hx    Esophageal cancer Neg Hx    Rectal cancer Neg Hx     Prior to  Admission medications   Medication Sig Start Date End Date Taking? Authorizing Provider  aspirin EC 81 MG tablet Take 1 tablet (81 mg total) by mouth daily. Swallow whole. 03/27/21  Yes Iran Ouch, MD  clopidogrel (PLAVIX) 75 MG tablet Take 1 tablet (75 mg total) by mouth daily. 02/25/23  Yes Iran Ouch, MD  clotrimazole-betamethasone (LOTRISONE) cream Apply 1 Application topically daily. 01/24/23  Yes Felecia Shelling, DPM  Evolocumab (REPATHA SURECLICK) 140 MG/ML SOAJ Inject 140 mg into the skin every 14 (fourteen) days. Patient taking differently: Inject 1 mL into the skin every 14 (fourteen) days. Every other Tuesday  09/23/22  Yes Iran Ouch, MD  insulin glargine (LANTUS SOLOSTAR) 100 UNIT/ML Solostar Pen Inject 10 Units into the skin daily. Plus sliding scale, max dose 50 units a day 10/10/22  Yes Motwani, Komal, MD  Semaglutide,0.25 or 0.5MG /DOS, 2 MG/3ML SOPN Inject 0.25 mg into the skin once a week. Patient taking differently: Inject 0.25 mg into the skin every Monday. 10/10/22  Yes Motwani, Carin Hock, MD  spironolactone (ALDACTONE) 50 MG tablet Take 1 tablet (50 mg total) by mouth daily. 02/08/22  Yes Tower, Audrie Gallus, MD  tadalafil (CIALIS) 20 MG tablet Take 1 tablet (20 mg total) by mouth daily as needed. 12/02/22  Yes   thiamine 100 MG tablet Take 1 tablet (100 mg total) by mouth daily. 06/02/18  Yes Leroy Sea, MD  Continuous Glucose Sensor (DEXCOM G7 SENSOR) MISC 1 Device by Does not apply route continuous. 10/10/22   Motwani, Carin Hock, MD  glucose blood (ONETOUCH VERIO) test strip 1 each by Other route daily. And lancets 1/day Patient not taking: Reported on 02/25/2023 01/26/21   Romero Belling, MD  Insulin Pen Needle 31G X 8 MM MISC Inject 1 Needle into the skin daily at 6 (six) AM. 12/27/22   Motwani, Komal, MD  latanoprost (XALATAN) 0.005 % ophthalmic solution Place 1 drop into both eyes at bedtime. Patient not taking: Reported on 04/12/2023 03/01/21   [provider]    Physical Exam: Vitals:   04/12/23 0358 04/12/23 0753 04/12/23 0756 04/12/23 0808  BP: (!) 155/96 (!) 150/95  (!) 150/95  Pulse: 97  88 88  Resp: 18   20  Temp:    98.1 F (36.7 C)  TempSrc:    Oral  SpO2: 96%  96% 96%  Weight:        Constitutional: Obese middle-age male currently in no acute distress Eyes: PERRL, lids and conjunctivae normal ENMT: Mucous membranes are moist.  Fair dentition.  Neck: normal, supple  Respiratory: clear to auscultation bilaterally, no wheezing, no crackles. Normal respiratory effort. No accessory muscle use.  Cardiovascular: Regular rate and rhythm, no murmurs / rubs / gallops. No  extremity edema.  Pulses 1+ in the left lower extremity.   Abdomen: no tenderness, no masses palpated. Bowel sounds positive.  Musculoskeletal: no clubbing / cyanosis. No joint deformity upper and lower extremities. Good ROM, no contractures.   Skin: Darkened appearance of the medial aspect of the side of the foot with darkened appearance of the tip of the left great toe where nail is falling off.  Also noted to have darkened appearance of the fifth toe and filed calluses present see below.    Neurologic: CN 2-12 grossly intact.  Abnormal sensation noted at the foot.  Able to move all extremities Psychiatric: Normal judgment and insight. Alert and oriented x 3. Normal mood.   Data Reviewed:  Reviewed labs, imaging, and  pertinent records as documented.  Assessment and Plan:  Diabetic foot infection Onychomycosis Acute.  Patient presents with left foot numbness for the last couple weeks.  Notes that the nail of great toe fell off 2 days ago and he has developed a darkening appearance of the foot.  Denies any prior stenting of his legs.  Patient treated for onychomycosis in the outpatient setting with [facility].  Question possibility of traumatic injury versus cellulitis of foot versus possibility of osteomyelitis. -Admit to medical telemetry bed -Check ESR, CRP -Check ABI with TBI -Check MRI of the foot -Continue empiric antibiotics of vancomycin and Zosyn -Gabapentin 100 mg 3 times daily.  Adjust dose as needed if found to help with suspected neuropathy  Uncontrolled diabetes mellitus type 2 with hyperglycemia   On admission glucose elevated at 298 with elevated anion gap of 19, but beta hydroxybutyrate 0.3.  Last hemoglobin A1c was noted to be 11 when checked on 09/27/2022.  Home medication regimen includes Lantus 10 units daily and Ozempic. -Hypoglycemic protocols -Check hemoglobin A1c -Pharmacy substitution of Semglee 15units daily -CBGs before every meal with moderate  SSI -Adjust insulin regimen as needed.  Peripheral vascular disease Patient has a history of claudication in both legs followed by Dr. Keturah Barre of cardiology in outpatient setting.  ABI noted 0.81 on the right and 0.72 on the left back in 01/2021. -Continue aspirin and Plavix  Hypokalemia Acute.  Potassium noted to be 3.4. -Patient was given 40 meq of potassium chloride p.o. -Continue to monitor and replace as needed  Essential hypertension Blood pressures elevated up to 163/89.  Patient on spironolactone for blood pressure management. -Continue spironolactone -Hydralazine IV as needed for elevated blood pressures  Liver cirrhosis secondary to NASH LFTs appear to be within normal limits.  Hyperlipidemia Patient is intolerant to statins and therefore had been started on Repatha in the outpatient setting.  Last LDL noted to be 63 on Repatha. -Continue Repatha in outpatient setting  Tobacco abuse Patient reports smoking 5 cigarettes/day on average.  Notes willingness to try and quit. -Nicotine patch offered -Continue to counsel and encourage cessation of tobacco use  Obesity BMI 32.74 kg/m  DVT prophylaxis: Lovenox Advance Care Planning:   Code Status: Full Code  Consults: Podiatry  Family Communication: Daughter updated over the phone  Severity of Illness: The appropriate patient status for this patient is INPATIENT. Inpatient status is judged to be reasonable and necessary in order to provide the required intensity of service to ensure the patient's safety. The patient's presenting symptoms, physical exam findings, and initial radiographic and laboratory data in the context of their chronic comorbidities is felt to place them at high risk for further clinical deterioration. Furthermore, it is not anticipated that the patient will be medically stable for discharge from the hospital within 2 midnights of admission.   * I certify that at the point of admission it is my clinical  judgment that the patient will require inpatient hospital care spanning beyond 2 midnights from the point of admission due to high intensity of service, high risk for further deterioration and high frequency of surveillance required.*  Author: Clydie Braun, MD 04/12/2023 9:21 AM  For on call review [email or web address].

## 2023-04-12 NOTE — ED Notes (Signed)
ED Provider at bedside. 

## 2023-04-12 NOTE — Progress Notes (Signed)
PHARMACY ANTIBIOTIC CONSULT NOTE   Zachary Lewis a 62 y.o. male admitted with DFI with possible osteo on foot X-ray.  Pharmacy has been consulted for Vancomycin dosing.  Of note, patient received a 2g Vancomycin load at ~0500 11/23.   11/22: Scr 0.78, WBC 6.8  Vital Signs: afebrile, HR elevated, BP elevated  Estimated Creatinine Clearance: 100.4 mL/min (by C-G formula based on SCr of 0.78 mg/dL).  Plan: START Zosyn 3.375 g IV Q8H (EI)  Vancomycin 1,000 mg IV Q12H (Scr used: 0.8, Vd used: 0.72, eAUC: 402) Monitor renal function, clinical status, de-escalation, C/S, levels as indicated   Allergies:  No Known Allergies  Filed Weights   04/12/23 0300  Weight: 92 kg (202 lb 13.2 oz)       Latest Ref Rng & Units 04/12/2023    3:50 AM 04/11/2023    8:14 PM 09/27/2022    8:23 AM  CBC  WBC 4.0 - 10.5 K/uL  6.8  4.6   Hemoglobin 13.0 - 17.0 g/dL 40.9  81.1  91.4   Hematocrit 39.0 - 52.0 % 47.0  48.2  45.7   Platelets 150 - 400 K/uL  258  237.0     Antibiotics Given (last 72 hours)     Date/Time Action Medication Dose Rate   04/12/23 0356 New Bag/Given   piperacillin-tazobactam (ZOSYN) IVPB 3.375 g 3.375 g 100 mL/hr   04/12/23 0417 New Bag/Given   vancomycin (VANCOCIN) IVPB 1000 mg/200 mL premix 1,000 mg 200 mL/hr   04/12/23 0525 New Bag/Given   vancomycin (VANCOCIN) IVPB 1000 mg/200 mL premix 1,000 mg 200 mL/hr       Antimicrobials this admission: Vancomycin 11/23>>c Zosyn 11/23>>c   Microbiology results: None  Thank you for allowing pharmacy to be a part of this patient's care.  Jani Gravel, PharmD Clinical Pharmacist  04/12/2023 10:50 AM

## 2023-04-12 NOTE — ED Notes (Signed)
Missed IV attempt x1 to right hand with 22 gauge IV. IV unsuccessful. Blood obtained and sent to lab for processing.

## 2023-04-12 NOTE — Plan of Care (Signed)

## 2023-04-12 NOTE — ED Notes (Signed)
ED TO INPATIENT HANDOFF REPORT  ED Nurse Name and Phone #: Shamell Hittle [phone]   S Name/Age/Gender Zachary Lewis 62 y.o. male Room/Bed: 041C/041C  Code Status   Code Status: Prior  Home/SNF/Other Home Patient oriented to: self, place, time, and situation Is this baseline? Yes   Triage Complete: Triage complete  Chief Complaint Diabetic foot infection (HCC) [U04.540, L08.9]  Triage Note Pt is coming in for left foot pain, more specifically in the left toe but does endorse he feels it in the entire left foot. The pain in his foot has been going on for 2 weeks but in the left few days he hyas had swelling in his left big toe and in the last day the toenail has fallen off the left big toe. There is potentially some mild discoloration in the left foot but is able to more all digits. He has no other complaints at this time, he is diabetic.    Allergies No Known Allergies  Level of Care/Admitting Diagnosis ED Disposition     ED Disposition  Admit   Condition  --   Comment  Hospital Area: [facility] [[id number]]  Level of Care: Telemetry Medical [104]  May admit patient to [facility] or [facility] if equivalent level of care is available:: No  Covid Evaluation: Asymptomatic - no recent exposure (last 10 days) testing not required  Diagnosis: Diabetic foot infection [facility]) [[id number]]  Admitting Physician: Clydie Braun [[id number]]  Attending Physician: Clydie Braun [[id number]]  Certification:: I certify this patient will need inpatient services for at least 2 midnights  Expected Medical Readiness: 04/14/2023          B Medical/Surgery History 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) 08/04/2018   Diabetes mellitus    Type II   Hyperlipidemia    Hypertension    NSAID-induced gastric ulcer 12/13/2018   EGD 11/2018 H pylori neg Omeprazole started   Obesity     Personal history of colonic adenomas 03/09/2013   Tinea pedis    Chronic   Past Surgical History:  Procedure Laterality Date   COLONOSCOPY     COLONOSCOPY W/ POLYPECTOMY  2014   IR PARACENTESIS  07/02/2018   IR TRANSCATHETER BX  07/17/2018   IR VENOGRAM HEPATIC WO HEMODYNAMIC EVALUATION  07/17/2018   myringectomy  06/2005   POLYPECTOMY     UMBILICAL HERNIA REPAIR  01/1997   VASECTOMY  07/2002     A IV Location/Drains/Wounds Patient Lines/Drains/Airways Status     Active Line/Drains/Airways     Name Placement date Placement time Site Days   Peripheral IV 04/12/23 20 G Right Antecubital 04/12/23  0340  Antecubital  less than 1   Incision (Closed) 07/17/18 Neck Right 07/17/18  0930  -- 1730            Intake/Output Last 24 hours  Intake/Output Summary (Last 24 hours) at 04/12/2023 7846 Last data filed at 04/12/2023 0631 Gross per 24 hour  Intake 450 ml  Output --  Net 450 ml    Labs/Imaging Results for orders placed or performed during the hospital encounter of 04/11/23 (from the past 48 hour(s))  CBG monitoring, ED     Status: Abnormal   Collection Time: 04/11/23  7:47 PM  Result Value Ref Range   Glucose-Capillary 276 (H) 70 - 99 mg/dL    Comment: Glucose reference range applies  only to samples taken after fasting for at least 8 hours.  CBC     Status: None   Collection Time: 04/11/23  8:14 PM  Result Value Ref Range   WBC 6.8 4.0 - 10.5 K/uL   RBC 4.83 4.22 - 5.81 MIL/uL   Hemoglobin 16.4 13.0 - 17.0 g/dL   HCT 19.1 47.8 - 29.5 %   MCV 99.8 80.0 - 100.0 fL   MCH 34.0 26.0 - 34.0 pg   MCHC 34.0 30.0 - 36.0 g/dL   RDW 62.1 30.8 - 65.7 %   Platelets 258 150 - 400 K/uL   nRBC 0.0 0.0 - 0.2 %    Comment: Performed at [facility], [address], [place], [place] [address]  Basic metabolic panel     Status: Abnormal   Collection Time: 04/11/23  8:14 PM  Result Value Ref Range   Sodium 137 135 - 145 mmol/L   Potassium 3.4 (L) 3.5 - 5.1 mmol/L    Chloride 100 98 - 111 mmol/L   CO2 18 (L) 22 - 32 mmol/L   Glucose, Bld 298 (H) 70 - 99 mg/dL    Comment: Glucose reference range applies only to samples taken after fasting for at least 8 hours.   BUN 12 8 - 23 mg/dL   Creatinine, Ser 2.95 0.61 - 1.24 mg/dL   Calcium 9.5 8.9 - 28.4 mg/dL   GFR, Estimated >13 >24 mL/min    Comment: (NOTE) Calculated using the CKD-EPI Creatinine Equation (2021)    Anion gap 19 (H) 5 - 15    Comment: Performed at [facility], [address] [place], [place] [address]  Ethanol     Status: Abnormal   Collection Time: 04/12/23  1:24 AM  Result Value Ref Range   Alcohol, Ethyl (B) 11 (H) <10 mg/dL    Comment: (NOTE) Lowest detectable limit for serum alcohol is 10 mg/dL.  For medical purposes only. Performed at [facility], [address], [place], [place] [address]   Hepatic function panel     Status: None   Collection Time: 04/12/23  1:24 AM  Result Value Ref Range   Total Protein 7.8 6.5 - 8.1 g/dL   Albumin 3.9 3.5 - 5.0 g/dL   AST 30 15 - 41 U/L   ALT 32 0 - 44 U/L   Alkaline Phosphatase 118 38 - 126 U/L   Total Bilirubin 0.6 <1.2 mg/dL   Bilirubin, Direct 0.1 0.0 - 0.2 mg/dL   Indirect Bilirubin 0.5 0.3 - 0.9 mg/dL    Comment: Performed at [facility], [address], [place], [place] [address]  Beta-hydroxybutyric acid     Status: Abnormal   Collection Time: 04/12/23  1:24 AM  Result Value Ref Range   Beta-Hydroxybutyric Acid 0.30 (H) 0.05 - 0.27 mmol/L    Comment: Performed at [facility], [address], [place], [place] [address]  I-Stat venous blood gas, ED     Status: Abnormal   Collection Time: 04/12/23  3:50 AM  Result Value Ref Range   pH, Ven 7.467 (H) 7.25 - 7.43   pCO2, Ven 34.5 (L) 44 - 60 mmHg   pO2, Ven 53 (H) 32 - 45 mmHg   Bicarbonate 24.9 20.0 - 28.0 mmol/L   TCO2 26 22 - 32 mmol/L   O2 Saturation 89 %   Acid-Base Excess 2.0 0.0 - 2.0 mmol/L   Sodium 135 135 - 145 mmol/L  Potassium 4.0  3.5 - 5.1 mmol/L   Calcium, Ion 1.05 (L) 1.15 - 1.40 mmol/L   HCT 47.0 39.0 - 52.0 %   Hemoglobin 16.0 13.0 - 17.0 g/dL   Sample type VENOUS   CBG monitoring, ED     Status: Abnormal   Collection Time: 04/12/23  7:51 AM  Result Value Ref Range   Glucose-Capillary 291 (H) 70 - 99 mg/dL    Comment: Glucose reference range applies only to samples taken after fasting for at least 8 hours.   Comment 1 Notify RN    Comment 2 Document in Chart    DG Foot Complete Left  Result Date: 04/11/2023 CLINICAL DATA:  Left great toe pain. EXAM: LEFT FOOT - COMPLETE 3+ VIEW COMPARISON:  None Available. FINDINGS: Cortical defect in discontinuity are present along the plantar aspect of the first digit. No dislocation is seen degenerative changes are present at the first metatarsophalangeal joint, midfoot, and hindfoot. Vascular calcifications are present in the soft tissues. Soft tissue swelling is noted at the first digit. IMPRESSION: Cortical defect and discontinuity along the plantar aspect of the first digit, possible fracture or osteomyelitis in the appropriate clinical setting. Electronically Signed   By: Thornell Sartorius M.D.   On: 04/11/2023 21:19    Pending Labs Unresulted Labs (From admission, onward)    None       Vitals/Pain Today's Vitals   04/12/23 0358 04/12/23 0753 04/12/23 0756 04/12/23 0808  BP: (!) 155/96 (!) 150/95  (!) 150/95  Pulse: 97  88 88  Resp: 18   20  Temp:    98.1 F (36.7 C)  TempSrc:    Oral  SpO2: 96%  96% 96%  Weight:      PainSc:        Isolation Precautions No active isolations  Medications Medications  piperacillin-tazobactam (ZOSYN) IVPB 3.375 g (0 g Intravenous Stopped 04/12/23 0415)  vancomycin (VANCOCIN) IVPB 1000 mg/200 mL premix (0 mg Intravenous Stopped 04/12/23 0523)    Followed by  vancomycin (VANCOCIN) IVPB 1000 mg/200 mL premix (0 mg Intravenous Stopped 04/12/23 0631)    Mobility walks     Focused Assessments Musculoskeletal     R Recommendations: See Admitting Provider Note  Report given to:   Additional Notes:

## 2023-04-12 NOTE — ED Notes (Signed)
Called lab to add on beta-hydroxybutyric acid.

## 2023-04-13 ENCOUNTER — Inpatient Hospital Stay (HOSPITAL_COMMUNITY): Payer: No Typology Code available for payment source

## 2023-04-13 DIAGNOSIS — E11628 Type 2 diabetes mellitus with other skin complications: Secondary | ICD-10-CM

## 2023-04-13 DIAGNOSIS — L089 Local infection of the skin and subcutaneous tissue, unspecified: Secondary | ICD-10-CM

## 2023-04-13 DIAGNOSIS — M79673 Pain in unspecified foot: Secondary | ICD-10-CM | POA: Diagnosis not present

## 2023-04-13 LAB — CBC
HCT: 41.9 % (ref 39.0–52.0)
Hemoglobin: 14.2 g/dL (ref 13.0–17.0)
MCH: 33.5 pg (ref 26.0–34.0)
MCHC: 33.9 g/dL (ref 30.0–36.0)
MCV: 98.8 fL (ref 80.0–100.0)
Platelets: 213 10*3/uL (ref 150–400)
RBC: 4.24 MIL/uL (ref 4.22–5.81)
RDW: 13 % (ref 11.5–15.5)
WBC: 5.7 10*3/uL (ref 4.0–10.5)
nRBC: 0 % (ref 0.0–0.2)

## 2023-04-13 LAB — BASIC METABOLIC PANEL
Anion gap: 8 (ref 5–15)
BUN: 12 mg/dL (ref 8–23)
CO2: 25 mmol/L (ref 22–32)
Calcium: 8.7 mg/dL — ABNORMAL LOW (ref 8.9–10.3)
Chloride: 100 mmol/L (ref 98–111)
Creatinine, Ser: 1.11 mg/dL (ref 0.61–1.24)
GFR, Estimated: >60 mL/min (ref >60)
Glucose, Bld: 258 mg/dL — ABNORMAL HIGH (ref 70–99)
Potassium: 4.3 mmol/L (ref 3.5–5.1)
Sodium: 133 mmol/L — ABNORMAL LOW (ref 135–145)

## 2023-04-13 LAB — GLUCOSE, CAPILLARY
Glucose-Capillary: 307 mg/dL — ABNORMAL HIGH (ref 70–99)
Glucose-Capillary: 258 mg/dL — ABNORMAL HIGH (ref 70–99)
Glucose-Capillary: 290 mg/dL — ABNORMAL HIGH (ref 70–99)
Glucose-Capillary: 215 mg/dL — ABNORMAL HIGH (ref 70–99)

## 2023-04-13 MED ORDER — NALOXONE HCL 0.4 MG/ML IJ SOLN: 0.4000 mg | INTRAMUSCULAR | Status: DC | PRN

## 2023-04-13 MED ORDER — DOXYCYCLINE HYCLATE 100 MG PO TABS
100.0000 mg | ORAL_TABLET | Freq: Two times a day (BID) | ORAL | Status: DC
  Administered 2023-04-13 – 2023-04-15 (×4): 100 mg via ORAL
  Filled 2023-04-13 (×4): qty 1

## 2023-04-13 MED ORDER — OXYCODONE HCL 5 MG PO TABS
5.0000 mg | ORAL_TABLET | Freq: Four times a day (QID) | ORAL | Status: DC | PRN
  Administered 2023-04-13 (×2): 5 mg via ORAL
  Filled 2023-04-13 (×2): qty 1

## 2023-04-13 MED ORDER — AMOXICILLIN-POT CLAVULANATE 875-125 MG PO TABS
1.0000 | ORAL_TABLET | Freq: Two times a day (BID) | ORAL | Status: DC
  Administered 2023-04-13 – 2023-04-15 (×5): 1 via ORAL
  Filled 2023-04-13 (×5): qty 1

## 2023-04-13 NOTE — Progress Notes (Signed)
VASCULAR LAB    ABI has been performed.  See CV proc for preliminary results.   Duane Trias, RVT 04/13/2023, 5:10 PM

## 2023-04-13 NOTE — Assessment & Plan Note (Signed)
Nicotine patch is available to the patient on an as needed basis.

## 2023-04-13 NOTE — Consult Note (Signed)
PODIATRY CONSULTATION  NAME Zachary Lewis MRN [id number] DOB 02/28/1961 DOA 04/11/2023   Reason for consult:  Chief Complaint  Patient presents with   Foot Pain    Consulting physician: Madelyn Flavors MD, [facility]  History of present illness: 62 y.o. male PMHx T2DM, HTN, hyperlipidemia, liver cirrhosis, 1/2ppd smoker admitted for worsening pain and numbness to the left foot.  Concern for critical limb ischemia.  ABIs ordered and pending.  Podiatry consulted.  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) 08/04/2018   Diabetes mellitus    Type II   Hyperlipidemia    Hypertension    NSAID-induced gastric ulcer 12/13/2018   EGD 11/2018 H pylori neg Omeprazole started   Obesity    Personal history of colonic adenomas 03/09/2013   Tinea pedis    Chronic       Latest Ref Rng & Units 04/13/2023    5:33 AM 04/12/2023   11:09 AM 04/12/2023    3:50 AM  CBC  WBC 4.0 - 10.5 K/uL 5.7  7.4    Hemoglobin 13.0 - 17.0 g/dL 21.3  08.6  57.8   Hematocrit 39.0 - 52.0 % 41.9  44.3  47.0   Platelets 150 - 400 K/uL 213  247         Latest Ref Rng & Units 04/13/2023    5:33 AM 04/12/2023   11:09 AM 04/12/2023    3:50 AM  BMP  Glucose 70 - 99 mg/dL 469  629    BUN 8 - 23 mg/dL 12  11    Creatinine 5.28 - 1.24 mg/dL 4.13  2.44    Sodium 010 - 145 mmol/L 133  132  135   Potassium 3.5 - 5.1 mmol/L 4.3  4.0  4.0   Chloride 98 - 111 mmol/L 100  95    CO2 22 - 32 mmol/L 25  23    Calcium 8.9 - 10.3 mg/dL 8.7  9.0      LT foot 27/25/3664   Physical Exam: General: The patient is alert and oriented x3 in no acute distress.   Dermatology: Small focal area of gangrenous changes noted to the left hallux.  Please see above noted photo.  Clinically no concern for underlying cellulitis or abscess.  It appears very stable and dry  Vascular: Left foot cool to touch compared to the  contralateral limb.  No palpable pulses LT  Neurological: Light touch and protective threshold diminished bilaterally.   Musculoskeletal Exam: No structural deformity noted.  Patient ambulatory.  No prior amputations    ASSESSMENT/PLAN OF CARE Ischemic changes with concern for PAD left foot  -Patient evaluated -Condition likely due to critical limb ischemia left foot over infection/OM -ABIs pending -MRI LT foot pending final read -Will follow    Thank you for the consult.  Please contact me directly via secure chat with any questions or concerns.     Felecia Shelling, DPM [facility]  Dr. Felecia Shelling, DPM    [address], [place] [address]                Office [phone]  Fax [phone]

## 2023-04-13 NOTE — Assessment & Plan Note (Signed)
The patient is not on any statins at home. Will check lipid panel.

## 2023-04-13 NOTE — Assessment & Plan Note (Signed)
ABI's have been ordered by podiatry.

## 2023-04-13 NOTE — Assessment & Plan Note (Signed)
As per podiatry

## 2023-04-13 NOTE — Assessment & Plan Note (Signed)
Concern for osteomyelitis and dry gangrene. Podiatry has been consulted and has ordered an ABI. The patient is receiving IV Zosyn and Vancomycin. This has been changed to oral augmentin and doxycycline today.

## 2023-04-13 NOTE — Assessment & Plan Note (Signed)
The patient is normotensive. There are no antihypertensive medications among his home meds on the medication reconciliation.

## 2023-04-13 NOTE — Plan of Care (Signed)
  Problem: Education: Goal: Ability to describe self-care measures that may prevent or decrease complications (Diabetes Survival Skills Education) will improve Outcome: Progressing   

## 2023-04-13 NOTE — Assessment & Plan Note (Signed)
Noted  

## 2023-04-13 NOTE — Assessment & Plan Note (Signed)
Resolved

## 2023-04-13 NOTE — Assessment & Plan Note (Signed)
At home the patient's glucoses have been controlled on Semglee 10 units sub Q daily and semaglutide. He has been continued on [facility]

## 2023-04-13 NOTE — Plan of Care (Signed)

## 2023-04-13 NOTE — Progress Notes (Signed)
Progress Note   Patient: Zachary Lewis ZOX:[id number] DOB: 1961-02-09 DOA: 04/11/2023     1 DOS: the patient was seen and examined on 04/13/2023   Brief hospital course: The patient is a 62 yr old man who presented to [facility] ED with complaints of left foot pain. 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  In the last week the patient had noticed that the left foot had become dusky appearing on the medial aspect. The left great toenail fell off.  In the ED he was found to have elevated BP of 155/96. CO2 was 18. Geni Bers of the let foot demonstrated cortical deficit in discontinuity along the plantar aspect of the first digit concerning fo rpossible fracture versus osteomyelitis.  Podiatry was consulted and the patient would be started on empiric vancomycin and zosyn.   The patient was evaluated by podiatry this morning. Due to the patient's cool left extremity, there is concern for limb ischemia. ABI's have been ordered.  Assessment and Plan: Diabetic foot infection (HCC) Concern for osteomyelitis and dry gangrene. Podiatry has been consulted and has ordered an ABI. The patient is receiving IV Zosyn and Vancomycin. This has been changed to oral augmentin and doxycycline today.  Onychomycosis As per podiatry.  Uncontrolled type 2 diabetes mellitus with hyperglycemia, with long-term current use of insulin (HCC) At home the patient's glucoses have been controlled on Semglee 10 units sub Q daily and semaglutide. He has been continued on SemGlee  PVD (peripheral vascular disease) (HCC) ABI's have been ordered by podiatry.  Hypokalemia Resolved.   HYPERTENSION, BENIGN ESSENTIAL The patient is normotensive. There are no  antihypertensive medications among his home meds on the medication reconciliation.  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) Noted.   Tobacco abuse Nicotine patch is available to the patient on an as needed basis.  Hyperlipidemia The patient is not on any statins at home. Will check lipid panel.     Subjective: The patient is resting comfortably. No new complaints.  Physical Exam: Vitals:   04/12/23 1641 04/12/23 2218 04/13/23 0500 04/13/23 0857  BP: (!) 155/85 (!) 120/98  127/89  Pulse: 100 82  78  Resp:  18 19 18   Temp: 98 F (36.7 C) 98.3 F (36.8 C)  98.5 F (36.9 C)  TempSrc: Oral Oral  Oral  SpO2: 98% 96%  100%  Weight:       Exam:  Constitutional:  The patient is awake, alert, and oriented x 3. No acute distress. Respiratory:  No increased work of breathing. No wheezes, rales, or rhonchi No tactile fremitus Cardiovascular:  Regular rate and rhythm No murmurs, ectopy, or gallups. No lateral PMI. No thrills. Abdomen:  Abdomen is soft, non-tender, non-distended No hernias, masses, or organomegaly Normoactive bowel sounds.  Musculoskeletal:  No cyanosis, clubbing, or edema The left foot is bandaged. Skin:  No rashes, lesions, ulcers palpation of skin: no induration or nodules Neurologic:  CN 2-12 intact Sensation all 4 extremities intact Psychiatric:  Mental status Mood, affect appropriate Orientation to person, place, time  judgment and insight appear intact  Family Communication: None available  Disposition: Status is: Inpatient Remains inpatient appropriate because: Patient requires work up  and treatment for limb ischemia.   Planned Discharge Destination:  Home    Time spent: 34 minutes  Author: Orvilla Truett, DO 04/13/2023 2:15 PM  For on call review [email or web address].

## 2023-04-14 DIAGNOSIS — L089 Local infection of the skin and subcutaneous tissue, unspecified: Secondary | ICD-10-CM | POA: Diagnosis not present

## 2023-04-14 DIAGNOSIS — E11628 Type 2 diabetes mellitus with other skin complications: Secondary | ICD-10-CM | POA: Diagnosis not present

## 2023-04-14 LAB — GLUCOSE, CAPILLARY
Glucose-Capillary: 218 mg/dL — ABNORMAL HIGH (ref 70–99)
Glucose-Capillary: 201 mg/dL — ABNORMAL HIGH (ref 70–99)
Glucose-Capillary: 124 mg/dL — ABNORMAL HIGH (ref 70–99)
Glucose-Capillary: 194 mg/dL — ABNORMAL HIGH (ref 70–99)

## 2023-04-14 LAB — CBC WITH DIFFERENTIAL/PLATELET
Abs Immature Granulocytes: 0.02 10*3/uL (ref 0.00–0.07)
Basophils Absolute: 0.1 10*3/uL (ref 0.0–0.1)
Basophils Relative: 1 %
Eosinophils Absolute: 0.5 10*3/uL (ref 0.0–0.5)
Eosinophils Relative: 9 %
HCT: 42.1 % (ref 39.0–52.0)
Hemoglobin: 14.3 g/dL (ref 13.0–17.0)
Immature Granulocytes: 0 %
Lymphocytes Relative: 26 %
Lymphs Abs: 1.5 10*3/uL (ref 0.7–4.0)
MCH: 33.3 pg (ref 26.0–34.0)
MCHC: 34 g/dL (ref 30.0–36.0)
MCV: 98.1 fL (ref 80.0–100.0)
Monocytes Absolute: 0.7 10*3/uL (ref 0.1–1.0)
Monocytes Relative: 12 %
Neutro Abs: 3 10*3/uL (ref 1.7–7.7)
Neutrophils Relative %: 52 %
Platelets: 220 10*3/uL (ref 150–400)
RBC: 4.29 MIL/uL (ref 4.22–5.81)
RDW: 12.8 % (ref 11.5–15.5)
WBC: 5.8 10*3/uL (ref 4.0–10.5)
nRBC: 0 % (ref 0.0–0.2)

## 2023-04-14 LAB — BASIC METABOLIC PANEL
Anion gap: 11 (ref 5–15)
BUN: 13 mg/dL (ref 8–23)
CO2: 22 mmol/L (ref 22–32)
Calcium: 9 mg/dL (ref 8.9–10.3)
Chloride: 97 mmol/L — ABNORMAL LOW (ref 98–111)
Creatinine, Ser: 0.73 mg/dL (ref 0.61–1.24)
GFR, Estimated: >60 mL/min (ref >60)
Glucose, Bld: 200 mg/dL — ABNORMAL HIGH (ref 70–99)
Potassium: 4.3 mmol/L (ref 3.5–5.1)
Sodium: 130 mmol/L — ABNORMAL LOW (ref 135–145)

## 2023-04-14 NOTE — Inpatient Diabetes Management (Signed)
Inpatient Diabetes Program Recommendations  AACE/ADA: New Consensus Statement on Inpatient Glycemic Control (2015)  Target Ranges:  Prepandial:   less than 140 mg/dL      Peak postprandial:   less than 180 mg/dL (1-2 hours)      Critically ill patients:  140 - 180 mg/dL   Lab Results  Component Value Date   GLUCAP 218 (H) 04/14/2023   HGBA1C 10.8 (H) 04/12/2023    Latest Reference Range & Units 09/27/22 08:23 04/12/23 11:09  Hemoglobin A1C 4.8 - 5.6 % 11.0 (H) 10.8 (H)  (H): Data is abnormally high  Review of Glycemic Control  Diabetes history: DM2 Outpatient Diabetes medications: Lantus 10 units Current orders for Inpatient glycemic control: Semglee 15 units daily, Novolog 0-15 units tid, 0-5 units hs  Inpatient Diabetes Program Recommendations:   Noted A1c decreased from 11.0 on 09/27/22 to currently 10.8. Please consider Novolog 3 units tid meal coverage if postprandial CBGs >180. Will follow during hospitalization.  Thank you, Zachary Lewis. Sarayah Bacchi, RN, MSN, CDCES  Diabetes Coordinator Inpatient Glycemic Control Team Team Pager [phone] (8am-5pm) 04/14/2023 11:25 AM

## 2023-04-14 NOTE — TOC CM/SW Note (Signed)
Transition of Care [facility]) - Inpatient Brief Assessment   Patient Details  Name: Zachary Lewis MRN: [id number] Date of Birth: [birth year]  Transition of Care [facility]) CM/SW Contact:    Tom-Johnson, Hershal Coria, RN Phone Number: 04/14/2023, 5:27 PM   Clinical Narrative:  Patient presented to the ED with Diabetic Great Toe Pain. Podiatry and Vascular following.   From home with wife, has five supportive children. Employed, independent with care and drive self prior to admission. Does not have DME at home.  PCP is [facility], Audrie Gallus, MD and uses [employer] on [address] [facility].   No TOC needs or recommendations noted at this time.  Patient not Medically ready for discharge.  CM will continue to follow as patient progresses with care towards discharge.            Transition of Care Asessment: Insurance and Status: Insurance coverage has been reviewed Patient has primary care physician: Yes Home environment has been reviewed: Yes Prior level of function:: Independent Prior/Current Home Services: No current home services Social Determinants of Health Reivew: SDOH reviewed no interventions necessary Readmission risk has been reviewed: Yes Transition of care needs: transition of care needs identified, TOC will continue to follow

## 2023-04-14 NOTE — Progress Notes (Signed)
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 ED with complaints of left foot pain - more acute over the past week prior to admission.  Assessment & Plan:   Principal Problem:   Diabetic foot infection (HCC) Active Problems:   Onychomycosis   Uncontrolled type 2 diabetes mellitus with hyperglycemia, with long-term current use of insulin (HCC)   PVD (peripheral vascular disease) (HCC)   Hypokalemia   HYPERTENSION, BENIGN ESSENTIAL   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)   Hyperlipidemia   Tobacco abuse   Obesity (BMI 30-39.9)  Diabetic foot infection (HCC) Rule out osteomyelitis versus dry gangrene Sepsis criteria met at admission with tachycardia, tachypnea as well as infectious source, POA -Podiatry discussed abnormal ABI, consulting vascular surgery for possible intervention -amputation is still being considered -Concern for osteomyelitis and dry gangrene. Podiatry has been consulted and has ordered an ABI. The patient is receiving IV Zosyn and Vancomycin. This has been changed to oral augmentin and doxycycline today.   Onychomycosis As per podiatry.   Uncontrolled type 2 diabetes mellitus with hyperglycemia, with long-term current use of insulin (HCC) At home the patient's glucoses have been controlled on Semglee 10 units sub Q daily and semaglutide. He has been continued on SemGlee   PVD (peripheral vascular disease) (HCC) ABI's have been ordered by podiatry.   Hypokalemia Resolved.    Hypertension, essential Transiently elevated at intake, not currently on any medications   Cirrhosis of liver with ascites  NASH vs ASH or both (HCC) Noted.    Tobacco abuse Nicotine patch is available to the patient on an as needed basis.   Hyperlipidemia -No recent lipid panel, previous shows elevated cholesterol and LDL   DVT prophylaxis: enoxaparin (LOVENOX) injection 40 mg Start: 04/12/23 1000 Code Status:   Code Status: Full Code Family Communication: None present  Status is: Inpatient  Dispo: The patient is from: Home              Anticipated d/c is to: To be determined              Anticipated d/c date is: 48 to 72 hours pending clinical course              Patient currently not medically stable for discharge  Consultants:  Podiatry, vascular surgery  Procedures:  Pending above  Antimicrobials:  Augmentin, doxycycline  Subjective: No acute issues or events, pain currently well-controlled denies nausea vomiting diarrhea constipation headache fevers chills or chest pain  Objective: Vitals:   04/13/23 1630 04/13/23 2020 04/14/23 0013 04/14/23 0552  BP: (!) 157/102 104/78 129/85 114/75  Pulse: 84 91 74 79  Resp: 20 18 18 18   Temp: 98.4 F (36.9 C) 98.7 F (37.1 C) 98.3 F (36.8 C) 97.9 F (36.6 C)  TempSrc: Oral Oral    SpO2: 100% 100% 99% 100%  Weight:        Intake/Output Summary (Last 24 hours) at 04/14/2023 0748 Last data filed at 04/13/2023 1739 Gross per 24 hour  Intake 890 ml  Output 0 ml  Net 890 ml   Filed Weights   04/12/23 0300 04/12/23 1031  Weight: 92 kg  92.8 kg    Examination:  General exam: Appears calm and comfortable  Respiratory system: Clear to auscultation. Respiratory effort normal. Cardiovascular system: S1 & S2 heard, RRR. No JVD, murmurs, rubs, gallops or clicks. No pedal edema. Gastrointestinal system: Abdomen is nondistended, soft and nontender. No organomegaly or masses felt. Normal bowel sounds heard. Central nervous system: Alert and oriented. No focal neurological deficits. Extremities: Symmetric 5 x 5 power. Skin: No rashes, lesions or  ulcers Psychiatry: Judgement and insight appear normal. Mood & affect appropriate.     Data Reviewed: I have personally reviewed following labs and imaging studies  CBC: Recent Labs  Lab 04/11/23 2014 04/12/23 0350 04/12/23 1109 04/13/23 0533 04/14/23 0421  WBC 6.8  --  7.4 5.7 5.8  NEUTROABS  --   --  4.6  --  3.0  HGB 16.4 16.0 15.5 14.2 14.3  HCT 48.2 47.0 44.3 41.9 42.1  MCV 99.8  --  96.5 98.8 98.1  PLT 258  --  247 213 220   Basic Metabolic Panel: Recent Labs  Lab 04/11/23 2014 04/12/23 0350 04/12/23 1109 04/13/23 0533 04/14/23 0421  NA 137 135 132* 133* 130*  K 3.4* 4.0 4.0 4.3 4.3  CL 100  --  95* 100 97*  CO2 18*  --  23 25 22   GLUCOSE 298*  --  312* 258* 200*  BUN 12  --  11 12 13   CREATININE 0.78  --  0.86 1.11 0.73  CALCIUM 9.5  --  9.0 8.7* 9.0   GFR: Estimated Creatinine Clearance: 100.8 mL/min (by C-G formula based on SCr of 0.73 mg/dL). Liver Function Tests: Recent Labs  Lab 04/12/23 0124  AST 30  ALT 32  ALKPHOS 118  BILITOT 0.6  PROT 7.8  ALBUMIN 3.9   No results for input(s): "LIPASE", "AMYLASE" in the last 168 hours. No results for input(s): "AMMONIA" in the last 168 hours. Coagulation Profile: No results for input(s): "INR", "PROTIME" in the last 168 hours. Cardiac Enzymes: No results for input(s): "CKTOTAL", "CKMB", "CKMBINDEX", "TROPONINI" in the last 168 hours. BNP (last 3 results) No results for input(s): "PROBNP" in the last 8760 hours. HbA1C: Recent Labs    04/12/23 1109  HGBA1C 10.8*   CBG: Recent Labs  Lab 04/13/23 0708 04/13/23 1116 04/13/23 1628 04/13/23 2138 04/14/23 0730  GLUCAP 307* 258* 290* 215* 218*   Lipid Profile: No results for input(s): "CHOL", "HDL", "LDLCALC", "TRIG", "CHOLHDL", "LDLDIRECT" in the last 72 hours. Thyroid Function Tests: No results for input(s): "TSH", "T4TOTAL", "FREET4", "T3FREE", "THYROIDAB" in the last 72 hours. Anemia Panel: No results for input(s): "VITAMINB12", "FOLATE",  "FERRITIN", "TIBC", "IRON", "RETICCTPCT" in the last 72 hours. Sepsis Labs: No results for input(s): "PROCALCITON", "LATICACIDVEN" in the last 168 hours.  No results found for this or any previous visit (from the past 240 hour(s)).       Radiology Studies: VAS [place] ABI WITH/WO TBI  Result Date: 04/13/2023  LOWER EXTREMITY DOPPLER STUDY Patient Name:  ARSON LUNDWALL  Date of Exam:   04/13/2023 Medical Rec #: [id number]         Accession #:    [id number] Date of Birth: 21-Jul-1960          Patient Gender: M Patient Age:   74 years Exam [facility]  [facility] Procedure:      VAS [place] ABI WITH/WO TBI Referring Phys: Madelyn Flavors --------------------------------------------------------------------------------  Indications: Ulceration. Ongoing claudication, now with worsening pain and  numbness to the left foot High Risk Factors: Hypertension, hyperlipidemia, Diabetes, current smoker. 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  Vascular Interventions: No history of vascular intervention, had CT angio                         AO+BIFEM 04/30/21. Comparison Study: Prior ABI done 02/12/21 Performing Technologist: Sherren Kerns RVS  Examination Guidelines: A complete evaluation includes at minimum, Doppler waveform signals and systolic blood pressure reading at the level of bilateral brachial, anterior tibial, and posterior tibial arteries, when vessel segments are accessible. Bilateral testing is considered an integral part of a complete examination. Photoelectric Plethysmograph (PPG) waveforms and toe systolic pressure readings are included as required and additional duplex testing as needed. Limited examinations for reoccurring indications may be performed as noted.  ABI Findings: +---------+------------------+-----+----------------+--------+ Right    Rt Pressure (mmHg)IndexWaveform         Comment  +---------+------------------+-----+----------------+--------+ Brachial 176                    triphasic                +---------+------------------+-----+----------------+--------+ PTA      144               0.80 brisk monophasic         +---------+------------------+-----+----------------+--------+ DP       130               0.73 brisk monophasic         +---------+------------------+-----+----------------+--------+ Great Toe136               0.76 Abnormal                 +---------+------------------+-----+----------------+--------+ +---------+------------------+-----+-------------------+-------+ Left     Lt Pressure (mmHg)IndexWaveform           Comment +---------+------------------+-----+-------------------+-------+ Brachial 179                    triphasic                  +---------+------------------+-----+-------------------+-------+ PTA      0                 0.00 absent                     +---------+------------------+-----+-------------------+-------+ DP       54                0.30 dampened monophasic        +---------+------------------+-----+-------------------+-------+ Great Toe53                0.30 Abnormal                   +---------+------------------+-----+-------------------+-------+ +-------+-----------+-----------+------------+------------+ ABI/TBIToday's ABIToday's TBIPrevious ABIPrevious TBI +-------+-----------+-----------+------------+------------+ Right  0.80       0.76       0.81        0.50         +-------+-----------+-----------+------------+------------+ Left   0.30       0.30       0.72        0.50         +-------+-----------+-----------+------------+------------+  *See table(s) above for measurements and observations.  Preliminary    MR FOOT LEFT WO CONTRAST  Result Date: 04/13/2023 CLINICAL DATA:  Left great toe pain and numbness. EXAM: MRI OF THE LEFT FOOT WITHOUT CONTRAST  TECHNIQUE: Multiplanar, multisequence MR imaging of the left forefoot was performed. No intravenous contrast was administered. COMPARISON:  Left foot x-rays from yesterday. FINDINGS: Bones/Joint/Cartilage Mild marrow edema in the first distal phalanx (series 9, image 6). Remaining marrow signal is normal. No fracture or dislocation. Mild degenerative changes of the first MTP joint. No significant joint effusion. Ligaments Collateral ligaments are intact. Muscles and Tendons Flexor and extensor tendons are intact. No tenosynovitis. No muscle edema or atrophy. Soft tissue Small ulceration at the dorsal tip of the great toe with exposed bone (series 9, image 6). No fluid collection or hematoma. No soft tissue mass. IMPRESSION: 1. Small ulceration at the dorsal tip of the great toe with exposed bone. Mild marrow edema in the first distal phalanx is worrisome for early osteomyelitis. Electronically Signed   By: Obie Dredge M.D.   On: 04/13/2023 10:24        Scheduled Meds:  amoxicillin-clavulanate  1 tablet Oral Q12H   aspirin EC  81 mg Oral Daily   clopidogrel  75 mg Oral Daily   doxycycline  100 mg Oral Q12H   enoxaparin (LOVENOX) injection  40 mg Subcutaneous Q24H   gabapentin  100 mg Oral TID   insulin aspart  0-15 Units Subcutaneous TID WC   insulin aspart  0-5 Units Subcutaneous QHS   insulin glargine-yfgn  15 Units Subcutaneous Daily   nicotine  14 mg Transdermal Daily   sodium chloride flush  3 mL Intravenous Q12H   thiamine  100 mg Oral Daily   Continuous Infusions:   LOS: 2 days   Time spent:  Azucena Fallen, DO [facility]  If 7PM-7AM, please contact night-coverage [email or web address]  04/14/2023, 7:48 AM

## 2023-04-14 NOTE — Progress Notes (Signed)
PODIATRY PROGRESS NOTE Patient Name: Zachary Lewis  DOB 1960/10/14 DOA 04/11/2023  Hospital Day: 4  Assessment:  62 y.o. male  PMHx T2DM, HTN, hyperlipidemia, liver cirrhosis, 1/2ppd smoker admitted for worsening pain and numbness to the left foot.  Concern for critical limb ischemia and question of Left hallux osteomyelitis.   AF, VSS  WBC: 5.8 ESR/CRP: 12/1.0  Wound/Bone Cultures: None at this time  Imaging: MRI L foot 04/12/23 1. Small ulceration at the dorsal tip of the great toe with exposed bone. Mild marrow edema in the first distal phalanx is worrisome for early osteomyelitis.  ABI PVR studies final read pending, appears as if severe LLE arterial dz, ABI 0.3, TBI 0.3  Plan:  - Discussed with patient vascular and MRI findings. -- Recommend Vascular consult for abnormal ABI/TBI, will possibly require LLE angiogram this admission - Discussed partial amputation of the Left hallux vs waiting for demarcation and possible healing of the left hallux post revasc. MRI findings inconclusive and normal inflammatory markers. Plan for wound care at this time - Order for daily betadine paint to Left hallux - Likely will have patient follow up with me in office post discharge and determine if partial hallux amputation necessary on an outpatient basis. No acute need for amputation at this time.  Will continue to follow        Corinna Gab, DPM [facility]    Subjective:  Pt seen bedside this am, discussed findings of vascular studies and need for vascular consultation, discussed concern for possible bone infection in left hallux and options including waiting for demarcation with wound care vs inpatient partial amp. He would prefer to give toe a chance to heal and hold on amp for now thoguh understands if worsens clinically will require at least partial amp.   Objective:   Vitals:   04/14/23 0552 04/14/23 0751  BP: 114/75 132/81  Pulse: 79 94  Resp: 18 15   Temp: 97.9 F (36.6 C) 98.3 F (36.8 C)  SpO2: 100% 99%       Latest Ref Rng & Units 04/14/2023    4:21 AM 04/13/2023    5:33 AM 04/12/2023   11:09 AM  CBC  WBC 4.0 - 10.5 K/uL 5.8  5.7  7.4   Hemoglobin 13.0 - 17.0 g/dL 60.4  54.0  98.1   Hematocrit 39.0 - 52.0 % 42.1  41.9  44.3   Platelets 150 - 400 K/uL 220  213  247        Latest Ref Rng & Units 04/14/2023    4:21 AM 04/13/2023    5:33 AM 04/12/2023   11:09 AM  BMP  Glucose 70 - 99 mg/dL 191  478  295   BUN 8 - 23 mg/dL 13  12  11    Creatinine 0.61 - 1.24 mg/dL 6.21  3.08  6.57   Sodium 135 - 145 mmol/L 130  133  132   Potassium 3.5 - 5.1 mmol/L 4.3  4.3  4.0   Chloride 98 - 111 mmol/L 97  100  95   CO2 22 - 32 mmol/L 22  25  23    Calcium 8.9 - 10.3 mg/dL 9.0  8.7  9.0     General: AAOx3, NAD  Lower Extremity Exam Vasc: R - P non  palpable, DP palpable. Cap refill < 3 sec to digits  L - PT non palpable, DP non palpable. Cap refill absent to L hallux  Derm: R -  Normal temp/texture/turgor with no open lesion or clinical signs of infection   L - Gangrenous changes to the distal hallux and 5th toe - no drainage malodor edema or erythema. Left hallux nail avulsed.   MSK:  R -  No gross deformities. Compartments soft, non-tender, compressible  L -  No gross deformities. Compartments soft, non-tender, compressible  Neuro: R - Gross sensation absent. Gross motor function intact   L - Gross sensation absent. Gross motor function intact   Radiology:  Results reviewed. See assessment for pertinent imaging results

## 2023-04-15 ENCOUNTER — Encounter (HOSPITAL_COMMUNITY)
Admission: EM | Disposition: A | Discharge status: Home / Self Care | Payer: Self-pay | Source: Home / Self Care | Attending: Internal Medicine

## 2023-04-15 ENCOUNTER — Inpatient Hospital Stay (HOSPITAL_COMMUNITY): Payer: No Typology Code available for payment source

## 2023-04-15 DIAGNOSIS — Z79899 Other long term (current) drug therapy: Secondary | ICD-10-CM

## 2023-04-15 DIAGNOSIS — E1151 Type 2 diabetes mellitus with diabetic peripheral angiopathy without gangrene: Secondary | ICD-10-CM

## 2023-04-15 DIAGNOSIS — L089 Local infection of the skin and subcutaneous tissue, unspecified: Secondary | ICD-10-CM | POA: Diagnosis not present

## 2023-04-15 DIAGNOSIS — Z7902 Long term (current) use of antithrombotics/antiplatelets: Secondary | ICD-10-CM

## 2023-04-15 DIAGNOSIS — I70222 Atherosclerosis of native arteries of extremities with rest pain, left leg: Secondary | ICD-10-CM

## 2023-04-15 DIAGNOSIS — M869 Osteomyelitis, unspecified: Secondary | ICD-10-CM | POA: Diagnosis not present

## 2023-04-15 DIAGNOSIS — I10 Essential (primary) hypertension: Secondary | ICD-10-CM | POA: Diagnosis not present

## 2023-04-15 DIAGNOSIS — F17210 Nicotine dependence, cigarettes, uncomplicated: Secondary | ICD-10-CM

## 2023-04-15 DIAGNOSIS — E11628 Type 2 diabetes mellitus with other skin complications: Secondary | ICD-10-CM | POA: Diagnosis not present

## 2023-04-15 DIAGNOSIS — Z7982 Long term (current) use of aspirin: Secondary | ICD-10-CM

## 2023-04-15 DIAGNOSIS — I7090 Unspecified atherosclerosis: Secondary | ICD-10-CM

## 2023-04-15 HISTORY — PX: ABDOMINAL AORTOGRAM W/LOWER EXTREMITY: CATH118223

## 2023-04-15 LAB — GLUCOSE, CAPILLARY
Glucose-Capillary: 204 mg/dL — ABNORMAL HIGH (ref 70–99)
Glucose-Capillary: 154 mg/dL — ABNORMAL HIGH (ref 70–99)
Glucose-Capillary: 308 mg/dL — ABNORMAL HIGH (ref 70–99)

## 2023-04-15 LAB — VAS US ABI WITH/WO TBI
Left ABI: 0.3
Right ABI: 0.8

## 2023-04-15 SURGERY — ABDOMINAL AORTOGRAM W/LOWER EXTREMITY
Anesthesia: LOCAL

## 2023-04-15 MED ORDER — MIDAZOLAM HCL 2 MG/2ML IJ SOLN
INTRAMUSCULAR | Status: AC
  Filled 2023-04-15: qty 2

## 2023-04-15 MED ORDER — GABAPENTIN 100 MG PO CAPS: 100.0000 mg | ORAL_CAPSULE | Freq: Three times a day (TID) | ORAL | 0 refills | Status: DC | PRN

## 2023-04-15 MED ORDER — ACETAMINOPHEN 325 MG PO TABS: 650.0000 mg | ORAL_TABLET | ORAL | Status: DC | PRN

## 2023-04-15 MED ORDER — EZETIMIBE 10 MG PO TABS: 10.0000 mg | ORAL_TABLET | Freq: Every day | ORAL | Status: DC

## 2023-04-15 MED ORDER — LABETALOL HCL 5 MG/ML IV SOLN: 10.0000 mg | INTRAVENOUS | Status: DC | PRN

## 2023-04-15 MED ORDER — EZETIMIBE 10 MG PO TABS: 10.0000 mg | ORAL_TABLET | Freq: Every day | ORAL | 0 refills | Status: DC

## 2023-04-15 MED ORDER — SODIUM CHLORIDE 0.9 % IV SOLN: INTRAVENOUS | Status: AC

## 2023-04-15 MED ORDER — MIDAZOLAM HCL 2 MG/2ML IJ SOLN
INTRAMUSCULAR | Status: DC | PRN
  Administered 2023-04-15: 1 mg via INTRAVENOUS

## 2023-04-15 MED ORDER — SODIUM CHLORIDE 0.9% FLUSH: 3.0000 mL | Freq: Two times a day (BID) | INTRAVENOUS | Status: DC

## 2023-04-15 MED ORDER — DOXYCYCLINE HYCLATE 100 MG PO TABS: 100.0000 mg | ORAL_TABLET | Freq: Two times a day (BID) | ORAL | 0 refills | Status: AC

## 2023-04-15 MED ORDER — HYDRALAZINE HCL 20 MG/ML IJ SOLN: 5.0000 mg | INTRAMUSCULAR | Status: DC | PRN

## 2023-04-15 MED ORDER — LIDOCAINE HCL (PF) 1 % IJ SOLN
INTRAMUSCULAR | Status: DC | PRN
  Administered 2023-04-15: 15 mL via INTRADERMAL

## 2023-04-15 MED ORDER — SODIUM CHLORIDE 0.9 % IV SOLN: 250.0000 mL | INTRAVENOUS | Status: DC | PRN

## 2023-04-15 MED ORDER — LIDOCAINE HCL (PF) 1 % IJ SOLN
INTRAMUSCULAR | Status: AC
  Filled 2023-04-15: qty 30

## 2023-04-15 MED ORDER — FENTANYL CITRATE (PF) 100 MCG/2ML IJ SOLN
INTRAMUSCULAR | Status: AC
  Filled 2023-04-15: qty 2

## 2023-04-15 MED ORDER — FENTANYL CITRATE (PF) 100 MCG/2ML IJ SOLN
INTRAMUSCULAR | Status: DC | PRN
  Administered 2023-04-15: 50 ug via INTRAVENOUS

## 2023-04-15 MED ORDER — SODIUM CHLORIDE 0.9% FLUSH: 3.0000 mL | INTRAVENOUS | Status: DC | PRN

## 2023-04-15 MED ORDER — IODIXANOL 320 MG/ML IV SOLN
INTRAVENOUS | Status: DC | PRN
  Administered 2023-04-15: 45 mL via INTRA_ARTERIAL

## 2023-04-15 MED ORDER — SODIUM CHLORIDE 0.9 % WEIGHT BASED INFUSION
1.0000 mL/kg/h | INTRAVENOUS | Status: DC
  Administered 2023-04-15: 1 mL/kg/h via INTRAVENOUS

## 2023-04-15 MED ORDER — HEPARIN (PORCINE) IN NACL 1000-0.9 UT/500ML-% IV SOLN
INTRAVENOUS | Status: DC | PRN
  Administered 2023-04-15: 1000 mL

## 2023-04-15 MED ORDER — AMOXICILLIN-POT CLAVULANATE 875-125 MG PO TABS: 1.0000 | ORAL_TABLET | Freq: Two times a day (BID) | ORAL | 0 refills | Status: AC

## 2023-04-15 SURGICAL SUPPLY — 12 items
CATH OMNI FLUSH 5F 65CM (CATHETERS) IMPLANT
CLOSURE MYNX CONTROL 5F (Vascular Products) IMPLANT
GLIDEWIRE ADV .035X260CM (WIRE) IMPLANT
KIT MICROPUNCTURE NIT STIFF (SHEATH) IMPLANT
KIT PV (KITS) ×1 IMPLANT
KIT SYRINGE INJ CVI SPIKEX1 (MISCELLANEOUS) IMPLANT
SET ATX-X65L (MISCELLANEOUS) IMPLANT
SHEATH PINNACLE 5F 10CM (SHEATH) IMPLANT
SHEATH PROBE COVER 6X72 (BAG) IMPLANT
TRANSDUCER W/STOPCOCK (MISCELLANEOUS) ×1 IMPLANT
TRAY PV CATH (CUSTOM PROCEDURE TRAY) ×1 IMPLANT
WIRE BENTSON .035X145CM (WIRE) IMPLANT

## 2023-04-15 NOTE — Consult Note (Addendum)
Hospital Consult    Reason for Consult: CLI with Left foot ulceration Requesting Physician:  Dr. Natale Milch MRN #:  [id number]  History of Present Illness: This is a 62 y.o. male with pertinent medical history including type II DM, HTN, HLD, Cirrhosis, tobacco use and PAD who presented with worsening pain and numbness in left foot. He explains that last year in spring time he began having pain on ambulation in his calf. He said he could do okay on the treadmill at his house but when walking in park etc with his wife he would have to stop and rest after about 1/4 mile. He was seen by Cardiologist and started on medical management with Aspirin, Statin and Plavix. He says this helped with his symptoms. However about 2-3 weeks ago the numbness in his left foot began and has progressed to point of not being able to walk.In addition last week the left great toe nail fell off. He also has some other wounds on left 5th toe and plantar aspect of the left foot. He denies any fever or chills. He does smoke daily about 1/4 ppd.  On admission X-rays of the left foot noted cortical deficit in discontinuity along the plantar aspect of the first digit concerning for possible fracture versus osteomyelitis. Patient was started on empiric antibiotics of vancomycin and Zosyn. ABI was performed showing mild arterial disease on the RLE with ABI of 0.8 and significant arterial disease on the left with ABI of 0.3.   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) 08/04/2018   Diabetes mellitus    Type II   Hyperlipidemia    Hypertension    NSAID-induced gastric ulcer 12/13/2018   EGD 11/2018 H pylori neg Omeprazole started   Obesity    Personal history of colonic adenomas 03/09/2013   Tinea pedis    Chronic    Past Surgical History:  Procedure Laterality Date   COLONOSCOPY     COLONOSCOPY W/ POLYPECTOMY  2014   IR  PARACENTESIS  07/02/2018   IR TRANSCATHETER BX  07/17/2018   IR VENOGRAM HEPATIC WO HEMODYNAMIC EVALUATION  07/17/2018   myringectomy  06/2005   POLYPECTOMY     UMBILICAL HERNIA REPAIR  01/1997   VASECTOMY  07/2002    No Known Allergies  Prior to Admission medications   Medication Sig Start Date End Date Taking? Authorizing Provider  aspirin EC 81 MG tablet Take 1 tablet (81 mg total) by mouth daily. Swallow whole. 03/27/21  Yes Iran Ouch, MD  clopidogrel (PLAVIX) 75 MG tablet Take 1 tablet (75 mg total) by mouth daily. 02/25/23  Yes Iran Ouch, MD  clotrimazole-betamethasone (LOTRISONE) cream Apply 1 Application topically daily. 01/24/23  Yes Felecia Shelling, DPM  Evolocumab (REPATHA SURECLICK) 140 MG/ML SOAJ Inject 140 mg into the skin every 14 (fourteen) days. Patient taking differently: Inject 1 mL into the skin every 14 (fourteen) days. Every other Tuesday 09/23/22  Yes Iran Ouch, MD  insulin glargine (LANTUS SOLOSTAR) 100 UNIT/ML Solostar Pen Inject 10 Units into the skin daily. Plus sliding scale, max dose 50 units a day 10/10/22  Yes Motwani, Komal, MD  Semaglutide,0.25 or 0.5MG /DOS, 2 MG/3ML SOPN Inject 0.25 mg into the skin once a week. Patient taking differently: Inject 0.25 mg into the skin every Monday. 10/10/22  Yes Motwani, Komal, MD  spironolactone (ALDACTONE) 50 MG tablet Take 1 tablet (50  mg total) by mouth daily. 02/08/22  Yes Tower, Audrie Gallus, MD  tadalafil (CIALIS) 20 MG tablet Take 1 tablet (20 mg total) by mouth daily as needed. 12/02/22  Yes   thiamine 100 MG tablet Take 1 tablet (100 mg total) by mouth daily. 06/02/18  Yes Leroy Sea, MD  Continuous Glucose Sensor (DEXCOM G7 SENSOR) MISC 1 Device by Does not apply route continuous. 10/10/22   Motwani, Carin Hock, MD  glucose blood (ONETOUCH VERIO) test strip 1 each by Other route daily. And lancets 1/day Patient not taking: Reported on 02/25/2023 01/26/21   Romero Belling, MD  Insulin Pen Needle 31G X 8 MM MISC  Inject 1 Needle into the skin daily at 6 (six) AM. 12/27/22   Motwani, Komal, MD  latanoprost (XALATAN) 0.005 % ophthalmic solution Place 1 drop into both eyes at bedtime. Patient not taking: Reported on 04/12/2023 03/01/21   [provider]    Social History   Socioeconomic History   Marital status: Married    Spouse name: Not on file   Number of children: 5   Years of education: Not on file   Highest education level: Not on file  Occupational History   Occupation: [employer]    Employer: [employer]   Occupation: [employer]  Tobacco Use   Smoking status: Light Smoker    Current packs/day: 0.25    Types: Cigarettes   Smokeless tobacco: Never  Vaping Use   Vaping status: Never Used  Substance and Sexual Activity   Alcohol use: Yes    Comment: 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 [employer]   Smoker, heavy drinking of [place] mist alcohol in past   No drug use   Social Determinants of Health   Financial Resource Strain: Not on file  Food Insecurity: No Food Insecurity (04/12/2023)   Hunger Vital Sign    Worried About Running Out of Food in the Last Year: Never true    Ran Out of Food in the Last Year: Never true  Transportation Needs: No Transportation Needs (04/12/2023)   PRAPARE - Administrator, Civil Service (Medical): No    Lack of Transportation (Non-Medical): No  Physical Activity: Not on file  Stress: Not on file  Social Connections: Not on file  Intimate Partner Violence: Not At Risk (04/12/2023)   Humiliation, Afraid, Rape, and Kick questionnaire    Fear of Current or Ex-Partner: No    Emotionally Abused: No    Physically Abused: No    Sexually Abused: No     Family History  Problem Relation Age of Onset   Hypertension  Other    Diabetes Neg Hx    Heart disease Neg Hx    Colon cancer Neg Hx    Stomach cancer Neg Hx    Pancreatic disease Neg Hx    Colon polyps Neg Hx    Esophageal cancer Neg Hx    Rectal cancer Neg Hx     ROS: Otherwise negative unless mentioned in HPI  Physical Examination  Vitals:   04/15/23 0457 04/15/23 0711  BP: 119/86 123/86  Pulse: 83 84  Resp: 18 18  Temp: 98.5 F (36.9 C) 98.1 F (36.7 C)  SpO2: 99% 99%  Body mass index is 33.02 kg/m.  General:  WDWN in NAD Gait: Not observed HENT: WNL, normocephalic Pulmonary: normal non-labored breathing, without wheezing Cardiac: regula Abdomen: soft Vascular Exam/Pulses: 2+ femoral pulses bilaterally, 2+ right DP, no palpable distal pulses in left foot. Foot warm. Motor and sensation intact Left great toe with self avulsed nail and subungual ulceration. There is dry eschar present at tip of 1st toe and 5th toe. Some lateral plantar ulcers as well     Musculoskeletal: no muscle wasting or atrophy  Neurologic: A&O X 3;  No focal weakness or paresthesias are detected; speech is fluent/normal Psychiatric:  The pt has Normal affect.   CBC    Component Value Date/Time   WBC 5.8 04/14/2023 0421   RBC 4.29 04/14/2023 0421   HGB 14.3 04/14/2023 0421   HCT 42.1 04/14/2023 0421   PLT 220 04/14/2023 0421   MCV 98.1 04/14/2023 0421   MCH 33.3 04/14/2023 0421   MCHC 34.0 04/14/2023 0421   RDW 12.8 04/14/2023 0421   LYMPHSABS 1.5 04/14/2023 0421   MONOABS 0.7 04/14/2023 0421   EOSABS 0.5 04/14/2023 0421   BASOSABS 0.1 04/14/2023 0421    BMET    Component Value Date/Time   NA 130 (L) 04/14/2023 0421   K 4.3 04/14/2023 0421   CL 97 (L) 04/14/2023 0421   CO2 22 04/14/2023 0421   GLUCOSE 200 (H) 04/14/2023 0421   BUN 13 04/14/2023 0421   CREATININE 0.73 04/14/2023 0421   CALCIUM 9.0 04/14/2023 0421   GFRNONAA >60 04/14/2023 0421   GFRAA >60 06/01/2018 0427    COAGS: Lab Results  Component Value Date   INR  1.1 (H) 11/25/2022   INR 1.0 07/17/2018   INR 1.3 (H) 07/02/2018     Non-Invasive Vascular Imaging:   +-------+-----------+-----------+------------+------------+  ABI/TBIToday's ABIToday's TBIPrevious ABIPrevious TBI  +-------+-----------+-----------+------------+------------+  Right 0.80       0.76       0.81        0.50          +-------+-----------+-----------+------------+------------+  Left  0.30       0.30       0.72        0.50          +-------+-----------+-----------+------------+------------+   Prior ABI performed on 02/12/21  Statin:  Yes.   Beta Blocker:  No. Aspirin:  Yes.   ACEI:  No. ARB:  No. CCB use:  No Other antiplatelets/anticoagulants:  Yes.   Plavix   ASSESSMENT/PLAN: This is a 62 y.o. 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 He has history of >1 year of claudication symptoms that have been managed medically. Recently developed progressive numbness in left foot with tissue loss. ABI's on left show significant arterial disease and these have decreased significantly compared to his prior study in 2022. Discussed with patient and his wife nature of arterial disease and have recommended Aortogram with possible intervention. I discussed risks/ benefits of procedure including bleeding, arterial injury or dissection, thrombosis, or need for surgical intervention and they are agreeable to proceed. Patient is NPO already.  - Discussed the importance of walking/ exercise regimen - Encourage smoking cessation - Continue Aspirin, Statin and Plavix - Plan is for Aortogram, Arteriogram with Dr. Hetty Blend in the cath lab today. Pending intervention and recovery he potentially could be stable from our standpoint for discharge later this afternoon   Graceann Congress PA-C [facility] [phone] 04/15/2023  8:08 AM  VASCULAR STAFF  ADDENDUM: I have independently interviewed and examined the patient. I agree with the above.  62 year old  male with chronic limb-threatening ischemia with tissue loss and osteomyelitis of the left first toe.  ABIs demonstrated significant arterial insufficiency.  We discussed the risks and benefits of angiogram, he expressed understanding was willing to proceed. Plan for angiogram today  Daria Pastures MD [facility] Phone Number: [phone] 04/15/2023 11:19 AM

## 2023-04-15 NOTE — Progress Notes (Signed)
Patient is ready to go cath lab, wife accompanied him with all his belongings.

## 2023-04-15 NOTE — Progress Notes (Signed)
Pt admitted to rm 1 from cath lab. Initiated tele. VSS. Call bell within reach.   Lawson Radar, RN

## 2023-04-15 NOTE — Progress Notes (Signed)
Discharge instructions reviewed with pt and his wife.  Copy of instructions given to pt. Pt informed his scripts were sent to his pharmacy for pick up. Hand out on post cath care reviewed and given to pt.  Pt to be d/c'd via wheelchair with belongings, with his wife.            To be escorted by staff.   Annice Needy, RN SWOT

## 2023-04-15 NOTE — Plan of Care (Signed)
Care plan

## 2023-04-15 NOTE — Discharge Summary (Signed)
Physician Discharge Summary  Zachary Lewis ZOX:[id number] DOB: 13-Dec-1960 DOA: 04/11/2023  PCP: Judy Pimple, MD  Admit date: 04/11/2023 Discharge date: 04/15/2023  Admitted From: Home Disposition:  Home  Recommendations for Outpatient Follow-up:  Follow up with PCP in 1-2 weeks Follow up with vascular surgery for scheduled appointment 04/18/23  Home Health:None  Equipment/Devices:None  Discharge Condition: Stable CODE STATUS:Full  Diet recommendation: As tolerated    Brief/Interim Summary: 62 yr old man with known history of hypertension, hyperlipidemia, diabetes mellitus II, peripheral vascular disease, NSAID induced gastric ulcer, 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  Patient admitted as above with acute worsening foot pain, followed outpatient by podiatry with recent toenail removal, initially placed on IV antibiotics with podiatry consult.  Initial concern for osteomyelitis versus dry gangrene was outweighed by unremarkable imaging, podiatry ordered ABI which did result with abnormal findings, vascular surgery consulted recommending vein mapping and imaging today with planned vascular procedure on 04/18/23.  Given patient is currently on p.o. antibiotics pain is currently well-controlled and he is having no difficulty ambulating plan was to discharge patient home with close outpatient follow-up and return to facility on the 29th for vascular surgery.  Patient educated that any worsening pain bleeding or concern for infection and she report back to the hospital immediately.  Patient's other chronic comorbid conditions including diabetes, hypertension, cirrhosis, tobacco abuse are stable and otherwise no medication changes unless commented on as below.  Discharge Diagnoses:  Principal Problem:   Diabetic foot  infection (HCC) Active Problems:   Onychomycosis   Uncontrolled type 2 diabetes mellitus with hyperglycemia, with long-term current use of insulin (HCC)   PVD (peripheral vascular disease) (HCC)   Hypokalemia   HYPERTENSION, BENIGN ESSENTIAL   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)   Hyperlipidemia   Tobacco abuse   Obesity (BMI 30-39.9)    Discharge Instructions  Discharge Instructions     Discharge patient   Complete by: As directed    Discharge disposition: 01-Home or Self Care   Discharge patient date: 04/15/2023      Allergies as of 04/15/2023   No Known Allergies      Medication List     STOP taking these medications    latanoprost 0.005 % ophthalmic solution Commonly known as: XALATAN       TAKE these medications    amoxicillin-clavulanate 875-125 MG tablet Commonly known as: AUGMENTIN Take 1 tablet by mouth every 12 (twelve) hours for 10 days.   aspirin EC 81 MG tablet Take 1 tablet (81 mg total) by mouth daily. Swallow whole.   clopidogrel 75 MG tablet Commonly known as: PLAVIX Take 1 tablet (75 mg total) by mouth daily.   clotrimazole-betamethasone cream Commonly known as: LOTRISONE Apply 1 Application topically daily.   Dexcom G7 Sensor Misc 1 Device by Does not apply route continuous.   doxycycline 100 MG tablet Commonly known as: VIBRA-TABS Take 1 tablet (100 mg total) by mouth every 12 (twelve) hours for 10 days.   ezetimibe 10 MG tablet Commonly known as: ZETIA Take 1 tablet (10 mg total) by mouth daily. Start taking on: April 16, 2023   gabapentin 100 MG capsule Commonly known as: NEURONTIN Take 1 capsule (100 mg total) by mouth 3 (three) times daily as needed (nerve pain).  Insulin Pen Needle 31G X 8 MM Misc Inject 1 Needle into the skin daily at 6 (six) AM.   Lantus SoloStar 100 UNIT/ML Solostar Pen Generic drug: insulin glargine Inject 10 Units into the skin daily. Plus sliding scale, max dose 50  units a day   OneTouch Verio test strip Generic drug: glucose blood 1 each by Other route daily. And lancets 1/day   Repatha SureClick 140 MG/ML Soaj Generic drug: Evolocumab Inject 140 mg into the skin every 14 (fourteen) days. What changed:  how much to take additional instructions   Semaglutide(0.25 or 0.5MG /DOS) 2 MG/3ML Sopn Inject 0.25 mg into the skin once a week. What changed: when to take this   spironolactone 50 MG tablet Commonly known as: ALDACTONE Take 1 tablet (50 mg total) by mouth daily.   tadalafil 20 MG tablet Commonly known as: CIALIS Take 1 tablet (20 mg total) by mouth daily as needed.   thiamine 100 MG tablet Commonly known as: VITAMIN B1 Take 1 tablet (100 mg total) by mouth daily.        No Known Allergies  Consultations: Podiatry, Vascular Sx  Procedures/Studies: VAS [place] LOWER EXTREMITY SAPHENOUS VEIN MAPPING  Result Date: 04/15/2023 LOWER EXTREMITY VEIN MAPPING Patient Name:  Zachary Lewis  Date of Exam:   04/15/2023 Medical Rec #: [id number]         Accession #:    [id number] Date of Birth: Mar 17, 1961          Patient Gender: M Patient Age:   62 years Exam Location:  [facility] Procedure:      VAS [place] LOWER EXTREMITY SAPHENOUS VEIN MAPPING Referring Phys: Carolynn Sayers --------------------------------------------------------------------------------  Indications:  PAD Risk Factors: Hypertension, hyperlipidemia, PAD.  Comparison Study: No prior studies. Performing Technologist: Chanda Busing RVT  Examination Guidelines: A complete evaluation includes B-mode imaging, spectral Doppler, color Doppler, and power Doppler as needed of all accessible portions of each vessel. Bilateral testing is considered an integral part of a complete examination. Limited examinations for reoccurring indications may be performed as noted. +---------------+-----------+----------------------+---------------+-----------+   RT Diameter  RT Findings         GSV             LT Diameter  LT Findings      (cm)                                            (cm)                  +---------------+-----------+----------------------+---------------+-----------+      0.26                     Saphenofemoral         0.53                                                   Junction                                  +---------------+-----------+----------------------+---------------+-----------+      0.17  Proximal thigh         0.53       branching  +---------------+-----------+----------------------+---------------+-----------+      0.19       branching       Mid thigh            0.37       branching  +---------------+-----------+----------------------+---------------+-----------+      0.12       branching      Distal thigh          0.25                  +---------------+-----------+----------------------+---------------+-----------+      0.06                          Knee              0.28       branching  +---------------+-----------+----------------------+---------------+-----------+      0.18       branching       Prox calf            0.19                  +---------------+-----------+----------------------+---------------+-----------+      0.17                        Mid calf            0.24       branching  +---------------+-----------+----------------------+---------------+-----------+      0.22                      Distal calf           0.20                  +---------------+-----------+----------------------+---------------+-----------+ Diagnosing physician: Lemar Livings MD Electronically signed by Lemar Livings MD on 04/15/2023 at 2:30:49 PM.    Final    VAS [place] ABI WITH/WO TBI  Result Date: 04/15/2023  LOWER EXTREMITY DOPPLER STUDY Patient Name:  Zachary Lewis  Date of Exam:   04/13/2023 Medical Rec #: [id number]         Accession #:    [id number] Date of Birth: 03/18/1961          Patient Gender: M  Patient Age:   62 years Exam Location:  [facility] Procedure:      VAS [place] ABI WITH/WO TBI Referring Phys: Madelyn Flavors --------------------------------------------------------------------------------  Indications: Ulceration. Ongoing claudication, now with worsening pain and              numbness to the left foot High Risk Factors: Hypertension, hyperlipidemia, Diabetes, current smoker. 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  Vascular Interventions: No history of vascular intervention, had CT angio                         AO+BIFEM 04/30/21. Comparison Study: Prior ABI done 02/12/21 Performing Technologist: Sherren Kerns RVS  Examination Guidelines: A complete evaluation includes at minimum, Doppler waveform signals and systolic blood pressure reading at the level of bilateral brachial, anterior tibial, and posterior tibial arteries, when vessel segments are accessible. Bilateral testing is considered an integral  part of a complete examination. Photoelectric Plethysmograph (PPG) waveforms and toe systolic pressure readings are included as required and additional duplex testing as needed. Limited examinations for reoccurring indications may be performed as noted.  ABI Findings: +---------+------------------+-----+----------------+--------+ Right    Rt Pressure (mmHg)IndexWaveform        Comment  +---------+------------------+-----+----------------+--------+ Brachial 176                    triphasic                +---------+------------------+-----+----------------+--------+ PTA      144               0.80 brisk monophasic         +---------+------------------+-----+----------------+--------+ DP       130               0.73 brisk monophasic         +---------+------------------+-----+----------------+--------+ Great Toe136               0.76 Abnormal                  +---------+------------------+-----+----------------+--------+ +---------+------------------+-----+-------------------+-------+ Left     Lt Pressure (mmHg)IndexWaveform           Comment +---------+------------------+-----+-------------------+-------+ Brachial 179                    triphasic                  +---------+------------------+-----+-------------------+-------+ PTA      0                 0.00 absent                     +---------+------------------+-----+-------------------+-------+ DP       54                0.30 dampened monophasic        +---------+------------------+-----+-------------------+-------+ Great Toe53                0.30 Abnormal                   +---------+------------------+-----+-------------------+-------+ +-------+-----------+-----------+------------+------------+ ABI/TBIToday's ABIToday's TBIPrevious ABIPrevious TBI +-------+-----------+-----------+------------+------------+ Right  0.80       0.76       0.81        0.50         +-------+-----------+-----------+------------+------------+ Left   0.30       0.30       0.72        0.50         +-------+-----------+-----------+------------+------------+  *See table(s) above for measurements and observations.  Electronically signed by Lemar Livings MD on 04/15/2023 at 2:30:12 PM.    Final    PERIPHERAL VASCULAR CATHETERIZATION  Result Date: 04/15/2023 Images from the original result were not included. Patient name: Zachary Lewis MRN: [id number] DOB: 19-Sep-1960 Sex: male 04/15/2023 Pre-operative Diagnosis: CL TI with left great toe wound and osteomyelitis Post-operative diagnosis:  Same Surgeon:  Daria Pastures, MD Procedure Performed: Ultrasound-guided access of right common femoral artery Aortogram and left lower extremity angiogram Second-order cannulation of left external iliac artery Closure of right common femoral artery with Mynx closure device 21 minutes of moderate sedation  Indications: Mr. Ruszkowski is a 62 year old male with medical history of type 2 diabetes, hypertension, HLD, cirrhosis and PAD who presented to the hospital with worsening left foot numbness.  He was also noted to have  a left great toe wound as well as a wound on the left fifth toe.  ABI demonstrates severe arterial insufficiency with a toe pressure of 58 and an ABI 0.3 on the left.  Risks and benefits of angiogram with possible invention were reviewed, he expressed understanding and was willing to proceed. Findings: Widely patent infrarenal aorta and bilateral renal arteries.  Widely patent bilateral iliac systems.  Widely patent left common femoral and profunda arteries.  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  The popliteal artery is occluded the peroneal artery is reconstituted distally which fills the DP on the foot.  The PT is chronically occluded and does not appear to reconstitute.  Procedure:  The patient was identified in the holding area and taken to the cath lab  The patient was then placed supine on the table and prepped and draped in the usual sterile fashion.  A time out was called.  Ultrasound was used to evaluate the right common femoral artery.  It was patent .  A digital ultrasound image was acquired.  A micropuncture needle was used to access the right common femoral artery under ultrasound guidance.  An 018 wire was advanced without resistance and a micropuncture sheath was placed.  The 018 wire was removed and a benson wire was placed.  The micropuncture sheath was exchanged for a 5 french sheath.  An omniflush catheter was advanced over the wire to the level of L-1.  An abdominal angiogram was obtained.  Next, using the omniflush catheter and a glide advantage wire, the aortic bifurcation was crossed and the catheter was placed into theleft external iliac artery and left runoff was obtained. This demonstrated the above findings.  Myself and my partner Dr. Karin Lieu  reviewed the imaging and clinical this was not amenable to endovascular revascularization.  The Omni Flush catheter was removed and the artery was closed with a Mynx closure device with excellent hemostasis. Contrast: 45 cc Sedation: 21 minutes Impression: Severe multilevel disease with occlusion of the distal SFA and only reconstitution of the left peroneal and distal AT at the ankle.  Plan for bypass, will order vein mapping. Daria Pastures MD [facility] Office: [phone]   MR FOOT LEFT WO CONTRAST  Result Date: 04/13/2023 CLINICAL DATA:  Left great toe pain and numbness. EXAM: MRI OF THE LEFT FOOT WITHOUT CONTRAST TECHNIQUE: Multiplanar, multisequence MR imaging of the left forefoot was performed. No intravenous contrast was administered. COMPARISON:  Left foot x-rays from yesterday. FINDINGS: Bones/Joint/Cartilage Mild marrow edema in the first distal phalanx (series 9, image 6). Remaining marrow signal is normal. No fracture or dislocation. Mild degenerative changes of the first MTP joint. No significant joint effusion. Ligaments Collateral ligaments are intact. Muscles and Tendons Flexor and extensor tendons are intact. No tenosynovitis. No muscle edema or atrophy. Soft tissue Small ulceration at the dorsal tip of the great toe with exposed bone (series 9, image 6). No fluid collection or hematoma. No soft tissue mass. IMPRESSION: 1. Small ulceration at the dorsal tip of the great toe with exposed bone. Mild marrow edema in the first distal phalanx is worrisome for early osteomyelitis. Electronically Signed   By: Obie Dredge M.D.   On: 04/13/2023 10:24   DG Foot Complete Left  Result Date: 04/11/2023 CLINICAL DATA:  Left great toe pain. EXAM: LEFT FOOT - COMPLETE 3+ VIEW COMPARISON:  None Available. FINDINGS: Cortical defect in discontinuity are present along the plantar aspect  of the first digit. No dislocation is seen degenerative changes are present at the  first metatarsophalangeal joint, midfoot, and hindfoot. Vascular calcifications are present in the soft tissues. Soft tissue swelling is noted at the first digit. IMPRESSION: Cortical defect and discontinuity along the plantar aspect of the first digit, possible fracture or osteomyelitis in the appropriate clinical setting. Electronically Signed   By: Thornell Sartorius M.D.   On: 04/11/2023 21:19     Subjective: No acute issues/events overnight   Discharge Exam: Vitals:   04/15/23 1400 04/15/23 1443  BP: 121/83 123/78  Pulse: 86 92  Resp: 20 13  Temp:  98 F (36.7 C)  SpO2: 97% 100%   Vitals:   04/15/23 1300 04/15/23 1330 04/15/23 1400 04/15/23 1443  BP: (!) 122/99 111/84 121/83 123/78  Pulse: 82 88 86 92  Resp: 15 (!) 23 20 13   Temp:    98 F (36.7 C)  TempSrc:    Oral  SpO2: 96% 97% 97% 100%  Weight:        General: Pt is alert, awake, not in acute distress Cardiovascular: RRR, S1/S2 +, no rubs, no gallops Respiratory: CTA bilaterally, no wheezing, no rhonchi Abdominal: Soft, NT, ND, bowel sounds + Extremities: no edema, no cyanosis, Left foot bandage clean/dry/intact    The results of significant diagnostics from this hospitalization (including imaging, microbiology, ancillary and laboratory) are listed below for reference.     Microbiology: No results found for this or any previous visit (from the past 240 hour(s)).   Labs: BNP (last 3 results) No results for input(s): "BNP" in the last 8760 hours. Basic Metabolic Panel: Recent Labs  Lab 04/11/23 2014 04/12/23 0350 04/12/23 1109 04/13/23 0533 04/14/23 0421  NA 137 135 132* 133* 130*  K 3.4* 4.0 4.0 4.3 4.3  CL 100  --  95* 100 97*  CO2 18*  --  23 25 22   GLUCOSE 298*  --  312* 258* 200*  BUN 12  --  11 12 13   CREATININE 0.78  --  0.86 1.11 0.73  CALCIUM 9.5  --  9.0 8.7* 9.0   Liver Function Tests: Recent Labs  Lab 04/12/23 0124  AST 30  ALT 32  ALKPHOS 118  BILITOT 0.6  PROT 7.8  ALBUMIN 3.9    CBC: Recent Labs  Lab 04/11/23 2014 04/12/23 0350 04/12/23 1109 04/13/23 0533 04/14/23 0421  WBC 6.8  --  7.4 5.7 5.8  NEUTROABS  --   --  4.6  --  3.0  HGB 16.4 16.0 15.5 14.2 14.3  HCT 48.2 47.0 44.3 41.9 42.1  MCV 99.8  --  96.5 98.8 98.1  PLT 258  --  247 213 220   CBG: Recent Labs  Lab 04/14/23 1640 04/14/23 2108 04/15/23 0712 04/15/23 1228 04/15/23 1539  GLUCAP 124* 194* 204* 154* 308*   Sepsis Labs Recent Labs  Lab 04/11/23 2014 04/12/23 1109 04/13/23 0533 04/14/23 0421  WBC 6.8 7.4 5.7 5.8   Time coordinating discharge: Over 30 minutes  SIGNED:  Azucena Fallen, DO [facility] 04/15/2023, 4:49 PM Pager   If 7PM-7AM, please contact night-coverage [email or web address]

## 2023-04-15 NOTE — Progress Notes (Signed)
PHARMACIST LIPID MONITORING   Zachary Lewis is a 62 y.o. male admitted on with PAD.  Pharmacy has been consulted to optimize lipid-lowering therapy with the indication of secondary prevention for clinical ASCVD.  Recent Labs:  Lipid Panel (last 6 months):   No results found for: "CHOL", "TRIG", "HDL", "CHOLHDL", "VLDL", "LDLCALC", "LDLDIRECT"  Hepatic function panel (last 6 months):   Lab Results  Component Value Date   AST 30 04/12/2023   ALT 32 04/12/2023   ALKPHOS 118 04/12/2023   BILITOT 0.6 04/12/2023   BILIDIR 0.1 04/12/2023   IBILI 0.5 04/12/2023    SCr (since admission):   Serum creatinine: 0.73 mg/dL 40/98/11 9147 Estimated creatinine clearance: 100.8 mL/min  Current therapy and lipid therapy tolerance Current lipid-lowering therapy: Repatha Previous lipid-lowering therapies (if applicable): simvastatin Documented or reported allergies or intolerances to lipid-lowering therapies (if applicable): Intolerance to statin per chart history (liver disease and severe diarrhea)   Plan:    1.Statin intensity (high intensity recommended for all patients regardless of the LDL):  Statin intolerance noted. No statin changes due to serious side effects (ex. Myalgias with at least 2 different statins).  2.Add ezetimibe (if any one of the following):   -add Zetia  3.Refer to lipid clinic:   No  4.Follow-up with:  Primary care provider - Tower, Audrie Gallus, MD  5.Follow-up labs after discharge:  No changes in lipid therapy, repeat a lipid panel in one year.      Harland German, PharmD Clinical Pharmacist **Pharmacist phone directory can now be found on [email or web address] ([email or web address]).  Listed under [facility].

## 2023-04-15 NOTE — Op Note (Signed)
    Patient name: Zachary Lewis MRN: [id number] DOB: [birth year] Sex: male  04/15/2023 Pre-operative Diagnosis: CL TI with left great toe wound and osteomyelitis Post-operative diagnosis:  Same Surgeon:  Daria Pastures, MD Procedure Performed:  Ultrasound-guided access of right common femoral artery Aortogram and left lower extremity angiogram Second-order cannulation of left external iliac artery Closure of right common femoral artery with Mynx closure device 21 minutes of moderate sedation   Indications: [name] is a 62 year old male with medical history of type 2 diabetes, hypertension, HLD, cirrhosis and PAD who presented to the hospital with worsening left foot numbness.  He was also noted to have a left great toe wound as well as a wound on the left fifth toe.  ABI demonstrates severe arterial insufficiency with a toe pressure of 58 and an ABI 0.3 on the left.  Risks and benefits of angiogram with possible invention were reviewed, he expressed understanding and was willing to proceed.  Findings: Widely patent infrarenal aorta and bilateral renal arteries.  Widely patent bilateral iliac systems.  Widely patent left common femoral and profunda arteries.  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  The popliteal artery is occluded the peroneal artery is reconstituted distally which fills the DP on the foot.  The PT is chronically occluded and does not appear to reconstitute.   Procedure:  The patient was identified in the holding area and taken to the cath lab  The patient was then placed supine on the table and prepped and draped in the usual sterile fashion.  A time out was called.  Ultrasound was used to evaluate the right common femoral artery.  It was patent .  A digital ultrasound image was acquired.  A micropuncture needle was used to access the right common femoral artery under ultrasound guidance.  An 018 wire was advanced without resistance and a  micropuncture sheath was placed.  The 018 wire was removed and a benson wire was placed.  The micropuncture sheath was exchanged for a 5 french sheath.  An omniflush catheter was advanced over the wire to the level of L-1.  An abdominal angiogram was obtained.  Next, using the omniflush catheter and a glide advantage wire, the aortic bifurcation was crossed and the catheter was placed into theleft external iliac artery and left runoff was obtained. This demonstrated the above findings.  Myself and my partner Dr. Karin Lieu reviewed the imaging and clinical this was not amenable to endovascular revascularization.  The Omni Flush catheter was removed and the artery was closed with a Mynx closure device with excellent hemostasis.   Contrast: 45 cc Sedation: 21 minutes  Impression: Severe multilevel disease with occlusion of the distal SFA and only reconstitution of the left peroneal and distal AT at the ankle.  Plan for bypass, will order vein mapping.   Daria Pastures MD [facility] Office: [phone]

## 2023-04-15 NOTE — Progress Notes (Signed)
Bilateral lower extremity vein mapping has been completed. Preliminary results can be found in CV Proc through chart review.   04/15/23 2:06 PM Olen Cordial RVT

## 2023-04-16 ENCOUNTER — Encounter (HOSPITAL_COMMUNITY): Payer: Self-pay | Admitting: Vascular Surgery

## 2023-04-16 ENCOUNTER — Other Ambulatory Visit: Payer: Self-pay

## 2023-04-16 DIAGNOSIS — M869 Osteomyelitis, unspecified: Secondary | ICD-10-CM

## 2023-04-16 DIAGNOSIS — I70222 Atherosclerosis of native arteries of extremities with rest pain, left leg: Secondary | ICD-10-CM

## 2023-04-16 NOTE — Anesthesia Preprocedure Evaluation (Addendum)
Anesthesia Evaluation  Patient identified by MRN, date of birth, ID band Patient awake    Reviewed: Allergy & Precautions, H&P , NPO status , Patient's Chart, lab work & pertinent test results  Airway Mallampati: II  TM Distance: >3 FB Neck ROM: Full    Dental no notable dental hx.    Pulmonary neg pulmonary ROS, Current Smoker and Patient abstained from smoking.   Pulmonary exam normal breath sounds clear to auscultation       Cardiovascular hypertension, + Peripheral Vascular Disease  Normal cardiovascular exam Rhythm:Regular Rate:Normal     Neuro/Psych negative neurological ROS  negative psych ROS   GI/Hepatic negative GI ROS,,,(+) Cirrhosis       NASH   Endo/Other  diabetes, Poorly Controlled, Type 2    Renal/GU negative Renal ROS  negative genitourinary   Musculoskeletal negative musculoskeletal ROS (+)    Abdominal   Peds negative pediatric ROS (+)  Hematology negative hematology ROS (+)   Anesthesia Other Findings   Reproductive/Obstetrics negative OB ROS                             Anesthesia Physical Anesthesia Plan  ASA: 3  Anesthesia Plan: General   Post-op Pain Management:    Induction: Intravenous  PONV Risk Score and Plan: 1 and Ondansetron, Dexamethasone and Treatment may vary due to age or medical condition  Airway Management Planned: Oral ETT  Additional Equipment: Arterial line  Intra-op Plan:   Post-operative Plan: Extubation in OR  Informed Consent: I have reviewed the patients History and Physical, chart, labs and discussed the procedure including the risks, benefits and alternatives for the proposed anesthesia with the patient or authorized representative who has indicated his/her understanding and acceptance.     Dental advisory given  Plan Discussed with: CRNA and Surgeon  Anesthesia Plan Comments: (PAT note written 04/16/2023 by Shonna Chock, PA-C.  )       Anesthesia Quick Evaluation

## 2023-04-16 NOTE — Progress Notes (Signed)
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  [facility] admission 04/11/23 - 04/15/23 for left foot pain with discoloration left great toe. Known claudication followed by [facility] cardiologist Dr. Kirke Corin and had been managed on DAPT. Left foot xray and MRI concerning for possible osteomyelitis. He was started on IV antibiotics. ABI 0.3 on the left and 0.8 on the right. Podiatry and vascular surgeon consulted. He underwent angiogram on 04/15/23 that showed mild multifocal disease in the left SFA with distal chronic occlusion, popliteal artery and PT occlusion, peroneal artery reconstituted distally. He was discharged home on oral antibiotics with plans for left femoral to peroneal artery bypass on 04/18/2023.    Cirrhosis is followed by Dr. Leone Payor with last EGD and colonoscopy in July 2024. Last PLT count and LFTs were normal this month.   A1c 10.8%. On Lantus, semaglutide (Mondays). Last semaglutide 03/31/23. Uses a Dexcom, at least intermittently.   Most recent lab results during this week's admission included sodium 130, potassium 4.3, glucose 200, BUN 13, creatinine 0.73, AST 30, ALT 32, WBC 5.8, hemoglobin 14.3, hematocrit 42.1, platelet count 220, A1c 10.8%.  Updated labs on arrival as indicated per vascular surgery.  Anesthesia team to evaluate on the day of surgery.  Continuing aspirin.  Reported holding Plavix starting 04/16/2023.   VS:  BP Readings from Last 3  Encounters:  04/15/23 123/78  02/25/23 (!) 140/86  11/25/22 107/68   Pulse Readings from Last 3 Encounters:  04/15/23 92  02/25/23 82  11/25/22 69     PROVIDERS: Tower, Audrie Gallus, MD is PCP  Lorine Bears, MD is PV cardiologist Altamese Blanding, MD is endocrinologist Stan Head, MD is GI   LABS: Most recent lab results in [facility] include: Lab Results  Component Value Date   WBC 5.8 04/14/2023   HGB 14.3 04/14/2023   HCT 42.1 04/14/2023   PLT 220 04/14/2023   GLUCOSE 200 (H) 04/14/2023   ALT 32 04/12/2023   AST 30 04/12/2023   NA 130 (L) 04/14/2023   K 4.3 04/14/2023   CL 97 (L) 04/14/2023   CREATININE 0.73 04/14/2023   BUN 13 04/14/2023   CO2 22 04/14/2023   INR 1.1 (H) 11/25/2022   HGBA1C 10.8 (H) 04/12/2023    OTHER:  Colonoscopy 11/25/2022: Impression: - Two diminutive polyps in the proximal descending colon, removed with cold snare.  Resected and retrieved. - Single nonbleeding colonic angioplastic lesion. - Diverticulosis in the sigmoid colon. - The examination was otherwise normal on direct and retroflexion views. - Personal history of colonic polyps.  02/2013 5 tubular adenomas max 7 mm,  11/2018 5 diminutive adenomatous  EGD 11/25/2022: Impression: - Normal esophagus. - Portal hypertensive gastropathy. - The examination was otherwise normal. - No specimens collected.   IMAGES: MRI Left foot 04/12/23: IMPRESSION: 1. Small ulceration at the dorsal tip of the great toe with exposed bone. Mild marrow edema in the first distal phalanx is worrisome for early osteomyelitis.    EKG: 08/06/22: NSR  CV: Aortogram with LLE angiogram 04/15/23: Findings: Widely patent infrarenal aorta and bilateral renal arteries.  Widely patent bilateral iliac systems.  Widely patent left common femoral and profunda arteries.  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  The popliteal artery is occluded the peroneal artery is reconstituted distally  which fills the DP on the foot.  The PT is chronically occluded and does not appear to reconstitute.   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) 08/04/2018   Diabetes mellitus    Type II   Hyperlipidemia    Hypertension    NSAID-induced gastric ulcer 12/13/2018   EGD 11/2018 H pylori neg Omeprazole started   Obesity    Personal history of colonic adenomas 03/09/2013   Tinea pedis    Chronic    Past Surgical History:  Procedure Laterality Date   ABDOMINAL AORTOGRAM W/LOWER EXTREMITY N/A 04/15/2023   Procedure: ABDOMINAL AORTOGRAM W/LOWER EXTREMITY;  Surgeon: Daria Pastures, MD;  Location: [facility] INVASIVE CV LAB;  Service: Cardiovascular;  Laterality: N/A;   COLONOSCOPY     COLONOSCOPY W/ POLYPECTOMY  2014   IR PARACENTESIS  07/02/2018   IR TRANSCATHETER BX  07/17/2018   IR VENOGRAM HEPATIC WO HEMODYNAMIC EVALUATION  07/17/2018   myringectomy  06/2005   POLYPECTOMY     UMBILICAL HERNIA REPAIR  01/1997   VASECTOMY  07/2002    MEDICATIONS: No current facility-administered medications for this encounter.    clopidogrel (PLAVIX) 75 MG tablet   amoxicillin-clavulanate (AUGMENTIN) 875-125 MG tablet   aspirin EC 81 MG tablet   clotrimazole-betamethasone (LOTRISONE) cream   Continuous Glucose Sensor (DEXCOM G7 SENSOR) MISC   doxycycline (VIBRA-TABS) 100 MG tablet   Evolocumab (REPATHA SURECLICK) 140 MG/ML SOAJ   ezetimibe (ZETIA) 10 MG tablet   gabapentin (NEURONTIN) 100 MG capsule   glucose blood (ONETOUCH VERIO) test strip   insulin glargine (LANTUS SOLOSTAR) 100 UNIT/ML Solostar Pen   Insulin Pen Needle 31G X 8 MM MISC   Semaglutide,0.25 or 0.5MG /DOS, 2 MG/3ML SOPN   spironolactone (ALDACTONE) 50 MG tablet   tadalafil (CIALIS) 20 MG tablet   thiamine 100 MG tablet   Shonna Chock, PA-C Surgical Short Stay/Anesthesiology [facility] Phone [phone] [facility] Phone [phone] 04/16/2023  4:19 PM

## 2023-04-16 NOTE — Progress Notes (Signed)
SDW CALL  Patient was given pre-op instructions over the phone. The opportunity was given for the patient to ask questions. No further questions asked. Patient verbalized understanding of instructions given.   PCP - Idamae Schuller Tower,MD Cardiologist - Jerolyn Center Arida,MD  PPM/ICD - denies Device Orders -  Rep Notified -   Chest x-ray - na EKG - 08/06/22 Stress Test - 02/10/08 ECHO - denies Cardiac Cath - denies  Sleep Study - denies CPAP -   Fasting Blood Sugar - 200's Checks Blood Sugar - uses Dexcom;doesn't have one on currently  Blood Thinner Instructions:hold Plavix starting today.  Aspirin Instructions:continue.  ERAS Protcol -no PRE-SURGERY Ensure or G2-   COVID TEST- na   Anesthesia review: yes recent hospitalization-acute worsening foot pain;abnormal ABI's. Hx-DM,HTN,cirrhosis,smoker.  Patient denies shortness of breath, fever, cough and chest pain over the phone call    Surgical Instructions    Your procedure is scheduled on November 29  Report to [facility] Main Entrance "A" at 0530 A.M., then check in with the Admitting office.  Call this number if you have problems the morning of surgery:  [phone]    Remember:  Do not eat or drink anything after midnight the night before your surgery   Take these medicines the morning of surgery with A SIP OF WATER:  Augmentin,aspirin,Doxycycline,Zetia, Gabapentin. As of today, STOP taking any Aspirin (unless otherwise instructed by  Aleve, Naproxen, Ibuprofen, Motrin, Advil, Goody's, BC's, all herbal medications, fish oil, and all vitamins.  Hold Plavix starting today.    WHAT DO I DO ABOUT MY DIABETES MEDICATION?     THE MORNING OF SURGERY, take 5 units of Lantus insulin.   Check your blood sugar the morning of your surgery when you wake up and every 2 hours until you get to the Short Stay unit.  If your blood sugar is less than 70 mg/dL, you will need to treat for low blood sugar: Do not take insulin. Treat  a low blood sugar (less than 70 mg/dL) with  cup of clear juice (cranberry or apple), 4 glucose tablets, OR glucose gel. Recheck blood sugar in 15 minutes after treatment (to make sure it is greater than 70 mg/dL). If your blood sugar is not greater than 70 mg/dL on recheck, call [phone] for further instructions. Report your blood sugar to the short stay nurse when you get to Short Stay.  [facility] is not responsible for any belongings or valuables. .   Do NOT Smoke (Tobacco/Vaping)  24 hours prior to your procedure  If you use a CPAP at night, you may bring your mask for your overnight stay.   Contacts, glasses, hearing aids, dentures or partials may not be worn into surgery, please bring cases for these belongings   Patients discharged the day of surgery will not be allowed to drive home, and someone needs to stay with them for 24 hours.   SURGICAL WAITING ROOM VISITATION You may have 1 visitor in the pre-op area at a time determined by the pre-op nurse. (Visitor may not switch out)  Special instructions:    Oral Hygiene is also important to reduce your risk of infection.  Remember - BRUSH YOUR TEETH THE MORNING OF SURGERY WITH YOUR REGULAR TOOTHPASTE   Day of Surgery:  Take a shower the day of or night before with antibacterial soap. Wear Clean/Comfortable clothing the morning of surgery Do not apply any deodorants/lotions.   Do not wear jewelry or makeup Do not wear lotions, powders, perfumes/colognes,  or deodorant. Do not shave 48 hours prior to surgery.  Men may shave face and neck. Do not bring valuables to the hospital. Do not wear nail polish, gel polish, artificial nails, or any other type of covering on natural nails (fingers and toes) If you have artificial nails or gel coating that need to be removed by a nail salon, please have this removed prior to surgery. Artificial nails or gel coating may interfere with anesthesia's ability to adequately monitor your vital  signs. Remember to brush your teeth WITH YOUR REGULAR TOOTHPASTE.

## 2023-04-18 ENCOUNTER — Encounter (HOSPITAL_COMMUNITY): Payer: Self-pay | Admitting: Vascular Surgery

## 2023-04-18 ENCOUNTER — Encounter (HOSPITAL_COMMUNITY)
Admission: RE | Disposition: A | Discharge status: Home / Self Care | Payer: Self-pay | Source: Home / Self Care | Attending: Vascular Surgery

## 2023-04-18 ENCOUNTER — Other Ambulatory Visit: Payer: Self-pay

## 2023-04-18 ENCOUNTER — Inpatient Hospital Stay (HOSPITAL_COMMUNITY): Payer: No Typology Code available for payment source | Admitting: Vascular Surgery

## 2023-04-18 ENCOUNTER — Inpatient Hospital Stay (HOSPITAL_COMMUNITY)
Admission: RE | Admit: 2023-04-18 | Discharge: 2023-04-21 | DRG: 253 | Disposition: A | Discharge status: Home / Self Care | Payer: No Typology Code available for payment source | Attending: Vascular Surgery | Admitting: Vascular Surgery

## 2023-04-18 DIAGNOSIS — F17210 Nicotine dependence, cigarettes, uncomplicated: Secondary | ICD-10-CM | POA: Diagnosis present

## 2023-04-18 DIAGNOSIS — K7460 Unspecified cirrhosis of liver: Secondary | ICD-10-CM | POA: Diagnosis present

## 2023-04-18 DIAGNOSIS — I70262 Atherosclerosis of native arteries of extremities with gangrene, left leg: Secondary | ICD-10-CM | POA: Diagnosis present

## 2023-04-18 DIAGNOSIS — E785 Hyperlipidemia, unspecified: Secondary | ICD-10-CM | POA: Diagnosis present

## 2023-04-18 DIAGNOSIS — E1151 Type 2 diabetes mellitus with diabetic peripheral angiopathy without gangrene: Secondary | ICD-10-CM

## 2023-04-18 DIAGNOSIS — Z8249 Family history of ischemic heart disease and other diseases of the circulatory system: Secondary | ICD-10-CM | POA: Diagnosis not present

## 2023-04-18 DIAGNOSIS — K7581 Nonalcoholic steatohepatitis (NASH): Secondary | ICD-10-CM | POA: Diagnosis present

## 2023-04-18 DIAGNOSIS — E1169 Type 2 diabetes mellitus with other specified complication: Secondary | ICD-10-CM | POA: Diagnosis present

## 2023-04-18 DIAGNOSIS — E1152 Type 2 diabetes mellitus with diabetic peripheral angiopathy with gangrene: Principal | ICD-10-CM | POA: Diagnosis present

## 2023-04-18 DIAGNOSIS — I998 Other disorder of circulatory system: Principal | ICD-10-CM | POA: Diagnosis present

## 2023-04-18 DIAGNOSIS — Z7985 Long-term (current) use of injectable non-insulin antidiabetic drugs: Secondary | ICD-10-CM | POA: Diagnosis not present

## 2023-04-18 DIAGNOSIS — Z7902 Long term (current) use of antithrombotics/antiplatelets: Secondary | ICD-10-CM | POA: Diagnosis not present

## 2023-04-18 DIAGNOSIS — Z794 Long term (current) use of insulin: Secondary | ICD-10-CM | POA: Diagnosis not present

## 2023-04-18 DIAGNOSIS — Z9889 Other specified postprocedural states: Secondary | ICD-10-CM

## 2023-04-18 DIAGNOSIS — E11621 Type 2 diabetes mellitus with foot ulcer: Secondary | ICD-10-CM | POA: Diagnosis present

## 2023-04-18 DIAGNOSIS — E669 Obesity, unspecified: Secondary | ICD-10-CM | POA: Diagnosis present

## 2023-04-18 DIAGNOSIS — Z7982 Long term (current) use of aspirin: Secondary | ICD-10-CM

## 2023-04-18 DIAGNOSIS — L97529 Non-pressure chronic ulcer of other part of left foot with unspecified severity: Secondary | ICD-10-CM | POA: Diagnosis present

## 2023-04-18 DIAGNOSIS — M869 Osteomyelitis, unspecified: Secondary | ICD-10-CM | POA: Diagnosis present

## 2023-04-18 DIAGNOSIS — Z6832 Body mass index (BMI) 32.0-32.9, adult: Secondary | ICD-10-CM

## 2023-04-18 DIAGNOSIS — I70222 Atherosclerosis of native arteries of extremities with rest pain, left leg: Secondary | ICD-10-CM | POA: Diagnosis present

## 2023-04-18 DIAGNOSIS — I10 Essential (primary) hypertension: Secondary | ICD-10-CM | POA: Diagnosis present

## 2023-04-18 HISTORY — PX: VEIN HARVEST: SHX6363

## 2023-04-18 HISTORY — PX: BYPASS GRAFT FEMORAL-PERONEAL: SHX5762

## 2023-04-18 LAB — GLUCOSE, CAPILLARY
Glucose-Capillary: 239 mg/dL — ABNORMAL HIGH (ref 70–99)
Glucose-Capillary: 201 mg/dL — ABNORMAL HIGH (ref 70–99)
Glucose-Capillary: 239 mg/dL — ABNORMAL HIGH (ref 70–99)
Glucose-Capillary: 211 mg/dL — ABNORMAL HIGH (ref 70–99)
Glucose-Capillary: 231 mg/dL — ABNORMAL HIGH (ref 70–99)

## 2023-04-18 LAB — ABO/RH: ABO/RH(D): O POS

## 2023-04-18 LAB — POCT I-STAT, CHEM 8
BUN: 14 mg/dL (ref 8–23)
BUN: 16 mg/dL (ref 8–23)
Calcium, Ion: 1.13 mmol/L — ABNORMAL LOW (ref 1.15–1.40)
Calcium, Ion: 1.16 mmol/L (ref 1.15–1.40)
Chloride: 108 mmol/L (ref 98–111)
Chloride: 104 mmol/L (ref 98–111)
Creatinine, Ser: 0.5 mg/dL — ABNORMAL LOW (ref 0.61–1.24)
Creatinine, Ser: 0.6 mg/dL — ABNORMAL LOW (ref 0.61–1.24)
Glucose, Bld: 221 mg/dL — ABNORMAL HIGH (ref 70–99)
Glucose, Bld: 203 mg/dL — ABNORMAL HIGH (ref 70–99)
HCT: 44 % (ref 39.0–52.0)
HCT: 38 % — ABNORMAL LOW (ref 39.0–52.0)
Hemoglobin: 15 g/dL (ref 13.0–17.0)
Hemoglobin: 12.9 g/dL — ABNORMAL LOW (ref 13.0–17.0)
Potassium: 3.4 mmol/L — ABNORMAL LOW (ref 3.5–5.1)
Potassium: 4.1 mmol/L (ref 3.5–5.1)
Sodium: 141 mmol/L (ref 135–145)
Sodium: 138 mmol/L (ref 135–145)
TCO2: 23 mmol/L (ref 22–32)
TCO2: 24 mmol/L (ref 22–32)

## 2023-04-18 LAB — URINALYSIS, ROUTINE W REFLEX MICROSCOPIC
Bacteria, UA: NONE SEEN
Bilirubin Urine: NEGATIVE
Glucose, UA: >=500 mg/dL — AB
Hgb urine dipstick: NEGATIVE
Ketones, ur: NEGATIVE mg/dL
Leukocytes,Ua: NEGATIVE
Nitrite: NEGATIVE
Protein, ur: 100 mg/dL — AB
Specific Gravity, Urine: 1.02 (ref 1.005–1.030)
pH: 5 (ref 5.0–8.0)

## 2023-04-18 LAB — CBC
HCT: 46.5 % (ref 39.0–52.0)
HCT: 40.2 % (ref 39.0–52.0)
Hemoglobin: 15.8 g/dL (ref 13.0–17.0)
Hemoglobin: 14 g/dL (ref 13.0–17.0)
MCH: 33.7 pg (ref 26.0–34.0)
MCH: 34.4 pg — ABNORMAL HIGH (ref 26.0–34.0)
MCHC: 34 g/dL (ref 30.0–36.0)
MCHC: 34.8 g/dL (ref 30.0–36.0)
MCV: 99.1 fL (ref 80.0–100.0)
MCV: 98.8 fL (ref 80.0–100.0)
Platelets: 266 10*3/uL (ref 150–400)
Platelets: 275 10*3/uL (ref 150–400)
RBC: 4.69 MIL/uL (ref 4.22–5.81)
RBC: 4.07 MIL/uL — ABNORMAL LOW (ref 4.22–5.81)
RDW: 12.7 % (ref 11.5–15.5)
RDW: 12.8 % (ref 11.5–15.5)
WBC: 7.1 10*3/uL (ref 4.0–10.5)
WBC: 11.3 10*3/uL — ABNORMAL HIGH (ref 4.0–10.5)
nRBC: 0 % (ref 0.0–0.2)
nRBC: 0 % (ref 0.0–0.2)

## 2023-04-18 LAB — POCT ACTIVATED CLOTTING TIME: Activated Clotting Time: 0 s

## 2023-04-18 LAB — TYPE AND SCREEN
ABO/RH(D): O POS
Antibody Screen: NEGATIVE

## 2023-04-18 LAB — COMPREHENSIVE METABOLIC PANEL
ALT: 80 U/L — ABNORMAL HIGH (ref 0–44)
AST: 55 U/L — ABNORMAL HIGH (ref 15–41)
Albumin: 3.8 g/dL (ref 3.5–5.0)
Alkaline Phosphatase: 164 U/L — ABNORMAL HIGH (ref 38–126)
Anion gap: 11 (ref 5–15)
BUN: 18 mg/dL (ref 8–23)
CO2: 21 mmol/L — ABNORMAL LOW (ref 22–32)
Calcium: 9.6 mg/dL (ref 8.9–10.3)
Chloride: 102 mmol/L (ref 98–111)
Creatinine, Ser: 0.92 mg/dL (ref 0.61–1.24)
GFR, Estimated: >60 mL/min (ref >60)
Glucose, Bld: 263 mg/dL — ABNORMAL HIGH (ref 70–99)
Potassium: 4 mmol/L (ref 3.5–5.1)
Sodium: 134 mmol/L — ABNORMAL LOW (ref 135–145)
Total Bilirubin: 0.6 mg/dL (ref <1.2)
Total Protein: 7.9 g/dL (ref 6.5–8.1)

## 2023-04-18 LAB — SURGICAL PCR SCREEN
MRSA, PCR: NEGATIVE
Staphylococcus aureus: NEGATIVE

## 2023-04-18 LAB — CREATININE, SERUM
Creatinine, Ser: 0.76 mg/dL (ref 0.61–1.24)
GFR, Estimated: >60 mL/min (ref >60)

## 2023-04-18 LAB — APTT: aPTT: 26 s (ref 24–36)

## 2023-04-18 SURGERY — CREATION, BYPASS, ARTERIAL, FEMORAL TO PERONEAL, USING GRAFT
Anesthesia: General | Site: Leg Lower | Laterality: Left

## 2023-04-18 MED ORDER — SODIUM CHLORIDE 0.9 % IV SOLN: INTRAVENOUS | Status: DC

## 2023-04-18 MED ORDER — DOCUSATE SODIUM 100 MG PO CAPS
100.0000 mg | ORAL_CAPSULE | Freq: Every day | ORAL | Status: DC
Start: 2023-04-19 — End: 2023-04-21
  Administered 2023-04-19 – 2023-04-21 (×3): 100 mg via ORAL
  Filled 2023-04-18 (×3): qty 1

## 2023-04-18 MED ORDER — SPIRONOLACTONE 25 MG PO TABS
50.0000 mg | ORAL_TABLET | Freq: Every day | ORAL | Status: DC
  Administered 2023-04-18 – 2023-04-21 (×4): 50 mg via ORAL
  Filled 2023-04-18 (×4): qty 2

## 2023-04-18 MED ORDER — CHLORHEXIDINE GLUCONATE CLOTH 2 % EX PADS: 6.0000 | MEDICATED_PAD | Freq: Once | CUTANEOUS | Status: DC

## 2023-04-18 MED ORDER — INSULIN GLARGINE-YFGN 100 UNIT/ML ~~LOC~~ SOLN
10.0000 [IU] | Freq: Every day | SUBCUTANEOUS | Status: DC
  Administered 2023-04-18 – 2023-04-21 (×4): 10 [IU] via SUBCUTANEOUS
  Filled 2023-04-18 (×5): qty 0.1

## 2023-04-18 MED ORDER — OXYCODONE HCL 5 MG PO TABS: 5.0000 mg | ORAL_TABLET | Freq: Once | ORAL | Status: DC | PRN

## 2023-04-18 MED ORDER — POTASSIUM CHLORIDE CRYS ER 20 MEQ PO TBCR
20.0000 meq | EXTENDED_RELEASE_TABLET | Freq: Every day | ORAL | Status: DC | PRN
Start: 2023-04-18 — End: 2023-04-21

## 2023-04-18 MED ORDER — OXYCODONE HCL 5 MG/5ML PO SOLN: 5.0000 mg | Freq: Once | ORAL | Status: DC | PRN

## 2023-04-18 MED ORDER — HYDROMORPHONE HCL 1 MG/ML IJ SOLN
INTRAMUSCULAR | Status: AC
  Filled 2023-04-18: qty 1

## 2023-04-18 MED ORDER — POLYETHYLENE GLYCOL 3350 17 G PO PACK
17.0000 g | PACK | Freq: Every day | ORAL | Status: DC | PRN
  Administered 2023-04-20: 17 g via ORAL
  Filled 2023-04-18: qty 1

## 2023-04-18 MED ORDER — ACETAMINOPHEN 650 MG RE SUPP: 325.0000 mg | RECTAL | Status: DC | PRN

## 2023-04-18 MED ORDER — ASPIRIN 81 MG PO TBEC
81.0000 mg | DELAYED_RELEASE_TABLET | Freq: Every day | ORAL | Status: DC
  Administered 2023-04-19 – 2023-04-21 (×3): 81 mg via ORAL
  Filled 2023-04-18 (×3): qty 1

## 2023-04-18 MED ORDER — GABAPENTIN 100 MG PO CAPS: 100.0000 mg | ORAL_CAPSULE | Freq: Three times a day (TID) | ORAL | Status: DC | PRN

## 2023-04-18 MED ORDER — HYDROMORPHONE HCL 1 MG/ML IJ SOLN
INTRAMUSCULAR | Status: AC
  Filled 2023-04-18: qty 0.5

## 2023-04-18 MED ORDER — MIDAZOLAM HCL 2 MG/2ML IJ SOLN
INTRAMUSCULAR | Status: DC | PRN
  Administered 2023-04-18: 2 mg via INTRAVENOUS

## 2023-04-18 MED ORDER — INSULIN ASPART 100 UNIT/ML IJ SOLN
0.0000 [IU] | INTRAMUSCULAR | Status: AC | PRN
  Administered 2023-04-18: 4 [IU] via SUBCUTANEOUS
  Administered 2023-04-18: 6 [IU] via SUBCUTANEOUS
  Filled 2023-04-18: qty 1

## 2023-04-18 MED ORDER — THIAMINE MONONITRATE 100 MG PO TABS
100.0000 mg | ORAL_TABLET | Freq: Every day | ORAL | Status: DC
  Administered 2023-04-19 – 2023-04-21 (×3): 100 mg via ORAL
  Filled 2023-04-18 (×6): qty 1

## 2023-04-18 MED ORDER — ONDANSETRON HCL 4 MG/2ML IJ SOLN
INTRAMUSCULAR | Status: AC
  Filled 2023-04-18: qty 2

## 2023-04-18 MED ORDER — PHENYLEPHRINE 80 MCG/ML (10ML) SYRINGE FOR IV PUSH (FOR BLOOD PRESSURE SUPPORT)
PREFILLED_SYRINGE | INTRAVENOUS | Status: DC | PRN
  Administered 2023-04-18 (×3): 80 ug via INTRAVENOUS
  Administered 2023-04-18: 160 ug via INTRAVENOUS
  Administered 2023-04-18: 80 ug via INTRAVENOUS

## 2023-04-18 MED ORDER — PROPOFOL 10 MG/ML IV BOLUS
INTRAVENOUS | Status: AC
  Filled 2023-04-18: qty 20

## 2023-04-18 MED ORDER — MORPHINE SULFATE (PF) 2 MG/ML IV SOLN
2.0000 mg | INTRAVENOUS | Status: DC | PRN
  Administered 2023-04-18 – 2023-04-19 (×4): 2 mg via INTRAVENOUS
  Filled 2023-04-18 (×4): qty 1

## 2023-04-18 MED ORDER — ROCURONIUM BROMIDE 10 MG/ML (PF) SYRINGE
PREFILLED_SYRINGE | INTRAVENOUS | Status: AC
  Filled 2023-04-18: qty 10

## 2023-04-18 MED ORDER — LIDOCAINE 2% (20 MG/ML) 5 ML SYRINGE
INTRAMUSCULAR | Status: DC | PRN
  Administered 2023-04-18: 100 mg via INTRAVENOUS

## 2023-04-18 MED ORDER — AMOXICILLIN-POT CLAVULANATE 875-125 MG PO TABS
1.0000 | ORAL_TABLET | Freq: Two times a day (BID) | ORAL | Status: DC
  Administered 2023-04-18 – 2023-04-21 (×6): 1 via ORAL
  Filled 2023-04-18 (×6): qty 1

## 2023-04-18 MED ORDER — ALUM & MAG HYDROXIDE-SIMETH 200-200-20 MG/5ML PO SUSP
15.0000 mL | ORAL | Status: DC | PRN
  Administered 2023-04-19: 15 mL via ORAL

## 2023-04-18 MED ORDER — HYDROMORPHONE HCL 1 MG/ML IJ SOLN
0.2500 mg | INTRAMUSCULAR | Status: DC | PRN
Start: 2023-04-18 — End: 2023-04-18
  Administered 2023-04-18 (×4): 0.5 mg via INTRAVENOUS

## 2023-04-18 MED ORDER — ONDANSETRON HCL 4 MG/2ML IJ SOLN
4.0000 mg | Freq: Four times a day (QID) | INTRAMUSCULAR | Status: DC | PRN
Start: 2023-04-18 — End: 2023-04-21

## 2023-04-18 MED ORDER — ONDANSETRON HCL 4 MG/2ML IJ SOLN: 4.0000 mg | Freq: Once | INTRAMUSCULAR | Status: DC | PRN

## 2023-04-18 MED ORDER — ONDANSETRON HCL 4 MG/2ML IJ SOLN
INTRAMUSCULAR | Status: DC | PRN
  Administered 2023-04-18: 4 mg via INTRAVENOUS

## 2023-04-18 MED ORDER — MAGNESIUM SULFATE 2 GM/50ML IV SOLN: 2.0000 g | Freq: Every day | INTRAVENOUS | Status: DC | PRN

## 2023-04-18 MED ORDER — BISACODYL 5 MG PO TBEC
5.0000 mg | DELAYED_RELEASE_TABLET | Freq: Every day | ORAL | Status: DC | PRN
  Administered 2023-04-20: 5 mg via ORAL
  Filled 2023-04-18: qty 1

## 2023-04-18 MED ORDER — HEPARIN SODIUM (PORCINE) 5000 UNIT/ML IJ SOLN
5000.0000 [IU] | Freq: Three times a day (TID) | INTRAMUSCULAR | Status: DC
Start: 2023-04-19 — End: 2023-04-21
  Administered 2023-04-19 – 2023-04-21 (×7): 5000 [IU] via SUBCUTANEOUS
  Filled 2023-04-18 (×7): qty 1

## 2023-04-18 MED ORDER — PHENYLEPHRINE HCL-NACL 20-0.9 MG/250ML-% IV SOLN
INTRAVENOUS | Status: DC | PRN
  Administered 2023-04-18: 25 ug/min via INTRAVENOUS

## 2023-04-18 MED ORDER — SODIUM CHLORIDE 0.9 % IV SOLN: 500.0000 mL | Freq: Once | INTRAVENOUS | Status: DC | PRN

## 2023-04-18 MED ORDER — CHLORHEXIDINE GLUCONATE 0.12 % MT SOLN
15.0000 mL | Freq: Once | OROMUCOSAL | Status: AC
  Administered 2023-04-18: 15 mL via OROMUCOSAL
  Filled 2023-04-18: qty 15

## 2023-04-18 MED ORDER — HYDROMORPHONE HCL 1 MG/ML IJ SOLN
INTRAMUSCULAR | Status: DC | PRN
  Administered 2023-04-18: .5 mg via INTRAVENOUS

## 2023-04-18 MED ORDER — PROPOFOL 10 MG/ML IV BOLUS
INTRAVENOUS | Status: DC | PRN
  Administered 2023-04-18: 120 mg via INTRAVENOUS

## 2023-04-18 MED ORDER — GUAIFENESIN-DM 100-10 MG/5ML PO SYRP: 15.0000 mL | ORAL_SOLUTION | ORAL | Status: DC | PRN

## 2023-04-18 MED ORDER — EZETIMIBE 10 MG PO TABS
10.0000 mg | ORAL_TABLET | Freq: Every day | ORAL | Status: DC
  Administered 2023-04-18 – 2023-04-21 (×4): 10 mg via ORAL
  Filled 2023-04-18 (×4): qty 1

## 2023-04-18 MED ORDER — DOXYCYCLINE HYCLATE 100 MG PO TABS
100.0000 mg | ORAL_TABLET | Freq: Two times a day (BID) | ORAL | Status: DC
  Administered 2023-04-18 – 2023-04-21 (×6): 100 mg via ORAL
  Filled 2023-04-18 (×6): qty 1

## 2023-04-18 MED ORDER — ACETAMINOPHEN 325 MG PO TABS: 325.0000 mg | ORAL_TABLET | ORAL | Status: DC | PRN

## 2023-04-18 MED ORDER — SEMAGLUTIDE(0.25 OR 0.5MG/DOS) 2 MG/3ML ~~LOC~~ SOPN
0.2500 mg | PEN_INJECTOR | SUBCUTANEOUS | Status: DC
Start: 2023-04-21 — End: 2023-04-18

## 2023-04-18 MED ORDER — LACTATED RINGERS IV SOLN: INTRAVENOUS | Status: DC

## 2023-04-18 MED ORDER — OXYCODONE HCL 5 MG PO TABS
5.0000 mg | ORAL_TABLET | ORAL | Status: DC | PRN
  Administered 2023-04-18 – 2023-04-19 (×3): 5 mg via ORAL
  Filled 2023-04-18 (×3): qty 1

## 2023-04-18 MED ORDER — ORAL CARE MOUTH RINSE: 15.0000 mL | Freq: Once | OROMUCOSAL | Status: AC

## 2023-04-18 MED ORDER — PHENYLEPHRINE 80 MCG/ML (10ML) SYRINGE FOR IV PUSH (FOR BLOOD PRESSURE SUPPORT)
PREFILLED_SYRINGE | INTRAVENOUS | Status: AC
  Filled 2023-04-18: qty 10

## 2023-04-18 MED ORDER — 0.9 % SODIUM CHLORIDE (POUR BTL) OPTIME
TOPICAL | Status: DC | PRN
  Administered 2023-04-18: 2000 mL

## 2023-04-18 MED ORDER — CEFAZOLIN SODIUM-DEXTROSE 2-4 GM/100ML-% IV SOLN
2.0000 g | Freq: Three times a day (TID) | INTRAVENOUS | Status: AC
Start: 2023-04-18 — End: 2023-04-19
  Administered 2023-04-18 (×2): 2 g via INTRAVENOUS
  Filled 2023-04-18 (×2): qty 100

## 2023-04-18 MED ORDER — METOPROLOL TARTRATE 5 MG/5ML IV SOLN: 2.0000 mg | INTRAVENOUS | Status: DC | PRN

## 2023-04-18 MED ORDER — PROTAMINE SULFATE 10 MG/ML IV SOLN
INTRAVENOUS | Status: DC | PRN
  Administered 2023-04-18: 25 mg via INTRAVENOUS

## 2023-04-18 MED ORDER — INSULIN ASPART 100 UNIT/ML IJ SOLN
0.0000 [IU] | Freq: Three times a day (TID) | INTRAMUSCULAR | Status: DC
  Administered 2023-04-18: 3 [IU] via SUBCUTANEOUS
  Administered 2023-04-19 (×2): 8 [IU] via SUBCUTANEOUS
  Administered 2023-04-19 – 2023-04-20 (×3): 5 [IU] via SUBCUTANEOUS
  Administered 2023-04-20: 8 [IU] via SUBCUTANEOUS
  Administered 2023-04-21: 5 [IU] via SUBCUTANEOUS

## 2023-04-18 MED ORDER — PHENOL 1.4 % MT LIQD: 1.0000 | OROMUCOSAL | Status: DC | PRN

## 2023-04-18 MED ORDER — HYDRALAZINE HCL 20 MG/ML IJ SOLN: 5.0000 mg | INTRAMUSCULAR | Status: DC | PRN

## 2023-04-18 MED ORDER — HEPARIN 6000 UNIT IRRIGATION SOLUTION
Status: AC
  Filled 2023-04-18: qty 500

## 2023-04-18 MED ORDER — HEPARIN SODIUM (PORCINE) 1000 UNIT/ML IJ SOLN
INTRAMUSCULAR | Status: DC | PRN
  Administered 2023-04-18: 10000 [IU] via INTRAVENOUS

## 2023-04-18 MED ORDER — FENTANYL CITRATE (PF) 250 MCG/5ML IJ SOLN
INTRAMUSCULAR | Status: DC | PRN
  Administered 2023-04-18: 100 ug via INTRAVENOUS
  Administered 2023-04-18 (×3): 50 ug via INTRAVENOUS

## 2023-04-18 MED ORDER — PANTOPRAZOLE SODIUM 40 MG PO TBEC
40.0000 mg | DELAYED_RELEASE_TABLET | Freq: Every day | ORAL | Status: DC
  Administered 2023-04-19 – 2023-04-21 (×3): 40 mg via ORAL
  Filled 2023-04-18 (×3): qty 1

## 2023-04-18 MED ORDER — MIDAZOLAM HCL 2 MG/2ML IJ SOLN
INTRAMUSCULAR | Status: AC
  Filled 2023-04-18: qty 2

## 2023-04-18 MED ORDER — CEFAZOLIN SODIUM-DEXTROSE 2-4 GM/100ML-% IV SOLN
2.0000 g | INTRAVENOUS | Status: DC
  Filled 2023-04-18: qty 100

## 2023-04-18 MED ORDER — CLOPIDOGREL BISULFATE 75 MG PO TABS
75.0000 mg | ORAL_TABLET | Freq: Every day | ORAL | Status: DC
  Administered 2023-04-19 – 2023-04-21 (×3): 75 mg via ORAL
  Filled 2023-04-18 (×3): qty 1

## 2023-04-18 MED ORDER — HEMOSTATIC AGENTS (NO CHARGE) OPTIME
TOPICAL | Status: DC | PRN
  Administered 2023-04-18: 1 via TOPICAL

## 2023-04-18 MED ORDER — SUGAMMADEX SODIUM 200 MG/2ML IV SOLN
INTRAVENOUS | Status: DC | PRN
  Administered 2023-04-18: 200 mg via INTRAVENOUS

## 2023-04-18 MED ORDER — LABETALOL HCL 5 MG/ML IV SOLN: 10.0000 mg | INTRAVENOUS | Status: DC | PRN

## 2023-04-18 MED ORDER — LIDOCAINE 2% (20 MG/ML) 5 ML SYRINGE
INTRAMUSCULAR | Status: AC
  Filled 2023-04-18: qty 5

## 2023-04-18 MED ORDER — HEPARIN 6000 UNIT IRRIGATION SOLUTION
Status: DC | PRN
  Administered 2023-04-18: 1

## 2023-04-18 MED ORDER — CEFAZOLIN SODIUM-DEXTROSE 2-3 GM-%(50ML) IV SOLR
INTRAVENOUS | Status: DC | PRN
  Administered 2023-04-18: 2 g via INTRAVENOUS

## 2023-04-18 MED ORDER — ROCURONIUM BROMIDE 10 MG/ML (PF) SYRINGE
PREFILLED_SYRINGE | INTRAVENOUS | Status: DC | PRN
  Administered 2023-04-18: 60 mg via INTRAVENOUS
  Administered 2023-04-18 (×2): 30 mg via INTRAVENOUS

## 2023-04-18 MED ORDER — FENTANYL CITRATE (PF) 250 MCG/5ML IJ SOLN
INTRAMUSCULAR | Status: AC
  Filled 2023-04-18: qty 5

## 2023-04-18 SURGICAL SUPPLY — 47 items
BAG COUNTER SPONGE SURGICOUNT (BAG) ×2 IMPLANT
BANDAGE ESMARK 6X9 LF (GAUZE/BANDAGES/DRESSINGS) IMPLANT
BNDG ESMARK 6X9 LF (GAUZE/BANDAGES/DRESSINGS) ×2
CANISTER SUCT 3000ML PPV (MISCELLANEOUS) ×2 IMPLANT
CANNULA VESSEL 3MM 2 BLNT TIP (CANNULA) IMPLANT
CLIP LIGATING EXTRA MED SLVR (CLIP) ×2 IMPLANT
CLIP LIGATING EXTRA SM BLUE (MISCELLANEOUS) ×2 IMPLANT
CUFF TOURN SGL QUICK 42 (TOURNIQUET CUFF) IMPLANT
CUFF TRNQT CYL 24X4X16.5-23 (TOURNIQUET CUFF) IMPLANT
CUFF TRNQT CYL 34X4.125X (TOURNIQUET CUFF) IMPLANT
DERMABOND ADVANCED .7 DNX12 (GAUZE/BANDAGES/DRESSINGS) ×2 IMPLANT
DRAIN CHANNEL 15F RND FF W/TCR (WOUND CARE) IMPLANT
DRAPE C-ARM 42X72 X-RAY (DRAPES) IMPLANT
DRAPE HALF SHEET 40X57 (DRAPES) IMPLANT
ELECT REM PT RETURN 9FT ADLT (ELECTROSURGICAL) ×2
ELECTRODE REM PT RTRN 9FT ADLT (ELECTROSURGICAL) ×2 IMPLANT
EVACUATOR SILICONE 100CC (DRAIN) IMPLANT
GLOVE BIO SURGEON STRL SZ7.5 (GLOVE) ×2 IMPLANT
GOWN STRL REUS W/ TWL LRG LVL3 (GOWN DISPOSABLE) ×4 IMPLANT
GOWN STRL REUS W/ TWL XL LVL3 (GOWN DISPOSABLE) ×2 IMPLANT
HEMOSTAT SNOW SURGICEL 2X4 (HEMOSTASIS) IMPLANT
INSERT FOGARTY SM (MISCELLANEOUS) IMPLANT
KIT BASIN OR (CUSTOM PROCEDURE TRAY) ×2 IMPLANT
KIT TURNOVER KIT B (KITS) ×2 IMPLANT
MARKER GRAFT CORONARY BYPASS (MISCELLANEOUS) IMPLANT
NS IRRIG 1000ML POUR BTL (IV SOLUTION) ×4 IMPLANT
PACK PERIPHERAL VASCULAR (CUSTOM PROCEDURE TRAY) ×2 IMPLANT
PAD ARMBOARD 7.5X6 YLW CONV (MISCELLANEOUS) ×4 IMPLANT
POWDER SURGICEL 3.0 GRAM (HEMOSTASIS) IMPLANT
SET MICROPUNCTURE 5F STIFF (MISCELLANEOUS) IMPLANT
SPONGE T-LAP 18X18 ~~LOC~~+RFID (SPONGE) IMPLANT
STOPCOCK 4 WAY LG BORE MALE ST (IV SETS) IMPLANT
SUT ETHILON 3 0 PS 1 (SUTURE) IMPLANT
SUT MNCRL AB 4-0 PS2 18 (SUTURE) ×4 IMPLANT
SUT PROLENE 5 0 C 1 24 (SUTURE) ×2 IMPLANT
SUT PROLENE 6 0 BV (SUTURE) ×2 IMPLANT
SUT PROLENE 7 0 BV 1 (SUTURE) IMPLANT
SUT SILK 2 0 SH (SUTURE) ×2 IMPLANT
SUT SILK 3-0 18XBRD TIE 12 (SUTURE) IMPLANT
SUT VIC AB 2-0 CT1 TAPERPNT 27 (SUTURE) ×4 IMPLANT
SUT VIC AB 3-0 SH 27X BRD (SUTURE) ×4 IMPLANT
TAPE UMBILICAL 1/8X30 (MISCELLANEOUS) IMPLANT
TOWEL GREEN STERILE (TOWEL DISPOSABLE) ×2 IMPLANT
TRAY FOLEY MTR SLVR 16FR STAT (SET/KITS/TRAYS/PACK) ×2 IMPLANT
TUBING EXTENTION W/L.L. (IV SETS) IMPLANT
UNDERPAD 30X36 HEAVY ABSORB (UNDERPADS AND DIAPERS) ×2 IMPLANT
WATER STERILE IRR 1000ML POUR (IV SOLUTION) ×2 IMPLANT

## 2023-04-18 NOTE — Anesthesia Procedure Notes (Addendum)
Arterial Line Insertion Start/End11/29/2024 8:00 AM, 04/18/2023 8:25 AM Performed by: Dorie Rank, CRNA, CRNA  Lidocaine 1% used for infiltration and patient sedated Right, radial was placed Catheter size: 20 G Hand hygiene performed , maximum sterile barriers used  and Seldinger technique used Allen's test indicative of satisfactory collateral circulation Attempts: 2 Procedure performed using ultrasound guided technique. Following insertion, Biopatch. Post procedure assessment: normal  Post procedure complications: unsuccessful attempts. Patient tolerated the procedure well with no immediate complications. Additional procedure comments: Dr Okey Dupre and Dr Ace Gins aware of difficulty and aware of ultrasound use. Ginette Otto, Scientist, clinical (histocompatibility and immunogenetics).

## 2023-04-18 NOTE — Progress Notes (Signed)
Pt arrived to unit from PACU. VSS, A/O x 4,  CCMD called ,CHG given, pt oriented to unit,Will continue to monitor.  Left leg x5 incisions skin glue level 0. Doppler all Pules bilateral legs    Everlean Cherry, RN    04/18/23 1349  Vitals  Temp 97.6 F (36.4 C)  Temp Source Oral  BP 122/86  MAP (mmHg) 98  BP Location Right Arm  BP Method Automatic  Patient Position (if appropriate) Lying  Pulse Rate Source Monitor  Level of Consciousness  Level of Consciousness Alert  MEWS COLOR  MEWS Score Color Green  Oxygen Therapy  SpO2 100 %  O2 Device Room Air  MEWS Score  MEWS Temp 0  MEWS Systolic 0  MEWS Pulse 0  MEWS RR 0  MEWS LOC 0  MEWS Score 0

## 2023-04-18 NOTE — Transfer of Care (Signed)
Immediate Anesthesia Transfer of Care Note  Patient: Zachary Lewis  Procedure(s) Performed: LEFT FEMORAL-PERONEAL ARTERY BYPASS (Left) VEIN HARVEST USING GREATER SAPHENOUS VEIN (Left: Leg Lower)  Patient Location: PACU  Anesthesia Type:General  Level of Consciousness: awake, drowsy, and patient cooperative  Airway & Oxygen Therapy: Patient Spontanous Breathing  Post-op Assessment: Report given to RN and Post -op Vital signs reviewed and stable  Post vital signs: Reviewed and stable  Last Vitals:  Vitals Value Taken Time  BP 103/67 04/18/23 1201  Temp    Pulse 87 04/18/23 1202  Resp 20 04/18/23 1202  SpO2 98 % 04/18/23 1202  Vitals shown include unfiled device data.  Last Pain:  Vitals:   04/18/23 0623  PainSc: 0-No pain         Complications: No notable events documented.

## 2023-04-18 NOTE — Op Note (Signed)
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 Randie Heinz, MD Assistant: Kayren Eaves, PA Procedure Performed: 1.  Harvest left greater saphenous vein 2.  Left SFA to peroneal artery bypass with nonreversed, ipsilateral, translocated greater saphenous vein  Indications: 62 year old male with history of 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  He has undergone angiography which demonstrates occlusion of his distal left SFA with reconstitution of the mid calf peroneal artery.  He is indicated for bypass from either the left common femoral artery versus the left SFA to the peroneal artery depending on availability of saphenous vein.  Experience assistant was necessary to facilitate exposure and harvest the great saphenous vein from the saphenofemoral junction to just above the knee where it became very diminutive as well as exposure of the SFA in the mid thigh and the peroneal artery in the mid calf and perform anastomosis at both levels.  Findings: The great saphenous vein was very diminutive below the knee.  This was harvested above the knee and did dilate to at least 3 mm in the distal aspect and closer to 4 mm proximally.  We elected to not reverse the vein given the size discrepancy.  Proximally the SFA was thickened however there was strong pulsatility.  Distally the peroneal artery was free of disease at the level of bypass and was serially dilated up to 3 mm prior to bypass.  At completion there was a very strong peroneal artery signal at the ankle that augmented with compression of the bypass graft.   Procedure:  The patient was identified in the holding area and taken to the operating room where he was placed upon  the operative table and general anesthesia was induced.  He was sterilely prepped and draped in the left lower extremity in the usual fashion, antibiotics were administered and timeout was called.  We first used ultrasound to map the saphenous vein unfortunately is very small below the knee.  I also evaluated the SFA and the distal third of the thigh this appeared to be healthy.  First the main incision above the knee and dissected down to the SFA where it was soft encircled this with Vesseloops were marked at for an area for bypass.  There was a strong Doppler signal within the SFA there.  Above the knee we then harvested the saphenous vein through 3 additional skip incisions.  This was taken up to the level of the saphenofemoral junction.  Below the knee we then made an incision to expose the below-knee popliteal artery took down the soleus muscle identified up to the anterior tibial artery and divided the anterior tibial crossing vein.  There were multiple crossing veins over the tibioperoneal trunk which were not divided I was able to identify the peroneal artery down to the mid calf where it was soft and we did not encircle this were dissected entirely free but there was a strong Doppler signal and it was soft and amenable for bypass.  We then tunneled below the above-knee and below-knee incisions and placed an umbilical tape and the patient was fully heparinized.  I then harvested the great saphenous vein totally from above the knee to the saphenofemoral junction where initially we tied it off but ultimately oversewed the saphenofemoral junction with  5-0 Prolene suture in a mattress fashion.  The vein was then prepared.  I spatulated the proximal aspect and clamped the SFA proximally and distally opened this longitudinally.  It was thickened but no endarterectomy was performed.  The vein was sewn into side with 5-0 Prolene suture.  Upon completion we allowed flushing through the vein itself and then lysed the  valves until there was very strong pulsatility through the vein.  This was then tunneled anatomically maintaining orientation.  We then placed the tourniquet above the knee and exsanguinated the left lower extremity and inflated the tourniquet to 250 mmHg.  Peroneal artery was opened where it was soft and where there was previously a Doppler signal.  This was free of disease at this level.  We dilated from 2 mm up to 3 mm serially.  The leg was straight and the vein was trimmed to size and spatulated and sewn into side with 6-0 Prolene suture.  Prior completion we allowed the tourniquet down and flushed in all directions.  Upon completion there was a very strong pulse within the peroneal artery and the wound bed confirmed with Doppler and Doppler signal at the ankle that augmented with compression of the graft.  Satisfied with this we irrigated all the wounds and we administered 25 mg of protamine.  Hemostasis was obtained all wounds were closed with Vicryl and Monocryl.  Dermabond placed at the skin level.  The patient was awakened from anesthesia having tolerated the procedure without any complication.  All counts were correct at completion.  EBL: 500cc  Dorsey Charette C. Randie Heinz, MD [facility] Office: [phone] Pager: [phone]

## 2023-04-18 NOTE — H&P (Signed)
History of Present Illness: This is a 62 y.o. male with pertinent medical history including type II DM, HTN, HLD, Cirrhosis, tobacco use and PAD who presented with worsening pain and numbness in left foot. He explains that last year in spring time he began having pain on ambulation in his calf. He said he could do okay on the treadmill at his house but when walking in park etc with his wife he would have to stop and rest after about 1/4 mile. He was seen by Cardiologist and started on medical management with Aspirin, Statin and Plavix. He says this helped with his symptoms. However about 2-3 weeks ago the numbness in his left foot began and has progressed to point of not being able to walk.In addition last week the left great toe nail fell off. He also has some other wounds on left 5th toe and plantar aspect of the left foot. He denies any fever or chills. He does smoke daily about 1/4 ppd.  On admission X-rays of the left foot noted cortical deficit in discontinuity along the plantar aspect of the first digit concerning for possible fracture versus osteomyelitis. Patient was started on empiric antibiotics of vancomycin and Zosyn. ABI was performed showing mild arterial disease on the RLE with ABI of 0.8 and significant arterial disease on the left with ABI of 0.3.   He has undergone angiography and states that he also quit smoking 7 days ago.  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) 08/04/2018   Diabetes mellitus    Type II   Hyperlipidemia    Hypertension    NSAID-induced gastric ulcer 12/13/2018   EGD 11/2018 H pylori neg Omeprazole started   Obesity    Personal history of colonic adenomas 03/09/2013   Tinea pedis    Chronic    Past Surgical History:  Procedure Laterality Date   ABDOMINAL AORTOGRAM W/LOWER EXTREMITY N/A 04/15/2023   Procedure: ABDOMINAL AORTOGRAM W/LOWER EXTREMITY;  Surgeon:  Daria Pastures, MD;  Location: [facility] INVASIVE CV LAB;  Service: Cardiovascular;  Laterality: N/A;   COLONOSCOPY     COLONOSCOPY W/ POLYPECTOMY  2014   IR PARACENTESIS  07/02/2018   IR TRANSCATHETER BX  07/17/2018   IR VENOGRAM HEPATIC WO HEMODYNAMIC EVALUATION  07/17/2018   myringectomy  06/2005   POLYPECTOMY     UMBILICAL HERNIA REPAIR  01/1997   VASECTOMY  07/2002    No Known Allergies  Prior to Admission medications   Medication Sig Start Date End Date Taking? Authorizing Provider  amoxicillin-clavulanate (AUGMENTIN) 875-125 MG tablet Take 1 tablet by mouth every 12 (twelve) hours for 10 days. 04/15/23 04/25/23 Yes Azucena Fallen, MD  aspirin EC 81 MG tablet Take 1 tablet (81 mg total) by mouth daily. Swallow whole. 03/27/21  Yes Iran Ouch, MD  clopidogrel (PLAVIX) 75 MG tablet Take 1 tablet (75 mg total) by mouth daily. 02/25/23  Yes Iran Ouch, MD  doxycycline (VIBRA-TABS) 100 MG tablet Take 1 tablet (100 mg total) by mouth every 12 (twelve) hours for 10 days. 04/15/23 04/25/23 Yes Azucena Fallen, MD  Evolocumab (REPATHA SURECLICK) 140 MG/ML SOAJ Inject 140 mg into the skin every 14 (fourteen) days. Patient taking differently: Inject 1 mL into the skin every 14 (fourteen) days. Every other Tuesday 09/23/22  Yes Iran Ouch, MD  ezetimibe (ZETIA) 10 MG tablet Take 1 tablet (10  mg total) by mouth daily. 04/16/23  Yes Azucena Fallen, MD  insulin glargine (LANTUS SOLOSTAR) 100 UNIT/ML Solostar Pen Inject 10 Units into the skin daily. Plus sliding scale, max dose 50 units a day 10/10/22  Yes Motwani, Komal, MD  spironolactone (ALDACTONE) 50 MG tablet Take 1 tablet (50 mg total) by mouth daily. 02/08/22  Yes Tower, Audrie Gallus, MD  thiamine 100 MG tablet Take 1 tablet (100 mg total) by mouth daily. 06/02/18  Yes Leroy Sea, MD  clotrimazole-betamethasone (LOTRISONE) cream Apply 1 Application topically daily. 01/24/23   Felecia Shelling, DPM  Continuous Glucose  Sensor (DEXCOM G7 SENSOR) MISC 1 Device by Does not apply route continuous. 10/10/22   Altamese [place], MD  gabapentin (NEURONTIN) 100 MG capsule Take 1 capsule (100 mg total) by mouth 3 (three) times daily as needed (nerve pain). 04/15/23   Azucena Fallen, MD  glucose blood [facility] VERIO) test strip 1 each by Other route daily. And lancets 1/day Patient not taking: Reported on 02/25/2023 01/26/21   Romero Belling, MD  Insulin Pen Needle 31G X 8 MM MISC Inject 1 Needle into the skin daily at 6 (six) AM. 12/27/22   Altamese Cullen, MD  Semaglutide,0.25 or 0.5MG /DOS, 2 MG/3ML SOPN Inject 0.25 mg into the skin once a week. Patient taking differently: Inject 0.25 mg into the skin every Monday. 10/10/22   Motwani, Carin Hock, MD  tadalafil (CIALIS) 20 MG tablet Take 1 tablet (20 mg total) by mouth daily as needed. 12/02/22       Social History   Socioeconomic History   Marital status: Married    Spouse name: Not on file   Number of children: 5   Years of education: Not on file   Highest education level: Not on file  Occupational History   Occupation: [employer]    Employer: [employer]   Occupation: [employer]  Tobacco Use   Smoking status: Light Smoker    Current packs/day: 0.25    Types: Cigarettes   Smokeless tobacco: Never  Vaping Use   Vaping status: Never Used  Substance and Sexual Activity   Alcohol use: Yes    Comment: 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 [employer]   Smoker, heavy drinking of Canadian mist alcohol in past   No drug use   Social Determinants of Health   Financial Resource Strain: Not on file  Food Insecurity: No Food Insecurity (04/12/2023)   Hunger Vital Sign    Worried About Running Out of Food in the Last Year: Never true    Ran Out of Food in the  Last Year: Never true  Transportation Needs: No Transportation Needs (04/12/2023)   PRAPARE - Administrator, Civil Service (Medical): No    Lack of Transportation (Non-Medical): No  Physical Activity: Not on file  Stress: Not on file  Social Connections: Not on file  Intimate Partner Violence: Not At Risk (04/12/2023)   Humiliation, Afraid, Rape, and Kick questionnaire    Fear of Current or Ex-Partner: No    Emotionally Abused: No    Physically Abused: No    Sexually Abused: No     Family History  Problem Relation Age of Onset   Hypertension  Other    Diabetes Neg Hx    Heart disease Neg Hx    Colon cancer Neg Hx    Stomach cancer Neg Hx    Pancreatic disease Neg Hx    Colon polyps Neg Hx    Esophageal cancer Neg Hx    Rectal cancer Neg Hx     ROS: per HPI   Physical Examination  Vitals:   04/18/23 0605 04/18/23 0606  BP: 120/80   Pulse: 95   Resp: 18   Temp:  97.9 F (36.6 C)  SpO2: 96%    Body mass index is 32.6 kg/m.  General:  WDWN in NAD Gait: Not observed HENT: WNL, normocephalic Pulmonary: normal non-labored breathing, without wheezing Cardiac: regula Abdomen: soft Vascular Exam/Pulses: 2+ femoral pulses bilaterally, 2+ right DP, no palpable distal pulses in left foot. Foot warm. Motor and sensation intact Left great toe with self avulsed nail and subungual ulceration. There is dry eschar present at tip of 1st toe and 5th toe. Some lateral plantar ulcers as well       Musculoskeletal: no muscle wasting or atrophy       Neurologic: A&O X 3;  No focal weakness or paresthesias are detected; speech is fluent/normal Psychiatric:  The pt has Normal affect.  CBC    Component Value Date/Time   WBC 7.1 04/18/2023 0544   RBC 4.69 04/18/2023 0544   HGB 15.8 04/18/2023 0544   HCT 46.5 04/18/2023 0544   PLT 266 04/18/2023 0544   MCV 99.1 04/18/2023 0544   MCH 33.7 04/18/2023 0544   MCHC 34.0 04/18/2023 0544   RDW 12.7 04/18/2023 0544    LYMPHSABS 1.5 04/14/2023 0421   MONOABS 0.7 04/14/2023 0421   EOSABS 0.5 04/14/2023 0421   BASOSABS 0.1 04/14/2023 0421    BMET    Component Value Date/Time   NA 134 (L) 04/18/2023 0544   K 4.0 04/18/2023 0544   CL 102 04/18/2023 0544   CO2 21 (L) 04/18/2023 0544   GLUCOSE 263 (H) 04/18/2023 0544   BUN 18 04/18/2023 0544   CREATININE 0.92 04/18/2023 0544   CALCIUM 9.6 04/18/2023 0544   GFRNONAA >60 04/18/2023 0544   GFRAA >60 06/01/2018 0427    COAGS: Lab Results  Component Value Date   INR 1.1 (H) 11/25/2022   INR 1.0 07/17/2018   INR 1.3 (H) 07/02/2018     Non-Invasive Vascular Imaging:   ABI Findings:  +---------+------------------+-----+----------------+--------+  Right   Rt Pressure (mmHg)IndexWaveform        Comment   +---------+------------------+-----+----------------+--------+  Brachial 176                    triphasic                 +---------+------------------+-----+----------------+--------+  PTA     144               0.80 brisk monophasic          +---------+------------------+-----+----------------+--------+  DP      130               0.73 brisk monophasic          +---------+------------------+-----+----------------+--------+  Great Toe136               0.76 Abnormal                  +---------+------------------+-----+----------------+--------+   +---------+------------------+-----+-------------------+-------+  Left    Lt Pressure (mmHg)IndexWaveform  Comment  +---------+------------------+-----+-------------------+-------+  Brachial 179                    triphasic                   +---------+------------------+-----+-------------------+-------+  PTA     0                 0.00 absent                      +---------+------------------+-----+-------------------+-------+  DP      54                0.30 dampened monophasic          +---------+------------------+-----+-------------------+-------+  Great Toe53                0.30 Abnormal                    +---------+------------------+-----+-------------------+-------+   +-------+-----------+-----------+------------+------------+  ABI/TBIToday's ABIToday's TBIPrevious ABIPrevious TBI  +-------+-----------+-----------+------------+------------+  Right 0.80       0.76       0.81        0.50          +-------+-----------+-----------+------------+------------+  Left  0.30       0.30       0.72        0.50          +-------+-----------+-----------+------------+------------+     RT Diameter  RT Findings         GSV            LT Diameter  LT  Findings      (cm)                                            (cm)                    +---------------+-----------+----------------------+---------------+-------  ----+      0.26                     Saphenofemoral         0.53                                                     Junction                                    +---------------+-----------+----------------------+---------------+-------  ----+      0.17                     Proximal thigh         0.53        branching   +---------------+-----------+----------------------+---------------+-------  ----+      0.19       branching       Mid thigh            0.37        branching   +---------------+-----------+----------------------+---------------+-------  ----+      0.12       branching      Distal thigh  0.25                    +---------------+-----------+----------------------+---------------+-------  ----+      0.06                          Knee              0.28        branching   +---------------+-----------+----------------------+---------------+-------  ----+      0.18       branching       Prox calf            0.19                     +---------------+-----------+----------------------+---------------+-------  ----+      0.17                        Mid calf            0.24        branching   +---------------+-----------+----------------------+---------------+-------  ----+      0.22                      Distal calf           0.20                    +---------------+-----------+----------------------+---------------+-------    ASSESSMENT/PLAN: This is a 62 y.o. 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  He has disease left SFA that frankly occludes with occluded popliteal and reconstituted peroneal artery.  He has marginal vein on recent vein mapping for bypass.  I have discussed with the patient and his wife we will attempt to use vein for bypass may need to be SFA to peroneal artery but with if there is sufficient vein would prefer left common femoral to peroneal artery bypass.  If no vein is sufficient this would need to be a PTFE bypass and will attempt vein patch angioplasty or composite vein bypass to the peroneal.  Either way the patient has quit smoking 7 days ago and I have encouraged him to continue this as any bypass will not be sufficient if he continues to smoke.  He demonstrates good understanding all questions were answered and consent was signed.  Charika Mikelson C. Randie Heinz, MD [facility] Office: [phone] Pager: [phone]

## 2023-04-18 NOTE — Anesthesia Postprocedure Evaluation (Signed)
Anesthesia Post Note  Patient: Zachary Lewis  Procedure(s) Performed: LEFT FEMORAL-PERONEAL ARTERY BYPASS (Left) VEIN HARVEST USING GREATER SAPHENOUS VEIN (Left: Leg Lower)     Patient location during evaluation: PACU Anesthesia Type: General Level of consciousness: awake and alert Pain management: pain level controlled Vital Signs Assessment: post-procedure vital signs reviewed and stable Respiratory status: spontaneous breathing, nonlabored ventilation, respiratory function stable and patient connected to nasal cannula oxygen Cardiovascular status: blood pressure returned to baseline and stable Postop Assessment: no apparent nausea or vomiting Anesthetic complications: no  No notable events documented.  Last Vitals:  Vitals:   04/18/23 1215 04/18/23 1230  BP: 102/70 114/70  Pulse: 90 89  Resp: 16 17  Temp:    SpO2: 99% 100%    Last Pain:  Vitals:   04/18/23 1230  PainSc: 6                  Emauri Krygier S

## 2023-04-18 NOTE — Anesthesia Procedure Notes (Signed)
Procedure Name: Intubation Date/Time: 04/18/2023 8:51 AM  Performed by: Hilda Lias, CRNAPre-anesthesia Checklist: Patient identified, Emergency Drugs available, Suction available and Patient being monitored Patient Re-evaluated:Patient Re-evaluated prior to induction Oxygen Delivery Method: Circle System Utilized Preoxygenation: Pre-oxygenation with 100% oxygen Induction Type: IV induction Ventilation: Mask ventilation without difficulty Laryngoscope Size: Mac and 3 Grade View: Grade I Tube type: Oral Tube size: 7.5 mm Number of attempts: 1 Airway Equipment and Method: Stylet and Oral airway Placement Confirmation: ETT inserted through vocal cords under direct vision, positive ETCO2 and breath sounds checked- equal and bilateral Secured at: 23 cm Tube secured with: Tape Dental Injury: Teeth and Oropharynx as per pre-operative assessment

## 2023-04-19 ENCOUNTER — Encounter (HOSPITAL_COMMUNITY): Payer: Self-pay | Admitting: Vascular Surgery

## 2023-04-19 DIAGNOSIS — Z95828 Presence of other vascular implants and grafts: Secondary | ICD-10-CM

## 2023-04-19 LAB — GLUCOSE, CAPILLARY
Glucose-Capillary: 289 mg/dL — ABNORMAL HIGH (ref 70–99)
Glucose-Capillary: 251 mg/dL — ABNORMAL HIGH (ref 70–99)
Glucose-Capillary: 207 mg/dL — ABNORMAL HIGH (ref 70–99)
Glucose-Capillary: 236 mg/dL — ABNORMAL HIGH (ref 70–99)

## 2023-04-19 LAB — CBC
HCT: 38.8 % — ABNORMAL LOW (ref 39.0–52.0)
Hemoglobin: 13.4 g/dL (ref 13.0–17.0)
MCH: 34.3 pg — ABNORMAL HIGH (ref 26.0–34.0)
MCHC: 34.5 g/dL (ref 30.0–36.0)
MCV: 99.2 fL (ref 80.0–100.0)
Platelets: 225 10*3/uL (ref 150–400)
RBC: 3.91 MIL/uL — ABNORMAL LOW (ref 4.22–5.81)
RDW: 12.7 % (ref 11.5–15.5)
WBC: 9.2 10*3/uL (ref 4.0–10.5)
nRBC: 0 % (ref 0.0–0.2)

## 2023-04-19 LAB — BASIC METABOLIC PANEL
Anion gap: 8 (ref 5–15)
BUN: 12 mg/dL (ref 8–23)
CO2: 22 mmol/L (ref 22–32)
Calcium: 8.9 mg/dL (ref 8.9–10.3)
Chloride: 101 mmol/L (ref 98–111)
Creatinine, Ser: 0.85 mg/dL (ref 0.61–1.24)
GFR, Estimated: >60 mL/min (ref >60)
Glucose, Bld: 277 mg/dL — ABNORMAL HIGH (ref 70–99)
Potassium: 4.3 mmol/L (ref 3.5–5.1)
Sodium: 131 mmol/L — ABNORMAL LOW (ref 135–145)

## 2023-04-19 LAB — LIPID PANEL
Cholesterol: 93 mg/dL (ref 0–200)
HDL: 40 mg/dL — ABNORMAL LOW (ref >40)
LDL Cholesterol: 38 mg/dL (ref 0–99)
Total CHOL/HDL Ratio: 2.3 {ratio}
Triglycerides: 73 mg/dL (ref <150)
VLDL: 15 mg/dL (ref 0–40)

## 2023-04-19 MED ORDER — NICOTINE 7 MG/24HR TD PT24
7.0000 mg | MEDICATED_PATCH | Freq: Every day | TRANSDERMAL | Status: DC
  Administered 2023-04-19 – 2023-04-21 (×3): 7 mg via TRANSDERMAL
  Filled 2023-04-19 (×3): qty 1

## 2023-04-19 MED ORDER — OXYCODONE HCL 5 MG PO TABS
5.0000 mg | ORAL_TABLET | ORAL | Status: DC | PRN
  Administered 2023-04-19: 5 mg via ORAL
  Administered 2023-04-19: 10 mg via ORAL
  Administered 2023-04-19: 5 mg via ORAL
  Administered 2023-04-20 – 2023-04-21 (×5): 10 mg via ORAL
  Filled 2023-04-19 (×2): qty 1
  Filled 2023-04-19 (×6): qty 2

## 2023-04-19 NOTE — Evaluation (Signed)
Physical Therapy Evaluation Patient Details Name: KYON FOOSHEE MRN: [id number] DOB: [birth year] Today's Date: 04/19/2023  History of Present Illness  Pt is a 62 y.o. male admitted 04/18/23 with worsening pain, wounds, loss of toenail and numbness in left foot, wound. X-rays of the left foot noted cortical deficit in discontinuity along the plantar aspect of the first digit concerning for possible fracture versus osteomyelitis. S/p left femoral-peroneal artery bypass (Left) vein harvest using greater saphenous vein (L lower leg) 11/29. PMH includes type II DM, HTN, HLD, Cirrhosis, tobacco use and PAD.  Clinical Impression  Pt is presenting below baseline level of functioning. Currently pt is Mod I for bed mobility,  Supervision/CGA for sit to stand and gait with RW. Pt is limited with functional mobility due to pain in the LLE secondary to surgery. Due to pt current functional status, home set up and available assistance at home no recommended skilled physical therapy services at this time. Will continue to follow in acute care hospital setting in order to ensure pt returns home with decreased risk for falls, injury, immobility and re-hospitalization.       If plan is discharge home, recommend the following: Assistance with cooking/housework;Assist for transportation;Help with stairs or ramp for entrance     Equipment Recommendations Rolling walker (2 wheels)     Functional Status Assessment Patient has had a recent decline in their functional status and demonstrates the ability to make significant improvements in function in a reasonable and predictable amount of time.     Precautions / Restrictions Precautions Precautions: Fall Restrictions Weight Bearing Restrictions: Yes LLE Weight Bearing: Weight bearing as tolerated      Mobility  Bed Mobility Overal bed mobility: Modified Independent     General bed mobility comments: Increased time, minimal use of bed rail.     Transfers Overall transfer level: Needs assistance Equipment used: Rolling walker (2 wheels) Transfers: Sit to/from Stand Sit to Stand: Supervision, Contact guard assist           General transfer comment: vc for safety with RW    Ambulation/Gait Ambulation/Gait assistance: Supervision, Contact guard assist Gait Distance (Feet): 120 Feet Assistive device: Rolling walker (2 wheels) Gait Pattern/deviations: Step-to pattern, Step-through pattern, Decreased stance time - left, Decreased step length - right, Antalgic Gait velocity: decreased Gait velocity interpretation: <1.31 ft/sec, indicative of household ambulator   General Gait Details: Flat foot initial contact with L foot with decreased knee flexion throughout movement with cueing slight improvement. Pt was educated how to perform gait with decreased WB through LLE and educated on correct height on RW. Verbal cues for sequencing with RW.  Stairs Stairs:  (Educated pt for up with the good leg down with the bad leg to navigate stairs per home set up.)             Balance Overall balance assessment: Mild deficits observed, not formally tested         Pertinent Vitals/Pain Pain Assessment Pain Assessment: Faces Faces Pain Scale: Hurts even more Pain Location: LLE Pain Descriptors / Indicators: Discomfort, Grimacing, Guarding, Operative site guarding Pain Intervention(s): Monitored during session    Home Living Family/patient expects to be discharged to:: Private residence Living Arrangements: Spouse/significant other Available Help at Discharge: Family;Available 24 hours/day Type of Home: House Home Access: Stairs to enter Entrance Stairs-Rails: Right;Left;Can reach both Entrance Stairs-Number of Steps: 4 Alternate Level Stairs-Number of Steps: flight Home Layout: Multi-level;1/2 bath on main level Home Equipment: Rollator (4 wheels) Additional Comments: plans  on "camping out" downstairs initially and using  half bath. 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 Likes the redskins and the [place]    Prior Function Prior Level of Function : Independent/Modified Independent;Driving;Working/employed         Extremity/Trunk Assessment   Upper Extremity Assessment Upper Extremity Assessment: Defer to OT evaluation    Lower Extremity Assessment Lower Extremity Assessment: Overall WFL for tasks assessed;LLE deficits/detail LLE Deficits / Details: post-op deficits as anticipated LLE Coordination: decreased gross motor    Cervical / Trunk Assessment Cervical / Trunk Assessment: Normal  Communication   Communication Communication: No apparent difficulties  Cognition Arousal: Alert Behavior During Therapy: WFL for tasks assessed/performed Overall Cognitive Status: Within Functional Limits for tasks assessed       General Comments General comments (skin integrity, edema, etc.): Daughter present throughout session; very supportive. No signiticant deviations in HR throughout functional mobility.        Assessment/Plan    PT Assessment Patient needs continued PT services  PT Problem List Decreased mobility;Decreased activity tolerance;Pain;Decreased range of motion       PT Treatment Interventions DME instruction;Therapeutic exercise;Gait training;Balance training;Stair training;Functional mobility training;Therapeutic activities;Patient/family education    PT Goals (Current goals can be found in the Care Plan section)  Acute Rehab PT Goals Patient Stated Goal: to return home and decrease pain PT Goal Formulation: With patient Time For Goal Achievement: 05/03/23 Potential to Achieve Goals: Good    Frequency Min 1X/week        AM-PAC PT "6 Clicks" Mobility  Outcome Measure Help needed turning from your back to your side while in a flat bed without using bedrails?: None Help needed moving from lying on your back to sitting on the side of a flat bed without using bedrails?:  None Help needed moving to and from a bed to a chair (including a wheelchair)?: A Little Help needed standing up from a chair using your arms (e.g., wheelchair or bedside chair)?: A Little Help needed to walk in hospital room?: A Little Help needed climbing 3-5 steps with a railing? : A Little 6 Click Score: 20    End of Session   Activity Tolerance: Patient tolerated treatment well Patient left: in bed;with call bell/phone within reach;with family/visitor present Nurse Communication: Mobility status PT Visit Diagnosis: Other abnormalities of gait and mobility (R26.89)    Time: 1335-1350 PT Time Calculation (min) (ACUTE ONLY): 15 min   Charges:   PT Evaluation $PT Eval Low Complexity: 1 Low   PT General Charges $$ ACUTE PT VISIT: 1 Visit        Harrel Carina, DPT, CLT  Acute Rehabilitation Services Office: [phone] (Secure chat preferred)   Claudia Desanctis 04/19/2023, 2:01 PM

## 2023-04-19 NOTE — Evaluation (Signed)
Occupational Therapy Evaluation Patient Details Name: Zachary Lewis MRN: [id number] DOB: 07/06/1960 Today's Date: 04/19/2023   History of Present Illness Pt is a 62 y.o. male admitted 04/18/23 with worsening pain, wounds, loss of toenail and numbness in left foot, wound. X-rays of the left foot noted cortical deficit in discontinuity along the plantar aspect of the first digit concerning for possible fracture versus osteomyelitis. S/p left femoral-peroneal artery bypass (Left) vein harvest using greater saphenous vein (L lower leg) 11/29. PMH includes type II DM, HTN, HLD, Cirrhosis, tobacco use and PAD.   Clinical Impression   Pt is typically independent in ADL and mobility. Today he is supervision/CGA with RW and performing transfers at supervision level with cues for safe hand placement. Mod A for LB ADL without DME and wife is willing and able to assist. Educated in AE kit (long handle sponge, grabber/reacher, sock donner, long handle shoe horn) and 3 in 1 for uses as a toilet riser, BSC, and shower chair. Also reviewed tub transfer utilizing wall for stability. Pt will benefit from continued skilled OT in the acute setting while hospitalized but do not anticipate the need for post-acute OT at this time.        If plan is discharge home, recommend the following: A little help with walking and/or transfers;A little help with bathing/dressing/bathroom;Assistance with cooking/housework;Assist for transportation;Help with stairs or ramp for entrance    Functional Status Assessment  Patient has had a recent decline in their functional status and demonstrates the ability to make significant improvements in function in a reasonable and predictable amount of time.  Equipment Recommendations  BSC/3in1;Other (comment) (AE "hip" kit)    Recommendations for Other Services PT consult     Precautions / Restrictions Restrictions Weight Bearing Restrictions: Yes LLE Weight Bearing: Weight bearing  as tolerated      Mobility Bed Mobility               General bed mobility comments: OOB at beginning and end of session    Transfers Overall transfer level: Needs assistance Equipment used: Rolling walker (2 wheels) Transfers: Sit to/from Stand Sit to Stand: Supervision, Contact guard assist           General transfer comment: vc for safety with RW      Balance Overall balance assessment: Mild deficits observed, not formally tested                                         ADL either performed or assessed with clinical judgement   ADL Overall ADL's : Needs assistance/impaired Eating/Feeding: Independent   Grooming: Wash/dry hands;Oral care;Standing;Supervision/safety Grooming Details (indicate cue type and reason): painful LLE but able to maintain for standing grooming Upper Body Bathing: Set up;Sitting   Lower Body Bathing: Set up;Sitting/lateral leans;With adaptive equipment Lower Body Bathing Details (indicate cue type and reason): educated on long handle sponge and 3 in 1 as shower chair Upper Body Dressing : Modified independent;Sitting   Lower Body Dressing: Moderate assistance;Contact guard assist;With adaptive equipment;With caregiver independent assisting;Sit to/from stand Lower Body Dressing Details (indicate cue type and reason): educated in long handle shoe horn, grabber/reacher, and sock donner Toilet Transfer: Contact guard assist;Ambulation;Rolling walker (2 wheels)   Toileting- Clothing Manipulation and Hygiene: Supervision/safety;Sit to/from stand   Tub/ Shower Transfer: Tub transfer;Contact guard Financial risk analyst Details (indicate cue type and reason): educated on sequence and  using wall as support Functional mobility during ADLs: Contact guard assist;Rolling walker (2 wheels) General ADL Comments: decreased ROM on LLE, impacting LB ADL. educated on AE. wife and daughter present     Vision Ability to See  in Adequate Light: 0 Adequate Patient Visual Report: No change from baseline Vision Assessment?: No apparent visual deficits     Perception Perception: Within Functional Limits       Praxis Praxis: WFL       Pertinent Vitals/Pain Pain Assessment Pain Assessment: Faces Faces Pain Scale: Hurts even more Pain Location: LLE Pain Descriptors / Indicators: Discomfort, Grimacing, Guarding, Operative site guarding Pain Intervention(s): Monitored during session, Repositioned     Extremity/Trunk Assessment Upper Extremity Assessment Upper Extremity Assessment: Overall WFL for tasks assessed   Lower Extremity Assessment Lower Extremity Assessment: LLE deficits/detail;Defer to PT evaluation LLE Deficits / Details: opst-op deficits as anticipated LLE Coordination: decreased gross motor   Cervical / Trunk Assessment Cervical / Trunk Assessment: Normal   Communication Communication Communication: No apparent difficulties   Cognition Arousal: Alert Behavior During Therapy: WFL for tasks assessed/performed Overall Cognitive Status: Within Functional Limits for tasks assessed                                       General Comments  wife and daughter present throughout and very supportive    Exercises     Shoulder Instructions      Home Living Family/patient expects to be discharged to:: Private residence Living Arrangements: Spouse/significant other Available Help at Discharge: Family;Available 24 hours/day Type of Home: House Home Access: Stairs to enter [employer] of Steps: 4 Entrance Stairs-Rails: Right;Left;Can reach both Home Layout: Multi-level;1/2 bath on main level Alternate Level Stairs-Number of Steps: flight   Bathroom Shower/Tub: Tub/shower unit;Curtain   Firefighter: Standard     Home Equipment: Rollator (4 wheels)   Additional Comments: plans on "camping out" downstairs initially and using half bath. 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 Likes the redskins and the Dodgers      Prior Functioning/Environment Prior Level of Function : Independent/Modified Independent;Driving;Working/employed                        OT Problem List: Decreased range of motion;Decreased knowledge of use of DME or AE;Pain      OT Treatment/Interventions: Self-care/ADL training;Energy conservation;DME and/or AE instruction;Therapeutic activities;Patient/family education;Balance training    OT Goals(Current goals can be found in the care plan section) Acute Rehab OT Goals Patient Stated Goal: decrease pain in LLE, get comfortable OT Goal Formulation: With patient/family Time For Goal Achievement: 05/03/23 Potential to Achieve Goals: Good ADL Goals Pt Will Perform Grooming: with modified independence;standing Pt Will Perform Upper Body Dressing: with modified independence;sitting Pt Will Perform Lower Body Dressing: with modified independence;with adaptive equipment;sit to/from stand Pt Will Transfer to Toilet: with modified independence;ambulating Pt Will Perform Toileting - Clothing Manipulation and hygiene: with modified independence;sitting/lateral leans;sit to/from stand  OT Frequency: Min 1X/week    Co-evaluation              AM-PAC OT "6 Clicks" Daily Activity     Outcome Measure Help from another person eating meals?: None Help from another person taking care of personal grooming?: A Little Help from another person toileting, which includes using toliet, bedpan, or urinal?: A Little Help from another person bathing (including washing,  rinsing, drying)?: A Little Help from another person to put on and taking off regular upper body clothing?: A Little Help from another person to put on and taking off regular lower body clothing?: A Lot 6 Click Score: 18   End of Session Equipment Utilized During Treatment: Rolling walker (2 wheels) Nurse Communication: Mobility status (no chair  alarm)  Activity Tolerance: Patient tolerated treatment well Patient left: in chair;with call bell/phone within reach;with family/visitor present  OT Visit Diagnosis: Muscle weakness (generalized) (M62.81);Other abnormalities of gait and mobility (R26.89);Pain Pain - Right/Left: Left Pain - part of body: Leg                Time: 8657-8469 OT Time Calculation (min): 33 min Charges:  OT General Charges $OT Visit: 1 Visit OT Evaluation $OT Eval Moderate Complexity: 1 Mod OT Treatments $Self Care/Home Management : 8-22 mins  Nyoka Cowden OTR/L Acute Rehabilitation Services Office: [phone]  Evern Bio [facility] 04/19/2023, 1:50 PM

## 2023-04-19 NOTE — Progress Notes (Addendum)
  Progress Note    04/19/2023 8:19 AM 1 Day Post-Op  Subjective:  feels fine this morning. Says his left leg is a little sore    Vitals:   04/19/23 0700 04/19/23 0737  BP:  (!) 125/90  Pulse: 92 (!) 112  Resp:  20  Temp:  98.2 F (36.8 C)  SpO2: 98% 99%    Physical Exam: General:  sitting up in bed, alert and oriented Lungs:  nonlabored Incisions:  LLE incisions c/d/l without hematoma  Extremities:  brisk left peroneal doppler signal. L GT gangrene dry  CBC    Component Value Date/Time   WBC 9.2 04/19/2023 0245   RBC 3.91 (L) 04/19/2023 0245   HGB 13.4 04/19/2023 0245   HCT 38.8 (L) 04/19/2023 0245   PLT 225 04/19/2023 0245   MCV 99.2 04/19/2023 0245   MCH 34.3 (H) 04/19/2023 0245   MCHC 34.5 04/19/2023 0245   RDW 12.7 04/19/2023 0245   LYMPHSABS 1.5 04/14/2023 0421   MONOABS 0.7 04/14/2023 0421   EOSABS 0.5 04/14/2023 0421   BASOSABS 0.1 04/14/2023 0421    BMET    Component Value Date/Time   NA 131 (L) 04/19/2023 0245   K 4.3 04/19/2023 0245   CL 101 04/19/2023 0245   CO2 22 04/19/2023 0245   GLUCOSE 277 (H) 04/19/2023 0245   BUN 12 04/19/2023 0245   CREATININE 0.85 04/19/2023 0245   CALCIUM 8.9 04/19/2023 0245   GFRNONAA >60 04/19/2023 0245   GFRAA >60 06/01/2018 0427    INR    Component Value Date/Time   INR 1.1 (H) 11/25/2022 1152     Intake/Output Summary (Last 24 hours) at 04/19/2023 0819 Last data filed at 04/19/2023 0748 Gross per 24 hour  Intake 1500 ml  Output 1850 ml  Net -350 ml      Assessment/Plan:  62 y.o. male is 1 day post op, s/p:  L SFA-peroneal bypass with ipsilateral GSV   -No issues overnight. No complaints this morning other than some left leg soreness -L groin and LLE incisions well appearing and dry. 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 Toe remains dry. Will allow to demarcate. Pending outpatient podiatry follow up -Hemodynamically stable.  Hemoglobin at 13.4 this morning -Okay to get OOB with PT/OT today. Has not ambulated yet -Continue augmentin for left GT until 12/6   Loel Dubonnet, PA-C [facility] [phone] 04/19/2023 8:19 AM  I have seen and evaluated the patient. I agree with the PA note as documented above.  Postop day 1 status post left SFA to peroneal bypass.  All of his incisions look great.  He has a brisk peroneal signal at the ankle.  Will allow toe to demarcate.  Increased his oxycodone to 1 to 2 tablets every 4 hours as pain control has been an issue this morning.  Discussed out of bed and mobilize today.  Cephus Shelling, MD [facility] [place] Office: [phone]

## 2023-04-19 NOTE — Progress Notes (Signed)
PHARMACIST LIPID MONITORING  Zachary Lewis is a 62 y.o. male admitted on 04/18/2023 with L SFA-peroneal bypass with ipsilateral GSV.  Pharmacy has been consulted to optimize lipid-lowering therapy with the indication of secondary prevention for clinical ASCVD.  Recent Labs:  Lipid Panel (last 6 months):   Lab Results  Component Value Date   CHOL 93 04/19/2023   TRIG 73 04/19/2023   HDL 40 (L) 04/19/2023   CHOLHDL 2.3 04/19/2023   VLDL 15 04/19/2023   LDLCALC 38 04/19/2023    Hepatic function panel (last 6 months):   Lab Results  Component Value Date   AST 55 (H) 04/18/2023   ALT 80 (H) 04/18/2023   ALKPHOS 164 (H) 04/18/2023   BILITOT 0.6 04/18/2023   BILIDIR 0.1 04/12/2023   IBILI 0.5 04/12/2023    SCr (since admission):   Serum creatinine: 0.85 mg/dL 72/53/66 4403 Estimated creatinine clearance: 94.2 mL/min  Current therapy and lipid therapy tolerance Current lipid-lowering therapy: Repatha and Zetia (not yet started)  Documented or reported allergies or intolerances to lipid-lowering therapies (if applicable): none  Assessment:   Zachary Lewis is a pleasant 62 year old male status post L SFA-peroneal bypass with ipsilateral GSV. He is on repatha and zetia (new) and his most recent LDL is 38. Patient and his wife report missing no doses of repatha and no trouble obtaining the medication. No therapy changes indicated at this time. Counseled patient and his wife on the importance of maintaining a healthy cholesterol. All questions answered.   Plan:    1.Statin intensity (high intensity recommended for all patients regardless of the LDL):  Statin intolerance noted. No statin changes due to serious side effects (ex. Myalgias with at least 2 different statins).  2.Add ezetimibe (if any one of the following):   Cannot tolerate statin at any dose., Zetia added 11/26 and has started taking with no issues  3.Refer to lipid clinic:   No  4.Follow-up with:  Primary care provider -  Tower, Audrie Gallus, MD  5.Follow-up labs after discharge:  No changes in lipid therapy, repeat a lipid panel in one year.     Blane Ohara, PharmD, BCPS PGY2 Pharmacy Resident

## 2023-04-20 ENCOUNTER — Other Ambulatory Visit: Payer: Self-pay | Admitting: Family Medicine

## 2023-04-20 LAB — BASIC METABOLIC PANEL
Anion gap: 10 (ref 5–15)
BUN: 16 mg/dL (ref 8–23)
CO2: 21 mmol/L — ABNORMAL LOW (ref 22–32)
Calcium: 8.8 mg/dL — ABNORMAL LOW (ref 8.9–10.3)
Chloride: 101 mmol/L (ref 98–111)
Creatinine, Ser: 0.93 mg/dL (ref 0.61–1.24)
GFR, Estimated: >60 mL/min (ref >60)
Glucose, Bld: 243 mg/dL — ABNORMAL HIGH (ref 70–99)
Potassium: 3.9 mmol/L (ref 3.5–5.1)
Sodium: 132 mmol/L — ABNORMAL LOW (ref 135–145)

## 2023-04-20 LAB — CBC
HCT: 35.8 % — ABNORMAL LOW (ref 39.0–52.0)
Hemoglobin: 12.3 g/dL — ABNORMAL LOW (ref 13.0–17.0)
MCH: 33.4 pg (ref 26.0–34.0)
MCHC: 34.4 g/dL (ref 30.0–36.0)
MCV: 97.3 fL (ref 80.0–100.0)
Platelets: 223 10*3/uL (ref 150–400)
RBC: 3.68 MIL/uL — ABNORMAL LOW (ref 4.22–5.81)
RDW: 12.6 % (ref 11.5–15.5)
WBC: 8.6 10*3/uL (ref 4.0–10.5)
nRBC: 0 % (ref 0.0–0.2)

## 2023-04-20 LAB — GLUCOSE, CAPILLARY
Glucose-Capillary: 241 mg/dL — ABNORMAL HIGH (ref 70–99)
Glucose-Capillary: 265 mg/dL — ABNORMAL HIGH (ref 70–99)
Glucose-Capillary: 208 mg/dL — ABNORMAL HIGH (ref 70–99)
Glucose-Capillary: 227 mg/dL — ABNORMAL HIGH (ref 70–99)

## 2023-04-20 NOTE — Plan of Care (Signed)
  Problem: Education: Goal: Ability to describe self-care measures that may prevent or decrease complications (Diabetes Survival Skills Education) will improve Outcome: Progressing   Problem: Coping: Goal: Ability to adjust to condition or change in health will improve Outcome: Progressing   Problem: Fluid Volume: Goal: Ability to maintain a balanced intake and output will improve Outcome: Progressing   Problem: Metabolic: Goal: Ability to maintain appropriate glucose levels will improve Outcome: Progressing   Problem: Nutritional: Goal: Maintenance of adequate nutrition will improve Outcome: Progressing Goal: Progress toward achieving an optimal weight will improve Outcome: Progressing   Problem: Skin Integrity: Goal: Risk for impaired skin integrity will decrease Outcome: Progressing   Problem: Tissue Perfusion: Goal: Adequacy of tissue perfusion will improve Outcome: Progressing

## 2023-04-20 NOTE — Progress Notes (Signed)
[facility] [place]  Subjective  -still having pain when moving and walking   Objective (!) 130/94 (!) 105 98.2 F (36.8 C) (Oral) 18 100%  Intake/Output Summary (Last 24 hours) at 04/20/2023 0959 Last data filed at 04/20/2023 0800 Gross per 24 hour  Intake 240 ml  Output 500 ml  Net -260 ml   Left leg incisions clean dry and intact without hematoma Left peroneal signal brisk at the ankle  Laboratory Lab Results: Recent Labs    04/19/23 0245 04/20/23 0340  WBC 9.2 8.6  HGB 13.4 12.3*  HCT 38.8* 35.8*  PLT 225 223   BMET Recent Labs    04/19/23 0245 04/20/23 0340  NA 131* 132*  K 4.3 3.9  CL 101 101  CO2 22 21*  GLUCOSE 277* 243*  BUN 12 16  CREATININE 0.85 0.93  CALCIUM 8.9 8.8*    COAG Lab Results  Component Value Date   INR 1.1 (H) 11/25/2022   INR 1.0 07/17/2018   INR 1.3 (H) 07/02/2018   No results found for: "PTT"  Assessment/Planning:  Postop day 2 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  Still has a brisk signal.  Needs to continue to work on mobility as pain is an ongoing issue.  We did increase his oxycodone to 2 tablets every 4 hours yesterday.  Otherwise looks good.  Hopefully home tomorrow.  Cephus Shelling 04/20/2023 9:59 AM --

## 2023-04-21 ENCOUNTER — Other Ambulatory Visit (HOSPITAL_COMMUNITY): Payer: Self-pay

## 2023-04-21 ENCOUNTER — Encounter: Payer: Self-pay | Admitting: Vascular Surgery

## 2023-04-21 LAB — CBC
HCT: 38.5 % — ABNORMAL LOW (ref 39.0–52.0)
Hemoglobin: 13.1 g/dL (ref 13.0–17.0)
MCH: 34.3 pg — ABNORMAL HIGH (ref 26.0–34.0)
MCHC: 34 g/dL (ref 30.0–36.0)
MCV: 100.8 fL — ABNORMAL HIGH (ref 80.0–100.0)
Platelets: 282 10*3/uL (ref 150–400)
RBC: 3.82 MIL/uL — ABNORMAL LOW (ref 4.22–5.81)
RDW: 12.6 % (ref 11.5–15.5)
WBC: 9.4 10*3/uL (ref 4.0–10.5)
nRBC: 0 % (ref 0.0–0.2)

## 2023-04-21 LAB — BASIC METABOLIC PANEL WITH GFR
Anion gap: 12 (ref 5–15)
BUN: 16 mg/dL (ref 8–23)
CO2: 23 mmol/L (ref 22–32)
Calcium: 9.3 mg/dL (ref 8.9–10.3)
Chloride: 101 mmol/L (ref 98–111)
Creatinine, Ser: 0.9 mg/dL (ref 0.61–1.24)
GFR, Estimated: >60 mL/min
Glucose, Bld: 250 mg/dL — ABNORMAL HIGH (ref 70–99)
Potassium: 4.1 mmol/L (ref 3.5–5.1)
Sodium: 136 mmol/L (ref 135–145)

## 2023-04-21 LAB — GLUCOSE, CAPILLARY: Glucose-Capillary: 217 mg/dL — ABNORMAL HIGH (ref 70–99)

## 2023-04-21 MED ORDER — OXYCODONE HCL 5 MG PO TABS
5.0000 mg | ORAL_TABLET | Freq: Four times a day (QID) | ORAL | 0 refills | Status: DC | PRN
  Filled 2023-04-21: qty 30, 7d supply, fill #0

## 2023-04-21 NOTE — Discharge Instructions (Signed)
 [facility] [place]  Discharge instructions  Lower Extremity Bypass Surgery  Please refer to the following instruction for your post-procedure care. Your surgeon or physician assistant will discuss any changes with you.  Activity  You are encouraged to walk as much as you can. You can slowly return to normal activities during the month after your surgery. Avoid strenuous activity and heavy lifting until your doctor tells you it's OK. Avoid activities such as vacuuming or swinging a golf club. Do not drive until your doctor give the OK and you are no longer taking prescription pain medications. It is also normal to have difficulty with sleep habits, eating and bowel movement after surgery. These will go away with time.  Bathing/Showering  You may shower after you go home. Do not soak in a bathtub, hot tub, or swim until the incision heals completely.  Incision Care  Clean your incision with mild soap and water. Shower every day. Pat the area dry with a clean towel. You do not need a bandage unless otherwise instructed. Do not apply any ointments or creams to your incision. If you have open wounds you will be instructed how to care for them or a visiting nurse may be arranged for you. If you have staples or sutures along your incision they will be removed at your post-op appointment. You may have skin glue on your incision. Do not peel it off. It will come off on its own in about one week. If you have a great deal of moisture in your groin, use a gauze help keep this area dry.  Diet  Resume your normal diet. There are no special food restrictions following this procedure. A low fat/ low cholesterol diet is recommended for all patients with vascular disease. In order to heal from your surgery, it is CRITICAL to get adequate nutrition. Your body requires vitamins, minerals, and protein. Vegetables are the best source of vitamins and minerals. Vegetables also provide the  perfect balance of protein. Processed food has little nutritional value, so try to avoid this.  Medications  Resume taking all your medications unless your doctor or nurse practitioner tells you not to. If your incision is causing pain, you may take over-the-counter pain relievers such as acetaminophen (Tylenol). If you were prescribed a stronger pain medication, please aware these medication can cause nausea and constipation. Prevent nausea by taking the medication with a snack or meal. Avoid constipation by drinking plenty of fluids and eating foods with high amount of fiber, such as fruits, vegetables, and grains. Take Colase 100 mg (an over-the-counter stool softener) twice a day as needed for constipation. Do not take Tylenol if you are taking prescription pain medications.  Follow Up  Our office will schedule a follow up appointment 2-3 weeks following discharge.  Please call us immediately for any of the following conditions  Severe or worsening pain in your legs or feet while at rest or while walking Increase pain, redness, warmth, or drainage (pus) from your incision site(s) Fever of 101 degree or higher The swelling in your leg with the bypass suddenly worsens and becomes more painful than when you were in the hospital If you have been instructed to feel your graft pulse then you should do so every day. If you can no longer feel this pulse, call the office immediately. Not all patients are given this instruction.  Leg swelling is common after leg bypass surgery.  The swelling should improve over a few months   following surgery. To improve the swelling, you may elevate your legs above the level of your heart while you are sitting or resting. Your surgeon or physician assistant may ask you to apply an ACE wrap or wear compression (TED) stockings to help to reduce swelling.  Reduce your risk of vascular disease  Stop smoking. If you would like help call [facility] at 1-800-QUIT-NOW  (1-800-784-8669) or [place] at [phone].  Manage your cholesterol Maintain a desired weight Control your diabetes weight Control your diabetes Keep your blood pressure down  If you have any questions, please call the office at [phone]   

## 2023-04-21 NOTE — Plan of Care (Signed)
  Problem: Education: Goal: Ability to describe self-care measures that may prevent or decrease complications (Diabetes Survival Skills Education) will improve Outcome: Progressing Goal: Individualized Educational Video(s) Outcome: Progressing   Problem: Coping: Goal: Ability to adjust to condition or change in health will improve Outcome: Progressing   

## 2023-04-21 NOTE — Plan of Care (Signed)
  Problem: Education: Goal: Ability to describe self-care measures that may prevent or decrease complications (Diabetes Survival Skills Education) will improve Outcome: Adequate for Discharge Goal: Individualized Educational Video(s) Outcome: Adequate for Discharge   Problem: Coping: Goal: Ability to adjust to condition or change in health will improve Outcome: Adequate for Discharge   Problem: Fluid Volume: Goal: Ability to maintain a balanced intake and output will improve Outcome: Adequate for Discharge   Problem: Health Behavior/Discharge Planning: Goal: Ability to identify and utilize available resources and services will improve Outcome: Adequate for Discharge Goal: Ability to manage health-related needs will improve Outcome: Adequate for Discharge   Problem: Metabolic: Goal: Ability to maintain appropriate glucose levels will improve Outcome: Adequate for Discharge   Problem: Nutritional: Goal: Maintenance of adequate nutrition will improve Outcome: Adequate for Discharge Goal: Progress toward achieving an optimal weight will improve Outcome: Adequate for Discharge   Problem: Skin Integrity: Goal: Risk for impaired skin integrity will decrease Outcome: Adequate for Discharge   Problem: Tissue Perfusion: Goal: Adequacy of tissue perfusion will improve Outcome: Adequate for Discharge   Problem: Education: Goal: Knowledge of prescribed regimen will improve Outcome: Adequate for Discharge   Problem: Activity: Goal: Ability to tolerate increased activity will improve Outcome: Adequate for Discharge   Problem: Bowel/Gastric: Goal: Gastrointestinal status for postoperative course will improve Outcome: Adequate for Discharge   Problem: Clinical Measurements: Goal: Postoperative complications will be avoided or minimized Outcome: Adequate for Discharge Goal: Signs and symptoms of graft occlusion will improve Outcome: Adequate for Discharge   Problem: Skin  Integrity: Goal: Demonstration of wound healing without infection will improve Outcome: Adequate for Discharge   Problem: Education: Goal: Knowledge of General Education information will improve Description: Including pain rating scale, medication(s)/side effects and non-pharmacologic comfort measures Outcome: Adequate for Discharge   Problem: Health Behavior/Discharge Planning: Goal: Ability to manage health-related needs will improve Outcome: Adequate for Discharge   Problem: Clinical Measurements: Goal: Ability to maintain clinical measurements within normal limits will improve Outcome: Adequate for Discharge Goal: Will remain free from infection Outcome: Adequate for Discharge Goal: Diagnostic test results will improve Outcome: Adequate for Discharge Goal: Respiratory complications will improve Outcome: Adequate for Discharge Goal: Cardiovascular complication will be avoided Outcome: Adequate for Discharge   Problem: Activity: Goal: Risk for activity intolerance will decrease Outcome: Adequate for Discharge   Problem: Nutrition: Goal: Adequate nutrition will be maintained Outcome: Adequate for Discharge   Problem: Coping: Goal: Level of anxiety will decrease Outcome: Adequate for Discharge   Problem: Elimination: Goal: Will not experience complications related to bowel motility Outcome: Adequate for Discharge Goal: Will not experience complications related to urinary retention Outcome: Adequate for Discharge   Problem: Pain Management: Goal: General experience of comfort will improve Outcome: Adequate for Discharge   Problem: Safety: Goal: Ability to remain free from injury will improve Outcome: Adequate for Discharge   Problem: Skin Integrity: Goal: Risk for impaired skin integrity will decrease Outcome: Adequate for Discharge   Problem: Acute Rehab OT Goals (only OT should resolve) Goal: Pt. Will Perform Grooming Outcome: Adequate for Discharge Goal:  Pt. Will Perform Upper Body Dressing Outcome: Adequate for Discharge Goal: Pt. Will Perform Lower Body Dressing Outcome: Adequate for Discharge Goal: Pt. Will Transfer To Toilet Outcome: Adequate for Discharge Goal: Pt. Will Perform Toileting-Clothing Manipulation Outcome: Adequate for Discharge

## 2023-04-21 NOTE — Progress Notes (Addendum)
[facility]  Subjective  - ready to go home   Objective 127/76 88 98.3 F (36.8 C) (Oral) 18 99%  Intake/Output Summary (Last 24 hours) at 04/21/2023 1610 Last data filed at 04/20/2023 0800 Gross per 24 hour  Intake 240 ml  Output --  Net 240 ml    Peroneal doppler signal intact, incisions are healing well Groin soft without hematoma Left GT dry gangrene without change no drainage    Assessment/Planning: POD # 2 62 y.o. male is 1 day post op, s/p:  L SFA-peroneal bypass with ipsilateral GSV   Peroneal signal intact Plan for discharge home with pain medication No home Continue augmentin for left GT until 12/6.  Will allow GT to demarcate.  F/U with Dr. Dorcas Mcmurray 04/21/2023 7:23 AM --  Laboratory Lab Results: Recent Labs    04/19/23 0245 04/20/23 0340  WBC 9.2 8.6  HGB 13.4 12.3*  HCT 38.8* 35.8*  PLT 225 223   BMET Recent Labs    04/19/23 0245 04/20/23 0340  NA 131* 132*  K 4.3 3.9  CL 101 101  CO2 22 21*  GLUCOSE 277* 243*  BUN 12 16  CREATININE 0.85 0.93  CALCIUM 8.9 8.8*    COAG Lab Results  Component Value Date   INR 1.1 (H) 11/25/2022   INR 1.0 07/17/2018   INR 1.3 (H) 07/02/2018   No results found for: "PTT"  I have independently interviewed and examined patient and agree with PA assessment and plan above.  Strong signal left peroneal artery with warm left foot stable left great toe ulceration follow-up with podiatry.  Thankfully patient has quit smoking hopefully will continue smoking cessation.  He will follow-up in a few weeks for wound checks.  Rexton Greulich C. Randie Heinz, MD [facility] Office: [phone] Pager: [phone]

## 2023-04-21 NOTE — TOC Transition Note (Signed)
Transition of Care (TOC) - CM/SW Discharge Note Donn Pierini RN, BSN Transitions of Care Unit 4E- RN Case Manager See Treatment Team for direct phone #   Patient Details  Name: Zachary Lewis MRN: [id number] Date of Birth: 05/19/1961  Transition of Care [facility]) CM/SW Contact:  Darrold Span, RN Phone Number: 04/21/2023, 12:22 PM   Clinical Narrative:    Pt stable for transition home today, bedside RN notified CM that pt requesting RW- noted recommendation per PT note.  Order placed- pt has not preference for DME provider- wife to transport home around 11am.   Call made to [employer] for DME request- RW to be delivered to room prior to discharge.   No further TOC needs noted.    Final next level of care: Home/Self Care Barriers to Discharge: No Barriers Identified   Patient Goals and CMS Choice   Choice offered to / list presented to : Patient  Discharge Placement                 Home        Discharge Plan and Services Additional resources added to the After Visit Summary for     Discharge Planning Services: CM Consult Post Acute Care Choice: Durable Medical Equipment          DME Arranged: Dan Humphreys rolling DME Agency: [employer] Date DME Agency Contacted: 04/21/23 Time DME Agency Contacted: 1000 Representative spoke with at DME Agency: [employer] HH Arranged: NA HH Agency: NA        Social Determinants of Health (SDOH) Interventions SDOH Screenings   Food Insecurity: No Food Insecurity (04/18/2023)  Housing: Patient Declined (04/18/2023)  Transportation Needs: No Transportation Needs (04/18/2023)  Utilities: Not At Risk (04/18/2023)  Depression (PHQ2-9): Low Risk  (02/08/2022)  Tobacco Use: High Risk (04/18/2023)     Readmission Risk Interventions    04/14/2023    5:27 PM  Readmission Risk Prevention Plan  Post Dischage Appt Complete  Medication Screening Complete  Transportation Screening Complete

## 2023-04-21 NOTE — Progress Notes (Signed)
Physical Therapy Treatment and Discharge Patient Details Name: Zachary Lewis MRN: [id number] DOB: 06-Jun-1960 Today's Date: 04/21/2023   History of Present Illness Pt is a 62 y.o. male admitted 04/18/23 with worsening pain, wounds, loss of toenail and numbness in left foot, wound. X-rays of the left foot noted cortical deficit in discontinuity along the plantar aspect of the first digit concerning for possible fracture versus osteomyelitis. S/p left femoral-peroneal artery bypass (Left) vein harvest using greater saphenous vein (L lower leg) 11/29. PMH includes type II DM, HTN, HLD, Cirrhosis, tobacco use and PAD.    PT Comments  Pt planning to go home later today. Reviewed activity level for return home and elevation when down. Pt continues to lack terminal knee extension and full df at ankle so ambulating with flat foot gait but is working towards normal gait pattern. Ambulated 200' with RW and practiced 6 steps with rail. Pt ready for d/c home, just needs RW.     If plan is discharge home, recommend the following: Assistance with cooking/housework;Assist for transportation;Help with stairs or ramp for entrance   Can travel by private vehicle        Equipment Recommendations  Rolling walker (2 wheels)    Recommendations for Other Services       Precautions / Restrictions Precautions Precautions: Fall Restrictions Weight Bearing Restrictions: Yes LLE Weight Bearing: Weight bearing as tolerated     Mobility  Bed Mobility Overal bed mobility: Modified Independent             General bed mobility comments: Increased time due to difficulty moving LLE    Transfers Overall transfer level: Needs assistance Equipment used: Rolling walker (2 wheels) Transfers: Sit to/from Stand Sit to Stand: Modified independent (Device/Increase time)                Ambulation/Gait Ambulation/Gait assistance: Modified independent (Device/Increase time) Gait Distance (Feet): 200  Feet Assistive device: Rolling walker (2 wheels) Gait Pattern/deviations: Step-to pattern, Step-through pattern, Decreased stance time - left, Decreased step length - right, Antalgic Gait velocity: decreased Gait velocity interpretation: 1.31 - 2.62 ft/sec, indicative of limited community ambulator   General Gait Details: pt able to take full wt on LLE today, continues to lack terminal knee ext and full df so striking with flat foot but knows that he needs to work towards getting heel strike back. vc's for posture   Stairs Stairs: Yes Stairs assistance: Modified independent (Device/Increase time) Stair Management: Two rails, Step to pattern, Forwards Number of Stairs: 6 General stair comments: step to pattern and then discussed reversing it for strengthening when his pain decreases   Wheelchair Mobility     Tilt Bed    Modified Rankin (Stroke Patients Only)       Balance Overall balance assessment: Mild deficits observed, not formally tested                                          Cognition Arousal: Alert Behavior During Therapy: WFL for tasks assessed/performed Overall Cognitive Status: Within Functional Limits for tasks assessed                                          Exercises General Exercises - Lower Extremity Ankle Circles/Pumps: AROM, Left, 10 reps Quad Sets: AROM, Left,  10 reps Long Arc Quad: AROM, Left, 5 reps Heel Slides: AROM, Left, 10 reps    General Comments General comments (skin integrity, edema, etc.): discussed activity level for return home and return to work      Pertinent Vitals/Pain Pain Assessment Pain Assessment: Faces Faces Pain Scale: Hurts little more Pain Location: LLE Pain Descriptors / Indicators: Discomfort, Grimacing, Guarding, Operative site guarding Pain Intervention(s): Monitored during session    Home Living                          Prior Function            PT Goals  (current goals can now be found in the care plan section) Acute Rehab PT Goals Patient Stated Goal: to return home and decrease pain PT Goal Formulation: With patient Time For Goal Achievement: 05/03/23 Potential to Achieve Goals: Good Progress towards PT goals: Goals met/education completed, patient discharged from PT    Frequency    Min 1X/week      PT Plan      Lewis-evaluation              AM-PAC PT "6 Clicks" Mobility   Outcome Measure  Help needed turning from your back to your side while in a flat bed without using bedrails?: None Help needed moving from lying on your back to sitting on the side of a flat bed without using bedrails?: None Help needed moving to and from a bed to a chair (including a wheelchair)?: None Help needed standing up from a chair using your arms (e.g., wheelchair or bedside chair)?: None Help needed to walk in hospital room?: None Help needed climbing 3-5 steps with a railing? : None 6 Click Score: 24    End of Session   Activity Tolerance: Patient tolerated treatment well Patient left: Other (comment);with call bell/phone within reach (in bathroom) Nurse Communication: Mobility status PT Visit Diagnosis: Other abnormalities of gait and mobility (R26.89)     Time: 1610-9604 PT Time Calculation (min) (ACUTE ONLY): 17 min  Charges:    $Gait Training: 8-22 mins PT General Charges $$ ACUTE PT VISIT: 1 Visit                     Zachary Lewis, PT  Acute Rehab Services Secure chat preferred Office [phone]    Zachary Lewis 04/21/2023, 11:02 AM

## 2023-04-21 NOTE — Telephone Encounter (Signed)
Per chart review looks like she was given script at hospital. The last one your gave was for 1 year in September 2023. Ok to refill?

## 2023-04-21 NOTE — Discharge Summary (Signed)
[facility] Discharge Summary   Patient ID:  Zachary Lewis MRN: [id number] DOB/AGE: 06-29-1960 63 y.o.  Admit date: 04/18/2023 Discharge date: 04/21/2023 Date of Surgery: 04/18/2023 Surgeon: Surgeon(s): Maeola Harman, MD  Admission Diagnosis: Limb ischemia [I99.8] Critical limb ischemia of left lower extremity [facility]) [I70.222]  Discharge Diagnoses:  Limb ischemia [I99.8] Critical limb ischemia of left lower extremity (HCC) [I70.222]  Secondary Diagnoses: 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) 08/04/2018   Diabetes mellitus    Type II   Hyperlipidemia    Hypertension    NSAID-induced gastric ulcer 12/13/2018   EGD 11/2018 H pylori neg Omeprazole started   Obesity    Personal history of colonic adenomas 03/09/2013   Tinea pedis    Chronic    Procedure(s): LEFT FEMORAL-PERONEAL ARTERY BYPASS VEIN HARVEST USING GREATER SAPHENOUS VEIN  Discharged Condition: improved  HPI:  62 year old male with history of 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 He has undergone angiography which demonstrates occlusion of his distal left SFA with reconstitution of the mid calf peroneal artery. He is indicated for bypass from either the left common femoral artery versus the left SFA to the peroneal artery depending on availability of saphenous vein.   Hospital Course:  Zachary Lewis is a 62 y.o. male is S/P  Procedure(s): LEFT FEMORAL-PERONEAL ARTERY BYPASS VEIN HARVEST USING GREATER SAPHENOUS VEIN He has a brisk peroneal doppler signal, incisions are well healing.  The left GT wound remained dry and unchanged.  He was independently ambulating with walker.  He has had pain control issues that required 2 percocet's every 4 hours.  He was discharged in stable condition 04/21/23 with  #30 tablets 1 q 6 hours.    Consults:    Significant Diagnostic Studies: CBC Lab Results  Component Value Date   WBC 9.4 04/21/2023   HGB 13.1 04/21/2023   HCT 38.5 (L) 04/21/2023   MCV 100.8 (H) 04/21/2023   PLT 282 04/21/2023    BMET    Component Value Date/Time   NA 136 04/21/2023 0958   K 4.1 04/21/2023 0958   CL 101 04/21/2023 0958   CO2 23 04/21/2023 0958   GLUCOSE 250 (H) 04/21/2023 0958   BUN 16 04/21/2023 0958   CREATININE 0.90 04/21/2023 0958   CALCIUM 9.3 04/21/2023 0958   GFRNONAA >60 04/21/2023 0958   GFRAA >60 06/01/2018 0427   COAG Lab Results  Component Value Date   INR 1.1 (H) 11/25/2022   INR 1.0 07/17/2018   INR 1.3 (H) 07/02/2018     Disposition:  Discharge to :Home Discharge Instructions     Activity as tolerated - No restrictions   Complete by: As directed    Activity as tolerated - No restrictions   Complete by: As directed    Call MD for:  redness, tenderness, or signs of infection (pain, swelling, bleeding, redness, odor or green/yellow discharge around incision site)   Complete by: As directed    Call MD for:  redness, tenderness, or signs of infection (pain, swelling, bleeding, redness, odor or green/yellow discharge around incision site)   Complete by: As directed    Call MD for:  severe or increased pain, loss or decreased feeling  in affected limb(s)   Complete by: As directed    Call MD for:  severe or increased pain, loss or decreased feeling  in affected limb(s)   Complete by: As directed    Call MD for:  temperature >100.5   Complete by: As directed    Call MD for:  temperature >100.5   Complete by: As directed    Increase activity slowly   Complete by: As directed    Walk with assistance use walker or cane as needed   Increase activity slowly   Complete by: As directed    Walk with assistance use walker or cane as needed   May shower    Complete by: As directed    May shower    Complete by: As directed    Resume  previous diet   Complete by: As directed    Resume previous diet   Complete by: As directed       Allergies as of 04/21/2023   No Known Allergies      Medication List     TAKE these medications    amoxicillin-clavulanate 875-125 MG tablet Commonly known as: AUGMENTIN Take 1 tablet by mouth every 12 (twelve) hours for 10 days.   aspirin EC 81 MG tablet Take 1 tablet (81 mg total) by mouth daily. Swallow whole.   clopidogrel 75 MG tablet Commonly known as: PLAVIX Take 1 tablet (75 mg total) by mouth daily.   clotrimazole-betamethasone cream Commonly known as: LOTRISONE Apply 1 Application topically daily.   Dexcom G7 Sensor Misc 1 Device by Does not apply route continuous.   doxycycline 100 MG tablet Commonly known as: VIBRA-TABS Take 1 tablet (100 mg total) by mouth every 12 (twelve) hours for 10 days.   ezetimibe 10 MG tablet Commonly known as: ZETIA Take 1 tablet (10 mg total) by mouth daily.   gabapentin 100 MG capsule Commonly known as: NEURONTIN Take 1 capsule (100 mg total) by mouth 3 (three) times daily as needed (nerve pain).   Insulin Pen Needle 31G X 8 MM Misc Inject 1 Needle into the skin daily at 6 (six) AM.   Lantus SoloStar 100 UNIT/ML Solostar Pen Generic drug: insulin glargine Inject 10 Units into the skin daily. Plus sliding scale, max dose 50 units a day   OneTouch Verio test strip Generic drug: glucose blood 1 each by Other route daily. And lancets 1/day   oxyCODONE 5 MG immediate release tablet Commonly known as: Oxy IR/ROXICODONE Take 1 tablet (5 mg total) by mouth every 6 (six) hours as needed for moderate pain (pain score 4-6).   Repatha SureClick 140 MG/ML Soaj Generic drug: Evolocumab Inject 140 mg into the skin every 14 (fourteen) days. What changed:  how much to take additional instructions   Semaglutide(0.25 or 0.5MG /DOS) 2 MG/3ML Sopn Inject 0.25 mg into the skin once a week. What changed: when to take this    spironolactone 50 MG tablet Commonly known as: ALDACTONE Take 1 tablet (50 mg total) by mouth daily.   tadalafil 20 MG tablet Commonly known as: CIALIS Take 1 tablet (20 mg total) by mouth daily as needed.   thiamine 100 MG tablet Commonly known as: VITAMIN B1 Take 1 tablet (100 mg total) by mouth daily.               Durable Medical Equipment  (From admission, onward)           Start     Ordered   04/21/23 1142  For home use only DME Walker rolling  Once  Question Answer Comment  Walker: With 5 Inch Wheels   Patient needs a walker to treat with the following condition Physical deconditioning      04/21/23 1142           Verbal and written Discharge instructions given to the patient. Wound care per Discharge AVS  Follow-up Information     Maeola Harman, MD Follow up in 2 week(s).   Specialties: Vascular Surgery, Cardiology Why: Office will call you to arrange your appt (sent) Contact information: [address] [place] [address] [phone]         [facility] Follow up.   Why: ([employer])- rolling walker arranged- to be delivered to room prior to discharge Contact information: [address] [place] [address] [phone]                 Signed: Mosetta Pigeon 04/21/2023, 1:13 PM - For VQI Registry use --- Instructions: Press F2 to tab through selections.  Delete question if not applicable.   Post-op:  Wound infection: No  Graft infection: No  Transfusion: No  If yes, 0 units given New Arrhythmia: No Ipsilateral amputation: [x ] no, [ ]  Minor, [ ]  BKA, [ ]  AKA Discharge patency: [ ]  Primary, [ ]  Primary assisted, [ ]  Secondary, [ ]  Occluded Patency judged by: [x ] Dopper only, [ ]  Palpable graft pulse, [ ]  Palpable distal pulse, [ ]  ABI inc. > 0.15, [ ]  Duplex  D/C Ambulatory Status: Ambulatory  Complications: MI: [x ] No, [ ]  Troponin only, [ ]  EKG or Clinical CHF: No Resp failure: [ ]   none, [ ]  Pneumonia, [ ]  Ventilator Chg in renal function: [ ]  none, [ ]  Inc. Cr > 0.5, [ ]  Temp. Dialysis, [ ]  Permanent dialysis Stroke: [ ]  None, [ ]  Minor, [ ]  Major Return to OR: No  Reason for return to OR: [ ]  Bleeding, [ ]  Infection, [ ]  Thrombosis, [ ]  Revision  Discharge medications: Statin use:  No  for medical reason   ASA use:  Yes Plavix use:  Yes Beta blocker use: No  for medical reason   Coumadin use: No  for medical reason

## 2023-04-21 NOTE — Progress Notes (Signed)
Discharge instructions (including medications) discussed with and copy provided to patient/caregiver 

## 2023-04-22 ENCOUNTER — Encounter: Payer: Self-pay | Admitting: Vascular Surgery

## 2023-04-22 ENCOUNTER — Telehealth: Payer: Self-pay | Admitting: *Deleted

## 2023-04-22 NOTE — Telephone Encounter (Signed)
Pt called concern of redness and swelling  and incision site leaking on operative leg.Pt spouse states that today the legs look better swelling has decreased . Encouraged to elevate left leg above the heart and to keep incision clean and dry with gauze if needed for drainage.  Advised to call for further concerns or questions .

## 2023-04-23 ENCOUNTER — Ambulatory Visit (INDEPENDENT_AMBULATORY_CARE_PROVIDER_SITE_OTHER): Payer: No Typology Code available for payment source | Admitting: Podiatry

## 2023-04-23 DIAGNOSIS — E0843 Diabetes mellitus due to underlying condition with diabetic autonomic (poly)neuropathy: Secondary | ICD-10-CM

## 2023-04-23 DIAGNOSIS — D2372 Other benign neoplasm of skin of left lower limb, including hip: Secondary | ICD-10-CM

## 2023-04-23 DIAGNOSIS — L97522 Non-pressure chronic ulcer of other part of left foot with fat layer exposed: Secondary | ICD-10-CM

## 2023-04-23 DIAGNOSIS — E1351 Other specified diabetes mellitus with diabetic peripheral angiopathy without gangrene: Secondary | ICD-10-CM | POA: Diagnosis not present

## 2023-04-23 DIAGNOSIS — M79675 Pain in left toe(s): Secondary | ICD-10-CM | POA: Diagnosis not present

## 2023-04-23 DIAGNOSIS — M79674 Pain in right toe(s): Secondary | ICD-10-CM

## 2023-04-23 DIAGNOSIS — B351 Tinea unguium: Secondary | ICD-10-CM

## 2023-04-23 NOTE — Progress Notes (Addendum)
No chief complaint on file.   SUBJECTIVE Patient with a history of diabetes mellitus presents to office today complaining of elongated, thickened nails that cause pain while ambulating in shoes.  Patient is unable to trim their own nails.  Patient also has symptomatic calluses to the bilateral feet.    Patient also presenting for outpatient follow-up regarding recent admission to the hospital.  He underwent vascular intervention to improve his circulation to the lower extremities.  He has developed ischemic ulcers to the distal tips of the left great toe as well as the left fifth digit.  Patient is here for further evaluation and treatment.  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) 08/04/2018   Diabetes mellitus    Type II   Hyperlipidemia    Hypertension    NSAID-induced gastric ulcer 12/13/2018   EGD 11/2018 H pylori neg Omeprazole started   Obesity    Personal history of colonic adenomas 03/09/2013   Tinea pedis    Chronic    No Known Allergies   OBJECTIVE General Patient is awake, alert, and oriented x 3 and in no acute distress. Derm Nails are tender, long, thickened and dystrophic with subungual debris, consistent with onychomycosis, 1-5 bilateral. No signs of infection noted.  Hyperkeratotic benign skin lesions noted to the bilateral feet Ulcers noted to the distal tip of the left great toe as well as the left fifth digit.  Each measuring approximately 1.5 x 1.5 x 0.1 cm.  Well adhered superficial scar noted.  It appears very stable with no drainage.  No exposed bone.  Clinically no indication of infection Vasc  PERIPHERAL VASCULAR CATHETERIZATION 04/15/2023 Findings: Widely patent infrarenal aorta and bilateral renal arteries.  Widely patent bilateral iliac systems.  Widely patent left common femoral and profunda arteries.  Mild multifocal disease of the left SFA with a distal  chronic occlusion.  The popliteal artery is occluded the peroneal artery is reconstituted distally which fills the DP on the foot.  The PT is chronically occluded and does not appear to reconstitute.   Neuro light touch and protective threshold sensation diminished bilaterally.   Musculoskeletal Exam No symptomatic pedal deformities noted bilateral. Muscular strength within normal limits.  Patient ambulatory.  No prior amputations  ASSESSMENT 1. 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  Pain due to onychomycosis of toenails bilateral 3.  Ulcer left great toe and fifth digit secondary to diabetes and peripheral limb ischemia  PLAN OF CARE -Patient evaluated today.   - Medically necessary excisional debridement including subcutaneous tissue was performed today using a tissue nipper.  Excisional debridement of the necrotic nonviable tissue down to healthier bleeding viable tissue was performed with postoperative measurement signs. -Mechanical debridement of nails 1-5 bilaterally performed using a nail nipper. Filed with dremel without incident.  -Excisional debridement of the hyperkeratotic skin lesions was performed today using a 312 scalpel without incident or bleeding.  Salicylic acid and a light Band-Aid applied. - Recommend Betadine application to the left great toe as well as left fifth toe daily -Return to clinic 4 weeks  *Works at [employer] x 20 years  Felecia Shelling, DPM [facility]  Dr. Felecia Shelling, DPM    [address]  [place], [place] [address]                Office [phone]  Fax [phone]

## 2023-04-23 NOTE — Addendum Note (Signed)
Addended by: Felecia Shelling on: 04/23/2023 10:52 AM   Modules accepted: Level of Service

## 2023-04-25 ENCOUNTER — Other Ambulatory Visit: Payer: Self-pay | Admitting: Internal Medicine

## 2023-04-25 ENCOUNTER — Encounter: Payer: Self-pay | Admitting: Internal Medicine

## 2023-04-25 DIAGNOSIS — K7460 Unspecified cirrhosis of liver: Secondary | ICD-10-CM

## 2023-04-29 ENCOUNTER — Other Ambulatory Visit: Payer: Self-pay

## 2023-04-29 DIAGNOSIS — E1165 Type 2 diabetes mellitus with hyperglycemia: Secondary | ICD-10-CM

## 2023-04-29 MED ORDER — DEXCOM G7 SENSOR MISC: 1.0000 | 0 refills | Status: AC

## 2023-04-29 MED ORDER — INSULIN PEN NEEDLE 31G X 8 MM MISC: 1.0000 | Freq: Every day | 0 refills | Status: DC

## 2023-04-30 ENCOUNTER — Encounter: Payer: Self-pay | Admitting: Vascular Surgery

## 2023-05-01 ENCOUNTER — Encounter: Payer: Self-pay | Admitting: Vascular Surgery

## 2023-05-01 ENCOUNTER — Other Ambulatory Visit: Payer: Self-pay

## 2023-05-01 DIAGNOSIS — E1165 Type 2 diabetes mellitus with hyperglycemia: Secondary | ICD-10-CM

## 2023-05-01 MED ORDER — SEMAGLUTIDE(0.25 OR 0.5MG/DOS) 2 MG/3ML ~~LOC~~ SOPN: 0.2500 mg | PEN_INJECTOR | SUBCUTANEOUS | 0 refills | Status: DC

## 2023-05-07 ENCOUNTER — Ambulatory Visit (INDEPENDENT_AMBULATORY_CARE_PROVIDER_SITE_OTHER): Payer: No Typology Code available for payment source | Admitting: Vascular Surgery

## 2023-05-07 ENCOUNTER — Encounter: Payer: Self-pay | Admitting: Vascular Surgery

## 2023-05-07 VITALS — BP 166/82 | HR 104 | Temp 97.9°F | Resp 20 | Ht 66.0 in | Wt 190.0 lb

## 2023-05-07 DIAGNOSIS — I70222 Atherosclerosis of native arteries of extremities with rest pain, left leg: Secondary | ICD-10-CM

## 2023-05-07 DIAGNOSIS — M869 Osteomyelitis, unspecified: Secondary | ICD-10-CM

## 2023-05-07 NOTE — Progress Notes (Signed)
     Subjective:     Patient ID: Zachary Lewis, male   DOB: 03-03-1961, 62 y.o.   MRN: [id number]  HPI 62 year old male status post left SFA to peroneal artery bypass with vein.  He is followed by Dr. Logan Bores with podiatry for toe tip gangrene and they are using Betadine at this time.  Patient is walking without any significant pain and has 8 weeks off from work.  He has been 3 weeks without cigarettes.   Review of Systems Pain improved    Objective:   Physical Exam Vitals:   05/07/23 1409  BP: (!) 166/82  Pulse: (!) 104  Resp: 20  Temp: 97.9 F (36.6 C)  SpO2: 98%   Awake alert oriented Nonlabored respirations Very strong peroneal signal in the left ankle that can be traced on the dorsalis pedis all the way to the first web interspace Stable gangrenous changes left great toe tip    Assessment/plan     62 year old male status post left SFA peroneal artery bypass with vein now healing ulceration of the left great toe.  Will plan to follow-up in 4 to 6 weeks with left lower extremity bypass graft duplex and ABIs.  I congratulated him on smoking cessation and encouraged him to continue.    [employer] C. Randie Heinz, MD [facility] [place] Office: [phone] Pager: [phone]

## 2023-05-09 ENCOUNTER — Encounter: Payer: Self-pay | Admitting: "Endocrinology

## 2023-05-09 ENCOUNTER — Ambulatory Visit (INDEPENDENT_AMBULATORY_CARE_PROVIDER_SITE_OTHER): Payer: No Typology Code available for payment source | Admitting: "Endocrinology

## 2023-05-09 ENCOUNTER — Other Ambulatory Visit: Payer: Self-pay

## 2023-05-09 VITALS — BP 120/72 | Ht 66.0 in | Wt 191.4 lb

## 2023-05-09 DIAGNOSIS — E1165 Type 2 diabetes mellitus with hyperglycemia: Secondary | ICD-10-CM

## 2023-05-09 DIAGNOSIS — Z7984 Long term (current) use of oral hypoglycemic drugs: Secondary | ICD-10-CM

## 2023-05-09 DIAGNOSIS — E7800 Pure hypercholesterolemia, unspecified: Secondary | ICD-10-CM

## 2023-05-09 MED ORDER — INSULIN PEN NEEDLE 31G X 8 MM MISC: 1.0000 | Freq: Every day | 5 refills | Status: AC

## 2023-05-09 MED ORDER — OZEMPIC (0.25 OR 0.5 MG/DOSE) 2 MG/3ML ~~LOC~~ SOPN: 0.5000 mg | PEN_INJECTOR | SUBCUTANEOUS | 0 refills | Status: DC

## 2023-05-09 NOTE — Patient Instructions (Addendum)
Start Lantus 25 units once every morning Start ozempic 0.5 mg/wk  _______________   Goals of DM therapy:  Morning Fasting blood sugar: 80-140  Blood sugar before meals: 80-140 Bed time blood sugar: 100-150  A1C <7%, limited only by hypoglycemia  1.Diabetes medications and their side effects discussed, including hypoglycemia    2. Check blood glucose:  a) Always check blood sugars before driving. Please see below (under hypoglycemia) on how to manage b) Check a minimum of 3 times/day or more as needed when having symptoms of hypoglycemia.   c) Try to check blood glucose before sleeping/in the middle of the night to ensure that it is remaining stable and not dropping less than 100 d) Check blood glucose more often if sick  3. Diet: a) 3 meals per day schedule b: Restrict carbs to 60-70 grams (4 servings) per meal, 15 gm per meal c) Colorful vegetables - 3 servings a day, and low sugar fruit 2 servings/day Plate control method: 1/4 plate protein, 1/4 starch, 1/2 green, yellow, or red vegetables d) Avoid carbohydrate snacks unless hypoglycemic episode, or increased physical activity  4. Regular exercise as tolerated, preferably 3 or more hours a week  5. Hypoglycemia: a)  Do not drive or operate machinery without first testing blood glucose to assure it is over 90 mg%, or if dizzy, lightheaded, not feeling normal, etc, or  if foot or leg is numb or weak. b)  If blood glucose less than 70, take four 5gm Glucose tabs or 15-30 gm Glucose gel.  Repeat every 15 min as needed until blood sugar is >100 mg/dl. If hypoglycemia persists then call 911.   6. Sick day management: a) Check blood glucose more often b) Continue usual therapy if blood sugars are elevated.   7. Contact the doctor immediately if blood glucose is frequently <60 mg/dl, or an episode of severe hypoglycemia occurs (where someone had to give you glucose/  glucagon or if you passed out from a low blood glucose), or if  blood glucose is persistently >350 mg/dl, for further management  8. A change in level of physical activity or exercise and a change in diet may also affect your blood sugar. Check blood sugars more often and call if needed.  Instructions: 1. Bring glucose meter, blood glucose records on every visit for review 2. Continue to follow up with primary care physician and other providers for medical care 3. Yearly eye  and foot exam 4. Please get blood work done prior to the next appointment

## 2023-05-09 NOTE — Progress Notes (Signed)
Outpatient Endocrinology Note Zachary [place], MD  05/09/23   Zachary Lewis 10-May-1963 [id number]  Referring Provider: Judy Pimple, MD Primary Care Provider: Judy Pimple, MD Reason for consultation: Subjective   Assessment & Plan  Zachary Lewis was seen today for follow-up.  Diagnoses and all orders for this visit:  Uncontrolled type 2 diabetes mellitus with hyperglycemia (HCC) -     Ambulatory referral to diabetic education  Long term (current) use of oral hypoglycemic drugs  Pure hypercholesterolemia  Other orders -     Semaglutide,0.25 or 0.5MG /DOS, (OZEMPIC, 0.25 OR 0.5 MG/DOSE,) 2 MG/3ML SOPN; Inject 0.5 mg into the skin once a week.   Diabetes complicated by neuropathy Hba1c goal less than 7.0, last Hba1c was 10.8. Will recommend for the following change of medications to: Increase gradually over next 3 days to Lantus 25 units once every morning Increase to ozempic 0.5 mg/wk Sent dexcom  No known contraindications to any of above medications No history of MEN syndrome/medullary thyroid cancer/pancreatitis or pancreatic cancer in self or family  Hyperlipidemia -04/2023: Last LDL improved from 150 to 38 -not on statin -On repatha 140 mg every 2 wks, ezetimibe 10 mg every day  -Follow low fat diet and exercise   -Blood pressure goal <140/90 - Microalbumin/creatinine at goal < 30 three yrs ago-did not do lab, reordered  -not on ACE/ARB  -diet changes including salt restriction -limit eating outside -counseled BP targets per standards of diabetes care -Uncontrolled blood pressure can lead to retinopathy, nephropathy and cardiovascular and atherosclerotic heart disease  Reviewed and counseled on: -A1C target -Blood sugar targets -Complications of uncontrolled diabetes  -Checking blood sugar before meals and bedtime and bring log next visit -All medications with mechanism of action and side effects -Hypoglycemia management: rule of 15's, Glucagon  Emergency Kit and medical alert ID -low-carb low-fat plate-method diet -At least 20 minutes of physical activity per day -Annual dilated retinal eye exam and foot exam -compliance and follow up needs -follow up as scheduled or earlier if problem gets worse  Call if blood sugar is less than 70 or consistently above 250    Take a 15 gm snack of carbohydrate at bedtime before you go to sleep if your blood sugar is less than 100.    If you are going to fast after midnight for a test or procedure, ask your physician for instructions on how to reduce/decrease your insulin dose.    Call if blood sugar is less than 70 or consistently above 250  -Treating a low sugar by rule of 15  (15 gms of sugar every 15 min until sugar is more than 70) If you feel your sugar is low, test your sugar to be sure If your sugar is low (less than 70), then take 15 grams of a fast acting Carbohydrate (3-4 glucose tablets or glucose gel or 4 ounces of juice or regular soda) Recheck your sugar 15 min after treating low to make sure it is more than 70 If sugar is still less than 70, treat again with 15 grams of carbohydrate          Don't drive the hour of hypoglycemia  If unconscious/unable to eat or drink by mouth, use glucagon injection or nasal spray baqsimi and call 911. Can repeat again in 15 min if still unconscious.  Return in about 3 weeks (around 05/30/2023).   I have reviewed current medications, nurse's notes, allergies, vital signs, past medical and surgical history, family  medical history, and social history for this encounter. Counseled patient on symptoms, examination findings, lab findings, imaging results, treatment decisions and monitoring and prognosis. The patient understood the recommendations and agrees with the treatment plan. All questions regarding treatment plan were fully answered.  Zachary Bowman, MD  05/09/23   History of Present Illness Zachary Lewis is a 62 y.o. year old male who  presents for follow up of Type 2 diabetes mellitus.  Zachary Lewis was first diagnosed in 2010.   Diabetes education +  Home diabetes regimen: Lantus 10 units once every morning Ozempic 0.25 mg/wk  Took rybelsus in the past  COMPLICATIONS -  MI/Stroke -  retinopathym +  neuropathy -  nephropathy  BLOOD SUGAR DATA  CGM interpretation: At today's visit, we reviewed his CGM downloads. The full report is scanned in the media. Reviewing the CGM trends, BG are elevated across the day.  Physical Exam  BP 120/72 (BP Location: Right Arm, Patient Position: Sitting, Cuff Size: Large)   Ht 5\' 6"  (1.676 m)   Wt 191 lb 6.4 oz (86.8 kg)   BMI 30.89 kg/m    Constitutional: well developed, well nourished Head: normocephalic, atraumatic Eyes: sclera anicteric, no redness Neck: supple Lungs: normal respiratory effort Neurology: alert and oriented Skin: dry, no appreciable rashes Musculoskeletal: no appreciable defects Psychiatric: normal mood and affect Diabetic Foot Exam - Simple   No data filed      Current Medications Patient's Medications  New Prescriptions   SEMAGLUTIDE,0.25 OR 0.5MG /DOS, (OZEMPIC, 0.25 OR 0.5 MG/DOSE,) 2 MG/3ML SOPN    Inject 0.5 mg into the skin once a week.  Previous Medications   ASPIRIN EC 81 MG TABLET    Take 1 tablet (81 mg total) by mouth daily. Swallow whole.   CLOPIDOGREL (PLAVIX) 75 MG TABLET    Take 1 tablet (75 mg total) by mouth daily.   CLOTRIMAZOLE-BETAMETHASONE (LOTRISONE) CREAM    Apply 1 Application topically daily.   CONTINUOUS GLUCOSE SENSOR (DEXCOM G7 SENSOR) MISC    1 Device by Does not apply route continuous.   EVOLOCUMAB (REPATHA SURECLICK) 140 MG/ML SOAJ    Inject 140 mg into the skin every 14 (fourteen) days.   EZETIMIBE (ZETIA) 10 MG TABLET    Take 1 tablet (10 mg total) by mouth daily.   GABAPENTIN (NEURONTIN) 100 MG CAPSULE    Take 1 capsule (100 mg total) by mouth 3 (three) times daily as needed (nerve pain).   GLUCOSE BLOOD  (ONETOUCH VERIO) TEST STRIP    1 each by Other route daily. And lancets 1/day   INSULIN GLARGINE (LANTUS SOLOSTAR) 100 UNIT/ML SOLOSTAR PEN    Inject 10 Units into the skin daily. Plus sliding scale, max dose 50 units a day   OXYCODONE (OXY IR/ROXICODONE) 5 MG IMMEDIATE RELEASE TABLET    Take 1 tablet (5 mg total) by mouth every 6 (six) hours as needed for moderate pain (pain score 4-6).   SPIRONOLACTONE (ALDACTONE) 50 MG TABLET    TAKE 1 TABLET(50 MG) BY MOUTH DAILY   TADALAFIL (CIALIS) 20 MG TABLET    Take 1 tablet (20 mg total) by mouth daily as needed.   THIAMINE 100 MG TABLET    Take 1 tablet (100 mg total) by mouth daily.  Modified Medications   Modified Medication Previous Medication   INSULIN PEN NEEDLE 31G X 8 MM MISC Insulin Pen Needle 31G X 8 MM MISC      Inject 1 Needle into the skin daily  at 6 (six) AM.    Inject 1 Needle into the skin daily at 6 (six) AM.  Discontinued Medications   SEMAGLUTIDE,0.25 OR 0.5MG /DOS, 2 MG/3ML SOPN    Inject 0.25 mg into the skin once a week.    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) 08/04/2018   Diabetes mellitus    Type II   Hyperlipidemia    Hypertension    NSAID-induced gastric ulcer 12/13/2018   EGD 11/2018 H pylori neg Omeprazole started   Obesity    Personal history of colonic adenomas 03/09/2013   Tinea pedis    Chronic    Past Surgical History Past Surgical History:  Procedure Laterality Date   ABDOMINAL AORTOGRAM W/LOWER EXTREMITY N/A 04/15/2023   Procedure: ABDOMINAL AORTOGRAM W/LOWER EXTREMITY;  Surgeon: Daria Pastures, MD;  Location: [facility];  Service: Cardiovascular;  Laterality: N/A;   BYPASS GRAFT FEMORAL-PERONEAL Left 04/18/2023   Procedure: LEFT FEMORAL-PERONEAL ARTERY BYPASS;  Surgeon: Maeola Harman, MD;  Location: [facility] OR;  Service: Vascular;   Laterality: Left;   COLONOSCOPY     COLONOSCOPY W/ POLYPECTOMY  2014   IR PARACENTESIS  07/02/2018   IR TRANSCATHETER BX  07/17/2018   IR VENOGRAM HEPATIC WO HEMODYNAMIC EVALUATION  07/17/2018   myringectomy  06/2005   POLYPECTOMY     UMBILICAL HERNIA REPAIR  01/1997   VASECTOMY  07/2002   VEIN HARVEST Left 04/18/2023   Procedure: VEIN HARVEST USING GREATER SAPHENOUS VEIN;  Surgeon: Maeola Harman, MD;  [facility];  Service: Vascular;  Laterality: Left;    Family History family history includes Hypertension in an other family member.  Social History Social History   Socioeconomic History   Marital status: Married    Spouse name: Not on file   Number of children: 5   Years of education: Not on file   Highest education level: Not on file  Occupational History   Occupation: [employer]    Employer: [employer]   Occupation: [employer]  Tobacco Use   Smoking status: Light Smoker    Current packs/day: 0.25    Types: Cigarettes   Smokeless tobacco: Never  Vaping Use   Vaping status: Never Used  Substance and Sexual Activity   Alcohol use: Yes    Comment: 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 [employer]   Smoker, heavy drinking of [place] mist alcohol in past   No drug use   Social Drivers of Corporate investment banker Strain: Not on file  Food Insecurity: No Food Insecurity (04/18/2023)   Hunger Vital Sign    Worried About Running Out of Food in the Last Year: Never true    Ran Out of Food in the Last Year: Never true  Transportation Needs: No Transportation Needs (04/18/2023)   PRAPARE - Administrator, Civil Service (Medical): No    Lack of Transportation (Non-Medical): No  Physical Activity: Not on file  Stress: Not on file   Social Connections: Not on file  Intimate Partner Violence: Not At Risk (04/18/2023)   Humiliation,  Afraid, Rape, and Kick questionnaire    Fear of Current or Ex-Partner: No    Emotionally Abused: No    Physically Abused: No    Sexually Abused: No    Lab Results  Component Value Date   HGBA1C 10.8 (H) 04/12/2023   HGBA1C 11.0 (H) 09/27/2022   HGBA1C 9.7 (H) 02/01/2022   Lab Results  Component Value Date   CHOL 93 04/19/2023   Lab Results  Component Value Date   HDL 40 (L) 04/19/2023   Lab Results  Component Value Date   LDLCALC 38 04/19/2023   Lab Results  Component Value Date   TRIG 73 04/19/2023   Lab Results  Component Value Date   CHOLHDL 2.3 04/19/2023   Lab Results  Component Value Date   CREATININE 0.90 04/21/2023   Lab Results  Component Value Date   GFR 95.94 09/27/2022   Lab Results  Component Value Date   MICROALBUR 3.5 (H) 02/15/2019      Component Value Date/Time   NA 136 04/21/2023 0958   K 4.1 04/21/2023 0958   CL 101 04/21/2023 0958   CO2 23 04/21/2023 0958   GLUCOSE 250 (H) 04/21/2023 0958   BUN 16 04/21/2023 0958   CREATININE 0.90 04/21/2023 0958   CALCIUM 9.3 04/21/2023 0958   PROT 7.9 04/18/2023 0544   ALBUMIN 3.8 04/18/2023 0544   AST 55 (H) 04/18/2023 0544   ALT 80 (H) 04/18/2023 0544   ALKPHOS 164 (H) 04/18/2023 0544   BILITOT 0.6 04/18/2023 0544   GFRNONAA >60 04/21/2023 0958   GFRAA >60 06/01/2018 0427      Latest Ref Rng & Units 04/21/2023    9:58 AM 04/20/2023    3:40 AM 04/19/2023    2:45 AM  BMP  Glucose 70 - 99 mg/dL 782  956  213   BUN 8 - 23 mg/dL 16  16  12    Creatinine 0.61 - 1.24 mg/dL 0.86  5.78  4.69   Sodium 135 - 145 mmol/L 136  132  131   Potassium 3.5 - 5.1 mmol/L 4.1  3.9  4.3   Chloride 98 - 111 mmol/L 101  101  101   CO2 22 - 32 mmol/L 23  21  22    Calcium 8.9 - 10.3 mg/dL 9.3  8.8  8.9        Component Value Date/Time   WBC 9.4 04/21/2023 0958   RBC 3.82 (L) 04/21/2023 0958   HGB 13.1  04/21/2023 0958   HCT 38.5 (L) 04/21/2023 0958   PLT 282 04/21/2023 0958   MCV 100.8 (H) 04/21/2023 0958   MCH 34.3 (H) 04/21/2023 0958   MCHC 34.0 04/21/2023 0958   RDW 12.6 04/21/2023 0958   LYMPHSABS 1.5 04/14/2023 0421   MONOABS 0.7 04/14/2023 0421   EOSABS 0.5 04/14/2023 0421   BASOSABS 0.1 04/14/2023 0421     Parts of this note may have been dictated using voice recognition software. There may be variances in spelling and vocabulary which are unintentional. Not all errors are proofread. Please notify the Thereasa Parkin if any discrepancies are noted or if the meaning of any statement is not clear.

## 2023-05-12 ENCOUNTER — Ambulatory Visit (INDEPENDENT_AMBULATORY_CARE_PROVIDER_SITE_OTHER): Payer: No Typology Code available for payment source | Admitting: Family Medicine

## 2023-05-12 ENCOUNTER — Encounter: Payer: Self-pay | Admitting: Family Medicine

## 2023-05-12 ENCOUNTER — Encounter: Payer: Self-pay | Admitting: "Endocrinology

## 2023-05-12 ENCOUNTER — Ambulatory Visit (HOSPITAL_COMMUNITY)
Admission: RE | Admit: 2023-05-12 | Discharge: 2023-05-12 | Disposition: A | Discharge status: Home / Self Care | Payer: No Typology Code available for payment source | Source: Ambulatory Visit | Attending: Internal Medicine | Admitting: Internal Medicine

## 2023-05-12 VITALS — BP 124/70 | HR 97 | Temp 98.4°F | Ht 66.0 in | Wt 196.0 lb

## 2023-05-12 DIAGNOSIS — H9193 Unspecified hearing loss, bilateral: Secondary | ICD-10-CM

## 2023-05-12 DIAGNOSIS — R188 Other ascites: Secondary | ICD-10-CM | POA: Diagnosis present

## 2023-05-12 DIAGNOSIS — E1165 Type 2 diabetes mellitus with hyperglycemia: Secondary | ICD-10-CM

## 2023-05-12 DIAGNOSIS — K709 Alcoholic liver disease, unspecified: Secondary | ICD-10-CM | POA: Diagnosis not present

## 2023-05-12 DIAGNOSIS — R7989 Other specified abnormal findings of blood chemistry: Secondary | ICD-10-CM

## 2023-05-12 DIAGNOSIS — K7460 Unspecified cirrhosis of liver: Secondary | ICD-10-CM | POA: Insufficient documentation

## 2023-05-12 DIAGNOSIS — H9190 Unspecified hearing loss, unspecified ear: Secondary | ICD-10-CM | POA: Insufficient documentation

## 2023-05-12 DIAGNOSIS — Z23 Encounter for immunization: Secondary | ICD-10-CM

## 2023-05-12 DIAGNOSIS — I10 Essential (primary) hypertension: Secondary | ICD-10-CM

## 2023-05-12 DIAGNOSIS — Z794 Long term (current) use of insulin: Secondary | ICD-10-CM

## 2023-05-12 DIAGNOSIS — E785 Hyperlipidemia, unspecified: Secondary | ICD-10-CM

## 2023-05-12 DIAGNOSIS — E1169 Type 2 diabetes mellitus with other specified complication: Secondary | ICD-10-CM

## 2023-05-12 DIAGNOSIS — R4189 Other symptoms and signs involving cognitive functions and awareness: Secondary | ICD-10-CM | POA: Diagnosis not present

## 2023-05-12 MED ORDER — ONETOUCH ULTRASOFT LANCETS MISC: 12 refills | Status: DC

## 2023-05-12 NOTE — Progress Notes (Unsigned)
Subjective:    Patient ID: Zachary Lewis, male    DOB: [birth year], 62 y.o.   MRN: [id number]  HPI  Wt Readings from Last 3 Encounters:  05/12/23 196 lb (88.9 kg)  05/09/23 191 lb 6.4 oz (86.8 kg)  05/07/23 190 lb (86.2 kg)   31.64 kg/m  Vitals:   05/12/23 1423  BP: 124/70  Pulse: 97  Temp: 98.4 F (36.9 C)  SpO2: 98%    Pt presents for memory concerns (in setting of complex medical problems and former alcohol abuse)   Also flu shot and shingrix   More forgetful  Since last march as least /perhaps about a year   Asked for help opening a suitcase last march -unusual  Dislikes driving on highway   Has forgotten what he was watching during commercial Some name recognition  Family (wife)  notes little things around the house  Forgets things they need to do  Forgets appointments   Can get frustrated when he cannot remember  Sometimes takes it out on family   Not making mistakes at work  No car accidents   Is good about writing stuff down  Is home on medical leave  Goes back mid [place]    Does read  Can keep up with plot in book or TV show   Etoh status   Was drinking a little - 2-3 times per week (2-3 at a time)  Was talking to a counselor once per week  Stopped drinking 31 days ago with his leg surgery   Smoking status  No smoking for 31 days  Used a patch for first week and has not needed   Easier with his wife home now    Takes gabapentin 100 mg tid prn  Took it a few doses- did not help leg pain  His leg is not too painful   DM2 Lantus and ozempic  Was able to quit smoking Seeing endocrinology   Lab Results  Component Value Date   HGBA1C 10.8 (H) 04/12/2023    Repatha for hyperlipidemia  Also zetia 10 mg daily  Lab Results  Component Value Date   CHOL 93 04/19/2023   HDL 40 (L) 04/19/2023   LDLCALC 38 04/19/2023   LDLDIRECT 176.9 12/08/2012   TRIG 73 04/19/2023   CHOLHDL 2.3 04/19/2023     Pt had osteomyelitis of left foot/  SFA peroneal artery bypass  Taking plavix   Hearing loss Hearing Screening   500Hz  1000Hz  2000Hz  4000Hz   Right ear 40 40 40 40  Left ear 40 40 40 40       Lab Results  Component Value Date   NA 136 04/21/2023   K 4.1 04/21/2023   CO2 23 04/21/2023   GLUCOSE 250 (H) 04/21/2023   BUN 16 04/21/2023   CREATININE 0.90 04/21/2023   CALCIUM 9.3 04/21/2023   GFR 95.94 09/27/2022   GFRNONAA >60 04/21/2023   Lab Results  Component Value Date   WBC 9.4 04/21/2023   HGB 13.1 04/21/2023   HCT 38.5 (L) 04/21/2023   MCV 100.8 (H) 04/21/2023   PLT 282 04/21/2023   Lab Results  Component Value Date   ALT 80 (H) 04/18/2023   AST 55 (H) 04/18/2023   ALKPHOS 164 (H) 04/18/2023   BILITOT 0.6 04/18/2023   Watched by Dr Leone Payor- has more labs planned   Lab Results  Component Value Date   TSH 3.35 02/01/2022   No results found for: "VITAMINB12"  05/12/2023    3:17 PM 02/08/2022    3:10 PM 11/07/2020    8:42 AM 07/13/2018    9:21 AM 05/26/2017    8:47 AM  Depression screen PHQ 2/9  Decreased Interest 2 0 0 0 1  Down, Depressed, Hopeless 2 0 0 0 1  PHQ - 2 Score 4 0 0 0 2  Altered sleeping 2 0 0  1  Tired, decreased energy 0 0 0  1  Change in appetite 0 0 0  0  Feeling bad or failure about yourself  1 0 0  0  Trouble concentrating 0 0 0  0  Moving slowly or fidgety/restless 1 0 0  0  Suicidal thoughts 0 0 0  0  PHQ-9 Score 8 0 0  4  Difficult doing work/chores Not difficult at all  Not difficult at all        05/12/2023    3:17 PM  GAD 7 : Generalized Anxiety Score  Nervous, Anxious, on Edge 1  Control/stop worrying 1  Worry too much - different things 1  Trouble relaxing 1  Restless 1  Easily annoyed or irritable 0  Afraid - awful might happen 1  Total GAD 7 Score 6  Anxiety Difficulty Not difficult at all       Patient Active Problem List   Diagnosis Date Noted   Cognitive change 05/12/2023   Hearing loss 05/12/2023   Limb ischemia 04/18/2023    Critical limb ischemia of left lower extremity (HCC) 04/18/2023   Diabetic foot infection (HCC) 04/12/2023   Onychomycosis 04/12/2023   Uncontrolled type 2 diabetes mellitus with hyperglycemia, with long-term current use of insulin (HCC) 04/12/2023   PVD (peripheral vascular disease) (HCC) 04/12/2023   Angiodysplasia of cecum 11/25/2022   Routine general medical examination at a health care facility 02/10/2022   Elevated TSH 11/07/2020   Erectile dysfunction 01/07/2020   Portal hypertensive gastropathy (HCC) 06/24/2019   Low back pain 12/29/2018   NSAID-induced gastric ulcer 12/13/2018   Pain due to onychomycosis of toenails of both feet 12/01/2018   Pre-ulcerative calluses 12/01/2018   Porokeratosis 12/01/2018   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) 08/04/2018   Hypokalemia 06/24/2018   History of alcoholic hepatitis    Alcoholic liver disease (HCC)    Former smoker 01/04/2014   Hx of adenomatous colonic polyps 03/09/2013   Prostate cancer screening 12/07/2012   Encounter for general adult medical examination with abnormal findings 08/23/2010   Hyperlipidemia    Obesity (BMI 30-39.9)    HYPERTENSION, BENIGN ESSENTIAL 01/05/2010   BACK PAIN, LUMBAR, WITH RADICULOPATHY 09/13/2008   TINEA PEDIS 07/16/2007   DM (diabetes mellitus), type 2 (HCC) 08/13/2006   Hyperlipidemia associated with type 2 diabetes mellitus (HCC) 08/13/2006   Allergy 08/13/2006   Fatty liver 08/13/2006   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) 08/04/2018   Diabetes mellitus    Type II   Hyperlipidemia    Hypertension    NSAID-induced gastric ulcer 12/13/2018   EGD 11/2018 H pylori neg Omeprazole started   Obesity    Personal history of colonic adenomas 03/09/2013   Tinea pedis    Chronic   Past Surgical History:  Procedure Laterality Date   ABDOMINAL AORTOGRAM W/LOWER EXTREMITY N/A  04/15/2023   Procedure: ABDOMINAL AORTOGRAM W/LOWER EXTREMITY;  Surgeon: Daria Pastures, MD;  Location: [facility]  INVASIVE CV LAB;  Service: Cardiovascular;  Laterality: N/A;   BYPASS GRAFT FEMORAL-PERONEAL Left 04/18/2023   Procedure: LEFT FEMORAL-PERONEAL ARTERY BYPASS;  Surgeon: Maeola Harman, MD;  Location: [facility] OR;  Service: Vascular;  Laterality: Left;   COLONOSCOPY     COLONOSCOPY W/ POLYPECTOMY  2014   IR PARACENTESIS  07/02/2018   IR TRANSCATHETER BX  07/17/2018   IR VENOGRAM HEPATIC WO HEMODYNAMIC EVALUATION  07/17/2018   myringectomy  06/2005   POLYPECTOMY     UMBILICAL HERNIA REPAIR  01/1997   VASECTOMY  07/2002   VEIN HARVEST Left 04/18/2023   Procedure: VEIN HARVEST USING GREATER SAPHENOUS VEIN;  Surgeon: Maeola Harman, MD;  Location: [facility] OR;  Service: Vascular;  Laterality: Left;   Social History   Tobacco Use   Smoking status: Light Smoker    Current packs/day: 0.25    Types: Cigarettes   Smokeless tobacco: Never  Vaping Use   Vaping status: Never Used  Substance Use Topics   Alcohol use: Yes    Comment: 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   Drug use: No   Family History  Problem Relation Age of Onset   Hypertension Other    Diabetes Neg Hx    Heart disease Neg Hx    Colon cancer Neg Hx    Stomach cancer Neg Hx    Pancreatic disease Neg Hx    Colon polyps Neg Hx    Esophageal cancer Neg Hx    Rectal cancer Neg Hx    No Known Allergies Current Outpatient Medications on File Prior to Visit  Medication Sig Dispense Refill   aspirin EC 81 MG tablet Take 1 tablet (81 mg total) by mouth daily. Swallow whole. 90 tablet 3   clopidogrel (PLAVIX) 75 MG tablet Take 1 tablet (75 mg total) by mouth daily. 90 tablet 3   clotrimazole-betamethasone (LOTRISONE) cream Apply 1 Application topically daily. 45 g 2   Continuous Glucose Sensor (DEXCOM G7 SENSOR) MISC 1 Device by Does not apply route continuous.  9 each 0   Evolocumab (REPATHA SURECLICK) 140 MG/ML SOAJ Inject 140 mg into the skin every 14 (fourteen) days. (Patient taking differently: Inject 1 mL into the skin every 14 (fourteen) days. Every other Tuesday) 6 mL 3   ezetimibe (ZETIA) 10 MG tablet Take 1 tablet (10 mg total) by mouth daily. 30 tablet 0   glucose blood (ONETOUCH VERIO) test strip 1 each by Other route daily. And lancets 1/day 100 each 3   insulin glargine (LANTUS SOLOSTAR) 100 UNIT/ML Solostar Pen Inject 10 Units into the skin daily. Plus sliding scale, max dose 50 units a day 45 mL 1   Insulin Pen Needle 31G X 8 MM MISC Inject 1 Needle into the skin daily at 6 (six) AM. 100 each 5   Semaglutide,0.25 or 0.5MG /DOS, (OZEMPIC, 0.25 OR 0.5 MG/DOSE,) 2 MG/3ML SOPN Inject 0.5 mg into the skin once a week. 3 mL 0   spironolactone (ALDACTONE) 50 MG tablet TAKE 1 TABLET(50 MG) BY MOUTH DAILY 90 tablet 1   tadalafil (CIALIS) 20 MG tablet Take 1 tablet (20 mg total) by mouth daily as needed. 60 tablet 3   thiamine 100 MG tablet Take 1 tablet (100 mg total) by mouth daily. 30 tablet 0   No current facility-administered medications on file prior to visit.    Review of Systems  Constitutional:  Positive for fatigue. Negative for activity change, appetite  change, fever and unexpected weight change.  HENT:  Negative for congestion, rhinorrhea, sore throat and trouble swallowing.   Eyes:  Negative for pain, redness, itching and visual disturbance.  Respiratory:  Negative for cough, chest tightness, shortness of breath and wheezing.   Cardiovascular:  Negative for chest pain and palpitations.  Gastrointestinal:  Negative for abdominal pain, blood in stool, constipation, diarrhea and nausea.  Endocrine: Negative for cold intolerance, heat intolerance, polydipsia and polyuria.  Genitourinary:  Negative for difficulty urinating, dysuria, frequency and urgency.  Musculoskeletal:  Negative for arthralgias, joint swelling and myalgias.  Skin:   Negative for pallor and rash.  Neurological:  Negative for dizziness, tremors, weakness, numbness and headaches.  Hematological:  Negative for adenopathy. Does not bruise/bleed easily.  Psychiatric/Behavioral:  Positive for decreased concentration and dysphoric mood. Negative for confusion. The patient is not nervous/anxious.        Objective:   Physical Exam Constitutional:      General: He is not in acute distress.    Appearance: Normal appearance. He is well-developed. He is obese. He is not ill-appearing or diaphoretic.  HENT:     Head: Normocephalic and atraumatic.  Eyes:     Conjunctiva/sclera: Conjunctivae normal.     Pupils: Pupils are equal, round, and reactive to light.  Neck:     Thyroid: No thyromegaly.     Vascular: No carotid bruit or JVD.  Cardiovascular:     Rate and Rhythm: Normal rate and regular rhythm.     Heart sounds: Normal heart sounds.     No gallop.  Pulmonary:     Effort: Pulmonary effort is normal. No respiratory distress.     Breath sounds: Normal breath sounds. No wheezing or rales.  Abdominal:     General: There is no distension or abdominal bruit.     Palpations: Abdomen is soft.     Comments: Liver edge is palpable No tenderness  Musculoskeletal:     Cervical back: Normal range of motion and neck supple.     Right lower leg: No edema.     Left lower leg: No edema.  Lymphadenopathy:     Cervical: No cervical adenopathy.  Skin:    General: Skin is warm and dry.     Coloration: Skin is not pale.     Findings: No rash.  Neurological:     Mental Status: He is alert.     Coordination: Coordination normal.     Deep Tendon Reflexes: Reflexes are normal and symmetric. Reflexes normal.  Psychiatric:        Attention and Perception: Attention normal.        Mood and Affect: Mood is depressed. Affect is flat.        Behavior: Behavior is slowed.     Comments: Seems fatigued and somewhat slowed but no obvious cognitive red flag   Candidly  discusses symptoms and stressors             Assessment & Plan:   Problem List Items Addressed This Visit       Cardiovascular and Mediastinum   HYPERTENSION, BENIGN ESSENTIAL   bp in fair control at this time  BP Readings from Last 1 Encounters:  05/12/23 124/70   No changes needed Most recent labs reviewed  Disc lifstyle change with low sodium diet and exercise  Taking spironolactone 50 mg for liver dz and this helps blood pressure also      Relevant Orders   TSH   Basic metabolic  panel   Hepatic function panel     Digestive   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)   Labs today  Will send to GI No etoh ofr 31 days   Encouraged AA      Relevant Orders   CBC with Differential/Platelet   Basic metabolic panel   Hepatic function panel   Alcoholic liver disease (HCC)   Pt is 31 days without alcohol Commended Encouraged strongly to go to AA meeting  Labs today to send to GI        Relevant Orders   Vitamin B12   Vitamin B1     Endocrine   Uncontrolled type 2 diabetes mellitus with hyperglycemia, with long-term current use of insulin (HCC)   Lantus  Semaglutide 0.5 weekly  Sees endocrinology      Hyperlipidemia associated with type 2 diabetes mellitus (HCC)   Continues repatha and zetia  Last LDL 38 With vascular dz  Disc goals for lipids and reasons to control them Rev last labs with pt Rev low sat fat diet in detail       DM (diabetes mellitus), type 2 (HCC)   Seeing endocrinology Control is not optimal  Lantus and semaglutide  Encouraged low glycemic diet  No longer drinking alcohol        Nervous and Auditory   Hearing loss   Today hearing screen is normal         Other   Elevated TSH   TSH and FT4 today  Some cognitive changes       Relevant Orders   TSH   T4, free   Cognitive change - Primary   With some short term memory issues  Likely multifactorial  Noted Alcoholism-just quit again  High stress  and elevated PHQ and GAD7 scores  Smoking-recently quit Vascular disease   Labs today  Will follow up for mmse exam and to discuss mood and cognition in more detail       Relevant Orders   CBC with Differential/Platelet   TSH   Vitamin B12   Basic metabolic panel   T4, free   Vitamin B1   Other Visit Diagnoses       Need for shingles vaccine       Relevant Orders   Zoster Recombinant (Shingrix ) (Completed)     Need for influenza vaccination       Relevant Orders   Flu vaccine trivalent PF, 6mos and older(Flulaval,Afluria,Fluarix,Fluzone) (Completed)

## 2023-05-12 NOTE — Patient Instructions (Addendum)
I highly recommend AA  Continue to avoid alcohol and smoking   Labs today for cognition and chronic health problems   Try and eat a healthy balanced diet Try to get most of your carbohydrates from produce (with the exception of white potatoes) and whole grains Eat less bread/pasta/rice/snack foods/cereals/sweets and other items from the middle of the grocery store (processed carbs)  Flu shot today Shingrix shot today   Hearing screen today   Follow up in the next month to further discuss cognitive change

## 2023-05-12 NOTE — Telephone Encounter (Signed)
Is this okay to send into the pharmacy was given meter from a different provider

## 2023-05-13 LAB — BASIC METABOLIC PANEL
BUN: 16 mg/dL (ref 6–23)
CO2: 24 meq/L (ref 19–32)
Calcium: 10 mg/dL (ref 8.4–10.5)
Chloride: 101 meq/L (ref 96–112)
Creatinine, Ser: 0.71 mg/dL (ref 0.40–1.50)
GFR: 97.5 mL/min (ref >60.00)
Glucose, Bld: 167 mg/dL — ABNORMAL HIGH (ref 70–99)
Potassium: 4.3 meq/L (ref 3.5–5.1)
Sodium: 136 meq/L (ref 135–145)

## 2023-05-13 LAB — CBC WITH DIFFERENTIAL/PLATELET
Basophils Absolute: 0.1 10*3/uL (ref 0.0–0.1)
Basophils Relative: 1.1 % (ref 0.0–3.0)
Eosinophils Absolute: 0.4 10*3/uL (ref 0.0–0.7)
Eosinophils Relative: 6.2 % — ABNORMAL HIGH (ref 0.0–5.0)
HCT: 38.3 % — ABNORMAL LOW (ref 39.0–52.0)
Hemoglobin: 13 g/dL (ref 13.0–17.0)
Lymphocytes Relative: 31.2 % (ref 12.0–46.0)
Lymphs Abs: 2 10*3/uL (ref 0.7–4.0)
MCHC: 33.9 g/dL (ref 30.0–36.0)
MCV: 101 fL — ABNORMAL HIGH (ref 78.0–100.0)
Monocytes Absolute: 0.7 10*3/uL (ref 0.1–1.0)
Monocytes Relative: 10.6 % (ref 3.0–12.0)
Neutro Abs: 3.2 10*3/uL (ref 1.4–7.7)
Neutrophils Relative %: 50.9 % (ref 43.0–77.0)
Platelets: 317 10*3/uL (ref 150.0–400.0)
RBC: 3.79 Mil/uL — ABNORMAL LOW (ref 4.22–5.81)
RDW: 13.4 % (ref 11.5–15.5)
WBC: 6.3 10*3/uL (ref 4.0–10.5)

## 2023-05-13 LAB — TSH: TSH: 2.71 u[IU]/mL (ref 0.35–5.50)

## 2023-05-13 LAB — T4, FREE: Free T4: 0.58 ng/dL — ABNORMAL LOW (ref 0.60–1.60)

## 2023-05-13 LAB — HEPATIC FUNCTION PANEL
ALT: 44 U/L (ref 0–53)
AST: 28 U/L (ref 0–37)
Albumin: 4.5 g/dL (ref 3.5–5.2)
Alkaline Phosphatase: 200 U/L — ABNORMAL HIGH (ref 39–117)
Bilirubin, Direct: 0.1 mg/dL (ref 0.0–0.3)
Total Bilirubin: 0.3 mg/dL (ref 0.2–1.2)
Total Protein: 7.5 g/dL (ref 6.0–8.3)

## 2023-05-13 LAB — VITAMIN B12: Vitamin B-12: 843 pg/mL (ref 211–911)

## 2023-05-13 NOTE — Assessment & Plan Note (Signed)
Continues repatha and zetia  Last LDL 38 With vascular dz  Disc goals for lipids and reasons to control them Rev last labs with pt Rev low sat fat diet in detail

## 2023-05-13 NOTE — Assessment & Plan Note (Signed)
Seeing endocrinology Control is not optimal  Lantus and semaglutide  Encouraged low glycemic diet  No longer drinking alcohol

## 2023-05-13 NOTE — Assessment & Plan Note (Signed)
TSH and FT4 today  Some cognitive changes

## 2023-05-13 NOTE — Assessment & Plan Note (Signed)
Pt is 31 days without alcohol Commended Encouraged strongly to go to [employer] today to send to GI

## 2023-05-13 NOTE — Assessment & Plan Note (Signed)
bp in fair control at this time  BP Readings from Last 1 Encounters:  05/12/23 124/70   No changes needed Most recent labs reviewed  Disc lifstyle change with low sodium diet and exercise  Taking spironolactone 50 mg for liver dz and this helps blood pressure also

## 2023-05-13 NOTE — Assessment & Plan Note (Signed)
With some short term memory issues  Likely multifactorial  Noted Alcoholism-just quit again  High stress and elevated PHQ and GAD7 scores  Smoking-recently quit Vascular disease   Labs today  Will follow up for mmse exam and to discuss mood and cognition in more detail

## 2023-05-13 NOTE — Assessment & Plan Note (Signed)
Labs today  Will send to GI No etoh ofr 31 days   Encouraged AA

## 2023-05-13 NOTE — Assessment & Plan Note (Signed)
Lantus  Semaglutide 0.5 weekly  Sees endocrinology

## 2023-05-13 NOTE — Assessment & Plan Note (Signed)
Today hearing screen is normal

## 2023-05-15 ENCOUNTER — Encounter: Payer: Self-pay | Admitting: "Endocrinology

## 2023-05-15 LAB — VITAMIN B1: Vitamin B1 (Thiamine): 44 nmol/L — ABNORMAL HIGH (ref 8–30)

## 2023-05-16 ENCOUNTER — Other Ambulatory Visit (HOSPITAL_COMMUNITY): Payer: Self-pay

## 2023-05-16 ENCOUNTER — Other Ambulatory Visit: Payer: Self-pay

## 2023-05-16 DIAGNOSIS — I70222 Atherosclerosis of native arteries of extremities with rest pain, left leg: Secondary | ICD-10-CM

## 2023-05-28 ENCOUNTER — Encounter: Payer: Self-pay | Admitting: Podiatry

## 2023-05-28 ENCOUNTER — Ambulatory Visit (INDEPENDENT_AMBULATORY_CARE_PROVIDER_SITE_OTHER): Payer: No Typology Code available for payment source | Admitting: Podiatry

## 2023-05-28 DIAGNOSIS — E0843 Diabetes mellitus due to underlying condition with diabetic autonomic (poly)neuropathy: Secondary | ICD-10-CM | POA: Diagnosis not present

## 2023-05-28 DIAGNOSIS — L97522 Non-pressure chronic ulcer of other part of left foot with fat layer exposed: Secondary | ICD-10-CM | POA: Diagnosis not present

## 2023-05-28 DIAGNOSIS — D2372 Other benign neoplasm of skin of left lower limb, including hip: Secondary | ICD-10-CM | POA: Diagnosis not present

## 2023-05-28 DIAGNOSIS — B353 Tinea pedis: Secondary | ICD-10-CM

## 2023-05-28 MED ORDER — CLOTRIMAZOLE-BETAMETHASONE 1-0.05 % EX CREA: 1.0000 | TOPICAL_CREAM | Freq: Every day | CUTANEOUS | 3 refills | Status: DC

## 2023-05-28 NOTE — Progress Notes (Signed)
 Chief Complaint  Patient presents with   Routine Post Op    Patient states everything is going good since last visit .    SUBJECTIVE Patient presenting for follow-up regarding recent admission to the hospital.  He underwent vascular intervention to improve his circulation to the lower extremities.  He has developed ischemic ulcers to the distal tips of the left great toe as well as the left fifth digit.  Patient is here for further evaluation and treatment.  He has been applying Betadine daily to the wounds with improvement.  Overall he feels significantly better.  He has been out of work since last visit.  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 Derm overall significant improvement.  The wound to the fifth toe is completely resolved and healed.  Wound persist to the left great toe measuring approximately 1.5 x 1.5 x 0.1 cm.  Well adhered superficial scar noted.  It appears very stable with no drainage.  No exposed bone.  Clinically no indication of infection Symptomatic calluses also noted to the bilateral feet. Vasc  PERIPHERAL VASCULAR CATHETERIZATION 04/15/2023 Findings: Widely patent infrarenal aorta and bilateral renal arteries.  Widely patent bilateral iliac systems.  Widely patent left common femoral and profunda arteries.  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  The popliteal artery is occluded the peroneal artery is reconstituted distally which fills the DP on the foot.  The PT is  chronically occluded and does not appear to reconstitute.   Neuro light touch and protective threshold sensation diminished bilaterally.   Musculoskeletal Exam No symptomatic pedal deformities noted bilateral. Muscular strength within normal limits.  Patient ambulatory.  No prior amputations  ASSESSMENT 1. 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  Ulcer left great toe and fifth digit secondary to diabetes and peripheral limb ischemia 3.  Eccrine poroma/symptomatic calluses bilateral feet 4.  Chronic tinea pedis left  PLAN OF CARE -Patient evaluated today.   - Medically necessary excisional debridement including subcutaneous tissue was performed today using a tissue nipper.  Excisional debridement of the necrotic nonviable tissue down to healthier bleeding viable tissue was performed with postoperative measurement signs. -Mechanical debridement of nails 1-5 bilaterally performed using a nail nipper. Filed with dremel without incident.  -Excisional debridement of the hyperkeratotic skin lesions was performed today using a 312 scalpel without incident or bleeding.   -Continue Betadine application to the left great toe since this seems to be improving and the wound is stable -Diabetic shoes pending.  Order was placed last visit -Refill prescription for Lotrisone  cream.  Apply to the skin daily -Planning to return to work 06/09/2023.  Return to clinic approximately 1 week after  *Works at [employer] x 20 years  Thresa EMERSON Sar, DPM [facility]  Dr. Thresa EMERSON Sar, DPM    [address]  [place], [place] [address]                Office [phone]  Fax [phone]

## 2023-05-30 ENCOUNTER — Ambulatory Visit: Payer: No Typology Code available for payment source | Admitting: Family Medicine

## 2023-06-02 ENCOUNTER — Encounter: Payer: Self-pay | Admitting: Family Medicine

## 2023-06-02 ENCOUNTER — Ambulatory Visit (INDEPENDENT_AMBULATORY_CARE_PROVIDER_SITE_OTHER): Payer: No Typology Code available for payment source | Admitting: Family Medicine

## 2023-06-02 VITALS — BP 134/86 | HR 85 | Temp 97.7°F | Ht 66.0 in | Wt 199.0 lb

## 2023-06-02 DIAGNOSIS — K709 Alcoholic liver disease, unspecified: Secondary | ICD-10-CM | POA: Diagnosis not present

## 2023-06-02 DIAGNOSIS — K766 Portal hypertension: Secondary | ICD-10-CM

## 2023-06-02 DIAGNOSIS — R7989 Other specified abnormal findings of blood chemistry: Secondary | ICD-10-CM | POA: Insufficient documentation

## 2023-06-02 DIAGNOSIS — R4189 Other symptoms and signs involving cognitive functions and awareness: Secondary | ICD-10-CM

## 2023-06-02 DIAGNOSIS — K3189 Other diseases of stomach and duodenum: Secondary | ICD-10-CM

## 2023-06-02 LAB — T3, FREE: T3, Free: 3.7 pg/mL (ref 2.3–4.2)

## 2023-06-02 LAB — TSH: TSH: 3.58 u[IU]/mL (ref 0.35–5.50)

## 2023-06-02 LAB — T4, FREE: Free T4: 0.66 ng/dL (ref 0.60–1.60)

## 2023-06-02 NOTE — Assessment & Plan Note (Signed)
 Per pt- feels mentally clearer now after stopping etoh  Thinks memory and cognition are back to baseline  Also mood is good  Improved exam today   Did MMSE and scored 29/30 (for stage command took paper in wrong hand- he is left handed however) Normal clock draw  Overall reassuring and we can watch it  Encouraged follow up if he feels he is struggling   Will follow up with his counselor for alcohol  as well

## 2023-06-02 NOTE — Assessment & Plan Note (Signed)
 No etoh for 51 days Continue GI follow up

## 2023-06-02 NOTE — Patient Instructions (Addendum)
 You did well on the memory today  Your score on the mini mental status test was excellent and in normal range  I'm glad memory and cognition are improved  I suspect alcohol  was the cause of trouble last time   Continue to abstain from alcohol   Continue the counseling   Eat a balanced diet  Stay hydrated  Keep exercising   Let us  know if any symptoms worsen again and we will re evaluate  Today - we will repeat some thyroid  tests (one number was slightly off last time)

## 2023-06-02 NOTE — Assessment & Plan Note (Signed)
 Repeat this with TSH Also FT3, TPO and thyroglobulin   If TSH normal and FT4 continues to be low may need to look for pituitary issue   No clinical changes (feel better than last visit) Does not take biotin

## 2023-06-02 NOTE — Assessment & Plan Note (Signed)
 Last labs sent to Dr [place] [facility] phos still elevated  Transaminases normal   No alcohol in 51 days and feeling better

## 2023-06-02 NOTE — Progress Notes (Signed)
 Subjective:    Patient ID: Zachary Lewis, male    DOB: 1961/01/17, 63 y.o.   MRN: [id number]  HPI  Wt Readings from Last 3 Encounters:  06/02/23 199 lb (90.3 kg)  05/12/23 196 lb (88.9 kg)  05/09/23 191 lb 6.4 oz (86.8 kg)   32.12 kg/m  Vitals:   06/02/23 1051  BP: 134/86  Pulse: 85  Temp: 97.7 F (36.5 C)  SpO2: 99%   Pt presents for follow up of etoh liver dz and cognitive change   Last visit no etoh for 31 days  We highly recommended AA  Hearing screen was normal   Did labs   Office Visit on 05/12/2023  Component Date Value Ref Range Status   WBC 05/12/2023 6.3  4.0 - 10.5 K/uL Final   RBC 05/12/2023 3.79 (L)  4.22 - 5.81 Mil/uL Final   Hemoglobin 05/12/2023 13.0  13.0 - 17.0 g/dL Final   HCT 87/76/7975 38.3 (L)  39.0 - 52.0 % Final   MCV 05/12/2023 101.0 (H)  78.0 - 100.0 fl Final   MCHC 05/12/2023 33.9  30.0 - 36.0 g/dL Final   RDW 87/76/7975 13.4  11.5 - 15.5 % Final   Platelets 05/12/2023 317.0  150.0 - 400.0 K/uL Final   Neutrophils Relative % 05/12/2023 50.9  43.0 - 77.0 % Final   Lymphocytes Relative 05/12/2023 31.2  12.0 - 46.0 % Final   Monocytes Relative 05/12/2023 10.6  3.0 - 12.0 % Final   Eosinophils Relative 05/12/2023 6.2 (H)  0.0 - 5.0 % Final   Basophils Relative 05/12/2023 1.1  0.0 - 3.0 % Final   Neutro Abs 05/12/2023 3.2  1.4 - 7.7 K/uL Final   Lymphs Abs 05/12/2023 2.0  0.7 - 4.0 K/uL Final   Monocytes Absolute 05/12/2023 0.7  0.1 - 1.0 K/uL Final   Eosinophils Absolute 05/12/2023 0.4  0.0 - 0.7 K/uL Final   Basophils Absolute 05/12/2023 0.1  0.0 - 0.1 K/uL Final   TSH 05/12/2023 2.71  0.35 - 5.50 uIU/mL Final   Vitamin B-12 05/12/2023 843  211 - 911 pg/mL Final   Sodium 05/12/2023 136  135 - 145 mEq/L Final   Potassium 05/12/2023 4.3  3.5 - 5.1 mEq/L Final   Chloride 05/12/2023 101  96 - 112 mEq/L Final   CO2 05/12/2023 24  19 - 32 mEq/L Final   Glucose, Bld 05/12/2023 167 (H)  70 - 99 mg/dL Final   BUN 87/76/7975 16  6 - 23  mg/dL Final   Creatinine, Ser 05/12/2023 0.71  0.40 - 1.50 mg/dL Final   GFR 87/76/7975 97.50  >60.00 mL/min Final   Calculated using the CKD-EPI Creatinine Equation (2021)   Calcium 05/12/2023 10.0  8.4 - 10.5 mg/dL Final   Total Bilirubin 05/12/2023 0.3  0.2 - 1.2 mg/dL Final   Bilirubin, Direct 05/12/2023 0.1  0.0 - 0.3 mg/dL Final   Alkaline Phosphatase 05/12/2023 200 (H)  39 - 117 U/L Final   AST 05/12/2023 28  0 - 37 U/L Final   ALT 05/12/2023 44  0 - 53 U/L Final   Total Protein 05/12/2023 7.5  6.0 - 8.3 g/dL Final   Albumin 87/76/7975 4.5  3.5 - 5.2 g/dL Final   Free T4 87/76/7975 0.58 (L)  0.60 - 1.60 ng/dL Final   Comment: Specimens from patients who are undergoing biotin therapy and /or ingesting biotin supplements may contain high levels of biotin.  The higher biotin concentration in these specimens interferes with this  Free T4 assay.  Specimens that contain high levels  of biotin may cause false high results for this Free T4 assay.  Please interpret results in light of the total clinical presentation of the patient.     Vitamin B1 (Thiamine ) 05/12/2023 44 (H)  8 - 30 nmol/L Final   Comment: (Note) Vitamin supplementation within 24 hours prior to blood  draw may affect the accuracy of the results. . This test was developed and its analytical performance  characteristics have been determined by [employer]. It has not been cleared or approved by FDA.  This assay has been validated pursuant to the CLIA  regulations and is used for clinical purposes. . [facility] [address] [place] [address] [phone] [name], MD, PhD      Discussed some cognitive change with short term memory issues  In setting of high stress  Known vascular dz   Free T4 slightly low   Sent liver labs to Dr Avram / GI  Doing a lot better  Walking on treadmill  Also bike  Working back up to an hour - about 30 minutes now   Feels  like he cleared up mentally  Cognitive status and short term memory is better Still no alcohol  at all  51 days today -excited  Had meeting with a therapist this past Thursday  Has someone to call if he needs help   Energy level is much better  Sleep is good   Foot is healing well        06/02/2023   11:00 AM 05/12/2023    3:17 PM 02/08/2022    3:10 PM 11/07/2020    8:42 AM 07/13/2018    9:21 AM  Depression screen PHQ 2/9  Decreased Interest 0 2 0 0 0  Down, Depressed, Hopeless 0 2 0 0 0  PHQ - 2 Score 0 4 0 0 0  Altered sleeping 0 2 0 0   Tired, decreased energy 0 0 0 0   Change in appetite 0 0 0 0   Feeling bad or failure about yourself  0 1 0 0   Trouble concentrating 0 0 0 0   Moving slowly or fidgety/restless 0 1 0 0   Suicidal thoughts 0 0 0 0   PHQ-9 Score 0 8 0 0   Difficult doing work/chores Not difficult at all Not difficult at all  Not difficult at all       06/02/2023   11:00 AM 05/12/2023    3:17 PM  GAD 7 : Generalized Anxiety Score  Nervous, Anxious, on Edge 0 1  Control/stop worrying 0 1  Worry too much - different things 0 1  Trouble relaxing 0 1  Restless 0 1  Easily annoyed or irritable 0 0  Afraid - awful might happen 0 1  Total GAD 7 Score 0 6  Anxiety Difficulty Not difficult at all Not difficult at all    Mood is great  More active and taking care of himself      Patient Active Problem List   Diagnosis Date Noted   Low T4 06/02/2023   Cognitive change 05/12/2023   Hearing loss 05/12/2023   Limb ischemia 04/18/2023   Critical limb ischemia of left lower extremity (HCC) 04/18/2023   Diabetic foot infection (HCC) 04/12/2023   Onychomycosis 04/12/2023   Uncontrolled type 2 diabetes mellitus with hyperglycemia, with long-term current use of insulin  (HCC) 04/12/2023   PVD (  peripheral vascular disease) (HCC) 04/12/2023   Angiodysplasia of cecum 11/25/2022   Routine general medical examination at a health care facility 02/10/2022    Elevated TSH 11/07/2020   Erectile dysfunction 01/07/2020   Portal hypertensive gastropathy (HCC) 06/24/2019   Low back pain 12/29/2018   NSAID-induced gastric ulcer 12/13/2018   Pain due to onychomycosis of toenails of both feet 12/01/2018   Pre-ulcerative calluses 12/01/2018   Porokeratosis 12/01/2018   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) 08/04/2018   Hypokalemia 06/24/2018   History of alcoholic hepatitis    Alcoholic liver disease (HCC)    Former smoker 01/04/2014   Hx of adenomatous colonic polyps 03/09/2013   Prostate cancer screening 12/07/2012   Encounter for general adult medical examination with abnormal findings 08/23/2010   Hyperlipidemia    Obesity (BMI 30-39.9)    HYPERTENSION, BENIGN ESSENTIAL 01/05/2010   BACK PAIN, LUMBAR, WITH RADICULOPATHY 09/13/2008   TINEA PEDIS 07/16/2007   DM (diabetes mellitus), type 2 (HCC) 08/13/2006   Hyperlipidemia associated with type 2 diabetes mellitus (HCC) 08/13/2006   Allergy 08/13/2006   Fatty liver 08/13/2006   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) 08/04/2018   Diabetes mellitus    Type II   Hyperlipidemia    Hypertension    NSAID-induced gastric ulcer 12/13/2018   EGD 11/2018 H pylori neg Omeprazole  started   Obesity    Personal history of colonic adenomas 03/09/2013   Tinea pedis    Chronic   Past Surgical History:  Procedure Laterality Date   ABDOMINAL AORTOGRAM W/LOWER EXTREMITY N/A 04/15/2023   Procedure: ABDOMINAL AORTOGRAM W/LOWER EXTREMITY;  Surgeon: Pearline Norman RAMAN, MD;  Location: [facility];  Service: Cardiovascular;  Laterality: N/A;   BYPASS GRAFT FEMORAL-PERONEAL Left 04/18/2023   Procedure: LEFT FEMORAL-PERONEAL ARTERY BYPASS;  Surgeon: Sheree Penne Bruckner, MD;  Location: [facility];  Service: Vascular;  Laterality: Left;   COLONOSCOPY     COLONOSCOPY W/ POLYPECTOMY  2014   IR  PARACENTESIS  07/02/2018   IR TRANSCATHETER BX  07/17/2018   IR VENOGRAM HEPATIC WO HEMODYNAMIC EVALUATION  07/17/2018   myringectomy  06/2005   POLYPECTOMY     UMBILICAL HERNIA REPAIR  01/1997   VASECTOMY  07/2002   VEIN HARVEST Left 04/18/2023   Procedure: VEIN HARVEST USING GREATER SAPHENOUS VEIN;  Surgeon: Sheree Penne Bruckner, MD;  Location: [facility] OR;  Service: Vascular;  Laterality: Left;   Social History   Tobacco Use   Smoking status: Light Smoker    Current packs/day: 0.25    Types: Cigarettes   Smokeless tobacco: Never  Vaping Use   Vaping status: Never Used  Substance Use Topics   Alcohol  use: Yes    Comment: 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   Drug use: No   Family History  Problem Relation Age of Onset   Hypertension Other    Diabetes Neg Hx    Heart disease Neg Hx    Colon cancer Neg Hx    Stomach cancer Neg Hx    Pancreatic disease Neg Hx    Colon polyps Neg Hx    Esophageal cancer Neg Hx    Rectal cancer Neg Hx    No Known Allergies Current Outpatient Medications on File Prior to Visit  Medication Sig Dispense Refill   aspirin   EC 81 MG tablet Take 1 tablet (81 mg total) by mouth daily. Swallow whole. 90 tablet 3   clopidogrel  (PLAVIX ) 75 MG tablet Take 1 tablet (75 mg total) by mouth daily. 90 tablet 3   clotrimazole -betamethasone  (LOTRISONE ) cream Apply 1 Application topically daily. 45 g 3   Continuous Glucose Sensor (DEXCOM G7 SENSOR) MISC 1 Device by Does not apply route continuous. 9 each 0   Evolocumab  (REPATHA  SURECLICK) 140 MG/ML SOAJ Inject 140 mg into the skin every 14 (fourteen) days. (Patient taking differently: Inject 1 mL into the skin every 14 (fourteen) days. Every other Tuesday) 6 mL 3   ezetimibe  (ZETIA ) 10 MG tablet Take 1 tablet (10 mg total) by mouth daily. 30 tablet 0   glucose blood (ONETOUCH VERIO) test strip 1 each by Other route daily. And lancets 1/day 100 each 3   insulin   glargine (LANTUS  SOLOSTAR) 100 UNIT/ML Solostar Pen Inject 10 Units into the skin daily. Plus sliding scale, max dose 50 units a day 45 mL 1   Insulin  Pen Needle 31G X 8 MM MISC Inject 1 Needle into the skin daily at 6 (six) AM. 100 each 5   Lancets (ONETOUCH ULTRASOFT) lancets Use as instructed  ,1 needle  insert to lancet device to check blood glucose. 100 each 12   Semaglutide ,0.25 or 0.5MG /DOS, (OZEMPIC , 0.25 OR 0.5 MG/DOSE,) 2 MG/3ML SOPN Inject 0.5 mg into the skin once a week. 3 mL 0   spironolactone  (ALDACTONE ) 50 MG tablet TAKE 1 TABLET(50 MG) BY MOUTH DAILY 90 tablet 1   tadalafil  (CIALIS ) 20 MG tablet Take 1 tablet (20 mg total) by mouth daily as needed. 60 tablet 3   thiamine  100 MG tablet Take 1 tablet (100 mg total) by mouth daily. 30 tablet 0   No current facility-administered medications on file prior to visit.    Review of Systems  Constitutional:  Negative for activity change, appetite change, fatigue, fever and unexpected weight change.  HENT:  Negative for congestion, rhinorrhea, sore throat and trouble swallowing.   Eyes:  Negative for pain, redness, itching and visual disturbance.  Respiratory:  Negative for cough, chest tightness, shortness of breath and wheezing.   Cardiovascular:  Negative for chest pain and palpitations.  Gastrointestinal:  Negative for abdominal pain, blood in stool, constipation, diarrhea and nausea.  Endocrine: Negative for cold intolerance, heat intolerance, polydipsia and polyuria.  Genitourinary:  Negative for difficulty urinating, dysuria, frequency and urgency.  Musculoskeletal:  Negative for arthralgias, joint swelling and myalgias.  Skin:  Negative for pallor and rash.  Neurological:  Negative for dizziness, tremors, weakness, numbness and headaches.  Hematological:  Negative for adenopathy. Does not bruise/bleed easily.  Psychiatric/Behavioral:  Negative for confusion, decreased concentration and dysphoric mood. The patient is not  nervous/anxious.        Feels good  Mood is good  Denies cognitive or memory issues currently  Overall much improved        Objective:   Physical Exam Constitutional:      General: He is not in acute distress.    Appearance: Normal appearance. He is obese. He is not ill-appearing or diaphoretic.  Eyes:     General: No scleral icterus.    Conjunctiva/sclera: Conjunctivae normal.     Pupils: Pupils are equal, round, and reactive to light.  Cardiovascular:     Rate and Rhythm: Normal rate and regular rhythm.     Heart sounds: Normal heart sounds.  Pulmonary:     Effort: Pulmonary  effort is normal. No respiratory distress.  Musculoskeletal:     Cervical back: Neck supple.  Lymphadenopathy:     Cervical: No cervical adenopathy.  Skin:    Coloration: Skin is not jaundiced.  Neurological:     Mental Status: He is alert.     Cranial Nerves: No cranial nerve deficit.     Motor: No weakness.     Gait: Gait normal.  Psychiatric:        Mood and Affect: Mood normal.     Comments: Mentally sharp today  MMSE 29/30 today   Candidly discusses symptoms and stressors             Assessment & Plan:   Problem List Items Addressed This Visit       Digestive   Alcoholic liver disease (HCC)   Last labs sent to Dr Avram [place] phos still elevated  Transaminases normal   No alcohol  in 51 days and feeling better        Other   Low T4   Repeat this with TSH Also FT3, TPO and thyroglobulin   If TSH normal and FT4 continues to be low may need to look for pituitary issue   No clinical changes (feel better than last visit) Does not take biotin      Relevant Orders   TSH   T4, free   T3, Free   Anti-TPO Ab (RDL)   Thyroglobulin antibody   Cognitive change - Primary   Per pt- feels mentally clearer now after stopping etoh  Thinks memory and cognition are back to baseline  Also mood is good  Improved exam today   Did MMSE and scored 29/30 (for stage command took paper  in wrong hand- he is left handed however) Normal clock draw  Overall reassuring and we can watch it  Encouraged follow up if he feels he is struggling   Will follow up with his counselor for alcohol  as well

## 2023-06-03 ENCOUNTER — Encounter: Payer: Self-pay | Admitting: "Endocrinology

## 2023-06-03 ENCOUNTER — Ambulatory Visit (INDEPENDENT_AMBULATORY_CARE_PROVIDER_SITE_OTHER): Payer: No Typology Code available for payment source | Admitting: "Endocrinology

## 2023-06-03 VITALS — BP 118/80 | Resp 20 | Ht 66.0 in | Wt 202.8 lb

## 2023-06-03 DIAGNOSIS — E1165 Type 2 diabetes mellitus with hyperglycemia: Secondary | ICD-10-CM

## 2023-06-03 DIAGNOSIS — Z794 Long term (current) use of insulin: Secondary | ICD-10-CM | POA: Diagnosis not present

## 2023-06-03 DIAGNOSIS — E7800 Pure hypercholesterolemia, unspecified: Secondary | ICD-10-CM

## 2023-06-03 DIAGNOSIS — Z7985 Long-term (current) use of injectable non-insulin antidiabetic drugs: Secondary | ICD-10-CM

## 2023-06-03 LAB — POCT GLYCOSYLATED HEMOGLOBIN (HGB A1C): Hemoglobin A1C: 8.3 % — AB (ref 4.0–5.6)

## 2023-06-03 MED ORDER — LANTUS SOLOSTAR 100 UNIT/ML ~~LOC~~ SOPN: 28.0000 [IU] | PEN_INJECTOR | Freq: Every day | SUBCUTANEOUS | 1 refills | Status: DC

## 2023-06-03 MED ORDER — SEMAGLUTIDE (1 MG/DOSE) 4 MG/3ML ~~LOC~~ SOPN: 1.0000 mg | PEN_INJECTOR | SUBCUTANEOUS | 0 refills | Status: DC

## 2023-06-03 NOTE — Progress Notes (Signed)
 Outpatient Endocrinology Note Zachary Birmingham, MD  06/03/23   Zachary Lewis 1961/05/02 [id number]  Referring Provider: Randeen Laine LABOR, MD Primary Care Provider: Randeen Laine LABOR, MD Reason for consultation: Subjective   Assessment & Plan  Diagnoses and all orders for this visit:  Uncontrolled type 2 diabetes mellitus with hyperglycemia (HCC) -     POCT glycosylated hemoglobin (Hb A1C)  Long-term (current) use of injectable non-insulin  antidiabetic drugs  Long-term insulin  use (HCC)  Pure hypercholesterolemia  Other orders -     Semaglutide , 1 MG/DOSE, 4 MG/3ML SOPN; Inject 1 mg as directed once a week. -     insulin  glargine (LANTUS  SOLOSTAR) 100 UNIT/ML Solostar Pen; Inject 28 Units into the skin daily. Plus sliding scale, max dose 50 units a day   Diabetes complicated by neuropathy Hba1c goal less than 7.0, last Hba1c was 8.3. Will recommend for the following change of medications to: Lantus  28 units once every morning Increase to ozempic  1 mg/wk On dexcom  Prior diarrhea with metformin  Not interested in synjardy or Metformin  XR  No known contraindications to any of above medications No history of MEN syndrome/medullary thyroid  cancer/pancreatitis or pancreatic cancer in self or family  Hyperlipidemia -04/2023: Last LDL improved from 150 to 38 -not on statin -On repatha  140 mg every 2 wks, stop ezetimibe  10 mg every day  -Follow low fat diet and exercise   -Blood pressure goal <140/90 - Microalbumin/creatinine at goal < 30 three yrs ago-did not do lab, reordered  -not on ACE/ARB  -diet changes including salt restriction -limit eating outside -counseled BP targets per standards of diabetes care -Uncontrolled blood pressure can lead to retinopathy, nephropathy and cardiovascular and atherosclerotic heart disease  Reviewed and counseled on: -A1C target -Blood sugar targets -Complications of uncontrolled diabetes  -Checking blood sugar before meals and  bedtime and bring log next visit -All medications with mechanism of action and side effects -Hypoglycemia management: rule of 15's, Glucagon Emergency Kit and medical alert ID -low-carb low-fat plate-method diet -At least 20 minutes of physical activity per day -Annual dilated retinal eye exam and foot exam -compliance and follow up needs -follow up as scheduled or earlier if problem gets worse  Call if blood sugar is less than 70 or consistently above 250    Take a 15 gm snack of carbohydrate at bedtime before you go to sleep if your blood sugar is less than 100.    If you are going to fast after midnight for a test or procedure, ask your physician for instructions on how to reduce/decrease your insulin  dose.    Call if blood sugar is less than 70 or consistently above 250  -Treating a low sugar by rule of 15  (15 gms of sugar every 15 min until sugar is more than 70) If you feel your sugar is low, test your sugar to be sure If your sugar is low (less than 70), then take 15 grams of a fast acting Carbohydrate (3-4 glucose tablets or glucose gel or 4 ounces of juice or regular soda) Recheck your sugar 15 min after treating low to make sure it is more than 70 If sugar is still less than 70, treat again with 15 grams of carbohydrate          Don't drive the hour of hypoglycemia  If unconscious/unable to eat or drink by mouth, use glucagon injection or nasal spray baqsimi and call 911. Can repeat again in 15 min if still  unconscious.  Return in about 27 days (around 06/30/2023).   I have reviewed current medications, nurse's notes, allergies, vital signs, past medical and surgical history, family medical history, and social history for this encounter. Counseled patient on symptoms, examination findings, lab findings, imaging results, treatment decisions and monitoring and prognosis. The patient understood the recommendations and agrees with the treatment plan. All questions regarding  treatment plan were fully answered.  Zachary Birmingham, MD  06/03/23   History of Present Illness Zachary Lewis is a 63 y.o. year old male who presents for follow up of Type 2 diabetes mellitus.  Zachary Lewis was first diagnosed in 2010.   Diabetes education +  Home diabetes regimen: Lantus  25 units once every morning Ozempic  0.5 mg/wk  Took rybelsus  in the past  COMPLICATIONS -  MI/Stroke -  retinopathym +  neuropathy -  nephropathy  BLOOD SUGAR DATA  CGM interpretation: At today's visit, we reviewed her CGM downloads. The full report is scanned in the media. Reviewing the CGM trends, BG are mildly elevated across the day.  Physical Exam  BP 118/80 (BP Location: Left Arm, Patient Position: Sitting, Cuff Size: Large)   Resp 20   Ht 5' 6 (1.676 m)   Wt 202 lb 12.8 oz (92 kg)   BMI 32.73 kg/m    Constitutional: well developed, well nourished Head: normocephalic, atraumatic Eyes: sclera anicteric, no redness Neck: supple Lungs: normal respiratory effort Neurology: alert and oriented Skin: dry, no appreciable rashes Musculoskeletal: no appreciable defects Psychiatric: normal mood and affect Diabetic Foot Exam - Simple   No data filed      Current Medications Patient's Medications  New Prescriptions   SEMAGLUTIDE , 1 MG/DOSE, 4 MG/3ML SOPN    Inject 1 mg as directed once a week.  Previous Medications   ASPIRIN  EC 81 MG TABLET    Take 1 tablet (81 mg total) by mouth daily. Swallow whole.   CLOPIDOGREL  (PLAVIX ) 75 MG TABLET    Take 1 tablet (75 mg total) by mouth daily.   CLOTRIMAZOLE -BETAMETHASONE  (LOTRISONE ) CREAM    Apply 1 Application topically daily.   CONTINUOUS GLUCOSE SENSOR (DEXCOM G7 SENSOR) MISC    1 Device by Does not apply route continuous.   EVOLOCUMAB  (REPATHA  SURECLICK) 140 MG/ML SOAJ    Inject 140 mg into the skin every 14 (fourteen) days.   EZETIMIBE  (ZETIA ) 10 MG TABLET    Take 1 tablet (10 mg total) by mouth daily.   GLUCOSE BLOOD (ONETOUCH  VERIO) TEST STRIP    1 each by Other route daily. And lancets 1/day   INSULIN  PEN NEEDLE 31G X 8 MM MISC    Inject 1 Needle into the skin daily at 6 (six) AM.   LANCETS (ONETOUCH ULTRASOFT) LANCETS    Use as instructed  ,1 needle  insert to lancet device to check blood glucose.   SPIRONOLACTONE  (ALDACTONE ) 50 MG TABLET    TAKE 1 TABLET(50 MG) BY MOUTH DAILY   TADALAFIL  (CIALIS ) 20 MG TABLET    Take 1 tablet (20 mg total) by mouth daily as needed.   THIAMINE  100 MG TABLET    Take 1 tablet (100 mg total) by mouth daily.  Modified Medications   Modified Medication Previous Medication   INSULIN  GLARGINE (LANTUS  SOLOSTAR) 100 UNIT/ML SOLOSTAR PEN insulin  glargine (LANTUS  SOLOSTAR) 100 UNIT/ML Solostar Pen      Inject 28 Units into the skin daily. Plus sliding scale, max dose 50 units a day    Inject 10  Units into the skin daily. Plus sliding scale, max dose 50 units a day  Discontinued Medications   SEMAGLUTIDE ,0.25 OR 0.5MG /DOS, (OZEMPIC , 0.25 OR 0.5 MG/DOSE,) 2 MG/3ML SOPN    Inject 0.5 mg into the skin once a week.    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) 08/04/2018   Diabetes mellitus    Type II   Hyperlipidemia    Hypertension    NSAID-induced gastric ulcer 12/13/2018   EGD 11/2018 H pylori neg Omeprazole  started   Obesity    Personal history of colonic adenomas 03/09/2013   Tinea pedis    Chronic    Past Surgical History Past Surgical History:  Procedure Laterality Date   ABDOMINAL AORTOGRAM W/LOWER EXTREMITY N/A 04/15/2023   Procedure: ABDOMINAL AORTOGRAM W/LOWER EXTREMITY;  Surgeon: Pearline Norman RAMAN, MD;  Location: [facility];  Service: Cardiovascular;  Laterality: N/A;   BYPASS GRAFT FEMORAL-PERONEAL Left 04/18/2023   Procedure: LEFT FEMORAL-PERONEAL ARTERY BYPASS;  Surgeon: Sheree Penne Bruckner, MD;  Location: [facility]  OR;  Service: Vascular;  Laterality: Left;   COLONOSCOPY     COLONOSCOPY W/ POLYPECTOMY  2014   IR PARACENTESIS  07/02/2018   IR TRANSCATHETER BX  07/17/2018   IR VENOGRAM HEPATIC WO HEMODYNAMIC EVALUATION  07/17/2018   myringectomy  06/2005   POLYPECTOMY     UMBILICAL HERNIA REPAIR  01/1997   VASECTOMY  07/2002   VEIN HARVEST Left 04/18/2023   Procedure: VEIN HARVEST USING GREATER SAPHENOUS VEIN;  Surgeon: Sheree Penne Bruckner, MD;  Location: [facility] OR;  Service: Vascular;  Laterality: Left;    Family History family history includes Hypertension in an other family member.  Social History Social History   Socioeconomic History   Marital status: Married    Spouse name: Not on file   Number of children: 5   Years of education: Not on file   Highest education level: Not on file  Occupational History   Occupation: [employer]    Employer: [employer]   Occupation: [employer]  Tobacco Use   Smoking status: Light Smoker    Current packs/day: 0.25    Types: Cigarettes   Smokeless tobacco: Never  Vaping Use   Vaping status: Never Used  Substance and Sexual Activity   Alcohol  use: Yes    Comment: 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 [employer]   Smoker, heavy drinking of [place] mist alcohol  in past   No drug use   Social Drivers of Corporate Investment Banker Strain: Not on file  Food Insecurity: No Food Insecurity (04/18/2023)   Hunger Vital Sign    Worried About Running Out of Food in the Last Year: Never true    Ran Out of Food in the Last Year: Never true  Transportation Needs: No Transportation Needs (04/18/2023)   PRAPARE - Administrator, Civil Service (Medical): No    Lack of Transportation (Non-Medical): No  Physical Activity: Not on file   Stress: Not on file  Social Connections: Not on file  Intimate Partner Violence: Not At Risk (04/18/2023)  Humiliation, Afraid, Rape, and Kick questionnaire    Fear of Current or Ex-Partner: No    Emotionally Abused: No    Physically Abused: No    Sexually Abused: No    Lab Results  Component Value Date   HGBA1C 8.3 (A) 06/03/2023   HGBA1C 10.8 (H) 04/12/2023   HGBA1C 11.0 (H) 09/27/2022   Lab Results  Component Value Date   CHOL 93 04/19/2023   Lab Results  Component Value Date   HDL 40 (L) 04/19/2023   Lab Results  Component Value Date   LDLCALC 38 04/19/2023   Lab Results  Component Value Date   TRIG 73 04/19/2023   Lab Results  Component Value Date   CHOLHDL 2.3 04/19/2023   Lab Results  Component Value Date   CREATININE 0.71 05/12/2023   Lab Results  Component Value Date   GFR 97.50 05/12/2023   Lab Results  Component Value Date   MICROALBUR 3.5 (H) 02/15/2019      Component Value Date/Time   NA 136 05/12/2023 1517   K 4.3 05/12/2023 1517   CL 101 05/12/2023 1517   CO2 24 05/12/2023 1517   GLUCOSE 167 (H) 05/12/2023 1517   BUN 16 05/12/2023 1517   CREATININE 0.71 05/12/2023 1517   CALCIUM 10.0 05/12/2023 1517   PROT 7.5 05/12/2023 1517   ALBUMIN 4.5 05/12/2023 1517   AST 28 05/12/2023 1517   ALT 44 05/12/2023 1517   ALKPHOS 200 (H) 05/12/2023 1517   BILITOT 0.3 05/12/2023 1517   GFRNONAA >60 04/21/2023 0958   GFRAA >60 06/01/2018 0427      Latest Ref Rng & Units 05/12/2023    3:17 PM 04/21/2023    9:58 AM 04/20/2023    3:40 AM  BMP  Glucose 70 - 99 mg/dL 832  749  756   BUN 6 - 23 mg/dL 16  16  16    Creatinine 0.40 - 1.50 mg/dL 9.28  9.09  9.06   Sodium 135 - 145 mEq/L 136  136  132   Potassium 3.5 - 5.1 mEq/L 4.3  4.1  3.9   Chloride 96 - 112 mEq/L 101  101  101   CO2 19 - 32 mEq/L 24  23  21    Calcium 8.4 - 10.5 mg/dL 89.9  9.3  8.8        Component Value Date/Time   WBC 6.3 05/12/2023 1517   RBC 3.79 (L) 05/12/2023 1517    HGB 13.0 05/12/2023 1517   HCT 38.3 (L) 05/12/2023 1517   PLT 317.0 05/12/2023 1517   MCV 101.0 (H) 05/12/2023 1517   MCH 34.3 (H) 04/21/2023 0958   MCHC 33.9 05/12/2023 1517   RDW 13.4 05/12/2023 1517   LYMPHSABS 2.0 05/12/2023 1517   MONOABS 0.7 05/12/2023 1517   EOSABS 0.4 05/12/2023 1517   BASOSABS 0.1 05/12/2023 1517     Parts of this note may have been dictated using voice recognition software. There may be variances in spelling and vocabulary which are unintentional. Not all errors are proofread. Please notify the dino if any discrepancies are noted or if the meaning of any statement is not clear.

## 2023-06-03 NOTE — Patient Instructions (Signed)

## 2023-06-04 ENCOUNTER — Encounter: Payer: Self-pay | Admitting: "Endocrinology

## 2023-06-04 LAB — THYROGLOBULIN ANTIBODY: Thyroglobulin Ab: <1 [IU]/mL (ref <=1)

## 2023-06-06 ENCOUNTER — Ambulatory Visit (HOSPITAL_COMMUNITY)
Admission: RE | Admit: 2023-06-06 | Discharge: 2023-06-06 | Disposition: A | Discharge status: Home / Self Care | Payer: No Typology Code available for payment source | Source: Ambulatory Visit | Attending: Vascular Surgery | Admitting: Vascular Surgery

## 2023-06-06 ENCOUNTER — Ambulatory Visit (INDEPENDENT_AMBULATORY_CARE_PROVIDER_SITE_OTHER)
Admission: RE | Admit: 2023-06-06 | Discharge: 2023-06-06 | Disposition: A | Discharge status: Home / Self Care | Payer: No Typology Code available for payment source | Source: Ambulatory Visit | Attending: Vascular Surgery

## 2023-06-06 DIAGNOSIS — I70222 Atherosclerosis of native arteries of extremities with rest pain, left leg: Secondary | ICD-10-CM | POA: Insufficient documentation

## 2023-06-06 LAB — VAS US ABI WITH/WO TBI
Left ABI: 0.99
Right ABI: 0.9

## 2023-06-10 NOTE — Progress Notes (Deleted)
   Zachary Casimir T. Ziomara Birenbaum, MD, CAQ Sports Medicine [facility] at [facility] [address] [place], [address]  Phone: [phone]  FAX: [phone]  Zachary Lewis - 63 y.o. male  MRN [id number]  Date of Birth: [birth year]  Date: 06/11/2023  PCP: Zachary Pimple, MD  Referral: Zachary Pimple, MD  No chief complaint on file.  Subjective:   Zachary Lewis is a 63 y.o. very pleasant male patient with There is no height or weight on file to calculate BMI. who presents with the following:  Patient is here for evaluation of some left-sided shoulder pain.  I did see him for some shoulder pain more than a year ago, and felt like he had strained his rotator cuff.  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  He does have a notable diabetes.  Lab Results  Component Value Date   HGBA1C 8.3 (A) 06/03/2023      Review of Systems is noted in the HPI, as appropriate  Objective:   There were no vitals taken for this visit.  GEN: No acute distress; alert,appropriate. PULM: Breathing comfortably in no respiratory distress PSYCH: Normally interactive.   Laboratory and Imaging Data:  Assessment and Plan:   ***

## 2023-06-11 ENCOUNTER — Ambulatory Visit: Payer: No Typology Code available for payment source | Admitting: Family Medicine

## 2023-06-11 ENCOUNTER — Ambulatory Visit (INDEPENDENT_AMBULATORY_CARE_PROVIDER_SITE_OTHER): Payer: No Typology Code available for payment source | Admitting: Physician Assistant

## 2023-06-11 ENCOUNTER — Encounter: Payer: Self-pay | Admitting: Vascular Surgery

## 2023-06-11 VITALS — BP 123/83 | HR 92 | Temp 97.8°F | Resp 20 | Ht 66.0 in | Wt 199.8 lb

## 2023-06-11 DIAGNOSIS — Z9889 Other specified postprocedural states: Secondary | ICD-10-CM

## 2023-06-11 DIAGNOSIS — M869 Osteomyelitis, unspecified: Secondary | ICD-10-CM

## 2023-06-11 DIAGNOSIS — E1165 Type 2 diabetes mellitus with hyperglycemia: Secondary | ICD-10-CM

## 2023-06-11 DIAGNOSIS — I70222 Atherosclerosis of native arteries of extremities with rest pain, left leg: Secondary | ICD-10-CM

## 2023-06-11 DIAGNOSIS — M25512 Pain in left shoulder: Secondary | ICD-10-CM

## 2023-06-11 NOTE — Progress Notes (Signed)
POST OPERATIVE OFFICE NOTE    CC:  F/u for surgery  HPI:  This is a 63 y.o. male who is s/p left SFA to peroneal artery bypass with nonreversed, ipsilateral, translocated greater saphenous vein on 04/18/23 by Dr. Randie Heinz. 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 He has been doing well post operatively. He is followed by Dr. Logan Bores with podiatry for management of his toe.     Pt returns today for follow up with non invasive studies.  Pt states overall he has been doing very well. He denies any pain in his legs on ambulate or rest. No new tissue loss. He does report some medial thigh soreness after being on his stationary bike yesterday for 45 minutes. He explains that he had been doing 15 minutes a day and felt good so he did more and he thinks he ended up doing too much. Left great toe is overall stable. He is painting this with betadine daily. He has follow up with Dr. Logan Bores in a couple weeks. He is having some swelling in his leg since surgery. He has been elevating with some improvement. He also reports continued numbness in his foot since prior to surgery. He is medically managed with Aspirin, Plavix and Repatha.   No Known Allergies  Current Outpatient Medications  Medication Sig Dispense Refill   aspirin EC 81 MG tablet Take 1 tablet (81 mg total) by mouth daily. Swallow whole. 90 tablet 3   clopidogrel (PLAVIX) 75 MG tablet Take 1 tablet (75 mg total) by mouth daily. 90 tablet 3   clotrimazole-betamethasone (LOTRISONE) cream Apply 1 Application topically daily. 45 g 3   Continuous Glucose Sensor (DEXCOM G7 SENSOR) MISC 1 Device by Does not apply route continuous. 9 each 0   Evolocumab (REPATHA SURECLICK) 140 MG/ML SOAJ Inject 140 mg into the skin every 14 (fourteen) days. (Patient taking differently: Inject 1 mL into the skin every 14 (fourteen) days. Every other Tuesday) 6 mL 3   ezetimibe (ZETIA) 10 MG tablet Take 1 tablet (10 mg total) by mouth daily. 30 tablet 0    glucose blood (ONETOUCH VERIO) test strip 1 each by Other route daily. And lancets 1/day 100 each 3   insulin glargine (LANTUS SOLOSTAR) 100 UNIT/ML Solostar Pen Inject 28 Units into the skin daily. Plus sliding scale, max dose 50 units a day 25.2 mL 1   Insulin Pen Needle 31G X 8 MM MISC Inject 1 Needle into the skin daily at 6 (six) AM. 100 each 5   Lancets (ONETOUCH ULTRASOFT) lancets Use as instructed  ,1 needle  insert to lancet device to check blood glucose. 100 each 12   Semaglutide, 1 MG/DOSE, 4 MG/3ML SOPN Inject 1 mg as directed once a week. 3 mL 0   spironolactone (ALDACTONE) 50 MG tablet TAKE 1 TABLET(50 MG) BY MOUTH DAILY 90 tablet 1   tadalafil (CIALIS) 20 MG tablet Take 1 tablet (20 mg total) by mouth daily as needed. 60 tablet 3   thiamine 100 MG tablet Take 1 tablet (100 mg total) by mouth daily. 30 tablet 0   No current facility-administered medications for this visit.     ROS:  See HPI  Physical Exam  Vitals:   06/11/23 1407  BP: 123/83  Pulse: 92  Resp: 20  Temp: 97.8 F (36.6 C)  SpO2: 94%   General: well appearing, well nourished Lungs: non labored Cardiac: regular Incision:  left leg incisions are all well healed Extremities:  BLE well perfused and warm with brisk doppler DP and PT signals. Mild edema of left leg and ankle. Left great toe tip with dry ulceration. Painted with betadine  Neuro: alert and oriented Abdomen:  soft  Non invasive Vascular lab: 06/06/23  +-------+-----------+-----------+------------+------------+  ABI/TBIToday's ABIToday's TBIPrevious ABIPrevious TBI  +-------+-----------+-----------+------------+------------+  Right 0.9        0.75       0.8         0.76          +-------+-----------+-----------+------------+------------+  Left  0.99       0.73       0.3         0.3           +-------+-----------+-----------+------------+------------+   LOWER EXTREMITY ARTERIAL DUPLEX  STUDY  +-----------+--------+-----+--------+----------+--------+ LEFT       PSV cm/sRatioStenosisWaveform  Comments +-----------+--------+-----+--------+----------+--------+ CFA Distal 110                  triphasic          +-----------+--------+-----+--------+----------+--------+ DFA        62                   biphasic           +-----------+--------+-----+--------+----------+--------+ SFA Prox   116                  triphasic          +-----------+--------+-----+--------+----------+--------+ SFA Mid    47                   triphasic          +-----------+--------+-----+--------+----------+--------+ SFA Distal                                graft    +-----------+--------+-----+--------+----------+--------+ POP Prox                                  graft    +-----------+--------+-----+--------+----------+--------+ POP Distal                                graft    +-----------+--------+-----+--------+----------+--------+ ATA Distal 38                   monophasic         +-----------+--------+-----+--------+----------+--------+ PTA Distal 27                   monophasic         +-----------+--------+-----+--------+----------+--------+ PERO Distal92                   monophasic         +-----------+--------+-----+--------+----------+--------+    Left Graft #1: distal SFA to peroneal +--------------------+--------+--------+----------+--------+                     PSV cm/sStenosisWaveform  Comments +--------------------+--------+--------+----------+--------+ Inflow              54              triphasic          +--------------------+--------+--------+----------+--------+ Proximal Anastomosis131             monophasic         +--------------------+--------+--------+----------+--------+ Proximal Graft  109             monophasic          +--------------------+--------+--------+----------+--------+ Mid Graft           55              monophasic         +--------------------+--------+--------+----------+--------+ Distal Graft        46              monophasic         +--------------------+--------+--------+----------+--------+ Distal Anastomosis  118             monophasic         +--------------------+--------+--------+----------+--------+ Outflow             103             monophasic         +--------------------+--------+--------+----------+--------+  Summary: Left: Patent graft with no stenosis.     Assessment/Plan:  This is a 63 y.o. male who is s/p:left SFA to peroneal artery bypass with nonreversed, ipsilateral, translocated greater saphenous vein on 04/18/23 by Dr. Randie Heinz. 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 He has been doing well post operatively. He is followed by Dr. Logan Bores with podiatry for management of his toe. His incisions are all healed. Left great toe ulcer is dry and stable. He is without any rest pain or claudication. No further tissue loss. ABI's have improved significantly post op. Duplex shows patient LLE bypass graft. There are some lower velocities in the mid graft as well as outflow vessels with monophasic flow. This is secondary to his tibial disease.  - Continue Aspirin, Plavix, Repatha -Recommend continued exercise regimen - congratulated him on his continued smoking cessation - He is cleared to return to work on 2/3 without restrictions - I will have him follow up again in 3 months with ABI and Duplex so we can follow the lower velocities - he knows to call for any new or concerning symptoms    Nathanial Rancher, [facility] [phone]   Clinic MD:  Randie Heinz

## 2023-06-12 ENCOUNTER — Encounter: Payer: Self-pay | Admitting: Vascular Surgery

## 2023-06-14 LAB — ANTI-TPO AB (RDL): Anti-TPO Ab (RDL): <9 [IU]/mL (ref <9.0)

## 2023-06-15 ENCOUNTER — Encounter: Payer: Self-pay | Admitting: Family Medicine

## 2023-06-23 ENCOUNTER — Ambulatory Visit: Payer: No Typology Code available for payment source

## 2023-06-23 ENCOUNTER — Ambulatory Visit (INDEPENDENT_AMBULATORY_CARE_PROVIDER_SITE_OTHER): Payer: No Typology Code available for payment source | Admitting: Podiatry

## 2023-06-23 ENCOUNTER — Encounter: Payer: Self-pay | Admitting: Podiatry

## 2023-06-23 DIAGNOSIS — L97522 Non-pressure chronic ulcer of other part of left foot with fat layer exposed: Secondary | ICD-10-CM | POA: Diagnosis not present

## 2023-06-23 DIAGNOSIS — E0843 Diabetes mellitus due to underlying condition with diabetic autonomic (poly)neuropathy: Secondary | ICD-10-CM

## 2023-06-23 DIAGNOSIS — E1351 Other specified diabetes mellitus with diabetic peripheral angiopathy without gangrene: Secondary | ICD-10-CM

## 2023-06-23 DIAGNOSIS — D2372 Other benign neoplasm of skin of left lower limb, including hip: Secondary | ICD-10-CM | POA: Diagnosis not present

## 2023-06-23 DIAGNOSIS — M2141 Flat foot [pes planus] (acquired), right foot: Secondary | ICD-10-CM

## 2023-06-23 DIAGNOSIS — B353 Tinea pedis: Secondary | ICD-10-CM

## 2023-06-23 MED ORDER — CLOTRIMAZOLE-BETAMETHASONE 1-0.05 % EX CREA: 1.0000 | TOPICAL_CREAM | Freq: Every day | CUTANEOUS | 3 refills | Status: AC

## 2023-06-23 NOTE — Progress Notes (Signed)
Chief Complaint  Patient presents with   Routine Post Op    Patient states his left hallux has been find since last visit, No pain nor discomfort.  Patient would like Callouses to be cut off also.    SUBJECTIVE Presenting today for follow-up evaluation of ulcers to the distal tip of the left great toe.  Patient is returning to work today, 06/23/2023.  He is doing well.  Significant improvement overall of his feet.  He has been applying Betadine with light dressing to the left great toe.  He is also scheduled to be molded for diabetic shoes with custom molded Plastizote insoles today.  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 Derm continued improvement.  The wound to the fifth toe is completely resolved and healed.  Wound persist to the left great toe measuring approximately 1.0 x 1.0 x 0.2 cm.  Well adhered superficial scar noted.  It appears very stable with no drainage.  No exposed bone.  Clinically no indication of infection Symptomatic calluses also noted to the bilateral feet. Vasc  PERIPHERAL VASCULAR CATHETERIZATION 04/15/2023 Findings: Widely patent infrarenal aorta and bilateral renal arteries.  Widely patent bilateral iliac systems.  Widely patent left common femoral and profunda arteries.  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  The popliteal artery is occluded the peroneal artery is reconstituted distally which fills the DP on the  foot.  The PT is chronically occluded and does not appear to reconstitute.   Neuro light touch and protective threshold sensation diminished bilaterally.   Musculoskeletal Exam No symptomatic pedal deformities noted bilateral. Muscular strength within normal limits.  Patient ambulatory.  No prior amputations  MR FOOT LEFT WO CONTRAST 04/12/2023 IMPRESSION: 1. Small ulceration at the dorsal tip of the great toe with exposed bone. Mild marrow edema in the first distal phalanx is worrisome for early osteomyelitis.  ASSESSMENT 1. 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  Ulcer left great toe and fifth digit secondary to diabetes and peripheral limb ischemia 3.  Eccrine poroma/symptomatic calluses bilateral feet 4.  Chronic tinea pedis left  PLAN OF CARE -Patient evaluated today.   -Refill prescription for Lotrisone cream apply twice daily - Medically necessary excisional debridement including subcutaneous tissue was performed today using a tissue nipper.  Excisional debridement of the necrotic nonviable tissue down to healthier bleeding viable tissue was performed with postoperative measurement signs. -Mechanical debridement of nails 1-5 bilaterally performed using a nail nipper. Filed with dremel without incident.  -Excisional debridement of the hyperkeratotic skin lesions was performed today using a 312 scalpel without incident or bleeding.   -Continue Betadine application to the left great toe since this seems to be improving and the wound is stable - Patient fitted for diabetic shoes with custom molded Plastizote insoles with diabetic shoe department after her visit today -Return  to clinic 4 weeks follow-up x-ray  *Works at [employer] x 25 years  Felecia Shelling, DPM [facility]  Dr. Felecia Shelling, DPM    [address] [place], [place] [address]                Office [phone]  Fax  [phone]

## 2023-06-23 NOTE — Progress Notes (Signed)
Patient presents to the office today for diabetic shoe and insole measuring. Patient has never had DM shoe gear prior patient has healing would distal Tip of Left great toes with thick pre-ulcerative calluses BIL 1st and 5th MT heads  DM shoes and inserts will help support and Protect Feet, reduce friction, sheer and pressure that can cause ulceration  Patient was measured with brannock device to determine size and width for 1 pair of extra depth shoes and foam casted for 3 pair of insoles.   Shoes and insoles will be ordered at that time and patient will be notified for an appointment for fitting when they arrive. Shoe size (per patient): 11 Brannock measurement: 11 Shoe choice:   [id number] / [id number] Shoe size ordered: 11WD  SENDING FOR PRIOR AUTH ([employer]) BEFORE PLACING ORDER WILL CALL PATIENT IF DENIED  Kenston [place] Cped, CFo, CFm Ppw signed

## 2023-07-01 ENCOUNTER — Other Ambulatory Visit: Payer: Self-pay

## 2023-07-01 DIAGNOSIS — I70222 Atherosclerosis of native arteries of extremities with rest pain, left leg: Secondary | ICD-10-CM

## 2023-07-10 ENCOUNTER — Other Ambulatory Visit (HOSPITAL_COMMUNITY): Payer: Self-pay

## 2023-07-10 MED ORDER — TADALAFIL 20 MG PO TABS
20.0000 mg | ORAL_TABLET | Freq: Every day | ORAL | 5 refills | Status: AC | PRN
  Filled 2023-07-10: qty 50, 50d supply, fill #0
  Filled 2023-09-11: qty 50, 50d supply, fill #1
  Filled 2023-11-05: qty 50, 50d supply, fill #2
  Filled 2023-12-25: qty 50, 50d supply, fill #3
  Filled 2024-02-19: qty 50, 50d supply, fill #4
  Filled 2024-06-07: qty 50, 50d supply, fill #5

## 2023-07-15 ENCOUNTER — Encounter: Payer: Self-pay | Admitting: "Endocrinology

## 2023-07-28 ENCOUNTER — Ambulatory Visit: Payer: No Typology Code available for payment source | Admitting: Podiatry

## 2023-08-25 ENCOUNTER — Encounter: Payer: Self-pay | Admitting: Podiatry

## 2023-08-25 ENCOUNTER — Ambulatory Visit (INDEPENDENT_AMBULATORY_CARE_PROVIDER_SITE_OTHER): Admitting: Podiatry

## 2023-08-25 DIAGNOSIS — L97522 Non-pressure chronic ulcer of other part of left foot with fat layer exposed: Secondary | ICD-10-CM | POA: Diagnosis not present

## 2023-08-25 DIAGNOSIS — B351 Tinea unguium: Secondary | ICD-10-CM | POA: Diagnosis not present

## 2023-08-25 DIAGNOSIS — D2372 Other benign neoplasm of skin of left lower limb, including hip: Secondary | ICD-10-CM

## 2023-08-25 DIAGNOSIS — M79675 Pain in left toe(s): Secondary | ICD-10-CM

## 2023-08-25 DIAGNOSIS — M79674 Pain in right toe(s): Secondary | ICD-10-CM | POA: Diagnosis not present

## 2023-08-25 NOTE — Progress Notes (Signed)
 Chief Complaint  Patient presents with   Wound Check    Diabetic with great to wound with fatty layer exposed on left toe healing well no infection Callous right great toe needs shaving    SUBJECTIVE Presenting today for follow-up evaluation of ulcers to the distal tip of the left great toe.  Patient doing well. He is back at work with no complications. He has noticed recurrence of the callus to the distal toe but does not notice any open wound.   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 Derm Ulcer to the distal tip of the toe is significantly improved. After debridement the wound measures approximately 0.3x0.3x0.1cm. Granular wound base. No malodor. No exposed bone. Good potential for continued healing. Vasc  PERIPHERAL VASCULAR CATHETERIZATION 04/15/2023 Findings: Widely patent infrarenal aorta and bilateral renal arteries.  Widely patent bilateral iliac systems.  Widely patent left common femoral and profunda arteries.  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  The popliteal artery is occluded the peroneal artery is reconstituted distally which fills the DP on the foot.  The PT is chronically occluded and does not appear to reconstitute.   Neuro light touch and protective threshold sensation diminished bilaterally.   Musculoskeletal Exam No symptomatic pedal deformities noted bilateral. Muscular strength within normal limits.  Patient  ambulatory.  No prior amputations  MR FOOT LEFT WO CONTRAST 04/12/2023 IMPRESSION: 1. Small ulceration at the dorsal tip of the great toe with exposed bone. Mild marrow edema in the first distal phalanx is worrisome for early osteomyelitis.  ASSESSMENT 1. 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  Ulcer left great toe and fifth digit secondary to diabetes and peripheral limb ischemia 3.  Eccrine poroma/symptomatic calluses bilateral feet 4.  Chronic tinea pedis left  PLAN OF CARE -Patient evaluated today.   -Mechanical debridement of nails 1-5 performed using a nail nipper without incident or bleeding -Continue Lotrisone cream apply twice daily PRN - Medically necessary excisional debridement including subcutaneous tissue was performed today using a tissue nipper.  Excisional debridement of the necrotic nonviable tissue down to healthier bleeding viable tissue was performed with postoperative measurement signs. -Excisional debridement of the hyperkeratotic skin lesions was performed today using a 312 scalpel without incident or bleeding.  Salicylic acid applied.  -Continue Betadine application to the left great toe since this seems to be improving and the wound is stable -Continue diabetic shoes with custom molded Plastizote insoles -Return to clinic 4 weeks follow-up x-ray  *Works at [employer] x 25 years  Felecia Shelling, DPM [facility]  Dr. Felecia Shelling, DPM    [address]  [place], [place] [address]                Office [phone]  Fax [phone]

## 2023-08-26 ENCOUNTER — Telehealth: Payer: Self-pay | Admitting: Pharmacy Technician

## 2023-08-26 NOTE — Telephone Encounter (Signed)
 Pharmacy Patient Advocate Encounter   Received notification from Onbase that prior authorization for repatha is required/requested.   Insurance verification completed.   The patient is insured through [facility] .   Per test claim: PA required; PA submitted to above mentioned insurance via CoverMyMeds Key/confirmation #/EOC [facility] Status is pending

## 2023-08-27 NOTE — Telephone Encounter (Signed)
 Pharmacy Patient Advocate Encounter  Received notification from [facility] that Prior Authorization for repatha has been APPROVED from 09/26/23 to 09/25/24  Pt just got 08/12/23 this was a renewal pa   PA #/Case ID/Reference #:  [id number]

## 2023-09-02 ENCOUNTER — Ambulatory Visit: Payer: No Typology Code available for payment source

## 2023-09-02 ENCOUNTER — Encounter (HOSPITAL_COMMUNITY): Payer: No Typology Code available for payment source

## 2023-09-02 ENCOUNTER — Other Ambulatory Visit (HOSPITAL_COMMUNITY): Payer: No Typology Code available for payment source

## 2023-09-03 ENCOUNTER — Ambulatory Visit (HOSPITAL_COMMUNITY): Payer: No Typology Code available for payment source

## 2023-09-03 ENCOUNTER — Ambulatory Visit: Payer: No Typology Code available for payment source

## 2023-09-08 ENCOUNTER — Telehealth: Payer: Self-pay | Admitting: "Endocrinology

## 2023-09-08 ENCOUNTER — Other Ambulatory Visit: Payer: Self-pay

## 2023-09-08 DIAGNOSIS — E1165 Type 2 diabetes mellitus with hyperglycemia: Secondary | ICD-10-CM

## 2023-09-08 MED ORDER — SEMAGLUTIDE (1 MG/DOSE) 4 MG/3ML ~~LOC~~ SOPN: 1.0000 mg | PEN_INJECTOR | SUBCUTANEOUS | 0 refills | Status: DC

## 2023-09-08 NOTE — Telephone Encounter (Signed)
 MEDICATION:  Semaglutide  (1 MG/DOSE) Semaglutide , 1 MG/DOSE, 4 MG/3ML SOPN  PHARMACY:    [id number] Jonette Nestle, [place] - [address] AT [facility] OF [address] (Ph: [phone])    HAS THE PATIENT CONTACTED THEIR PHARMACY?  Yes  IS THIS A 90 DAY SUPPLY : Yes  IS PATIENT OUT OF MEDICATION: No  IF NOT; HOW MUCH IS LEFT: Nnot sure  LAST APPOINTMENT DATE: @1 /14/2025  NEXT APPOINTMENT DATE:@5 /10/2023  DO WE HAVE YOUR PERMISSION TO LEAVE A DETAILED MESSAGE?: Yes  OTHER COMMENTS:    **Let patient know to contact pharmacy at the end of the day to make sure medication is ready. **  ** Please notify patient to allow 48-72 hours to process**  **Encourage patient to contact the pharmacy for refills or they can request refills through [facility]**

## 2023-09-23 ENCOUNTER — Ambulatory Visit: Admitting: "Endocrinology

## 2023-09-24 LAB — HM DIABETES EYE EXAM

## 2023-09-25 ENCOUNTER — Ambulatory Visit (INDEPENDENT_AMBULATORY_CARE_PROVIDER_SITE_OTHER)

## 2023-09-25 DIAGNOSIS — E1142 Type 2 diabetes mellitus with diabetic polyneuropathy: Secondary | ICD-10-CM | POA: Diagnosis not present

## 2023-09-25 DIAGNOSIS — M2141 Flat foot [pes planus] (acquired), right foot: Secondary | ICD-10-CM | POA: Diagnosis not present

## 2023-09-25 DIAGNOSIS — L97522 Non-pressure chronic ulcer of other part of left foot with fat layer exposed: Secondary | ICD-10-CM | POA: Diagnosis not present

## 2023-09-25 DIAGNOSIS — E1351 Other specified diabetes mellitus with diabetic peripheral angiopathy without gangrene: Secondary | ICD-10-CM

## 2023-09-25 DIAGNOSIS — M2142 Flat foot [pes planus] (acquired), left foot: Secondary | ICD-10-CM | POA: Diagnosis not present

## 2023-09-25 NOTE — Progress Notes (Signed)

## 2023-10-03 ENCOUNTER — Other Ambulatory Visit: Payer: Self-pay | Admitting: Cardiovascular Disease

## 2023-10-04 ENCOUNTER — Other Ambulatory Visit: Payer: Self-pay | Admitting: "Endocrinology

## 2023-10-04 DIAGNOSIS — E1165 Type 2 diabetes mellitus with hyperglycemia: Secondary | ICD-10-CM

## 2023-10-25 ENCOUNTER — Other Ambulatory Visit: Payer: Self-pay | Admitting: Family Medicine

## 2023-11-05 ENCOUNTER — Encounter: Payer: Self-pay | Admitting: "Endocrinology

## 2023-11-05 ENCOUNTER — Ambulatory Visit (INDEPENDENT_AMBULATORY_CARE_PROVIDER_SITE_OTHER): Admitting: "Endocrinology

## 2023-11-05 VITALS — BP 140/80 | HR 75 | Ht 66.0 in | Wt 206.0 lb

## 2023-11-05 DIAGNOSIS — Z7985 Long-term (current) use of injectable non-insulin antidiabetic drugs: Secondary | ICD-10-CM

## 2023-11-05 DIAGNOSIS — E1165 Type 2 diabetes mellitus with hyperglycemia: Secondary | ICD-10-CM

## 2023-11-05 DIAGNOSIS — E7800 Pure hypercholesterolemia, unspecified: Secondary | ICD-10-CM | POA: Diagnosis not present

## 2023-11-05 DIAGNOSIS — Z794 Long term (current) use of insulin: Secondary | ICD-10-CM | POA: Diagnosis not present

## 2023-11-05 LAB — POCT GLYCOSYLATED HEMOGLOBIN (HGB A1C): Hemoglobin A1C: 8.4 % — AB (ref 4.0–5.6)

## 2023-11-05 MED ORDER — SEMAGLUTIDE (2 MG/DOSE) 8 MG/3ML ~~LOC~~ SOPN: 2.0000 mg | PEN_INJECTOR | SUBCUTANEOUS | 1 refills | Status: DC

## 2023-11-05 MED ORDER — LANTUS SOLOSTAR 100 UNIT/ML ~~LOC~~ SOPN: 32.0000 [IU] | PEN_INJECTOR | Freq: Every day | SUBCUTANEOUS | 1 refills | Status: DC

## 2023-11-05 NOTE — Patient Instructions (Signed)
 Will recommend for the following change of medications to: Lantus  30-32 units once every morning Increase to ozempic  2 mg/wk Goal blood sugar is 150   ___________    Goals of DM therapy:  Morning Fasting blood sugar: 80-140  Blood sugar before meals: 80-140 Bed time blood sugar: 100-150  A1C <7%, limited only by hypoglycemia  1.Diabetes medications and their side effects discussed, including hypoglycemia    2. Check blood glucose:  a) Always check blood sugars before driving. Please see below (under hypoglycemia) on how to manage b) Check a minimum of 3 times/day or more as needed when having symptoms of hypoglycemia.   c) Try to check blood glucose before sleeping/in the middle of the night to ensure that it is remaining stable and not dropping less than 100 d) Check blood glucose more often if sick  3. Diet: a) 3 meals per day schedule b: Restrict carbs to 60-70 grams (4 servings) per meal c) Colorful vegetables - 3 servings a day, and low sugar fruit 2 servings/day Plate control method: 1/4 plate protein, 1/4 starch, 1/2 green, yellow, or red vegetables d) Avoid carbohydrate snacks unless hypoglycemic episode, or increased physical activity  4. Regular exercise as tolerated, preferably 3 or more hours a week  5. Hypoglycemia: a)  Do not drive or operate machinery without first testing blood glucose to assure it is over 90 mg%, or if dizzy, lightheaded, not feeling normal, etc, or  if foot or leg is numb or weak. b)  If blood glucose less than 70, take four 5gm Glucose tabs or 15-30 gm Glucose gel.  Repeat every 15 min as needed until blood sugar is >100 mg/dl. If hypoglycemia persists then call 911.   6. Sick day management: a) Check blood glucose more often b) Continue usual therapy if blood sugars are elevated.   7. Contact the doctor immediately if blood glucose is frequently <60 mg/dl, or an episode of severe hypoglycemia occurs (where someone had to give you  glucose/  glucagon or if you passed out from a low blood glucose), or if blood glucose is persistently >350 mg/dl, for further management  8. A change in level of physical activity or exercise and a change in diet may also affect your blood sugar. Check blood sugars more often and call if needed.  Instructions: 1. Bring glucose meter, blood glucose records on every visit for review 2. Continue to follow up with primary care physician and other providers for medical care 3. Yearly eye  and foot exam 4. Please get blood work done prior to the next appointment

## 2023-11-05 NOTE — Progress Notes (Signed)
 Outpatient Endocrinology Note Zachary Newcomer, MD  11/05/23   Zachary Lewis August 22, 1960 [id number]  Referring Provider: Clemens Curt, MD Primary Care Provider: Clemens Curt, MD Reason for consultation: Subjective   Assessment & Plan  Diagnoses and all orders for this visit:  Uncontrolled type 2 diabetes mellitus with hyperglycemia (HCC) -     POCT glycosylated hemoglobin (Hb A1C) -     Microalbumin / creatinine urine ratio -     Lipid panel -     Comprehensive metabolic panel with GFR  Long-term (current) use of injectable non-insulin  antidiabetic drugs  Long-term insulin  use (HCC)  Pure hypercholesterolemia  Other orders -     Semaglutide , 2 MG/DOSE, 8 MG/3ML SOPN; Inject 2 mg as directed once a week. -     insulin  glargine (LANTUS  SOLOSTAR) 100 UNIT/ML Solostar Pen; Inject 32 Units into the skin daily. Plus sliding scale, max dose 50 units a day   Diabetes complicated by neuropathy Hba1c goal less than 7.0, last Hba1c was 8.4. Will recommend for the following change of medications to: Lantus  30-32 units once every morning Increase to ozempic  2 mg/wk On dexcom  Prior diarrhea with metformin  Not interested in synjardy or Metformin  XR  No known contraindications to any of above medications No history of MEN syndrome/medullary thyroid  cancer/pancreatitis or pancreatic cancer in self or family  Hyperlipidemia -04/2023: Last LDL improved from 150 to 38 -not on statin -On repatha  140 mg every 2 wks, stop ezetimibe  10 mg every day  -Follow low fat diet and exercise   -Blood pressure goal <140/90 - Microalbumin/creatinine at goal < 30 three yrs ago-did not do lab, reordered  -not on ACE/ARB  -diet changes including salt restriction -limit eating outside -counseled BP targets per standards of diabetes care -Uncontrolled blood pressure can lead to retinopathy, nephropathy and cardiovascular and atherosclerotic heart disease  Reviewed and counseled  on: -A1C target -Blood sugar targets -Complications of uncontrolled diabetes  -Checking blood sugar before meals and bedtime and bring log next visit -All medications with mechanism of action and side effects -Hypoglycemia management: rule of 15's, Glucagon Emergency Kit and medical alert ID -low-carb low-fat plate-method diet -At least 20 minutes of physical activity per day -Annual dilated retinal eye exam and foot exam -compliance and follow up needs -follow up as scheduled or earlier if problem gets worse  Call if blood sugar is less than 70 or consistently above 250    Take a 15 gm snack of carbohydrate at bedtime before you go to sleep if your blood sugar is less than 100.    If you are going to fast after midnight for a test or procedure, ask your physician for instructions on how to reduce/decrease your insulin  dose.    Call if blood sugar is less than 70 or consistently above 250  -Treating a low sugar by rule of 15  (15 gms of sugar every 15 min until sugar is more than 70) If you feel your sugar is low, test your sugar to be sure If your sugar is low (less than 70), then take 15 grams of a fast acting Carbohydrate (3-4 glucose tablets or glucose gel or 4 ounces of juice or regular soda) Recheck your sugar 15 min after treating low to make sure it is more than 70 If sugar is still less than 70, treat again with 15 grams of carbohydrate          Don't drive the hour of hypoglycemia  If unconscious/unable to eat or drink by mouth, use glucagon injection or nasal spray baqsimi and call 911. Can repeat again in 15 min if still unconscious.  Return in about 4 weeks (around 12/03/2023).   I have reviewed current medications, nurse's notes, allergies, vital signs, past medical and surgical history, family medical history, and social history for this encounter. Counseled patient on symptoms, examination findings, lab findings, imaging results, treatment decisions and monitoring and  prognosis. The patient understood the recommendations and agrees with the treatment plan. All questions regarding treatment plan were fully answered.  Zachary Newcomer, MD  11/05/23   History of Present Illness Zachary Lewis is a 63 y.o. year old male who presents for follow up of Type 2 diabetes mellitus.  Zachary Lewis was first diagnosed in 2010.   Diabetes education +  Home diabetes regimen: Lantus  24 units once every morning Ozempic  1 mg/wk  Took rybelsus  in the past  COMPLICATIONS -  MI/Stroke -  retinopathym +  neuropathy -  nephropathy  BLOOD SUGAR DATA CGM interpretation: At today's visit, we reviewed her CGM downloads. The full report is scanned in the media. Reviewing the CGM trends, BG are elevated almost entirely across the day, with only 5% in range.  Physical Exam  BP (!) 140/80   Pulse 75   Ht 5' 6 (1.676 m)   Wt 206 lb (93.4 kg)   SpO2 98%   BMI 33.25 kg/m    Constitutional: well developed, well nourished Head: normocephalic, atraumatic Eyes: sclera anicteric, no redness Neck: supple Lungs: normal respiratory effort Neurology: alert and oriented Skin: dry, no appreciable rashes Musculoskeletal: no appreciable defects Psychiatric: normal mood and affect Diabetic Foot Exam - Simple   No data filed      Current Medications Patient's Medications  New Prescriptions   SEMAGLUTIDE , 2 MG/DOSE, 8 MG/3ML SOPN    Inject 2 mg as directed once a week.  Previous Medications   ASPIRIN  EC 81 MG TABLET    Take 1 tablet (81 mg total) by mouth daily. Swallow whole.   CLOPIDOGREL  (PLAVIX ) 75 MG TABLET    Take 1 tablet (75 mg total) by mouth daily.   CLOTRIMAZOLE -BETAMETHASONE  (LOTRISONE ) CREAM    Apply 1 Application topically daily.   CONTINUOUS GLUCOSE SENSOR (DEXCOM G7 SENSOR) MISC    1 Device by Does not apply route continuous.   EZETIMIBE  (ZETIA ) 10 MG TABLET    Take 1 tablet (10 mg total) by mouth daily.   GLUCOSE BLOOD (ONETOUCH VERIO) TEST STRIP     1 each by Other route daily. And lancets 1/day   INSULIN  PEN NEEDLE 31G X 8 MM MISC    Inject 1 Needle into the skin daily at 6 (six) AM.   LANCETS (ONETOUCH ULTRASOFT) LANCETS    Use as instructed  ,1 needle  insert to lancet device to check blood glucose.   REPATHA  SURECLICK 140 MG/ML SOAJ    ADMINISTER 1 ML UNDER THE SKIN EVERY 14 DAYS   SEMAGLUTIDE , 1 MG/DOSE, (OZEMPIC , 1 MG/DOSE,) 4 MG/3ML SOPN    Inject 1 mg into the skin once a week.   SPIRONOLACTONE  (ALDACTONE ) 50 MG TABLET    TAKE 1 TABLET(50 MG) BY MOUTH DAILY   TADALAFIL  (CIALIS ) 20 MG TABLET    Take 1 tablet (20 mg total) by mouth daily as needed.   TADALAFIL  (CIALIS ) 20 MG TABLET    Take 1 tablet (20 mg total) by mouth daily as needed.   THIAMINE  100 MG  TABLET    Take 1 tablet (100 mg total) by mouth daily.  Modified Medications   Modified Medication Previous Medication   INSULIN  GLARGINE (LANTUS  SOLOSTAR) 100 UNIT/ML SOLOSTAR PEN insulin  glargine (LANTUS  SOLOSTAR) 100 UNIT/ML Solostar Pen      Inject 32 Units into the skin daily. Plus sliding scale, max dose 50 units a day    Inject 28 Units into the skin daily. Plus sliding scale, max dose 50 units a day  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) 08/04/2018   Diabetes mellitus    Type II   Hyperlipidemia    Hypertension    NSAID-induced gastric ulcer 12/13/2018   EGD 11/2018 H pylori neg Omeprazole  started   Obesity    Peripheral arterial disease (HCC)    Personal history of colonic adenomas 03/09/2013   Tinea pedis    Chronic    Past Surgical History Past Surgical History:  Procedure Laterality Date   ABDOMINAL AORTOGRAM W/LOWER EXTREMITY N/A 04/15/2023   Procedure: ABDOMINAL AORTOGRAM W/LOWER EXTREMITY;  Surgeon: Philipp Brawn, MD;  Location: [facility];   Service: Cardiovascular;  Laterality: N/A;   BYPASS GRAFT FEMORAL-PERONEAL Left 04/18/2023   Procedure: LEFT FEMORAL-PERONEAL ARTERY BYPASS;  Surgeon: Adine Hoof, MD;  Location: [facility] OR;  Service: Vascular;  Laterality: Left;   COLONOSCOPY     COLONOSCOPY W/ POLYPECTOMY  2014   IR PARACENTESIS  07/02/2018   IR TRANSCATHETER BX  07/17/2018   IR VENOGRAM HEPATIC WO HEMODYNAMIC EVALUATION  07/17/2018   myringectomy  06/2005   POLYPECTOMY     UMBILICAL HERNIA REPAIR  01/1997   VASECTOMY  07/2002   VEIN HARVEST Left 04/18/2023   Procedure: VEIN HARVEST USING GREATER SAPHENOUS VEIN;  Surgeon: Adine Hoof, MD;  Location: [facility] OR;  Service: Vascular;  Laterality: Left;    Family History family history includes Hypertension in an other family member.  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   No drug use   Social Drivers of Corporate investment banker Strain: Not on file  Food Insecurity: No Food Insecurity (04/18/2023)   Hunger Vital Sign    Worried About Running Out of Food in the Last Year: Never true  Ran Out of  Food in the Last Year: Never true  Transportation Needs: No Transportation Needs (04/18/2023)   PRAPARE - Administrator, Civil Service (Medical): No    Lack of Transportation (Non-Medical): No  Physical Activity: Not on file  Stress: Not on file  Social Connections: Not on file  Intimate Partner Violence: Not At Risk (04/18/2023)   Humiliation, Afraid, Rape, and Kick questionnaire    Fear of Current or Ex-Partner: No    Emotionally Abused: No    Physically Abused: No    Sexually Abused: No    Lab Results  Component Value Date   HGBA1C 8.4 (A) 11/05/2023   HGBA1C 8.3 (A) 06/03/2023   HGBA1C 10.8 (H) 04/12/2023   Lab Results  Component Value Date   CHOL 93 04/19/2023   Lab Results  Component Value Date   HDL 40 (L) 04/19/2023   Lab Results  Component Value Date   LDLCALC 38 04/19/2023   Lab Results  Component Value Date   TRIG 73 04/19/2023   Lab Results  Component Value Date   CHOLHDL 2.3 04/19/2023   Lab Results  Component Value Date   CREATININE 0.71 05/12/2023   Lab Results  Component Value Date   GFR 97.50 05/12/2023   Lab Results  Component Value Date   MICROALBUR 3.5 (H) 02/15/2019      Component Value Date/Time   NA 136 05/12/2023 1517   K 4.3 05/12/2023 1517   CL 101 05/12/2023 1517   CO2 24 05/12/2023 1517   GLUCOSE 167 (H) 05/12/2023 1517   BUN 16 05/12/2023 1517   CREATININE 0.71 05/12/2023 1517   CALCIUM 10.0 05/12/2023 1517   PROT 7.5 05/12/2023 1517   ALBUMIN 4.5 05/12/2023 1517   AST 28 05/12/2023 1517   ALT 44 05/12/2023 1517   ALKPHOS 200 (H) 05/12/2023 1517   BILITOT 0.3 05/12/2023 1517   GFRNONAA >60 04/21/2023 0958   GFRAA >60 06/01/2018 0427      Latest Ref Rng & Units 05/12/2023    3:17 PM 04/21/2023    9:58 AM 04/20/2023    3:40 AM  BMP  Glucose 70 - 99 mg/dL 161  096  045   BUN 6 - 23 mg/dL 16  16  16    Creatinine 0.40 - 1.50 mg/dL 4.09  8.11  9.14   Sodium 135 - 145 mEq/L 136  136  132   Potassium 3.5  - 5.1 mEq/L 4.3  4.1  3.9   Chloride 96 - 112 mEq/L 101  101  101   CO2 19 - 32 mEq/L 24  23  21    Calcium 8.4 - 10.5 mg/dL 78.2  9.3  8.8        Component Value Date/Time   WBC 6.3 05/12/2023 1517   RBC 3.79 (L) 05/12/2023 1517   HGB 13.0 05/12/2023 1517   HCT 38.3 (L) 05/12/2023 1517   PLT 317.0 05/12/2023 1517   MCV 101.0 (H) 05/12/2023 1517   MCH 34.3 (H) 04/21/2023 0958   MCHC 33.9 05/12/2023 1517   RDW 13.4 05/12/2023 1517   LYMPHSABS 2.0 05/12/2023 1517   MONOABS 0.7 05/12/2023 1517   EOSABS 0.4 05/12/2023 1517   BASOSABS 0.1 05/12/2023 1517     Parts of this note may have been dictated using voice recognition software. There may be variances in spelling and vocabulary which are unintentional. Not all errors are proofread. Please notify the [facility] if any discrepancies are noted or if the  meaning of any statement is not clear.

## 2023-11-10 ENCOUNTER — Other Ambulatory Visit: Payer: Self-pay

## 2023-11-10 ENCOUNTER — Telehealth: Payer: Self-pay

## 2023-11-10 DIAGNOSIS — R188 Other ascites: Secondary | ICD-10-CM

## 2023-11-10 NOTE — Telephone Encounter (Signed)
-----   Message from Nurse Elspeth RAMAN sent at 05/12/2023 12:20 PM EST ----- Personal reminder sent on 05/12/2023  Reminder for 6 mo US  Dr. Avram Pt

## 2023-11-10 NOTE — Telephone Encounter (Signed)
 Personal reminder received in epic. Pt was scheduled for the US  on 11/17/2023 at 9:00 AM at [facility].  Pt to arrive at 8:30 AM. NPO past midnight. Left message for pt to call back

## 2023-11-11 ENCOUNTER — Other Ambulatory Visit: Payer: Self-pay

## 2023-11-11 DIAGNOSIS — I70222 Atherosclerosis of native arteries of extremities with rest pain, left leg: Secondary | ICD-10-CM

## 2023-11-14 NOTE — Telephone Encounter (Signed)
 Pt made aware of reminder that was received. Pt was notified that that he had been US  on 11/17/2023 at 9:00 AM at [facility]. Pt to arrive at 8:30 AM. NPO past midnight.  Pt stated that that date did not work well for him. Number to radiology scheduling was given to pt to reschedule. [phone] Pt verbalized understanding with all questions answered.

## 2023-11-17 ENCOUNTER — Ambulatory Visit (HOSPITAL_COMMUNITY)

## 2023-11-26 ENCOUNTER — Other Ambulatory Visit: Payer: Self-pay | Admitting: Vascular Surgery

## 2023-11-26 ENCOUNTER — Ambulatory Visit (HOSPITAL_COMMUNITY)
Admission: RE | Admit: 2023-11-26 | Discharge: 2023-11-26 | Disposition: A | Discharge status: Home / Self Care | Source: Ambulatory Visit | Attending: Vascular Surgery | Admitting: Vascular Surgery

## 2023-11-26 ENCOUNTER — Ambulatory Visit (HOSPITAL_BASED_OUTPATIENT_CLINIC_OR_DEPARTMENT_OTHER)
Admission: RE | Admit: 2023-11-26 | Discharge: 2023-11-26 | Disposition: A | Discharge status: Home / Self Care | Source: Ambulatory Visit | Attending: Vascular Surgery | Admitting: Vascular Surgery

## 2023-11-26 ENCOUNTER — Ambulatory Visit (INDEPENDENT_AMBULATORY_CARE_PROVIDER_SITE_OTHER): Admitting: Physician Assistant

## 2023-11-26 VITALS — BP 129/87 | HR 92 | Temp 97.9°F | Ht 66.0 in | Wt 204.7 lb

## 2023-11-26 DIAGNOSIS — I70222 Atherosclerosis of native arteries of extremities with rest pain, left leg: Secondary | ICD-10-CM

## 2023-11-26 DIAGNOSIS — I739 Peripheral vascular disease, unspecified: Secondary | ICD-10-CM

## 2023-11-26 DIAGNOSIS — Z9889 Other specified postprocedural states: Secondary | ICD-10-CM

## 2023-11-26 LAB — VAS US ABI WITH/WO TBI
Left ABI: 0.93
Right ABI: 0.67

## 2023-11-27 ENCOUNTER — Encounter: Payer: Self-pay | Admitting: Physician Assistant

## 2023-11-27 ENCOUNTER — Other Ambulatory Visit: Payer: Self-pay | Admitting: *Deleted

## 2023-11-27 DIAGNOSIS — I739 Peripheral vascular disease, unspecified: Secondary | ICD-10-CM

## 2023-11-27 DIAGNOSIS — Z9889 Other specified postprocedural states: Secondary | ICD-10-CM

## 2023-11-27 NOTE — Progress Notes (Signed)
 Office Note     CC:  follow up Requesting Provider:  Randeen Laine LABOR, MD  HPI: Zachary Lewis is a 63 y.o. (1961-03-05) male who presents status post left SFA to peroneal artery bypass with vein on 04/18/2023 by Dr. Sheree.  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  Has since healed his left great toe wound.  He is ambulatory without any claudication symptoms.  He also denies any rest pain or any new tissue loss.  He is a former smoker.  Past medical history significant for insulin -dependent diabetes mellitus.  He is on aspirin  and Plavix  daily.  He is also on Repatha .  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) 08/04/2018   Diabetes mellitus    Type II   Hyperlipidemia    Hypertension    NSAID-induced gastric ulcer 12/13/2018   EGD 11/2018 H pylori neg Omeprazole  started   Obesity    Peripheral arterial disease (HCC)    Personal history of colonic adenomas 03/09/2013   Tinea pedis    Chronic    Past Surgical History:  Procedure Laterality Date   ABDOMINAL AORTOGRAM W/LOWER EXTREMITY N/A 04/15/2023   Procedure: ABDOMINAL AORTOGRAM W/LOWER EXTREMITY;  Surgeon: Pearline Norman RAMAN, MD;  Location: [facility];  Service: Cardiovascular;  Laterality: N/A;   BYPASS GRAFT FEMORAL-PERONEAL Left 04/18/2023   Procedure: LEFT FEMORAL-PERONEAL ARTERY BYPASS;  Surgeon: Sheree Penne Bruckner, MD;  Location: [facility] OR;  Service: Vascular;  Laterality: Left;   COLONOSCOPY     COLONOSCOPY W/ POLYPECTOMY  2014   IR PARACENTESIS  07/02/2018   IR TRANSCATHETER BX  07/17/2018   IR VENOGRAM HEPATIC WO HEMODYNAMIC EVALUATION  07/17/2018   myringectomy  06/2005   POLYPECTOMY     UMBILICAL HERNIA REPAIR  01/1997   VASECTOMY  07/2002   VEIN HARVEST Left 04/18/2023   Procedure: VEIN HARVEST USING GREATER SAPHENOUS VEIN;  Surgeon: Sheree Penne Bruckner, MD;  Location: [facility] OR;  Service: Vascular;   Laterality: Left;    Social History   Socioeconomic History   Marital status: Married    Spouse name: Not on file   Number of children: 5   Years of education: Not on file   Highest education level: Not on file  Occupational History   Occupation: [employer]    Employer: [employer]   Occupation: [employer]  Tobacco Use   Smoking status: Former    Current packs/day: 0.00    Types: Cigarettes    Quit date: 04/02/2023    Years since quitting: 0.6   Smokeless tobacco: Never  Vaping Use   Vaping status: Never Used  Substance and Sexual Activity   Alcohol  use: Yes    Comment: 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   No drug use   Social Drivers of Corporate investment banker Strain: Not on file  Food Insecurity: No Food Insecurity (04/18/2023)   Hunger Vital Sign    Worried About Running Out of Food in the Last Year: Never true    Ran Out of  Food in the Last Year: Never true  Transportation Needs: No Transportation Needs (04/18/2023)   PRAPARE - Administrator, Civil Service (Medical): No    Lack of Transportation (Non-Medical): No  Physical Activity: Not on file  Stress: Not on file  Social Connections: Not on file  Intimate Partner Violence: Not At Risk (04/18/2023)   Humiliation, Afraid, Rape, and Kick questionnaire    Fear of Current or Ex-Partner: No    Emotionally Abused: No    Physically Abused: No    Sexually Abused: No    Family History  Problem Relation Age of Onset   Hypertension Other    Diabetes Neg Hx    Heart disease Neg Hx    Colon cancer Neg Hx    Stomach cancer Neg Hx    Pancreatic disease Neg Hx    Colon polyps Neg Hx    Esophageal cancer Neg Hx    Rectal  cancer Neg Hx     Current Outpatient Medications  Medication Sig Dispense Refill   aspirin  EC 81 MG tablet Take 1 tablet (81 mg total) by mouth daily. Swallow whole. 90 tablet 3   clopidogrel  (PLAVIX ) 75 MG tablet Take 1 tablet (75 mg total) by mouth daily. 90 tablet 3   clotrimazole -betamethasone  (LOTRISONE ) cream Apply 1 Application topically daily. 45 g 3   Continuous Glucose Sensor (DEXCOM G7 SENSOR) MISC 1 Device by Does not apply route continuous. 9 each 0   ezetimibe  (ZETIA ) 10 MG tablet Take 1 tablet (10 mg total) by mouth daily. 30 tablet 0   glucose blood (ONETOUCH VERIO) test strip 1 each by Other route daily. And lancets 1/day 100 each 3   insulin  glargine (LANTUS  SOLOSTAR) 100 UNIT/ML Solostar Pen Inject 32 Units into the skin daily. Plus sliding scale, max dose 50 units a day 30 mL 1   Insulin  Pen Needle 31G X 8 MM MISC Inject 1 Needle into the skin daily at 6 (six) AM. 100 each 5   Lancets (ONETOUCH ULTRASOFT) lancets Use as instructed  ,1 needle  insert to lancet device to check blood glucose. 100 each 12   REPATHA  SURECLICK 140 MG/ML SOAJ ADMINISTER 1 ML UNDER THE SKIN EVERY 14 DAYS 6 mL 3   Semaglutide , 1 MG/DOSE, (OZEMPIC , 1 MG/DOSE,) 4 MG/3ML SOPN Inject 1 mg into the skin once a week. 6 mL 1   Semaglutide , 2 MG/DOSE, 8 MG/3ML SOPN Inject 2 mg as directed once a week. 9 mL 1   spironolactone  (ALDACTONE ) 50 MG tablet TAKE 1 TABLET(50 MG) BY MOUTH DAILY 90 tablet 1   tadalafil  (CIALIS ) 20 MG tablet Take 1 tablet (20 mg total) by mouth daily as needed. 60 tablet 3   tadalafil  (CIALIS ) 20 MG tablet Take 1 tablet (20 mg total) by mouth daily as needed. 50 tablet 5   thiamine  100 MG tablet Take 1 tablet (100 mg total) by mouth daily. 30 tablet 0   No current facility-administered medications for this visit.    No Known Allergies   REVIEW OF SYSTEMS:   [X]  denotes positive finding, [ ]  denotes negative finding Cardiac  Comments:  Chest pain or chest pressure:     Shortness of breath upon exertion:    Short of breath when lying flat:    Irregular heart rhythm:        Vascular    Pain in calf, thigh, or hip brought on by ambulation:    Pain in feet at night that wakes  you up from your sleep:     Blood clot in your veins:    Leg swelling:         Pulmonary    Oxygen at home:    Productive cough:     Wheezing:         Neurologic    Sudden weakness in arms or legs:     Sudden numbness in arms or legs:     Sudden onset of difficulty speaking or slurred speech:    Temporary loss of vision in one eye:     Problems with dizziness:         Gastrointestinal    Blood in stool:     Vomited blood:         Genitourinary    Burning when urinating:     Blood in urine:        Psychiatric    Major depression:         Hematologic    Bleeding problems:    Problems with blood clotting too easily:        Skin    Rashes or ulcers:        Constitutional    Fever or chills:      PHYSICAL EXAMINATION:  Vitals:   11/26/23 1344  BP: 129/87  Pulse: 92  Temp: 97.9 F (36.6 C)  TempSrc: Temporal  SpO2: 93%  Weight: 204 lb 11.2 oz (92.9 kg)  Height: 5' 6 (1.676 m)    General:  WDWN in NAD; vital signs documented above Gait: Not observed HENT: WNL, normocephalic Pulmonary: normal non-labored breathing Cardiac: regular HR Abdomen: soft, NT, no masses Skin: without rashes Vascular Exam/Pulses: brisk DP signals BLE Extremities: without ischemic changes, without Gangrene , without cellulitis; without open wounds;  Musculoskeletal: no muscle wasting or atrophy  Neurologic: A&O X 3 Psychiatric:  The pt has Normal affect.   Non-Invasive Vascular Imaging:   Left femoral to peroneal bypass widely patent  ABI/TBIToday's ABI    Today's TBIPrevious ABIPrevious TBI  +-------+---------------+-----------+------------+------------+  Right 0.67           0.51       0.90        0.75           +-------+---------------+-----------+------------+------------+  Left  0.93 (peroneal)0.53       0.99        0.73             ASSESSMENT/PLAN:: 63 y.o. male here for follow up for surveillance of PAD with history of left leg bypass  Mr. Sahr is doing well.  Since bypass surgery he healed the left great toe tip.  He is ambulating without claudication symptoms.  He is without any rest pain or tissue loss.  Left lower extremity duplex demonstrates a widely patent femoral to peroneal bypass.  He will continue aspirin  and Plavix  daily.  We will repeat left leg bypass duplex and ABI in 1 year.   Donnice Sender, PA-C [facility] [phone]  Clinic MD:   Sheree

## 2023-12-08 ENCOUNTER — Other Ambulatory Visit

## 2023-12-11 ENCOUNTER — Other Ambulatory Visit

## 2023-12-11 LAB — COMPREHENSIVE METABOLIC PANEL WITH GFR
AG Ratio: 1.4 (calc) (ref 1.0–2.5)
ALT: 39 U/L (ref 9–46)
AST: 44 U/L — ABNORMAL HIGH (ref 10–35)
Albumin: 4.4 g/dL (ref 3.6–5.1)
Alkaline phosphatase (APISO): 123 U/L (ref 35–144)
BUN: 12 mg/dL (ref 7–25)
CO2: 26 mmol/L (ref 20–32)
Calcium: 9.8 mg/dL (ref 8.6–10.3)
Chloride: 99 mmol/L (ref 98–110)
Creat: 0.75 mg/dL (ref 0.70–1.35)
Globulin: 3.2 g/dL (ref 1.9–3.7)
Glucose, Bld: 163 mg/dL — ABNORMAL HIGH (ref 65–99)
Potassium: 4 mmol/L (ref 3.5–5.3)
Sodium: 135 mmol/L (ref 135–146)
Total Bilirubin: 0.4 mg/dL (ref 0.2–1.2)
Total Protein: 7.6 g/dL (ref 6.1–8.1)
eGFR: 101 mL/min/1.73m2 (ref >=60)

## 2023-12-11 LAB — LIPID PANEL
Cholesterol: 166 mg/dL (ref <200)
HDL: 75 mg/dL (ref >=40)
LDL Cholesterol (Calc): 73 mg/dL
Non-HDL Cholesterol (Calc): 91 mg/dL (ref <130)
Total CHOL/HDL Ratio: 2.2 (calc) (ref <5.0)
Triglycerides: 97 mg/dL (ref <150)

## 2023-12-12 ENCOUNTER — Other Ambulatory Visit

## 2023-12-16 ENCOUNTER — Encounter: Payer: Self-pay | Admitting: "Endocrinology

## 2023-12-16 ENCOUNTER — Ambulatory Visit (INDEPENDENT_AMBULATORY_CARE_PROVIDER_SITE_OTHER): Admitting: "Endocrinology

## 2023-12-16 VITALS — BP 120/80 | HR 85 | Ht 66.0 in | Wt 204.0 lb

## 2023-12-16 DIAGNOSIS — E1165 Type 2 diabetes mellitus with hyperglycemia: Secondary | ICD-10-CM | POA: Diagnosis not present

## 2023-12-16 DIAGNOSIS — E7800 Pure hypercholesterolemia, unspecified: Secondary | ICD-10-CM

## 2023-12-16 DIAGNOSIS — Z794 Long term (current) use of insulin: Secondary | ICD-10-CM

## 2023-12-16 DIAGNOSIS — Z7985 Long-term (current) use of injectable non-insulin antidiabetic drugs: Secondary | ICD-10-CM

## 2023-12-16 LAB — POCT GLYCOSYLATED HEMOGLOBIN (HGB A1C): Hemoglobin A1C: 8.6 % — AB (ref 4.0–5.6)

## 2023-12-16 MED ORDER — LANCETS MISC. MISC: 1.0000 | Freq: Three times a day (TID) | 1 refills | Status: AC

## 2023-12-16 MED ORDER — PEN NEEDLES 32G X 4 MM MISC: 1.0000 | Freq: Four times a day (QID) | 2 refills | Status: AC

## 2023-12-16 NOTE — Patient Instructions (Addendum)
 Will recommend for the following change of medications to: Lantus  40-45 units once every morning Increase to ozempic  2 mg/wk   _______   Goals of DM therapy:  Morning Fasting blood sugar: 80-140  Blood sugar before meals: 80-140 Bed time blood sugar: 100-150  A1C <7%, limited only by hypoglycemia  1.Diabetes medications and their side effects discussed, including hypoglycemia    2. Check blood glucose:  a) Always check blood sugars before driving. Please see below (under hypoglycemia) on how to manage b) Check a minimum of 3 times/day or more as needed when having symptoms of hypoglycemia.   c) Try to check blood glucose before sleeping/in the middle of the night to ensure that it is remaining stable and not dropping less than 100 d) Check blood glucose more often if sick  3. Diet: a) 3 meals per day schedule b: Restrict carbs to 60-70 grams (4 servings) per meal c) Colorful vegetables - 3 servings a day, and low sugar fruit 2 servings/day Plate control method: 1/4 plate protein, 1/4 starch, 1/2 green, yellow, or red vegetables d) Avoid carbohydrate snacks unless hypoglycemic episode, or increased physical activity  4. Regular exercise as tolerated, preferably 3 or more hours a week  5. Hypoglycemia: a)  Do not drive or operate machinery without first testing blood glucose to assure it is over 90 mg%, or if dizzy, lightheaded, not feeling normal, etc, or  if foot or leg is numb or weak. b)  If blood glucose less than 70, take four 5gm Glucose tabs or 15-30 gm Glucose gel.  Repeat every 15 min as needed until blood sugar is >100 mg/dl. If hypoglycemia persists then call 911.   6. Sick day management: a) Check blood glucose more often b) Continue usual therapy if blood sugars are elevated.   7. Contact the doctor immediately if blood glucose is frequently <60 mg/dl, or an episode of severe hypoglycemia occurs (where someone had to give you glucose/  glucagon or if you passed  out from a low blood glucose), or if blood glucose is persistently >350 mg/dl, for further management  8. A change in level of physical activity or exercise and a change in diet may also affect your blood sugar. Check blood sugars more often and call if needed.  Instructions: 1. Bring glucose meter, blood glucose records on every visit for review 2. Continue to follow up with primary care physician and other providers for medical care 3. Yearly eye  and foot exam 4. Please get blood work done prior to the next appointment

## 2023-12-16 NOTE — Progress Notes (Signed)
 Outpatient Endocrinology Note Obadiah Birmingham, MD  12/16/23   Zachary Lewis [birth year] [id number]  Referring Provider: Randeen Laine LABOR, MD Primary Care Provider: Randeen Laine LABOR, MD Reason for consultation: Subjective   Assessment & Plan  Diagnoses and all orders for this visit:  Uncontrolled type 2 diabetes mellitus with hyperglycemia (HCC) -     POCT glycosylated hemoglobin (Hb A1C)  Long-term (current) use of injectable non-insulin  antidiabetic drugs  Long-term insulin  use (HCC)  Pure hypercholesterolemia  Other orders -     Insulin  Pen Needle (PEN NEEDLES) 32G X 4 MM MISC; 1 Device by Does not apply route in the morning, at noon, in the evening, and at bedtime. -     Lancets Misc. MISC; 1 each by Does not apply route in the morning, at noon, and at bedtime. May substitute to any manufacturer covered by patient's insurance.   Diabetes complicated by neuropathy Hba1c goal less than 7.0, last Hba1c was 8.6. Will recommend for the following change of medications to: Lantus  40-45 units once every morning Increase to ozempic  2 mg/wk On dexcom  Prior diarrhea with metformin  Not interested in synjardy or Metformin  XR  No known contraindications to any of above medications No history of MEN syndrome/medullary thyroid  cancer/pancreatitis or pancreatic cancer in self or family  Hyperlipidemia -04/2023: Last LDL improved from 150 to 38 -not on statin -On repatha  140 mg every 2 wks, stopped ezetimibe  10 mg every day but pt is not sure if he is still taking it   -Follow low fat diet and exercise   -Blood pressure goal <140/90 - Microalbumin/creatinine at goal < 30 three yrs ago-did not do lab, reordered  -not on ACE/ARB  -diet changes including salt restriction -limit eating outside -counseled BP targets per standards of diabetes care -Uncontrolled blood pressure can lead to retinopathy, nephropathy and cardiovascular and atherosclerotic heart disease  Reviewed  and counseled on: -A1C target -Blood sugar targets -Complications of uncontrolled diabetes  -Checking blood sugar before meals and bedtime and bring log next visit -All medications with mechanism of action and side effects -Hypoglycemia management: rule of 15's, Glucagon Emergency Kit and medical alert ID -low-carb low-fat plate-method diet -At least 20 minutes of physical activity per day -Annual dilated retinal eye exam and foot exam -compliance and follow up needs -follow up as scheduled or earlier if problem gets worse  Call if blood sugar is less than 70 or consistently above 250    Take a 15 gm snack of carbohydrate at bedtime before you go to sleep if your blood sugar is less than 100.    If you are going to fast after midnight for a test or procedure, ask your physician for instructions on how to reduce/decrease your insulin  dose.    Call if blood sugar is less than 70 or consistently above 250  -Treating a low sugar by rule of 15  (15 gms of sugar every 15 min until sugar is more than 70) If you feel your sugar is low, test your sugar to be sure If your sugar is low (less than 70), then take 15 grams of a fast acting Carbohydrate (3-4 glucose tablets or glucose gel or 4 ounces of juice or regular soda) Recheck your sugar 15 min after treating low to make sure it is more than 70 If sugar is still less than 70, treat again with 15 grams of carbohydrate          Don't drive the hour  of hypoglycemia  If unconscious/unable to eat or drink by mouth, use glucagon injection or nasal spray baqsimi and call 911. Can repeat again in 15 min if still unconscious.  Return in about 4 weeks (around 01/13/2024) for visit, lab today.   I have reviewed current medications, nurse's notes, allergies, vital signs, past medical and surgical history, family medical history, and social history for this encounter. Counseled patient on symptoms, examination findings, lab findings, imaging results,  treatment decisions and monitoring and prognosis. The patient understood the recommendations and agrees with the treatment plan. All questions regarding treatment plan were fully answered.  Obadiah Birmingham, MD  12/16/23   History of Present Illness Zachary Lewis is a 63 y.o. year old male who presents for follow up of Type 2 diabetes mellitus.  Zachary Lewis was first diagnosed in 2010.   Diabetes education +  Home diabetes regimen: Lantus  28 units once every morning Ozempic  2 mg/wk  Took rybelsus  in the past  COMPLICATIONS -  MI/Stroke -  retinopathy +  neuropathy -  nephropathy  BLOOD SUGAR DATA CGM interpretation: At today's visit, we reviewed her CGM downloads. The full report is scanned in the media. Reviewing the CGM trends, BG are elevated almost entirely across the day, with only 13% in range.  Physical Exam  BP 120/80   Pulse 85   Ht 5' 6 (1.676 m)   Wt 204 lb (92.5 kg)   SpO2 96%   BMI 32.93 kg/m    Constitutional: well developed, well nourished Head: normocephalic, atraumatic Eyes: sclera anicteric, no redness Neck: supple Lungs: normal respiratory effort Neurology: alert and oriented Skin: dry, no appreciable rashes Musculoskeletal: no appreciable defects Psychiatric: normal mood and affect Diabetic Foot Exam - Simple   No data filed      Current Medications Patient's Medications  New Prescriptions   INSULIN  PEN NEEDLE (PEN NEEDLES) 32G X 4 MM MISC    1 Device by Does not apply route in the morning, at noon, in the evening, and at bedtime.   LANCETS MISC. MISC    1 each by Does not apply route in the morning, at noon, and at bedtime. May substitute to any manufacturer covered by patient's insurance.  Previous Medications   ASPIRIN  EC 81 MG TABLET    Take 1 tablet (81 mg total) by mouth daily. Swallow whole.   CLOPIDOGREL  (PLAVIX ) 75 MG TABLET    Take 1 tablet (75 mg total) by mouth daily.   CLOTRIMAZOLE -BETAMETHASONE  (LOTRISONE ) CREAM     Apply 1 Application topically daily.   CONTINUOUS GLUCOSE SENSOR (DEXCOM G7 SENSOR) MISC    1 Device by Does not apply route continuous.   EZETIMIBE  (ZETIA ) 10 MG TABLET    Take 1 tablet (10 mg total) by mouth daily.   GLUCOSE BLOOD (ONETOUCH VERIO) TEST STRIP    1 each by Other route daily. And lancets 1/day   INSULIN  GLARGINE (LANTUS  SOLOSTAR) 100 UNIT/ML SOLOSTAR PEN    Inject 32 Units into the skin daily. Plus sliding scale, max dose 50 units a day   INSULIN  PEN NEEDLE 31G X 8 MM MISC    Inject 1 Needle into the skin daily at 6 (six) AM.   LANCETS (ONETOUCH ULTRASOFT) LANCETS    Use as instructed  ,1 needle  insert to lancet device to check blood glucose.   REPATHA  SURECLICK 140 MG/ML SOAJ    ADMINISTER 1 ML UNDER THE SKIN EVERY 14 DAYS   SEMAGLUTIDE , 2 MG/DOSE, 8  MG/3ML SOPN    Inject 2 mg as directed once a week.   SPIRONOLACTONE  (ALDACTONE ) 50 MG TABLET    TAKE 1 TABLET(50 MG) BY MOUTH DAILY   TADALAFIL  (CIALIS ) 20 MG TABLET    Take 1 tablet (20 mg total) by mouth daily as needed.   TADALAFIL  (CIALIS ) 20 MG TABLET    Take 1 tablet (20 mg total) by mouth daily as needed.   THIAMINE  100 MG TABLET    Take 1 tablet (100 mg total) by mouth daily.  Modified Medications   No medications on file  Discontinued Medications   SEMAGLUTIDE , 1 MG/DOSE, (OZEMPIC , 1 MG/DOSE,) 4 MG/3ML SOPN    Inject 1 mg into the skin once a week.    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) 08/04/2018   Diabetes mellitus    Type II   Hyperlipidemia    Hypertension    NSAID-induced gastric ulcer 12/13/2018   EGD 11/2018 H pylori neg Omeprazole  started   Obesity    Peripheral arterial disease (HCC)    Personal history of colonic adenomas 03/09/2013   Tinea pedis    Chronic    Past Surgical History Past Surgical History:  Procedure Laterality Date   ABDOMINAL  AORTOGRAM W/LOWER EXTREMITY N/A 04/15/2023   Procedure: ABDOMINAL AORTOGRAM W/LOWER EXTREMITY;  Surgeon: Pearline Norman RAMAN, MD;  Location: [facility];  Service: Cardiovascular;  Laterality: N/A;   BYPASS GRAFT FEMORAL-PERONEAL Left 04/18/2023   Procedure: LEFT FEMORAL-PERONEAL ARTERY BYPASS;  Surgeon: Sheree Penne Bruckner, MD;  Location: [facility] OR;  Service: Vascular;  Laterality: Left;   COLONOSCOPY     COLONOSCOPY W/ POLYPECTOMY  2014   IR PARACENTESIS  07/02/2018   IR TRANSCATHETER BX  07/17/2018   IR VENOGRAM HEPATIC WO HEMODYNAMIC EVALUATION  07/17/2018   myringectomy  06/2005   POLYPECTOMY     UMBILICAL HERNIA REPAIR  01/1997   VASECTOMY  07/2002   VEIN HARVEST Left 04/18/2023   Procedure: VEIN HARVEST USING GREATER SAPHENOUS VEIN;  Surgeon: Sheree Penne Bruckner, MD;  Location: [facility] OR;  Service: Vascular;  Laterality: Left;    Family History family history includes Hypertension in an other family member.  Social History Social History   Socioeconomic History   Marital status: Married    Spouse name: Not on file   Number of children: 5   Years of education: Not on file   Highest education level: Not on file  Occupational History   Occupation: [employer]    Employer: [employer]   Occupation: [employer]  Tobacco Use   Smoking status: Former    Current packs/day: 0.00    Types: Cigarettes    Quit date: 04/02/2023    Years since quitting: 0.7   Smokeless tobacco: Never  Vaping Use   Vaping status: Never Used  Substance and Sexual Activity   Alcohol  use: Yes    Comment: 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   No drug use   Social Drivers of Health  Financial Resource Strain: Not on  file  Food Insecurity: No Food Insecurity (04/18/2023)   Hunger Vital Sign    Worried About Running Out of Food in the Last Year: Never true    Ran Out of Food in the Last Year: Never true  Transportation Needs: No Transportation Needs (04/18/2023)   PRAPARE - Administrator, Civil Service (Medical): No    Lack of Transportation (Non-Medical): No  Physical Activity: Not on file  Stress: Not on file  Social Connections: Not on file  Intimate Partner Violence: Not At Risk (04/18/2023)   Humiliation, Afraid, Rape, and Kick questionnaire    Fear of Current or Ex-Partner: No    Emotionally Abused: No    Physically Abused: No    Sexually Abused: No    Lab Results  Component Value Date   HGBA1C 8.6 (A) 12/16/2023   HGBA1C 8.4 (A) 11/05/2023   HGBA1C 8.3 (A) 06/03/2023   Lab Results  Component Value Date   CHOL 166 12/11/2023   Lab Results  Component Value Date   HDL 75 12/11/2023   Lab Results  Component Value Date   LDLCALC 73 12/11/2023   Lab Results  Component Value Date   TRIG 97 12/11/2023   Lab Results  Component Value Date   CHOLHDL 2.2 12/11/2023   Lab Results  Component Value Date   CREATININE 0.75 12/11/2023   Lab Results  Component Value Date   GFR 97.50 05/12/2023   Lab Results  Component Value Date   MICROALBUR 5.9 (H) 09/19/2009      Component Value Date/Time   NA 135 12/11/2023 1135   K 4.0 12/11/2023 1135   CL 99 12/11/2023 1135   CO2 26 12/11/2023 1135   GLUCOSE 163 (H) 12/11/2023 1135   BUN 12 12/11/2023 1135   CREATININE 0.75 12/11/2023 1135   CALCIUM 9.8 12/11/2023 1135   PROT 7.6 12/11/2023 1135   ALBUMIN 4.5 05/12/2023 1517   AST 44 (H) 12/11/2023 1135   ALT 39 12/11/2023 1135   ALKPHOS 200 (H) 05/12/2023 1517   BILITOT 0.4 12/11/2023 1135   GFRNONAA >60 04/21/2023 0958   GFRAA >60 06/01/2018 0427      Latest Ref Rng & Units 12/11/2023   11:35 AM 05/12/2023    3:17 PM 04/21/2023    9:58 AM  BMP  Glucose 65 - 99  mg/dL 836  832  749   BUN 7 - 25 mg/dL 12  16  16    Creatinine 0.70 - 1.35 mg/dL 9.24  9.28  9.09   BUN/Creat Ratio 6 - 22 (calc) SEE NOTE:     Sodium 135 - 146 mmol/L 135  136  136   Potassium 3.5 - 5.3 mmol/L 4.0  4.3  4.1   Chloride 98 - 110 mmol/L 99  101  101   CO2 20 - 32 mmol/L 26  24  23    Calcium 8.6 - 10.3 mg/dL 9.8  89.9  9.3        Component Value Date/Time   WBC 6.3 05/12/2023 1517   RBC 3.79 (L) 05/12/2023 1517   HGB 13.0 05/12/2023 1517   HCT 38.3 (L) 05/12/2023 1517   PLT 317.0 05/12/2023 1517   MCV 101.0 (H) 05/12/2023 1517   MCH 34.3 (H) 04/21/2023 0958   MCHC 33.9 05/12/2023 1517   RDW 13.4 05/12/2023 1517   LYMPHSABS 2.0 05/12/2023 1517   MONOABS 0.7 05/12/2023 1517   EOSABS 0.4 05/12/2023 1517   BASOSABS  0.1 05/12/2023 1517     Parts of this note may have been dictated using voice recognition software. There may be variances in spelling and vocabulary which are unintentional. Not all errors are proofread. Please notify the dino if any discrepancies are noted or if the meaning of any statement is not clear.

## 2023-12-17 ENCOUNTER — Encounter: Payer: Self-pay | Admitting: Podiatry

## 2023-12-17 ENCOUNTER — Ambulatory Visit (INDEPENDENT_AMBULATORY_CARE_PROVIDER_SITE_OTHER): Admitting: Podiatry

## 2023-12-17 VITALS — Ht 66.0 in | Wt 204.0 lb

## 2023-12-17 DIAGNOSIS — D2372 Other benign neoplasm of skin of left lower limb, including hip: Secondary | ICD-10-CM

## 2023-12-17 DIAGNOSIS — B351 Tinea unguium: Secondary | ICD-10-CM | POA: Diagnosis not present

## 2023-12-17 DIAGNOSIS — M79675 Pain in left toe(s): Secondary | ICD-10-CM

## 2023-12-17 DIAGNOSIS — M79674 Pain in right toe(s): Secondary | ICD-10-CM

## 2023-12-17 LAB — MICROALBUMIN / CREATININE URINE RATIO
Creatinine, Urine: 79 mg/dL (ref 20–320)
Microalb Creat Ratio: 29 mg/g{creat} (ref <30)
Microalb, Ur: 2.3 mg/dL

## 2023-12-17 NOTE — Progress Notes (Signed)
   Chief Complaint  Patient presents with   Nail Problem    Pt is here for [facility].    SUBJECTIVE Patient PMHx diabetes mellitus; uncontrolled, last A1c 12/16/2023 was 8.6, presenting today for follow-up evaluation of ulcers to the distal tip of the left great toe as well as routine diabetic footcare.    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) 08/04/2018   Diabetes mellitus    Type II   Hyperlipidemia    Hypertension    NSAID-induced gastric ulcer 12/13/2018   EGD 11/2018 H pylori neg Omeprazole  started   Obesity    Peripheral arterial disease (HCC)    Personal history of colonic adenomas 03/09/2013   Tinea pedis    Chronic    No Known Allergies    OBJECTIVE General Patient is awake, alert, and oriented x 3 and in no acute distress. Derm no open wounds noted today.  There is multiple preulcerative calluses noted to the bilateral feet.  Hyperkeratotic dystrophic elongated nails also noted 1-5 bilateral Vasc  PERIPHERAL VASCULAR CATHETERIZATION 04/15/2023 Findings: Widely patent infrarenal aorta and bilateral renal arteries.  Widely patent bilateral iliac systems.  Widely patent left common femoral and profunda arteries.  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  The popliteal artery is occluded the peroneal artery is reconstituted distally which fills the DP on the foot.  The PT is chronically occluded and does not appear to reconstitute.   Neuro light touch and protective threshold sensation diminished bilaterally.   Musculoskeletal Exam No symptomatic pedal deformities noted bilateral. Muscular strength within normal limits.  Patient ambulatory.  No prior amputations  MR FOOT LEFT WO CONTRAST 04/12/2023 IMPRESSION: 1. Small ulceration at the dorsal tip of the great toe with exposed bone. Mild marrow edema in the first distal phalanx is worrisome  for early osteomyelitis.  ASSESSMENT 1. 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  Eccrine poroma/symptomatic calluses bilateral feet 4.  Pain due to onychomycosis of toenails both  PLAN OF CARE -Patient evaluated today.   -Mechanical debridement of nails 1-5 performed using a nail nipper without incident or bleeding -Excisional debridement of the hyperkeratotic skin lesions was performed today using a 312 scalpel without incident or bleeding.  Salicylic acid applied.  -Continue diabetic shoes with custom molded Plastizote insoles -Return to clinic 3 months routine footcare  *Works at [employer] x 25 years  Thresa EMERSON Sar, DPM [facility]  Dr. Thresa EMERSON Sar, DPM    [address] [place], [place] [address]                Office [phone]  Fax [phone]

## 2023-12-29 ENCOUNTER — Other Ambulatory Visit (HOSPITAL_COMMUNITY): Payer: Self-pay

## 2024-01-14 ENCOUNTER — Ambulatory Visit: Admitting: "Endocrinology

## 2024-02-09 ENCOUNTER — Encounter: Payer: Self-pay | Admitting: Internal Medicine

## 2024-02-11 ENCOUNTER — Ambulatory Visit: Admitting: "Endocrinology

## 2024-02-13 ENCOUNTER — Encounter: Payer: Self-pay | Admitting: "Endocrinology

## 2024-02-13 ENCOUNTER — Ambulatory Visit (INDEPENDENT_AMBULATORY_CARE_PROVIDER_SITE_OTHER): Admitting: "Endocrinology

## 2024-02-13 VITALS — BP 136/82 | HR 86 | Resp 16 | Ht 66.0 in | Wt 209.6 lb

## 2024-02-13 DIAGNOSIS — E1165 Type 2 diabetes mellitus with hyperglycemia: Secondary | ICD-10-CM

## 2024-02-13 DIAGNOSIS — Z7985 Long-term (current) use of injectable non-insulin antidiabetic drugs: Secondary | ICD-10-CM | POA: Diagnosis not present

## 2024-02-13 DIAGNOSIS — Z794 Long term (current) use of insulin: Secondary | ICD-10-CM

## 2024-02-13 DIAGNOSIS — E7800 Pure hypercholesterolemia, unspecified: Secondary | ICD-10-CM | POA: Diagnosis not present

## 2024-02-13 NOTE — Patient Instructions (Addendum)
 Will recommend for the following change of medications to: Lantus  46-50 units once every morning Ozempic  2 mg/wk  ____________    Goals of DM therapy:  Morning Fasting blood sugar: 80-140  Blood sugar before meals: 80-140 Bed time blood sugar: 100-150  A1C <7%, limited only by hypoglycemia  1.Diabetes medications and their side effects discussed, including hypoglycemia    2. Check blood glucose:  a) Always check blood sugars before driving. Please see below (under hypoglycemia) on how to manage b) Check a minimum of 3 times/day or more as needed when having symptoms of hypoglycemia.   c) Try to check blood glucose before sleeping/in the middle of the night to ensure that it is remaining stable and not dropping less than 100 d) Check blood glucose more often if sick  3. Diet: a) 3 meals per day schedule b: Restrict carbs to 60-70 grams (4 servings) per meal c) Colorful vegetables - 3 servings a day, and low sugar fruit 2 servings/day Plate control method: 1/4 plate protein, 1/4 starch, 1/2 green, yellow, or red vegetables d) Avoid carbohydrate snacks unless hypoglycemic episode, or increased physical activity  4. Regular exercise as tolerated, preferably 3 or more hours a week  5. Hypoglycemia: a)  Do not drive or operate machinery without first testing blood glucose to assure it is over 90 mg%, or if dizzy, lightheaded, not feeling normal, etc, or  if foot or leg is numb or weak. b)  If blood glucose less than 70, take four 5gm Glucose tabs or 15-30 gm Glucose gel.  Repeat every 15 min as needed until blood sugar is >100 mg/dl. If hypoglycemia persists then call 911.   6. Sick day management: a) Check blood glucose more often b) Continue usual therapy if blood sugars are elevated.   7. Contact the doctor immediately if blood glucose is frequently <60 mg/dl, or an episode of severe hypoglycemia occurs (where someone had to give you glucose/  glucagon or if you passed out  from a low blood glucose), or if blood glucose is persistently >350 mg/dl, for further management  8. A change in level of physical activity or exercise and a change in diet may also affect your blood sugar. Check blood sugars more often and call if needed.  Instructions: 1. Bring glucose meter, blood glucose records on every visit for review 2. Continue to follow up with primary care physician and other providers for medical care 3. Yearly eye  and foot exam 4. Please get blood work done prior to the next appointment

## 2024-02-13 NOTE — Progress Notes (Signed)
 Outpatient Endocrinology Note Zachary Birmingham, MD  02/13/24   Zachary Lewis 07-May-1961 [id number]  Referring Provider: Randeen Laine LABOR, MD Primary Care Provider: Randeen Laine LABOR, MD Reason for consultation: Subjective   Assessment & Plan  Diagnoses and all orders for this visit:  Uncontrolled type 2 diabetes mellitus with hyperglycemia (HCC)  Long-term (current) use of injectable non-insulin  antidiabetic drugs  Long-term insulin  use (HCC)  Pure hypercholesterolemia    Diabetes complicated by neuropathy Hba1c goal less than 7.0, last Hba1c was Lab Results  Component Value Date   HGBA1C 8.6 (A) 12/16/2023   HGBA1C 8.4 (A) 11/05/2023   HGBA1C 8.3 (A) 06/03/2023   Will recommend for the following change of medications to: Lantus  46-50 units once every morning Ozempic  2 mg/wk On dexcom  Prior diarrhea with metformin  Not interested in synjardy or Metformin  XR  No known contraindications to any of above medications No history of MEN syndrome/medullary thyroid  cancer/pancreatitis or pancreatic cancer in self or family  Hyperlipidemia -04/2023: Last LDL improved from 150 to 38 -not on statin -On repatha  140 mg every 2 wks, stopped ezetimibe  10 mg every day but pt is not sure if he is still taking it   -Follow low fat diet and exercise   -Blood pressure goal <140/90 - Microalbumin/creatinine at goal < 30 three yrs ago-did not do lab, reordered  -not on ACE/ARB  -diet changes including salt restriction -limit eating outside -counseled BP targets per standards of diabetes care -Uncontrolled blood pressure can lead to retinopathy, nephropathy and cardiovascular and atherosclerotic heart disease  Reviewed and counseled on: -A1C target -Blood sugar targets -Complications of uncontrolled diabetes  -Checking blood sugar before meals and bedtime and bring log next visit -All medications with mechanism of action and side effects -Hypoglycemia management: rule of  15's, Glucagon Emergency Kit and medical alert ID -low-carb low-fat plate-method diet -At least 20 minutes of physical activity per day -Annual dilated retinal eye exam and foot exam -compliance and follow up needs -follow up as scheduled or earlier if problem gets worse  Call if blood sugar is less than 70 or consistently above 250    Take a 15 gm snack of carbohydrate at bedtime before you go to sleep if your blood sugar is less than 100.    If you are going to fast after midnight for a test or procedure, ask your physician for instructions on how to reduce/decrease your insulin  dose.    Call if blood sugar is less than 70 or consistently above 250  -Treating a low sugar by rule of 15  (15 gms of sugar every 15 min until sugar is more than 70) If you feel your sugar is low, test your sugar to be sure If your sugar is low (less than 70), then take 15 grams of a fast acting Carbohydrate (3-4 glucose tablets or glucose gel or 4 ounces of juice or regular soda) Recheck your sugar 15 min after treating low to make sure it is more than 70 If sugar is still less than 70, treat again with 15 grams of carbohydrate          Don't drive the hour of hypoglycemia  If unconscious/unable to eat or drink by mouth, use glucagon injection or nasal spray baqsimi and call 911. Can repeat again in 15 min if still unconscious.  Return in about 6 weeks (around 03/24/2024).   I have reviewed current medications, nurse's notes, allergies, vital signs, past medical and surgical history,  family medical history, and social history for this encounter. Counseled patient on symptoms, examination findings, lab findings, imaging results, treatment decisions and monitoring and prognosis. The patient understood the recommendations and agrees with the treatment plan. All questions regarding treatment plan were fully answered.  Zachary Birmingham, MD  02/13/24   History of Present Illness Zachary Lewis is a 63 y.o. year  old male who presents for follow up of Type 2 diabetes mellitus.  Zachary Lewis was first diagnosed in 2010.   Diabetes education +  Home diabetes regimen: Lantus  40 units once every morning Ozempic  2 mg/wk-ran out  Took rybelsus  in the past  COMPLICATIONS -  MI/Stroke -  retinopathy +  neuropathy -  nephropathy  BLOOD SUGAR DATA CGM interpretation: At today's visit, we reviewed her CGM downloads. The full report is scanned in the media. Reviewing the CGM trends, BG are elevated almost entirely across the day, with only 12% in range.  Physical Exam  BP 136/82   Pulse 86   Resp 16   Ht 5' 6 (1.676 m)   Wt 209 lb 9.6 oz (95.1 kg)   SpO2 95%   BMI 33.83 kg/m    Constitutional: well developed, well nourished Head: normocephalic, atraumatic Eyes: sclera anicteric, no redness Neck: supple Lungs: normal respiratory effort Neurology: alert and oriented Skin: dry, no appreciable rashes Musculoskeletal: no appreciable defects Psychiatric: normal mood and affect Diabetic Foot Exam - Simple   No data filed      Current Medications Patient's Medications  New Prescriptions   No medications on file  Previous Medications   ASPIRIN  EC 81 MG TABLET    Take 1 tablet (81 mg total) by mouth daily. Swallow whole.   CLOPIDOGREL  (PLAVIX ) 75 MG TABLET    Take 1 tablet (75 mg total) by mouth daily.   CLOTRIMAZOLE -BETAMETHASONE  (LOTRISONE ) CREAM    Apply 1 Application topically daily.   CONTINUOUS GLUCOSE SENSOR (DEXCOM G7 SENSOR) MISC    1 Device by Does not apply route continuous.   EZETIMIBE  (ZETIA ) 10 MG TABLET    Take 1 tablet (10 mg total) by mouth daily.   GLUCOSE BLOOD (ONETOUCH VERIO) TEST STRIP    1 each by Other route daily. And lancets 1/day   INSULIN  GLARGINE (LANTUS  SOLOSTAR) 100 UNIT/ML SOLOSTAR PEN    Inject 32 Units into the skin daily. Plus sliding scale, max dose 50 units a day   INSULIN  PEN NEEDLE (PEN NEEDLES) 32G X 4 MM MISC    1 Device by Does not apply route  in the morning, at noon, in the evening, and at bedtime.   INSULIN  PEN NEEDLE 31G X 8 MM MISC    Inject 1 Needle into the skin daily at 6 (six) AM.   LANCETS (ONETOUCH ULTRASOFT) LANCETS    Use as instructed  ,1 needle  insert to lancet device to check blood glucose.   REPATHA  SURECLICK 140 MG/ML SOAJ    ADMINISTER 1 ML UNDER THE SKIN EVERY 14 DAYS   SEMAGLUTIDE , 2 MG/DOSE, 8 MG/3ML SOPN    Inject 2 mg as directed once a week.   SPIRONOLACTONE  (ALDACTONE ) 50 MG TABLET    TAKE 1 TABLET(50 MG) BY MOUTH DAILY   TADALAFIL  (CIALIS ) 20 MG TABLET    Take 1 tablet (20 mg total) by mouth daily as needed.   TADALAFIL  (CIALIS ) 20 MG TABLET    Take 1 tablet (20 mg total) by mouth daily as needed.   THIAMINE  100 MG TABLET  Take 1 tablet (100 mg total) by mouth daily.  Modified Medications   No medications on file  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) 08/04/2018   Diabetes mellitus    Type II   Hyperlipidemia    Hypertension    NSAID-induced gastric ulcer 12/13/2018   EGD 11/2018 H pylori neg Omeprazole  started   Obesity    Peripheral arterial disease    Personal history of colonic adenomas 03/09/2013   Tinea pedis    Chronic    Past Surgical History Past Surgical History:  Procedure Laterality Date   ABDOMINAL AORTOGRAM W/LOWER EXTREMITY N/A 04/15/2023   Procedure: ABDOMINAL AORTOGRAM W/LOWER EXTREMITY;  Surgeon: Pearline Norman RAMAN, MD;  Location: [facility];  Service: Cardiovascular;  Laterality: N/A;   BYPASS GRAFT FEMORAL-PERONEAL Left 04/18/2023   Procedure: LEFT FEMORAL-PERONEAL ARTERY BYPASS;  Surgeon: Sheree Penne Bruckner, MD;  Location: [facility] OR;  Service: Vascular;  Laterality: Left;   COLONOSCOPY     COLONOSCOPY W/ POLYPECTOMY  2014   IR PARACENTESIS  07/02/2018   IR  TRANSCATHETER BX  07/17/2018   IR VENOGRAM HEPATIC WO HEMODYNAMIC EVALUATION  07/17/2018   myringectomy  06/2005   POLYPECTOMY     UMBILICAL HERNIA REPAIR  01/1997   VASECTOMY  07/2002   VEIN HARVEST Left 04/18/2023   Procedure: VEIN HARVEST USING GREATER SAPHENOUS VEIN;  Surgeon: Sheree Penne Bruckner, MD;  Location: [facility] OR;  Service: Vascular;  Laterality: Left;    Family History family history includes Hypertension in an other family member.  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   No drug use   Social Drivers of Corporate investment banker Strain: Not on file  Food Insecurity: No Food Insecurity (04/18/2023)   Hunger Vital Sign    Worried About Running Out of Food in the Last Year: Never true    Ran Out of Food in the Last Year: Never true  Transportation Needs: No Transportation Needs (04/18/2023)   PRAPARE - Administrator, Civil Service (Medical): No    Lack of Transportation (Non-Medical): No  Physical Activity: Not on file  Stress: Not on file  Social Connections: Not on file  Intimate Partner Violence: Not At  Risk (  04/18/2023)   Humiliation, Afraid, Rape, and Kick questionnaire    Fear of Current or Ex-Partner: No    Emotionally Abused: No    Physically Abused: No    Sexually Abused: No    Lab Results  Component Value Date   HGBA1C 8.6 (A) 12/16/2023   HGBA1C 8.4 (A) 11/05/2023   HGBA1C 8.3 (A) 06/03/2023   Lab Results  Component Value Date   CHOL 166 12/11/2023   Lab Results  Component Value Date   HDL 75 12/11/2023   Lab Results  Component Value Date   LDLCALC 73 12/11/2023   Lab Results  Component Value Date   TRIG 97 12/11/2023   Lab Results  Component Value Date   CHOLHDL 2.2 12/11/2023   Lab Results  Component Value Date   CREATININE 0.75 12/11/2023   Lab Results  Component Value Date   GFR 97.50 05/12/2023   Lab Results  Component Value Date   MICROALBUR 2.3 12/16/2023      Component Value Date/Time   NA 135 12/11/2023 1135   K 4.0 12/11/2023 1135   CL 99 12/11/2023 1135   CO2 26 12/11/2023 1135   GLUCOSE 163 (H) 12/11/2023 1135   BUN 12 12/11/2023 1135   CREATININE 0.75 12/11/2023 1135   CALCIUM 9.8 12/11/2023 1135   PROT 7.6 12/11/2023 1135   ALBUMIN 4.5 05/12/2023 1517   AST 44 (H) 12/11/2023 1135   ALT 39 12/11/2023 1135   ALKPHOS 200 (H) 05/12/2023 1517   BILITOT 0.4 12/11/2023 1135   GFRNONAA >60 04/21/2023 0958   GFRAA >60 06/01/2018 0427      Latest Ref Rng & Units 12/11/2023   11:35 AM 05/12/2023    3:17 PM 04/21/2023    9:58 AM  BMP  Glucose 65 - 99 mg/dL 836  832  749   BUN 7 - 25 mg/dL 12  16  16    Creatinine 0.70 - 1.35 mg/dL 9.24  9.28  9.09   BUN/Creat Ratio 6 - 22 (calc) SEE NOTE:     Sodium 135 - 146 mmol/L 135  136  136   Potassium 3.5 - 5.3 mmol/L 4.0  4.3  4.1   Chloride 98 - 110 mmol/L 99  101  101   CO2 20 - 32 mmol/L 26  24  23    Calcium 8.6 - 10.3 mg/dL 9.8  89.9  9.3        Component Value Date/Time   WBC 6.3 05/12/2023 1517   RBC 3.79 (L) 05/12/2023 1517   HGB 13.0 05/12/2023 1517   HCT 38.3 (L) 05/12/2023  1517   PLT 317.0 05/12/2023 1517   MCV 101.0 (H) 05/12/2023 1517   MCH 34.3 (H) 04/21/2023 0958   MCHC 33.9 05/12/2023 1517   RDW 13.4 05/12/2023 1517   LYMPHSABS 2.0 05/12/2023 1517   MONOABS 0.7 05/12/2023 1517   EOSABS 0.4 05/12/2023 1517   BASOSABS 0.1 05/12/2023 1517     Parts of this note may have been dictated using voice recognition software. There may be variances in spelling and vocabulary which are unintentional. Not all errors are proofread. Please notify the dino if any discrepancies are noted or if the meaning of any statement is not clear.

## 2024-02-16 ENCOUNTER — Other Ambulatory Visit: Payer: Self-pay | Admitting: "Endocrinology

## 2024-02-16 ENCOUNTER — Encounter: Payer: Self-pay | Admitting: "Endocrinology

## 2024-02-16 DIAGNOSIS — E1165 Type 2 diabetes mellitus with hyperglycemia: Secondary | ICD-10-CM

## 2024-02-16 NOTE — Progress Notes (Addendum)
 Ghada Abbett T. Yarelie Hams, MD, CAQ Sports Medicine [facility] [address] [place], [address]  Phone: [phone]  FAX: [phone]  DIEGO ULBRICHT - 63 y.o. male  MRN [id number]  Date of Birth: [birth year]  Date: 02/18/2024  PCP: Randeen Laine LABOR, MD  Referral: Randeen Laine LABOR, MD  Chief Complaint  Patient presents with   Shoulder Pain    Left   Subjective:   BOMANI OOMMEN is a 63 y.o. very pleasant male patient with Body mass index is 34.38 kg/m. who presents with the following:  Discussed the use of AI scribe software for clinical note transcription with the patient, who gave verbal consent to proceed.  History of Present Illness MATS JEANLOUIS is a 62 year old male who presents with left shoulder pain.  His left shoulder pain has recurred, affecting the same shoulder that was previously injected. He recalls a fall approximately six months ago when he slipped in a puddle of mud, leading to persistent difficulty in lifting his arm, accompanied by weakness and stiffness.  This is distinctly different from his prior shoulder pain from several years ago, and this occurred at the time of his accident.  The pain restricts his ability to lift his arm beyond a certain point and makes it difficult to sleep on the affected side.  He is having difficulty abducting, flexing, and rotating the arm.  He does have stiffness above 140 degrees.  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  He is on Plavix , which limits his use of certain pain medications like ibuprofen. He has not been using Tylenol  or any topical treatments for the shoulder pain.   No significant neck pain  Review of Systems is noted in the HPI, as appropriate  Objective:   BP (!) 160/90   Pulse (!) 114   Temp 98.6 F (37 C) (Temporal)   Ht 5' 6  (1.676 m)   Wt 213 lb (96.6 kg)   SpO2 98%   BMI 34.38 kg/m   GEN: No acute distress; alert,appropriate. PULM: Breathing comfortably in no respiratory distress PSYCH: Normally interactive.   Left shoulder: Full range of motion at the neck and nontender at the trapezius  Mild tenderness at the [facility] joint and bicipital groove Nontender along clavicle Patient is able to passively move the shoulder to 150 degrees in abduction.  Strength testing in abduction is 3+/5 Patient is able to passively flex the shoulder to 150 degrees, however strength is 3+/5 External range of motion 3+/5 Internal range of motion 4+/5  Positive drop test  Any additional provocative maneuvers of the shoulder are equivocal distant given weakness and motion.  Laboratory and Imaging Data:  Assessment and Plan:     ICD-10-CM   1. Traumatic complete tear of left rotator cuff, initial encounter  S46.012A Ambulatory referral to Orthopedic Surgery    2. Acute shoulder pain due to trauma, left  M25.512 DG Shoulder Left   G89.11 MR Shoulder Left Wo Contrast    Ambulatory referral to Orthopedic Surgery    3. Weakness of left shoulder  R29.898 DG Shoulder Left    MR Shoulder Left Wo Contrast    Ambulatory referral to Orthopedic Surgery     Radiographs of the left shoulder shows some mild degenerative joint disease. Assessment & Plan Suspected left rotator cuff tear  with chronic pain, weakness, limited range of motion Chronic left shoulder pain with significant weakness and limited range of motion with abrupt injury 6 months ago. High suspicion of full rotator cuff tear due to clinical presentation and functional impairment. 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 - Order MRI of the left shoulder to assess for full thickness rotator cuff tear. - Recommend over-the-counter Voltaren gel for pain management, safe with current anticoagulation therapy. - If MRI confirms full tear, refer to shoulder surgeon for potential  surgical repair.  High level of suspicion for full-thickness rotator cuff tear. - If MRI shows partial tear, consider shoulder injection and physical therapy to improve strength and range of motion.  Addendum: 03/15/24 1:48 PM  Patient has a very large rotator cuff tear on MRI with a large retracted full-thickness tear of the supraspinatus and most of the infraspinatus tendons.  He is going to need a surgical consultation, and I am going to refer him to a shoulder surgeon in [place].  MR Shoulder Left Wo Contrast Result Date: 03/11/2024 EXAM DESCRIPTION: MR SHOULDER LEFT WO CONTRAST CLINICAL HISTORY: 63 years, Male, Shoulder pain, chronic, rotator cuff disorder suspected, xray done; suspect RTC tear COMPARISON: None TECHNIQUE: MRI of the shoulder was performed with multiplanar multi sequence imaging according to our usual protocol. FINDINGS: There is a large full-thickness tear of the supraspinatus tendon involving most of the infraspinatus insertional fibers as well. Fibers retracted to the glenoid rim. There is at least moderate muscle belly atrophy. Mild to moderate bursal fluid. The subscapularis and teres minor tendons are unremarkable. No significant joint effusion. No labral tear. Mild to moderate tendinopathy of the proximal biceps tendon. Mild to moderate acromioclavicular osteoarthritis. IMPRESSION: Large retracted full-thickness tears of the supraspinatus tendon and most of the infraspinatus tendon. Fibers near the glenoid rim. At least moderate muscle belly atrophy. Mild to moderate tendinopathy of the intra-articular biceps tendon. Mild to moderate acromioclavicular osteoarthritis. Electronically signed by: Reyes Frees MD 03/11/2024 03:05 PM EDT RP Workstation: [id number]     Orders placed today for conditions managed today: Orders Placed This Encounter  Procedures   DG Shoulder Left   MR Shoulder Left Wo Contrast   Ambulatory referral to Orthopedic Surgery    Disposition: No  follow-ups on file.  Dragon [employer] One speech-to-text software was used for transcription in this dictation.  Possible transcriptional errors can occur using Animal nutritionist.   Signed,  Jacques DASEN. Shontavia Mickel, MD   Outpatient Encounter Medications as of 02/18/2024  Medication Sig   aspirin  EC 81 MG tablet Take 1 tablet (81 mg total) by mouth daily. Swallow whole.   clotrimazole -betamethasone  (LOTRISONE ) cream Apply 1 Application topically daily.   Continuous Glucose Sensor (DEXCOM G7 SENSOR) MISC 1 Device by Does not apply route continuous.   ezetimibe  (ZETIA ) 10 MG tablet Take 1 tablet (10 mg total) by mouth daily.   glucose blood (ONETOUCH VERIO) test strip 1 each by Other route daily. And lancets 1/day   insulin  glargine (LANTUS  SOLOSTAR) 100 UNIT/ML Solostar Pen Inject 32 Units into the skin daily. Plus sliding scale, max dose 50 units a day   Insulin  Pen Needle (PEN NEEDLES) 32G X 4 MM MISC 1 Device by Does not apply route in the morning, at noon, in the evening, and at bedtime.   Insulin  Pen Needle 31G X 8 MM MISC Inject 1 Needle into the skin daily at 6 (six) AM.   Lancets (ONETOUCH DELICA PLUS LANCET33G) MISC USE AS DIRECTED IN THE MORNING, AT NOON,  AND AT BEDTIME   REPATHA  SURECLICK 140 MG/ML SOAJ ADMINISTER 1 ML UNDER THE SKIN EVERY 14 DAYS   Semaglutide , 2 MG/DOSE, 8 MG/3ML SOPN Inject 2 mg as directed once a week.   spironolactone  (ALDACTONE ) 50 MG tablet TAKE 1 TABLET(50 MG) BY MOUTH DAILY   tadalafil  (CIALIS ) 20 MG tablet Take 1 tablet (20 mg total) by mouth daily as needed.   tadalafil  (CIALIS ) 20 MG tablet Take 1 tablet (20 mg total) by mouth daily as needed.   thiamine  100 MG tablet Take 1 tablet (100 mg total) by mouth daily.   [DISCONTINUED] clopidogrel  (PLAVIX ) 75 MG tablet Take 1 tablet (75 mg total) by mouth daily.   [DISCONTINUED] Lancets (ONETOUCH ULTRASOFT) lancets Use as instructed  ,1 needle  insert to lancet device to check blood glucose.   No  facility-administered encounter medications on file as of 02/18/2024.

## 2024-02-17 NOTE — Telephone Encounter (Signed)
 Requested Prescriptions   Pending Prescriptions Disp Refills   Lancets (ONETOUCH DELICA PLUS LANCET33G) MISC [Pharmacy Med Name: ONE TOUCH DELICA PLUS 33G LANCETS] 100 each 12    Sig: USE AS DIRECTED IN THE MORNING, AT NOON, AND AT BEDTIME

## 2024-02-18 ENCOUNTER — Ambulatory Visit (INDEPENDENT_AMBULATORY_CARE_PROVIDER_SITE_OTHER)
Admission: RE | Admit: 2024-02-18 | Discharge: 2024-02-18 | Disposition: A | Discharge status: Home / Self Care | Source: Ambulatory Visit | Attending: Family Medicine | Admitting: Family Medicine

## 2024-02-18 ENCOUNTER — Encounter: Payer: Self-pay | Admitting: Family Medicine

## 2024-02-18 ENCOUNTER — Ambulatory Visit (INDEPENDENT_AMBULATORY_CARE_PROVIDER_SITE_OTHER): Admitting: Family Medicine

## 2024-02-18 VITALS — BP 160/90 | HR 114 | Temp 98.6°F | Ht 66.0 in | Wt 213.0 lb

## 2024-02-18 DIAGNOSIS — M25512 Pain in left shoulder: Secondary | ICD-10-CM

## 2024-02-18 DIAGNOSIS — R29898 Other symptoms and signs involving the musculoskeletal system: Secondary | ICD-10-CM

## 2024-02-18 DIAGNOSIS — G8911 Acute pain due to trauma: Secondary | ICD-10-CM | POA: Diagnosis not present

## 2024-02-18 DIAGNOSIS — S46012A Strain of muscle(s) and tendon(s) of the rotator cuff of left shoulder, initial encounter: Secondary | ICD-10-CM

## 2024-02-18 NOTE — Patient Instructions (Signed)
 Voltaren 1% gel, over the counter ?You can apply up to 4 times a day ? ?This can be applied to any joint: knee, wrist, fingers, elbows, shoulders, feet and ankles. ?Can apply to any tendon: tennis elbow, achilles, tendon, rotator cuff or any other tendon. ? ?Minimal is absorbed in the bloodstream: ok with oral anti-inflammatory or a blood thinner. ? ?Cost is about 9 dollars  ?

## 2024-02-19 ENCOUNTER — Other Ambulatory Visit (HOSPITAL_COMMUNITY): Payer: Self-pay

## 2024-02-19 ENCOUNTER — Ambulatory Visit: Payer: Self-pay | Admitting: Family Medicine

## 2024-03-02 ENCOUNTER — Encounter: Payer: Self-pay | Admitting: Family Medicine

## 2024-03-08 ENCOUNTER — Other Ambulatory Visit: Payer: Self-pay | Admitting: Cardiovascular Disease

## 2024-03-09 ENCOUNTER — Other Ambulatory Visit: Payer: Self-pay | Admitting: Cardiovascular Disease

## 2024-03-11 ENCOUNTER — Inpatient Hospital Stay: Admission: RE | Admit: 2024-03-11 | Discharge: 2024-03-11 | Attending: Family Medicine | Admitting: Family Medicine

## 2024-03-11 DIAGNOSIS — R29898 Other symptoms and signs involving the musculoskeletal system: Secondary | ICD-10-CM

## 2024-03-11 DIAGNOSIS — M25512 Pain in left shoulder: Secondary | ICD-10-CM

## 2024-03-15 NOTE — Addendum Note (Signed)
 Addended by: WATT MIRZA on: 03/15/2024 01:48 PM   Modules accepted: Orders

## 2024-03-22 ENCOUNTER — Ambulatory Visit: Admitting: Podiatry

## 2024-03-24 ENCOUNTER — Ambulatory Visit (INDEPENDENT_AMBULATORY_CARE_PROVIDER_SITE_OTHER): Admitting: Podiatry

## 2024-03-24 ENCOUNTER — Encounter: Payer: Self-pay | Admitting: Podiatry

## 2024-03-24 VITALS — Ht 66.0 in | Wt 213.0 lb

## 2024-03-24 DIAGNOSIS — M79675 Pain in left toe(s): Secondary | ICD-10-CM | POA: Diagnosis not present

## 2024-03-24 DIAGNOSIS — B351 Tinea unguium: Secondary | ICD-10-CM

## 2024-03-24 DIAGNOSIS — M79674 Pain in right toe(s): Secondary | ICD-10-CM | POA: Diagnosis not present

## 2024-03-24 DIAGNOSIS — D2372 Other benign neoplasm of skin of left lower limb, including hip: Secondary | ICD-10-CM

## 2024-03-24 NOTE — Progress Notes (Signed)
   Chief Complaint  Patient presents with   Nail Problem    Pt is here for [facility]    SUBJECTIVE Patient PMHx diabetes mellitus; uncontrolled, last A1c 12/16/2023 was 8.6, presenting today for follow-up evaluation of ulcers to the distal tip of the left great toe as well as routine diabetic footcare.    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) 08/04/2018   Diabetes mellitus    Type II   Hyperlipidemia    Hypertension    NSAID-induced gastric ulcer 12/13/2018   EGD 11/2018 H pylori neg Omeprazole  started   Obesity    Peripheral arterial disease    Personal history of colonic adenomas 03/09/2013   Tinea pedis    Chronic    No Known Allergies    OBJECTIVE General Patient is awake, alert, and oriented x 3 and in no acute distress. Derm no open wounds noted today.  There is multiple preulcerative calluses noted to the bilateral feet.  Hyperkeratotic dystrophic elongated nails also noted 1-5 bilateral Vasc  PERIPHERAL VASCULAR CATHETERIZATION 04/15/2023 Findings: Widely patent infrarenal aorta and bilateral renal arteries.  Widely patent bilateral iliac systems.  Widely patent left common femoral and profunda arteries.  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  The popliteal artery is occluded the peroneal artery is reconstituted distally which fills the DP on the foot.  The PT is chronically occluded and does not appear to reconstitute.   Neuro light touch and protective threshold sensation diminished bilaterally.   Musculoskeletal Exam No symptomatic pedal deformities noted bilateral. Muscular strength within normal limits.  Patient ambulatory.  No prior amputations  MR FOOT LEFT WO CONTRAST 04/12/2023 IMPRESSION: 1. Small ulceration at the dorsal tip of the great toe with exposed bone. Mild marrow edema in the first distal phalanx is worrisome for early  osteomyelitis.  ASSESSMENT 1. 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  Eccrine poroma/symptomatic calluses bilateral feet 4.  Pain due to onychomycosis of toenails both  PLAN OF CARE -Patient evaluated today.   -Mechanical debridement of nails 1-5 performed using a nail nipper without incident or bleeding -Excisional debridement of the hyperkeratotic skin lesions was performed today using a 312 scalpel without incident or bleeding.  Salicylic acid applied.  -Continue diabetic shoes with custom molded Plastizote insoles -Return to clinic 3 months routine footcare  *Works at [employer] x 25 years  Thresa EMERSON Sar, DPM [facility]  Dr. Thresa EMERSON Sar, DPM    [address] [place], [place] [address]                Office [phone]  Fax [phone]

## 2024-03-26 ENCOUNTER — Telehealth: Payer: Self-pay | Admitting: Family Medicine

## 2024-03-26 NOTE — Telephone Encounter (Signed)
 Referral has been faxed to [facility] - Dr Dozier.   Mychart letter sent to the patient with referral information.   They will review the referral and contact the patient directly to schedule. If the patient does not wish to wait for their call, the patient can reach out as well.

## 2024-03-26 NOTE — Telephone Encounter (Signed)
 Message sent to referrals team to follow up

## 2024-03-26 NOTE — Telephone Encounter (Signed)
 Copied from CRM [id number]. Topic: Referral - Status >> Mar 26, 2024 11:19 AM Dedra B wrote: Reason for CRM: Pt called to follow up on referral to orthopedic surgeon. Informed pt that referral had been placed. He has not heard anything. Pls call pt.

## 2024-03-28 ENCOUNTER — Other Ambulatory Visit: Payer: Self-pay | Admitting: "Endocrinology

## 2024-04-17 ENCOUNTER — Other Ambulatory Visit: Payer: Self-pay | Admitting: Cardiovascular Disease

## 2024-04-19 ENCOUNTER — Other Ambulatory Visit: Payer: Self-pay | Admitting: Physician Assistant

## 2024-04-19 DIAGNOSIS — M25561 Pain in right knee: Secondary | ICD-10-CM

## 2024-04-20 ENCOUNTER — Emergency Department (HOSPITAL_COMMUNITY)

## 2024-04-20 ENCOUNTER — Inpatient Hospital Stay (HOSPITAL_COMMUNITY)
Admission: EM | Admit: 2024-04-20 | Discharge: 2024-05-01 | DRG: 502 | Disposition: A | Discharge status: Skilled Nursing Facility | Attending: Internal Medicine | Admitting: Internal Medicine

## 2024-04-20 ENCOUNTER — Other Ambulatory Visit: Payer: Self-pay

## 2024-04-20 ENCOUNTER — Encounter (HOSPITAL_COMMUNITY): Payer: Self-pay

## 2024-04-20 DIAGNOSIS — I739 Peripheral vascular disease, unspecified: Secondary | ICD-10-CM | POA: Diagnosis present

## 2024-04-20 DIAGNOSIS — I10 Essential (primary) hypertension: Secondary | ICD-10-CM | POA: Diagnosis present

## 2024-04-20 DIAGNOSIS — S76112A Strain of left quadriceps muscle, fascia and tendon, initial encounter: Secondary | ICD-10-CM | POA: Diagnosis not present

## 2024-04-20 DIAGNOSIS — E1165 Type 2 diabetes mellitus with hyperglycemia: Secondary | ICD-10-CM

## 2024-04-20 DIAGNOSIS — M25561 Pain in right knee: Secondary | ICD-10-CM | POA: Diagnosis present

## 2024-04-20 DIAGNOSIS — R188 Other ascites: Secondary | ICD-10-CM | POA: Diagnosis present

## 2024-04-20 DIAGNOSIS — S43004A Unspecified dislocation of right shoulder joint, initial encounter: Secondary | ICD-10-CM | POA: Diagnosis present

## 2024-04-20 DIAGNOSIS — E1169 Type 2 diabetes mellitus with other specified complication: Secondary | ICD-10-CM | POA: Diagnosis present

## 2024-04-20 DIAGNOSIS — E669 Obesity, unspecified: Secondary | ICD-10-CM | POA: Diagnosis present

## 2024-04-20 DIAGNOSIS — S43005A Unspecified dislocation of left shoulder joint, initial encounter: Principal | ICD-10-CM

## 2024-04-20 LAB — CBC WITH DIFFERENTIAL/PLATELET
Abs Immature Granulocytes: 0.06 K/uL (ref 0.00–0.07)
Basophils Absolute: 0 K/uL (ref 0.0–0.1)
Basophils Relative: 0 %
Eosinophils Absolute: 0.1 K/uL (ref 0.0–0.5)
Eosinophils Relative: 1 %
HCT: 48.1 % (ref 39.0–52.0)
Hemoglobin: 16.7 g/dL (ref 13.0–17.0)
Immature Granulocytes: 1 %
Lymphocytes Relative: 13 %
Lymphs Abs: 1.6 K/uL (ref 0.7–4.0)
MCH: 34.7 pg — ABNORMAL HIGH (ref 26.0–34.0)
MCHC: 34.7 g/dL (ref 30.0–36.0)
MCV: 100 fL (ref 80.0–100.0)
Monocytes Absolute: 0.8 K/uL (ref 0.1–1.0)
Monocytes Relative: 7 %
Neutro Abs: 9.3 K/uL — ABNORMAL HIGH (ref 1.7–7.7)
Neutrophils Relative %: 78 %
Platelets: 324 K/uL (ref 150–400)
RBC: 4.81 MIL/uL (ref 4.22–5.81)
RDW: 13.8 % (ref 11.5–15.5)
WBC: 11.8 K/uL — ABNORMAL HIGH (ref 4.0–10.5)
nRBC: 0 % (ref 0.0–0.2)

## 2024-04-20 LAB — BASIC METABOLIC PANEL WITH GFR
Anion gap: 18 — ABNORMAL HIGH (ref 5–15)
BUN: 18 mg/dL (ref 8–23)
CO2: 21 mmol/L — ABNORMAL LOW (ref 22–32)
Calcium: 10.2 mg/dL (ref 8.9–10.3)
Chloride: 95 mmol/L — ABNORMAL LOW (ref 98–111)
Creatinine, Ser: 0.88 mg/dL (ref 0.61–1.24)
GFR, Estimated: >60 mL/min (ref >60)
Glucose, Bld: 377 mg/dL — ABNORMAL HIGH (ref 70–99)
Potassium: 4.4 mmol/L (ref 3.5–5.1)
Sodium: 134 mmol/L — ABNORMAL LOW (ref 135–145)

## 2024-04-20 LAB — CBG MONITORING, ED: Glucose-Capillary: 324 mg/dL — ABNORMAL HIGH (ref 70–99)

## 2024-04-20 MED ORDER — HYDROMORPHONE HCL 1 MG/ML IJ SOLN
0.5000 mg | INTRAMUSCULAR | Status: DC | PRN
  Administered 2024-04-20 – 2024-04-22 (×4): 0.5 mg via INTRAVENOUS
  Filled 2024-04-20 (×3): qty 0.5
  Filled 2024-04-20: qty 1

## 2024-04-20 MED ORDER — OXYCODONE HCL 5 MG PO TABS
5.0000 mg | ORAL_TABLET | Freq: Four times a day (QID) | ORAL | Status: DC | PRN
  Administered 2024-04-21 – 2024-04-22 (×4): 10 mg via ORAL
  Filled 2024-04-20 (×4): qty 2

## 2024-04-20 MED ORDER — INSULIN ASPART 100 UNIT/ML IJ SOLN
0.0000 [IU] | Freq: Every day | INTRAMUSCULAR | Status: DC
  Administered 2024-04-20: 4 [IU] via SUBCUTANEOUS
  Administered 2024-04-22 – 2024-04-28 (×4): 2 [IU] via SUBCUTANEOUS
  Filled 2024-04-20: qty 4
  Filled 2024-04-20 (×4): qty 2

## 2024-04-20 MED ORDER — LIDOCAINE HCL (PF) 1 % IJ SOLN
10.0000 mL | Freq: Once | INTRAMUSCULAR | Status: AC
  Administered 2024-04-20: 10 mL
  Filled 2024-04-20: qty 30

## 2024-04-20 MED ORDER — LORAZEPAM 2 MG/ML IJ SOLN
1.0000 mg | Freq: Once | INTRAMUSCULAR | Status: AC
  Administered 2024-04-20: 1 mg via INTRAVENOUS
  Filled 2024-04-20: qty 1

## 2024-04-20 MED ORDER — POLYETHYLENE GLYCOL 3350 17 G PO PACK
17.0000 g | PACK | Freq: Every day | ORAL | Status: DC | PRN
  Administered 2024-04-23 – 2024-04-27 (×3): 17 g via ORAL
  Filled 2024-04-20 (×5): qty 1

## 2024-04-20 MED ORDER — ACETAMINOPHEN 500 MG PO TABS
500.0000 mg | ORAL_TABLET | Freq: Four times a day (QID) | ORAL | Status: DC | PRN
  Administered 2024-04-24 – 2024-04-29 (×4): 500 mg via ORAL
  Filled 2024-04-20 (×4): qty 1

## 2024-04-20 MED ORDER — SODIUM CHLORIDE 0.9 % IV SOLN: INTRAVENOUS | Status: AC

## 2024-04-20 MED ORDER — PROCHLORPERAZINE EDISYLATE 10 MG/2ML IJ SOLN: 5.0000 mg | Freq: Four times a day (QID) | INTRAMUSCULAR | Status: DC | PRN

## 2024-04-20 MED ORDER — MORPHINE SULFATE (PF) 4 MG/ML IV SOLN
4.0000 mg | Freq: Once | INTRAVENOUS | Status: DC
  Filled 2024-04-20: qty 1

## 2024-04-20 MED ORDER — MORPHINE SULFATE (PF) 4 MG/ML IV SOLN
4.0000 mg | Freq: Once | INTRAVENOUS | Status: AC
  Administered 2024-04-20: 4 mg via INTRAVENOUS

## 2024-04-20 MED ORDER — INSULIN ASPART 100 UNIT/ML IJ SOLN
0.0000 [IU] | Freq: Three times a day (TID) | INTRAMUSCULAR | Status: DC
  Administered 2024-04-21: 15 [IU] via SUBCUTANEOUS
  Administered 2024-04-22: 11 [IU] via SUBCUTANEOUS
  Administered 2024-04-22: 4 [IU] via SUBCUTANEOUS
  Administered 2024-04-22: 15 [IU] via SUBCUTANEOUS
  Administered 2024-04-23 (×2): 11 [IU] via SUBCUTANEOUS
  Administered 2024-04-23: 7 [IU] via SUBCUTANEOUS
  Administered 2024-04-24: 11 [IU] via SUBCUTANEOUS
  Administered 2024-04-24: 7 [IU] via SUBCUTANEOUS
  Administered 2024-04-24: 15 [IU] via SUBCUTANEOUS
  Administered 2024-04-25: 11 [IU] via SUBCUTANEOUS
  Administered 2024-04-25 (×2): 4 [IU] via SUBCUTANEOUS
  Administered 2024-04-26: 11 [IU] via SUBCUTANEOUS
  Administered 2024-04-26: 4 [IU] via SUBCUTANEOUS
  Administered 2024-04-26 – 2024-04-27 (×2): 7 [IU] via SUBCUTANEOUS
  Administered 2024-04-27 – 2024-04-28 (×3): 4 [IU] via SUBCUTANEOUS
  Administered 2024-04-28 – 2024-04-29 (×2): 3 [IU] via SUBCUTANEOUS
  Administered 2024-04-29 – 2024-04-30 (×2): 4 [IU] via SUBCUTANEOUS
  Administered 2024-04-30: 3 [IU] via SUBCUTANEOUS
  Administered 2024-04-30: 4 [IU] via SUBCUTANEOUS
  Administered 2024-05-01: 3 [IU] via SUBCUTANEOUS
  Filled 2024-04-20: qty 15
  Filled 2024-04-20: qty 11
  Filled 2024-04-20 (×2): qty 4
  Filled 2024-04-20: qty 3
  Filled 2024-04-20: qty 11
  Filled 2024-04-20: qty 3
  Filled 2024-04-20: qty 11
  Filled 2024-04-20: qty 3
  Filled 2024-04-20 (×2): qty 4
  Filled 2024-04-20: qty 11
  Filled 2024-04-20: qty 4
  Filled 2024-04-20: qty 11
  Filled 2024-04-20: qty 7
  Filled 2024-04-20: qty 4
  Filled 2024-04-20: qty 3
  Filled 2024-04-20: qty 7
  Filled 2024-04-20: qty 15
  Filled 2024-04-20: qty 10
  Filled 2024-04-20: qty 4
  Filled 2024-04-20: qty 7
  Filled 2024-04-20: qty 11
  Filled 2024-04-20: qty 4
  Filled 2024-04-20: qty 3
  Filled 2024-04-20 (×2): qty 4

## 2024-04-20 MED ORDER — INSULIN GLARGINE-YFGN 100 UNIT/ML ~~LOC~~ SOLN
5.0000 [IU] | Freq: Two times a day (BID) | SUBCUTANEOUS | Status: DC
  Administered 2024-04-20 – 2024-04-21 (×2): 5 [IU] via SUBCUTANEOUS
  Filled 2024-04-20 (×3): qty 0.05

## 2024-04-20 MED ORDER — MELATONIN 5 MG PO TABS
5.0000 mg | ORAL_TABLET | Freq: Every evening | ORAL | Status: DC | PRN
  Administered 2024-04-23 – 2024-04-30 (×4): 5 mg via ORAL
  Filled 2024-04-20 (×4): qty 1

## 2024-04-20 NOTE — ED Provider Notes (Signed)
  Physical Exam  BP (!) 138/96 (BP Location: Left Arm)   Pulse (!) 112   Temp 97.8 F (36.6 C) (Oral)   Resp 18   Ht 5' 7 (1.702 m)   Wt 95.7 kg   SpO2 98%   BMI 33.05 kg/m   Physical Exam  Procedures  .Reduction of dislocation  Date/Time: 04/20/2024 11:43 PM  Performed by: Patt Alm Macho, MD Authorized by: Patt Alm Macho, MD  Consent: Verbal consent obtained Risks and benefits: risks, benefits and alternatives were discussed Consent given by: patient Patient understanding: patient states understanding of the procedure being performed Patient consent: the patient's understanding of the procedure matches consent given Required items: required blood products, implants, devices, and special equipment available Patient identity confirmed: verbally with patient Local anesthesia used: yes Anesthesia: hematoma block  Anesthesia: Local anesthesia used: yes Local Anesthetic: lidocaine  1% without epinephrine  Anesthetic total: 10 mL  Sedation: Patient sedated: no  Patient tolerance: patient tolerated the procedure well with no immediate complications     ED Course / MDM    Medical Decision Making I provided a substantive portion of the care of this patient.  I personally made/approved the management plan for this patient and take responsibility for the patient management.    Zachary Lewis is a 63 y.o. male here presenting with fall.  Patient slipped and fell and had a dislocated shoulder.  Also landed on the left knee.  Patient has a obvious quadricep tendon rupture on exam as well.  PA performed interscalene block but patient had a difficult reduction.  Patient then received morphine  and Ativan  and we were able to reduce the shoulder successfully.  Shoulder immobilizer was placed.  Patient also has quadricep tendon rupture clinically. Since patient is unable to use wheelchair due to the shoulder dislocation and unable to use crutches due to shoulder dislocation, we  consulted Ortho regarding surgical plan.  At this point patient will be admitted by the hospital service and Ortho will consult and likely will need to operate on the quadricep.  MRI of the left knee was ordered and pending at the time of admission.   Amount and/or Complexity of Data Reviewed Labs: ordered. Decision-making details documented in ED Course. Radiology: ordered and independent interpretation performed. Decision-making details documented in ED Course.  Risk Prescription drug management. Decision regarding hospitalization.   I provided a substantive portion of the care of this patient.  I personally made/approved the management plan for this patient and take responsibility for the patient management.           Patt Alm Macho, MD 04/20/24 [phone]

## 2024-04-20 NOTE — ED Triage Notes (Signed)
 PER EMS: pt reports he slipped and fell today when stepping on a curb. He reports left leg and right shoulder pain. He reports pain with standing. No LOC, no head injury. He does take Plavix . Previous injury to right leg and is in a splint for that. GCS 15.  BP- 180/100, HR-110, O2-95% RA

## 2024-04-20 NOTE — H&P (Signed)
 History and Physical  Zachary Lewis FMW:[id number] DOB: [birth year] DOA: 04/20/2024  Referring physician: Arloa Chroman, PA-EDP  PCP: Randeen Laine LABOR, MD  Outpatient Specialists: Cardiology, endocrinology, vascular surgery, GI. Patient coming from: Home.  Chief Complaint: Fall   HPI: Zachary Lewis is a 63 y.o. male with medical history significant for type 2 diabetes, hypertension, hyperlipidemia, peripheral artery disease on Plavix , obesity, NASH cirrhosis with ascites, prior injury to right lower extremity, who presents to the ER after a fall.  The patient slipped and fell today when stepping on a curb at work.  Endorses left leg and right shoulder pain.  Denies head injury.  No loss of consciousness.  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  In the ER, hypertensive with SBP in the 170s, tachycardic with rate of 112.  Right shoulder x-ray revealed anterior and inferior dislocation of the humeral head with likely Hill-Sachs impaction fracture.  Left knee x-ray revealed no definite acute fracture or dislocation.  Soft tissue swelling superior to the patella, suggestive of left quadricep tendon rupture.  The right shoulder was reduced in the ER.  EDP discussed the case with orthopedic surgery.  Plan for tendon rupture repair on 04/21/2024.  MRI left knee without contrast is pending.  Admitted by [facility], hospitalist service.  ED Course: Temperature 98.6.  BP 175/153, pulse 112, respiratory rate 18, O2 saturation 98% on room air.  Review of Systems: Review of systems as noted in the HPI. All other systems reviewed and are negative.   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) 08/04/2018   Diabetes mellitus    Type II   Hyperlipidemia    Hypertension    NSAID-induced gastric ulcer 12/13/2018   EGD 11/2018 H  pylori neg Omeprazole  started   Obesity    Peripheral arterial disease    Personal history of colonic adenomas 03/09/2013   Tinea pedis    Chronic   Past Surgical History:  Procedure Laterality Date   ABDOMINAL AORTOGRAM W/LOWER EXTREMITY N/A 04/15/2023   Procedure: ABDOMINAL AORTOGRAM W/LOWER EXTREMITY;  Surgeon: Pearline Norman RAMAN, MD;  Location: [facility] INVASIVE CV LAB;  Service: Cardiovascular;  Laterality: N/A;   BYPASS GRAFT FEMORAL-PERONEAL Left 04/18/2023   Procedure: LEFT FEMORAL-PERONEAL ARTERY BYPASS;  Surgeon: Sheree Penne Bruckner, MD;  Location: [facility] OR;  Service: Vascular;  Laterality: Left;   COLONOSCOPY     COLONOSCOPY W/ POLYPECTOMY  2014   IR PARACENTESIS  07/02/2018   IR TRANSCATHETER BX  07/17/2018   IR VENOGRAM HEPATIC WO HEMODYNAMIC EVALUATION  07/17/2018   myringectomy  06/2005   POLYPECTOMY     UMBILICAL HERNIA REPAIR  01/1997   VASECTOMY  07/2002   VEIN HARVEST Left 04/18/2023   Procedure: VEIN HARVEST USING GREATER SAPHENOUS VEIN;  Surgeon: Sheree Penne Bruckner, MD;  Location: [facility] OR;  Service: Vascular;  Laterality: Left;    Social History:  reports that he quit smoking about 12 months ago. His smoking use included cigarettes. He has never used smokeless tobacco. He reports current alcohol  use. He reports that he does not use drugs.   No Known Allergies  Family History  Problem Relation Age of Onset   Hypertension Other    Diabetes Neg Hx    Heart disease Neg Hx    Colon cancer Neg  Hx    Stomach cancer Neg Hx    Pancreatic disease Neg Hx    Colon polyps Neg Hx    Esophageal cancer Neg Hx    Rectal cancer Neg Hx       Prior to Admission medications   Medication Sig Start Date End Date Taking? Authorizing Provider  aspirin  EC 81 MG tablet Take 1 tablet (81 mg total) by mouth daily. Swallow whole. 03/27/21   Darron Deatrice LABOR, MD  clopidogrel  (PLAVIX ) 75 MG tablet TAKE 1 TABLET(75 MG) BY MOUTH DAILY 03/09/24   Darron Deatrice LABOR, MD   clotrimazole -betamethasone  (LOTRISONE ) cream Apply 1 Application topically daily. 06/23/23   Janit Thresa HERO, DPM  Continuous Glucose Sensor (DEXCOM G7 SENSOR) MISC 1 Device by Does not apply route continuous. 04/29/23   Motwani, Komal, MD  ezetimibe  (ZETIA ) 10 MG tablet Take 1 tablet (10 mg total) by mouth daily. 04/16/23   Lue Elsie BROCKS, MD  glucose blood [facility] VERIO) test strip 1 each by Other route daily. And lancets 1/day 01/26/21   Kassie Mallick, MD  insulin  glargine (LANTUS  SOLOSTAR) 100 UNIT/ML Solostar Pen INJECT 28 UNITS INTO THE SKIN DAILY. MAX DOSE 50 UNITS A DAY 03/29/24   Motwani, Obadiah, MD  Insulin  Pen Needle (PEN NEEDLES) 32G X 4 MM MISC 1 Device by Does not apply route in the morning, at noon, in the evening, and at bedtime. 12/16/23   Motwani, Komal, MD  Insulin  Pen Needle 31G X 8 MM MISC Inject 1 Needle into the skin daily at 6 (six) AM. 05/09/23   Motwani, Obadiah, MD  Lancets (ONETOUCH DELICA PLUS LANCET33G) MISC USE AS DIRECTED IN THE MORNING, AT NOON, AND AT BEDTIME 02/17/24   Motwani, Komal, MD  REPATHA  SURECLICK 140 MG/ML SOAJ ADMINISTER 1 ML UNDER THE SKIN EVERY 14 DAYS 10/03/23   Darron Deatrice LABOR, MD  Semaglutide , 2 MG/DOSE, 8 MG/3ML SOPN Inject 2 mg as directed once a week. 11/05/23   Motwani, Komal, MD  spironolactone  (ALDACTONE ) 50 MG tablet TAKE 1 TABLET(50 MG) BY MOUTH DAILY 10/27/23   Tower, Laine LABOR, MD  tadalafil  (CIALIS ) 20 MG tablet Take 1 tablet (20 mg total) by mouth daily as needed. 12/02/22     tadalafil  (CIALIS ) 20 MG tablet Take 1 tablet (20 mg total) by mouth daily as needed. 07/10/23     thiamine  100 MG tablet Take 1 tablet (100 mg total) by mouth daily. 06/02/18   Dennise Lavada POUR, MD    Physical Exam: BP (!) 138/96 (BP Location: Left Arm)   Pulse (!) 112   Temp 98.6 F (37 C) (Oral)   Resp 18   Ht 5' 7 (1.702 m)   Wt 95.7 kg   SpO2 98%   BMI 33.05 kg/m   General: 63 y.o. year-old male well developed well nourished in no acute distress.  Alert  and oriented x3. Cardiovascular: Regular rate and rhythm with no rubs or gallops.  No thyromegaly or JVD noted.  No lower extremity edema. 2/4 pulses in all 4 extremities. Respiratory: Clear to auscultation with no wheezes or rales. Good inspiratory effort. Abdomen: Soft nontender nondistended with normal bowel sounds x4 quadrants. Muskuloskeletal: No cyanosis, clubbing or edema noted bilaterally Neuro: CN II-XII intact, strength, sensation, reflexes Skin: No ulcerative lesions noted or rashes Psychiatry: Judgement and insight appear normal. Mood is appropriate for condition and setting          Labs on Admission:  Basic Metabolic Panel: Recent Labs  Lab 04/20/24 1947  NA 134*  K 4.4  CL 95*  CO2 21*  GLUCOSE 377*  BUN 18  CREATININE 0.88  CALCIUM 10.2   Liver Function Tests: No results for input(s): AST, ALT, ALKPHOS, BILITOT, PROT, ALBUMIN in the last 168 hours. No results for input(s): LIPASE, AMYLASE in the last 168 hours. No results for input(s): AMMONIA in the last 168 hours. CBC: Recent Labs  Lab 04/20/24 1947  WBC 11.8*  NEUTROABS 9.3*  HGB 16.7  HCT 48.1  MCV 100.0  PLT 324   Cardiac Enzymes: No results for input(s): CKTOTAL, CKMB, CKMBINDEX, TROPONINI in the last 168 hours.  BNP (last 3 results) No results for input(s): BNP in the last 8760 hours.  ProBNP (last 3 results) No results for input(s): PROBNP in the last 8760 hours.  CBG: No results for input(s): GLUCAP in the last 168 hours.  Radiological Exams on Admission: DG Shoulder Right Port Result Date: 04/20/2024 CLINICAL DATA:  Shoulder dislocation, postreduction. EXAM: RIGHT SHOULDER - 1 VIEW COMPARISON:  Radiograph earlier today FINDINGS: The previous anterior shoulder dislocation has been reduced. Moderate Hill-Sachs impaction injury to the lateral humeral head. Mild irregularity about the inferior glenoid may represent fragmented osteophyte or bony [place]. The  acromioclavicular joint is normally aligned. IMPRESSION: 1. Interval reduction of anterior shoulder dislocation. 2. Moderate impaction injury to the lateral humeral head. 3. Mild irregularity about the inferior glenoid may represent fragmented osteophyte or bony [place]. Electronically Signed   By: Andrea Gasman M.D.   On: 04/20/2024 20:30   DG Knee Complete 4 Views Left Result Date: 04/20/2024 CLINICAL DATA:  Injury mixed field EXAM: LEFT KNEE - COMPLETE 4+ VIEW COMPARISON:  None Available. FINDINGS: There is soft tissue swelling superior to the patella. There some soft tissue calcifications in this region which may be related to old injury. Difficult to assess for joint effusion secondary to flexion of the knee. No definite acute fracture or dislocation identified. Joint spaces are well maintained. Peripheral vascular calcifications are present. Rounded calcification posterior to the knee measures 2.1 cm, possibly popliteal aneurysm. IMPRESSION: 1. No definite acute fracture or dislocation. 2. Soft tissue swelling superior to the patella. 3. Rounded calcification posterior to the knee measures 2.1 cm, possibly popliteal aneurysm. This can be further evaluated with ultrasound. Electronically Signed   By: Greig Pique M.D.   On: 04/20/2024 19:34   DG Shoulder Right Result Date: 04/20/2024 CLINICAL DATA:  injury, hurt right shoulder and left knee after fall at work today EXAM: RIGHT SHOULDER - 2+ VIEW COMPARISON:  None Available. FINDINGS: Anterior and inferior dislocation of the humeral head. Moderate AC joint space loss. Likely impaction fracture along the posterosuperior humeral head. Soft tissues are unremarkable. IMPRESSION: Anterior and inferior dislocation of the humeral head with likely Hill-Sachs impaction fracture. Electronically Signed   By: Rogelia Myers M.D.   On: 04/20/2024 18:34    EKG: I independently viewed the EKG done and my findings are as followed: None available at the time of  this visit.  Assessment/Plan Present on Admission:  Rupture of quadriceps tendon, left, initial encounter  Principal Problem:   Rupture of quadriceps tendon, left, initial encounter  Rupture of left quadriceps tendon, post mechanical fall, POA Pain control as needed, IV fluid hydration Plan for surgical repair on 04/21/2024 Fall precautions.  Type 2 diabetes with hyperglycemia Last hemoglobin A1c 8.6 on 12/16/2023 Long-acting and short acting insulin  coverage. N.p.o. after midnight for surgery.  Hypertension BP is not at goal Resume home oral antihypertensives.  Closely monitor vital signs.  Hyperlipidemia Resume home regimen.  Peripheral vascular disease on Plavix  Resume home Plavix  when okay with orthopedic surgery.  Obesity BMI 33 Recommend weight loss outpatient with regular physical activity and healthy dieting.   Time: 75 minutes.   DVT prophylaxis: SCDs.  Code Status: Full code.  Family Communication: None at bedside.  Disposition Plan: Admitted to telemetry unit.  Consults called: Orthopedic surgery consulted by EDP.  Admission status: Inpatient status.   Status is: Inpatient The patient requires at least 2 midnights for further evaluation and treatment of present condition.   Terry LOISE Hurst MD [facility] Pager [phone]  If 7PM-7AM, please contact night-coverage [email or web address] Password [email or web address]  04/20/2024, 8:59 PM

## 2024-04-20 NOTE — Telephone Encounter (Signed)
Please contact pt for future appointment. Pt overdue for follow up.

## 2024-04-20 NOTE — Progress Notes (Signed)
 Orthopedic Tech Progress Note Patient Details:  Zachary Lewis [birth year] [id number]  Ortho Devices Type of Ortho Device: Shoulder immobilizer, Knee Immobilizer Ortho Device/Splint Location: left knee immobilizer applied. right sling applied Ortho Device/Splint Interventions: Ordered, Application, Adjustment   Post Interventions Patient Tolerated: Well Instructions Provided: Adjustment of device, Care of device  Waylan Thom Loving 04/20/2024, 8:22 PM

## 2024-04-20 NOTE — ED Provider Notes (Signed)
  EMERGENCY DEPARTMENT AT [facility] Provider Note   CSN: [id number] Arrival date & time: 04/20/24  1733     Patient presents with: Zachary Lewis is a 63 y.o. male.  {Add pertinent medical, surgical, social history, OB history to YEP:67052}  Fall       Prior to Admission medications   Medication Sig Start Date End Date Taking? Authorizing Provider  aspirin  EC 81 MG tablet Take 1 tablet (81 mg total) by mouth daily. Swallow whole. 03/27/21   Darron Deatrice LABOR, MD  clopidogrel  (PLAVIX ) 75 MG tablet TAKE 1 TABLET(75 MG) BY MOUTH DAILY 03/09/24   Darron Deatrice LABOR, MD  clotrimazole -betamethasone  (LOTRISONE ) cream Apply 1 Application topically daily. 06/23/23   Janit Thresa HERO, DPM  Continuous Glucose Sensor (DEXCOM G7 SENSOR) MISC 1 Device by Does not apply route continuous. 04/29/23   Motwani, Komal, MD  ezetimibe  (ZETIA ) 10 MG tablet Take 1 tablet (10 mg total) by mouth daily. 04/16/23   Lue Elsie BROCKS, MD  glucose blood [facility] VERIO) test strip 1 each by Other route daily. And lancets 1/day 01/26/21   Kassie Mallick, MD  insulin  glargine (LANTUS  SOLOSTAR) 100 UNIT/ML Solostar Pen INJECT 28 UNITS INTO THE SKIN DAILY. MAX DOSE 50 UNITS A DAY 03/29/24   Motwani, Obadiah, MD  Insulin  Pen Needle (PEN NEEDLES) 32G X 4 MM MISC 1 Device by Does not apply route in the morning, at noon, in the evening, and at bedtime. 12/16/23   Motwani, Komal, MD  Insulin  Pen Needle 31G X 8 MM MISC Inject 1 Needle into the skin daily at 6 (six) AM. 05/09/23   Motwani, Obadiah, MD  Lancets (ONETOUCH DELICA PLUS LANCET33G) MISC USE AS DIRECTED IN THE MORNING, AT NOON, AND AT BEDTIME 02/17/24   Motwani, Komal, MD  REPATHA  SURECLICK 140 MG/ML SOAJ ADMINISTER 1 ML UNDER THE SKIN EVERY 14 DAYS 10/03/23   Darron Deatrice LABOR, MD  Semaglutide , 2 MG/DOSE, 8 MG/3ML SOPN Inject 2 mg as directed once a week. 11/05/23   Motwani, Komal, MD  spironolactone  (ALDACTONE ) 50 MG tablet TAKE 1 TABLET(50  MG) BY MOUTH DAILY 10/27/23   Tower, Laine LABOR, MD  tadalafil  (CIALIS ) 20 MG tablet Take 1 tablet (20 mg total) by mouth daily as needed. 12/02/22     tadalafil  (CIALIS ) 20 MG tablet Take 1 tablet (20 mg total) by mouth daily as needed. 07/10/23     thiamine  100 MG tablet Take 1 tablet (100 mg total) by mouth daily. 06/02/18   Singh, Prashant K, MD    Allergies: Patient has no known allergies.    Review of Systems  Updated Vital Signs BP (!) 138/96 (BP Location: Left Arm)   Pulse (!) 112   Temp 98.6 F (37 C) (Oral)   Resp 18   Ht 5' 7 (1.702 m)   Wt 95.7 kg   SpO2 98%   BMI 33.05 kg/m   Physical Exam  (all labs ordered are listed, but only abnormal results are displayed) Labs Reviewed  CBC WITH DIFFERENTIAL/PLATELET  BASIC METABOLIC PANEL WITH GFR    EKG: None  Radiology: DG Shoulder Right Result Date: 04/20/2024 CLINICAL DATA:  injury, hurt right shoulder and left knee after fall at work today EXAM: RIGHT SHOULDER - 2+ VIEW COMPARISON:  None Available. FINDINGS: Anterior and inferior dislocation of the humeral head. Moderate AC joint space loss. Likely impaction fracture along the posterosuperior humeral head. Soft tissues are unremarkable. IMPRESSION: Anterior and inferior dislocation  of the humeral head with likely Hill-Sachs impaction fracture. Electronically Signed   By: Rogelia Myers M.D.   On: 04/20/2024 18:34    {Document cardiac monitor, telemetry assessment procedure when appropriate:32947} Procedures   Medications Ordered in the ED  lidocaine  (PF) (XYLOCAINE ) 1 % injection 10 mL (has no administration in time range)  morphine  (PF) 4 MG/ML injection 4 mg (has no administration in time range)      {Click here for ABCD2, HEART and other calculators REFRESH Note before signing:1}                              Medical Decision Making Amount and/or Complexity of Data Reviewed Labs: ordered. Radiology: ordered.  Risk Prescription drug  management.   ***  {Document critical care time when appropriate  Document review of labs and clinical decision tools ie CHADS2VASC2, etc  Document your independent review of radiology images and any outside records  Document your discussion with family members, caretakers and with consultants  Document social determinants of health affecting pt's care  Document your decision making why or why not admission, treatments were needed:32947:::1}   Final diagnoses:  None    ED Discharge Orders     None

## 2024-04-20 NOTE — ED Provider Notes (Incomplete)
 [place] EMERGENCY DEPARTMENT AT [facility] Provider Note   CSN: [id number] Arrival date & time: 04/20/24  1733     Patient presents with: Zachary Lewis is a 63 y.o. male.  Presents the emergency department for injury after fall.  Patient recently injured his right knee and states that he has been wearing a brace but has been having pain.  He slipped off of a curb fell onto his left knee knee directly and hit his right shoulder.  Denies hitting his head.  Since that time has had severe pain in his right shoulder has been unable to move it has no previous history of shoulder dislocation.  Reports that he is unable to ambulate on the left leg because he is unable to extend the knee.  He denies numbness or tingling or any other neurologic compromise.  {Add pertinent medical, surgical, social history, OB history to YEP:67052}  Fall       Prior to Admission medications   Medication Sig Start Date End Date Taking? Authorizing Provider  aspirin  EC 81 MG tablet Take 1 tablet (81 mg total) by mouth daily. Swallow whole. 03/27/21   Darron Deatrice LABOR, MD  clopidogrel  (PLAVIX ) 75 MG tablet TAKE 1 TABLET(75 MG) BY MOUTH DAILY 03/09/24   Darron Deatrice LABOR, MD  clotrimazole -betamethasone  (LOTRISONE ) cream Apply 1 Application topically daily. 06/23/23   Janit Thresa HERO, DPM  Continuous Glucose Sensor (DEXCOM G7 SENSOR) MISC 1 Device by Does not apply route continuous. 04/29/23   Motwani, Komal, MD  ezetimibe  (ZETIA ) 10 MG tablet Take 1 tablet (10 mg total) by mouth daily. 04/16/23   Lue Elsie BROCKS, MD  glucose blood [facility] VERIO) test strip 1 each by Other route daily. And lancets 1/day 01/26/21   Kassie Mallick, MD  insulin  glargine (LANTUS  SOLOSTAR) 100 UNIT/ML Solostar Pen INJECT 28 UNITS INTO THE SKIN DAILY. MAX DOSE 50 UNITS A DAY 03/29/24   Motwani, Komal, MD  Insulin  Pen Needle (PEN NEEDLES) 32G X 4 MM MISC 1 Device by Does not apply route in the morning, at noon, in  the evening, and at bedtime. 12/16/23   Motwani, Komal, MD  Insulin  Pen Needle 31G X 8 MM MISC Inject 1 Needle into the skin daily at 6 (six) AM. 05/09/23   Motwani, Obadiah, MD  Lancets (ONETOUCH DELICA PLUS LANCET33G) MISC USE AS DIRECTED IN THE MORNING, AT NOON, AND AT BEDTIME 02/17/24   Motwani, Komal, MD  REPATHA  SURECLICK 140 MG/ML SOAJ ADMINISTER 1 ML UNDER THE SKIN EVERY 14 DAYS 10/03/23   Darron Deatrice LABOR, MD  Semaglutide , 2 MG/DOSE, 8 MG/3ML SOPN Inject 2 mg as directed once a week. 11/05/23   Motwani, Komal, MD  spironolactone  (ALDACTONE ) 50 MG tablet TAKE 1 TABLET(50 MG) BY MOUTH DAILY 10/27/23   Tower, Laine LABOR, MD  tadalafil  (CIALIS ) 20 MG tablet Take 1 tablet (20 mg total) by mouth daily as needed. 12/02/22     tadalafil  (CIALIS ) 20 MG tablet Take 1 tablet (20 mg total) by mouth daily as needed. 07/10/23     thiamine  100 MG tablet Take 1 tablet (100 mg total) by mouth daily. 06/02/18   Singh, Prashant K, MD    Allergies: Patient has no known allergies.    Review of Systems  Updated Vital Signs BP (!) 138/96 (BP Location: Left Arm)   Pulse (!) 112   Temp 98.6 F (37 C) (Oral)   Resp 18   Ht 5' 7 (1.702 m)  Wt 95.7 kg   SpO2 98%   BMI 33.05 kg/m   Physical Exam Vitals and nursing note reviewed.  Constitutional:      General: He is not in acute distress.    Appearance: He is well-developed. He is not diaphoretic.  HENT:     Head: Normocephalic and atraumatic.  Eyes:     General: No scleral icterus.    Conjunctiva/sclera: Conjunctivae normal.  Cardiovascular:     Rate and Rhythm: Normal rate and regular rhythm.     Heart sounds: Normal heart sounds.  Pulmonary:     Effort: Pulmonary effort is normal. No respiratory distress.     Breath sounds: Normal breath sounds.  Abdominal:     Palpations: Abdomen is soft.     Tenderness: There is no abdominal tenderness.  Musculoskeletal:     Cervical back: Normal range of motion and neck supple.     Comments: Right shoulder with  positive sulcus sign, normal ipsilateral elbow and wrist exam, neurovascularly intact. Left knee with positive sulcus just above the left patella.  No ability to extend the knee.  Knee is otherwise intact.  Appears highly concerning for quadricep tendon disruption.  Skin:    General: Skin is warm and dry.  Neurological:     Mental Status: He is alert.  Psychiatric:        Behavior: Behavior normal.     (all labs ordered are listed, but only abnormal results are displayed) Labs Reviewed  CBC WITH DIFFERENTIAL/PLATELET  BASIC METABOLIC PANEL WITH GFR    EKG: None  Radiology: DG Shoulder Right Result Date: 04/20/2024 CLINICAL DATA:  injury, hurt right shoulder and left knee after fall at work today EXAM: RIGHT SHOULDER - 2+ VIEW COMPARISON:  None Available. FINDINGS: Anterior and inferior dislocation of the humeral head. Moderate AC joint space loss. Likely impaction fracture along the posterosuperior humeral head. Soft tissues are unremarkable. IMPRESSION: Anterior and inferior dislocation of the humeral head with likely Hill-Sachs impaction fracture. Electronically Signed   By: Rogelia Myers M.D.   On: 04/20/2024 18:34    {Document cardiac monitor, telemetry assessment procedure when appropriate:32947} .Nerve Block  Date/Time: 04/20/2024 11:42 PM  Performed by: Arloa Chroman, PA-C Authorized by: Arloa Chroman, PA-C   Consent:    Consent obtained:  Verbal   Consent given by:  Patient   Risks, benefits, and alternatives were discussed: yes     Risks discussed:  Nerve damage, swelling, bleeding, intravenous injection, pain and unsuccessful block (partial paralysis of the diaphragm, pneumothorax) Universal protocol:    Patient identity confirmed:  Verbally with patient and arm band Indications:    Indications:  Pain relief and procedural anesthesia Location:    Body area:  Upper extremity   Upper extremity nerve blocked: Right interscalene block.   Laterality:   Right Pre-procedure details:    Skin preparation:  Chlorhexidine    Preparation: Patient was prepped and draped in usual sterile fashion   Procedure details:    Block needle gauge:  27 G   Guidance: ultrasound     Anesthetic injected:  Lidocaine  1% w/o epi   Steroid injected:  None   Additive injected:  None   Injection procedure:  Anatomic landmarks identified, incremental injection and negative aspiration for blood   Paresthesia:  None Post-procedure details:    Outcome:  Pain improved   Procedure completion:  Tolerated well, no immediate complications    Medications Ordered in the ED  lidocaine  (PF) (XYLOCAINE ) 1 % injection 10  mL (has no administration in time range)  morphine  (PF) 4 MG/ML injection 4 mg (has no administration in time range)      {Click here for ABCD2, HEART and other calculators REFRESH Note before signing:1}                              Medical Decision Making Amount and/or Complexity of Data Reviewed Labs: ordered. Radiology: ordered.  Risk Prescription drug management. Decision regarding hospitalization.   This is a 63 year old male who fell with right shoulder and left knee pain.  Initial right shoulder x-ray which I visualized and interpreted showed dislocation  {Document critical care time when appropriate  Document review of labs and clinical decision tools ie CHADS2VASC2, etc  Document your independent review of radiology images and any outside records  Document your discussion with family members, caretakers and with consultants  Document social determinants of health affecting pt's care  Document your decision making why or why not admission, treatments were needed:32947:::1}   Final diagnoses:  None    ED Discharge Orders     None

## 2024-04-21 ENCOUNTER — Ambulatory Visit: Admitting: "Endocrinology

## 2024-04-21 ENCOUNTER — Inpatient Hospital Stay (HOSPITAL_COMMUNITY): Admitting: Anesthesiology

## 2024-04-21 ENCOUNTER — Inpatient Hospital Stay (HOSPITAL_COMMUNITY)

## 2024-04-21 ENCOUNTER — Other Ambulatory Visit: Payer: Self-pay

## 2024-04-21 ENCOUNTER — Encounter (HOSPITAL_COMMUNITY)
Admission: EM | Disposition: A | Discharge status: Skilled Nursing Facility | Payer: Self-pay | Source: Home / Self Care | Attending: Internal Medicine

## 2024-04-21 DIAGNOSIS — S43004A Unspecified dislocation of right shoulder joint, initial encounter: Secondary | ICD-10-CM | POA: Diagnosis present

## 2024-04-21 DIAGNOSIS — M25561 Pain in right knee: Secondary | ICD-10-CM | POA: Diagnosis present

## 2024-04-21 DIAGNOSIS — S76112A Strain of left quadriceps muscle, fascia and tendon, initial encounter: Secondary | ICD-10-CM

## 2024-04-21 HISTORY — PX: QUADRICEPS TENDON REPAIR: SHX756

## 2024-04-21 LAB — GLUCOSE, CAPILLARY
Glucose-Capillary: 303 mg/dL — ABNORMAL HIGH (ref 70–99)
Glucose-Capillary: 243 mg/dL — ABNORMAL HIGH (ref 70–99)
Glucose-Capillary: 141 mg/dL — ABNORMAL HIGH (ref 70–99)
Glucose-Capillary: 109 mg/dL — ABNORMAL HIGH (ref 70–99)
Glucose-Capillary: 119 mg/dL — ABNORMAL HIGH (ref 70–99)
Glucose-Capillary: 155 mg/dL — ABNORMAL HIGH (ref 70–99)

## 2024-04-21 LAB — SURGICAL PCR SCREEN
MRSA, PCR: NEGATIVE
Staphylococcus aureus: NEGATIVE

## 2024-04-21 LAB — HIV ANTIBODY (ROUTINE TESTING W REFLEX): HIV Screen 4th Generation wRfx: NONREACTIVE

## 2024-04-21 SURGERY — REPAIR, TENDON, QUADRICEPS
Anesthesia: General | Laterality: Left

## 2024-04-21 MED ORDER — BUPIVACAINE-EPINEPHRINE (PF) 0.25% -1:200000 IJ SOLN
INTRAMUSCULAR | Status: AC
  Filled 2024-04-21: qty 30

## 2024-04-21 MED ORDER — DROPERIDOL 2.5 MG/ML IJ SOLN: 0.6250 mg | Freq: Once | INTRAMUSCULAR | Status: DC | PRN

## 2024-04-21 MED ORDER — BISACODYL 5 MG PO TBEC
5.0000 mg | DELAYED_RELEASE_TABLET | Freq: Every day | ORAL | Status: DC | PRN
  Administered 2024-04-24 – 2024-04-26 (×2): 5 mg via ORAL
  Filled 2024-04-21 (×3): qty 1

## 2024-04-21 MED ORDER — MUPIROCIN 2 % EX OINT
1.0000 | TOPICAL_OINTMENT | Freq: Two times a day (BID) | CUTANEOUS | Status: DC
  Administered 2024-04-21 – 2024-04-25 (×9): 1 via NASAL
  Filled 2024-04-21 (×3): qty 22

## 2024-04-21 MED ORDER — LACTATED RINGERS IV SOLN: INTRAVENOUS | Status: DC | PRN

## 2024-04-21 MED ORDER — HYDROMORPHONE HCL 1 MG/ML IJ SOLN
INTRAMUSCULAR | Status: AC
  Filled 2024-04-21: qty 1

## 2024-04-21 MED ORDER — CHLORHEXIDINE GLUCONATE 4 % EX SOLN
60.0000 mL | Freq: Once | CUTANEOUS | Status: DC
  Filled 2024-04-21: qty 60

## 2024-04-21 MED ORDER — FENTANYL CITRATE (PF) 250 MCG/5ML IJ SOLN
INTRAMUSCULAR | Status: DC | PRN
  Administered 2024-04-21 (×3): 50 ug via INTRAVENOUS

## 2024-04-21 MED ORDER — GADOBUTROL 1 MMOL/ML IV SOLN
9.0000 mL | Freq: Once | INTRAVENOUS | Status: AC | PRN
  Administered 2024-04-21: 9 mL via INTRAVENOUS

## 2024-04-21 MED ORDER — BUPIVACAINE-EPINEPHRINE (PF) 0.25% -1:200000 IJ SOLN
INTRAMUSCULAR | Status: DC | PRN
  Administered 2024-04-21: 30 mL

## 2024-04-21 MED ORDER — LIDOCAINE 2% (20 MG/ML) 5 ML SYRINGE
INTRAMUSCULAR | Status: DC | PRN
  Administered 2024-04-21: 100 mg via INTRAVENOUS

## 2024-04-21 MED ORDER — CEFAZOLIN SODIUM-DEXTROSE 2-4 GM/100ML-% IV SOLN
2.0000 g | INTRAVENOUS | Status: AC
  Administered 2024-04-21: 2 g via INTRAVENOUS

## 2024-04-21 MED ORDER — POVIDONE-IODINE 10 % EX SWAB: 2.0000 | Freq: Once | CUTANEOUS | Status: DC

## 2024-04-21 MED ORDER — PHENYLEPHRINE 80 MCG/ML (10ML) SYRINGE FOR IV PUSH (FOR BLOOD PRESSURE SUPPORT)
PREFILLED_SYRINGE | INTRAVENOUS | Status: DC | PRN
  Administered 2024-04-21: 160 ug via INTRAVENOUS

## 2024-04-21 MED ORDER — MIDAZOLAM HCL (PF) 2 MG/2ML IJ SOLN
INTRAMUSCULAR | Status: DC | PRN
  Administered 2024-04-21: 2 mg via INTRAVENOUS

## 2024-04-21 MED ORDER — 0.9 % SODIUM CHLORIDE (POUR BTL) OPTIME
TOPICAL | Status: DC | PRN
  Administered 2024-04-21: 1000 mL

## 2024-04-21 MED ORDER — INSULIN GLARGINE-YFGN 100 UNIT/ML ~~LOC~~ SOLN
15.0000 [IU] | Freq: Two times a day (BID) | SUBCUTANEOUS | Status: DC
  Administered 2024-04-22 – 2024-04-23 (×3): 15 [IU] via SUBCUTANEOUS
  Filled 2024-04-21 (×5): qty 0.15

## 2024-04-21 MED ORDER — ONDANSETRON HCL 4 MG PO TABS: 4.0000 mg | ORAL_TABLET | Freq: Four times a day (QID) | ORAL | Status: DC | PRN

## 2024-04-21 MED ORDER — FENTANYL CITRATE (PF) 100 MCG/2ML IJ SOLN
INTRAMUSCULAR | Status: AC
  Filled 2024-04-21: qty 2

## 2024-04-21 MED ORDER — HYDROCODONE-ACETAMINOPHEN 5-325 MG PO TABS
1.0000 | ORAL_TABLET | ORAL | 0 refills | Status: DC | PRN
  Filled 2024-04-21 – 2024-04-23 (×2): qty 30, 5d supply, fill #0

## 2024-04-21 MED ORDER — MIDAZOLAM HCL 2 MG/2ML IJ SOLN
INTRAMUSCULAR | Status: AC
  Filled 2024-04-21: qty 2

## 2024-04-21 MED ORDER — DEXAMETHASONE SOD PHOSPHATE PF 10 MG/ML IJ SOLN
INTRAMUSCULAR | Status: DC | PRN
  Administered 2024-04-21: 10 mg via INTRAVENOUS

## 2024-04-21 MED ORDER — FLEET ENEMA RE ENEM
1.0000 | ENEMA | Freq: Once | RECTAL | Status: AC | PRN
  Administered 2024-04-27: 1 via RECTAL
  Filled 2024-04-21: qty 1

## 2024-04-21 MED ORDER — ASPIRIN 81 MG PO TBEC
81.0000 mg | DELAYED_RELEASE_TABLET | Freq: Every day | ORAL | Status: AC
  Administered 2024-04-22 – 2024-05-01 (×10): 81 mg via ORAL
  Filled 2024-04-21 (×10): qty 1

## 2024-04-21 MED ORDER — PROPOFOL 10 MG/ML IV BOLUS
INTRAVENOUS | Status: DC | PRN
  Administered 2024-04-21: 120 mg via INTRAVENOUS

## 2024-04-21 MED ORDER — SUCCINYLCHOLINE CHLORIDE 200 MG/10ML IV SOSY
PREFILLED_SYRINGE | INTRAVENOUS | Status: DC | PRN
  Administered 2024-04-21: 90 mg via INTRAVENOUS

## 2024-04-21 MED ORDER — CEFAZOLIN SODIUM-DEXTROSE 2-4 GM/100ML-% IV SOLN
2.0000 g | Freq: Four times a day (QID) | INTRAVENOUS | Status: AC
  Administered 2024-04-22 (×3): 2 g via INTRAVENOUS
  Filled 2024-04-21 (×3): qty 100

## 2024-04-21 MED ORDER — HYDROMORPHONE HCL 1 MG/ML IJ SOLN
0.2500 mg | INTRAMUSCULAR | Status: DC | PRN
  Administered 2024-04-21 (×4): 0.5 mg via INTRAVENOUS

## 2024-04-21 MED ORDER — DOCUSATE SODIUM 100 MG PO CAPS
100.0000 mg | ORAL_CAPSULE | Freq: Two times a day (BID) | ORAL | Status: DC
  Administered 2024-04-22 – 2024-05-01 (×19): 100 mg via ORAL
  Filled 2024-04-21 (×19): qty 1

## 2024-04-21 MED ORDER — ONDANSETRON HCL 4 MG/2ML IJ SOLN
INTRAMUSCULAR | Status: DC | PRN
  Administered 2024-04-21: 4 mg via INTRAVENOUS

## 2024-04-21 MED ORDER — ONDANSETRON HCL 4 MG/2ML IJ SOLN: 4.0000 mg | Freq: Four times a day (QID) | INTRAMUSCULAR | Status: DC | PRN

## 2024-04-21 SURGICAL SUPPLY — 37 items
BAG COUNTER SPONGE SURGICOUNT (BAG) IMPLANT
BIT DRILL 2.0X128 (BIT) ×1 IMPLANT
BLADE SURG SZ10 CARB STEEL (BLADE) ×1 IMPLANT
BNDG COHESIVE 6X5 TAN ST LF (GAUZE/BANDAGES/DRESSINGS) ×1 IMPLANT
BNDG COMPR ESMARK 6X3 LF (GAUZE/BANDAGES/DRESSINGS) ×1 IMPLANT
BNDG ELASTIC 6INX 5YD STR LF (GAUZE/BANDAGES/DRESSINGS) ×1 IMPLANT
CHLORAPREP W/TINT 26 (MISCELLANEOUS) ×1 IMPLANT
CLSR STERI-STRIP ANTIMIC 1/2X4 (GAUZE/BANDAGES/DRESSINGS) ×1 IMPLANT
CUFF TRNQT CYL 34X4.125X (TOURNIQUET CUFF) ×1 IMPLANT
DRAPE BILATERAL LIMB T (DRAPES) IMPLANT
DRAPE U-SHAPE 47X51 STRL (DRAPES) ×1 IMPLANT
DRSG ADAPTIC 3X8 NADH LF (GAUZE/BANDAGES/DRESSINGS) ×1 IMPLANT
DRSG MEPILEX POST OP 4X8 (GAUZE/BANDAGES/DRESSINGS) IMPLANT
ELECT REM PT RETURN 15FT ADLT (MISCELLANEOUS) ×1 IMPLANT
GAUZE PAD ABD 8X10 STRL (GAUZE/BANDAGES/DRESSINGS) ×1 IMPLANT
GAUZE SPONGE 4X4 12PLY STRL (GAUZE/BANDAGES/DRESSINGS) ×1 IMPLANT
GAUZE XEROFORM 1X8 LF (GAUZE/BANDAGES/DRESSINGS) IMPLANT
GLOVE BIO SURGEON STRL SZ8 (GLOVE) ×1 IMPLANT
GLOVE BIOGEL PI IND STRL 8 (GLOVE) ×1 IMPLANT
GOWN STRL REUS W/TWL 2XL LVL3 (GOWN DISPOSABLE) ×1 IMPLANT
IMMOBILIZER KNEE 22 UNIV (SOFTGOODS) IMPLANT
NDL MA TROC 1/2 (NEEDLE) ×1 IMPLANT
NDL SUT 6 .5 CRC .975X.05 MAYO (NEEDLE) IMPLANT
PACK ORTHO EXTREMITY (CUSTOM PROCEDURE TRAY) ×1 IMPLANT
PADDING CAST COTTON 6X4 STRL (CAST SUPPLIES) ×1 IMPLANT
RETRIEVER SUT HEWSON (MISCELLANEOUS) IMPLANT
SPIKE FLUID TRANSFER (MISCELLANEOUS) IMPLANT
STOCKINETTE 8 INCH (MISCELLANEOUS) ×1 IMPLANT
SUT MNCRL AB 4-0 PS2 18 (SUTURE) ×1 IMPLANT
SUT VIC AB 0 CT1 27XBRD ANBCTR (SUTURE) IMPLANT
SUT VIC AB 0 CT1 36 (SUTURE) ×1 IMPLANT
SUT VIC AB 1 CT1 36 (SUTURE) ×2 IMPLANT
SUT VIC AB 2-0 SH 27XBRD (SUTURE) ×2 IMPLANT
SUTURE FIBERWR#2 38 REV NDL BL (SUTURE) IMPLANT
TOWEL OR DSP ST BLU DLX 10/PK (DISPOSABLE) ×1 IMPLANT
UNDERPAD 30X36 HEAVY ABSORB (UNDERPADS AND DIAPERS) ×1 IMPLANT
YANKAUER SUCT BULB TIP 10FT TU (MISCELLANEOUS) ×1 IMPLANT

## 2024-04-21 NOTE — Plan of Care (Signed)
   Problem: Education: Goal: Knowledge of General Education information will improve Description: Including pain rating scale, medication(s)/side effects and non-pharmacologic comfort measures Outcome: Progressing   Problem: Clinical Measurements: Goal: Ability to maintain clinical measurements within normal limits will improve Outcome: Progressing

## 2024-04-21 NOTE — Consult Note (Signed)
 Orthopedic Consult  Patient ID: Zachary Lewis MRN: [id number] DOB/AGE: 09/20/1960 63 y.o.  Reason for Consult: Left knee and right shoulder pain Referring Physician: Barbarann  HPI: Zachary Lewis is an 63 y.o. male who fell yesterday.  He was seen in the emergency room at [facility].  He was found to have a right shoulder dislocation which was closed reduced in the emergency room.  He also had left knee pain.  Prior to this approximate week and a half ago he had injured his right knee.  This is more of a bruise and has been sore and is able to ambulate.  His more recent pain started after a fall yesterday involve the left knee and right shoulder.  He denies any antecedent pain to these areas.  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) 08/04/2018   Diabetes mellitus    Type II   Hyperlipidemia    Hypertension    NSAID-induced gastric ulcer 12/13/2018   EGD 11/2018 H pylori neg Omeprazole  started   Obesity    Peripheral arterial disease    Personal history of colonic adenomas 03/09/2013   Tinea pedis    Chronic    Past Surgical History:  Procedure Laterality Date   ABDOMINAL AORTOGRAM W/LOWER EXTREMITY N/A 04/15/2023   Procedure: ABDOMINAL AORTOGRAM W/LOWER EXTREMITY;  Surgeon: Pearline Norman RAMAN, MD;  Location: [facility] INVASIVE CV LAB;  Service: Cardiovascular;  Laterality: N/A;   BYPASS GRAFT FEMORAL-PERONEAL Left 04/18/2023   Procedure: LEFT FEMORAL-PERONEAL ARTERY BYPASS;  Surgeon: Sheree Penne Bruckner, MD;  Location: [facility] OR;  Service: Vascular;  Laterality: Left;   COLONOSCOPY     COLONOSCOPY W/ POLYPECTOMY  2014   IR PARACENTESIS  07/02/2018   IR TRANSCATHETER BX  07/17/2018   IR VENOGRAM HEPATIC WO HEMODYNAMIC EVALUATION  07/17/2018   myringectomy  06/2005   POLYPECTOMY     UMBILICAL HERNIA REPAIR  01/1997   VASECTOMY  07/2002   VEIN HARVEST Left 04/18/2023    Procedure: VEIN HARVEST USING GREATER SAPHENOUS VEIN;  Surgeon: Sheree Penne Bruckner, MD;  Location[facility];  Service: Vascular;  Laterality: Left;    Family History  Problem Relation Age of Onset   Hypertension Other    Diabetes Neg Hx    Heart disease Neg Hx    Colon cancer Neg Hx    Stomach cancer Neg Hx    Pancreatic disease Neg Hx    Colon polyps Neg Hx    Esophageal cancer Neg Hx    Rectal cancer Neg Hx     Social History:  reports that he quit smoking about 12 months ago. His smoking use included cigarettes. He has never used smokeless tobacco. He reports current alcohol  use. He reports that he does not use drugs.  Allergies: No Known Allergies  Medications: I have reviewed the patient's current medications.  ROS: Constitutional: No fever or chills Vision: No changes in vision ENT: No difficulty swallowing CV: No chest pain Pulm: No SOB or wheezing GI: No nausea or vomiting GU: No urgency or inability to hold urine Skin: No poor wound healing Neurologic: No numbness or tingling Psychiatric: No depression or anxiety Heme: No bruising Allergic: No reaction to medications or food   Exam: Blood pressure (!) 142/84, pulse 83, temperature (!) 97.5 F (36.4 C), temperature source Oral, resp. rate 17, height 5' 7 (1.702 m), weight  95.7 kg, SpO2 100%. General: Well-appearing gentleman no acute distress Orientation: Alert and oriented Mood and Affect: Mood is calm   Injured Extremity (CV, lymph, sensation, reflexes): Examination of the right shoulder reveals a located shoulder.  Tender about the right shoulder.  Arm is held in a sling.  He has intact sensation in the axillary, musculotendinous, radial, ulnar and median nerve distributions.  Intact motor function in the AIN and ulnar distributions.  Weakness with abduction of the thumb which is per baseline but otherwise the PIN nerve is intact  Examination of the left knee reveals a palpable defect above the patella  on the left side.  He is able to actively extend his knee.  Mild bruising.  Intact station in the saphenous, sural, tibial, and peroneal nerve distributions 5 out of 5 strength EHL, FHL, gastrocs  Examination of the right knee reveals bruising.  He has no instability varus valgus stress.  Negative anterior posterior stress.  Normal range of motion.  No tense of his about the left upper extremity no defect or deformity     Medical Decision Making: Data: Imaging: X-rays of the right shoulder reveal a right shoulder dislocation.  Subsequent x-rays show successful relocation.  Likely Hill-Sachs lesion  X-rays of the left knee reveal a low riding patella but no other fractures abnormalities  Labs: White cell count of 11.8, hemoglobin 16.7, Mehta crit 48.1, glucose 377  Imaging or Labs ordered: MRI left knee and right shoulder  Medical history and chart was reviewed and case discussed with medical provider.  Assessment/Plan: The patient has 2 major injuries.  The first is the left leg which has a quadriceps tendon rupture.  Will get an MRI to confirm this but is evident on clinical examination.  This require surgical correction.  This will be an open reduction and open repair of the left quadriceps tendon.  We discussed the risks, benefits, alternatives to surgery.  The risks include but are not limited to bleeding, infection, injury to the tendon, extensor lag, patellar arthritis, and the nutritional surgeries.  We also discussed the postoperative dilatation course and the potential for stiffness.  His second concern is the right shoulder dislocation.  This appears to be well relocated.  There is only concern for the Hill-Sachs lesion but also possibly of a rotator cuff type injury.  Will go ahead and get an MRI scan of this as well to further assess it.  For now we will keep him in the sling with no range of motion and nonweightbearing of the right arm.  In the long-term, further treatment based  on the findings of the MRI scan.  He does have some right knee discomfort.  This is likely bruising.  This is not an acute issue but will certainly compound his ability to ambulate and mobilize after surgery.  At this point we will plan for surgery today for his left quadricep tendon stair  New problem w/ workup planned: High complexity diagnosis (Level 5) Surgery w/ risks or Emergency surgery: High complexity Risk (Level 5)  All others are Level 4 with comprehensive musculoskeletal exam.  Cordella Rhein, MD, MS [facility] / [place] [phone]

## 2024-04-21 NOTE — Discharge Instructions (Addendum)
 Orthopaedic Discharge Instructions   General Discharge Instructions  WEIGHT BEARING STATUS: Weightbearing as tolerated  RANGE OF MOTION/ACTIVITY: Knee immobilizer to left leg, no flexion of left knee  Wound Care: You may remove your surgical dressing on 04/28/24. Incisions can be left open to air if there is no drainage. Once the incision is completely dry and without drainage, it may be left open to air out.  Showering may begin 04-28-24.  Clean incision gently with soap and water.  DVT/PE prophylaxis: Current Plavix  dose  Diet: as you were eating previously.  Can use over the counter stool softeners and bowel preparations, such as Miralax , to help with bowel movements.  Narcotics can be constipating.  Be sure to drink plenty of fluids  PAIN MEDICATION USE AND EXPECTATIONS  You have likely been given narcotic medications to help control your pain.  After a traumatic event that results in an fracture (broken bone) with or without surgery, it is ok to use narcotic pain medications to help control one's pain.  We understand that everyone responds to pain differently and each individual patient will be evaluated on a regular basis for the continued need for narcotic medications. Ideally, narcotic medication use should last no more than 6-8 weeks (coinciding with fracture healing).   As a patient it is your responsibility as well to monitor narcotic medication use and report the amount and frequency you use these medications when you come to your office visit.   We would also advise that if you are using narcotic medications, you should take a dose prior to therapy to maximize you participation.  IF YOU ARE ON NARCOTIC MEDICATIONS IT IS NOT PERMISSIBLE TO OPERATE A MOTOR VEHICLE (MOTORCYCLE/CAR/TRUCK/MOPED) OR HEAVY MACHINERY DO NOT MIX NARCOTICS WITH OTHER CNS (CENTRAL NERVOUS SYSTEM) DEPRESSANTS SUCH AS ALCOHOL   POST-OPERATIVE OPIOID TAPER INSTRUCTIONS: It is important to wean off of your  opioid medication as soon as possible. If you do not need pain medication after your surgery it is ok to stop day one. Opioids include: Codeine, Hydrocodone (Norco, Vicodin), Oxycodone (Percocet, oxycontin ) and hydromorphone  amongst others.  Long term and even short term use of opiods can cause: Increased pain response Dependence Constipation Depression Respiratory depression And more.  Withdrawal symptoms can include Flu like symptoms Nausea, vomiting And more Techniques to manage these symptoms Hydrate well Eat regular healthy meals Stay active Use relaxation techniques(deep breathing, meditating, yoga) Do Not substitute Alcohol  to help with tapering If you have been on opioids for less than two weeks and do not have pain than it is ok to stop all together.  Plan to wean off of opioids This plan should start within one week post op of your fracture surgery  Maintain the same interval or time between taking each dose and first decrease the dose.  Cut the total daily intake of opioids by one tablet each day Next start to increase the time between doses. The last dose that should be eliminated is the evening dose.    STOP SMOKING OR USING NICOTINE  PRODUCTS!!!!  As discussed nicotine  severely impairs your body's ability to heal surgical and traumatic wounds but also impairs bone healing.  Wounds and bone heal by forming microscopic blood vessels (angiogenesis) and nicotine  is a vasoconstrictor (essentially, shrinks blood vessels).  Therefore, if vasoconstriction occurs to these microscopic blood vessels they essentially disappear and are unable to deliver necessary nutrients to the healing tissue.  This is one modifiable factor that you can do to dramatically increase your chances of healing  your injury.  (This means no smoking, no nicotine  gum, patches, etc)  DO NOT USE NONSTEROIDAL ANTI-INFLAMMATORY DRUGS (NSAID'S)  Using products such as Advil (ibuprofen), Aleve  (naproxen ), Motrin  (ibuprofen) for additional pain control during fracture healing can delay and/or prevent the healing response.  If you would like to take over the counter (OTC) medication, Tylenol  (acetaminophen ) is ok.  However, some narcotic medications that are given for pain control contain acetaminophen  as well. Therefore, you should not exceed more than 4000 mg of tylenol  in a day if you do not have liver disease.  Also note that there are may OTC medicines, such as cold medicines and allergy medicines that my contain tylenol  as well.  If you have any questions about medications and/or interactions please ask your doctor/PA or your pharmacist.      ICE AND ELEVATE INJURED/OPERATIVE EXTREMITY  Using ice and elevating the injured extremity above your heart can help with swelling and pain control.  Icing in a pulsatile fashion, such as 20 minutes on and 20 minutes off, can be followed.    Do not place ice directly on skin. Make sure there is a barrier between to skin and the ice pack.    Using frozen items such as frozen peas works well as the conform nicely to the are that needs to be iced.  USE AN ACE WRAP OR TED HOSE FOR SWELLING CONTROL  In addition to icing and elevation, Ace wraps or TED hose are used to help limit and resolve swelling.  It is recommended to use Ace wraps or TED hose until you are informed to stop.    When using Ace Wraps start the wrapping distally (farthest away from the body) and wrap proximally (closer to the body)   Example: If you had surgery on your leg or thing and you do not have a splint on, start the ace wrap at the toes and work your way up to the thigh        If you had surgery on your upper extremity and do not have a splint on, start the ace wrap at your fingers and work your way up to the upper arm  changes as noted below.  Wash the liner of the boot regularly and wear a sock when wearing the boot. It is recommended that you sleep in the boot until told otherwise   CALL THE  OFFICE FOR MEDICATION REFILLS OR WITH ANY QUESTIONS/CONCERNS: [phone]         Notify the office/Doctor if you experience increasing drainage, redness, or pain from a pin site, or if you notice purulent (thick, snot-like) drainage.   Discharge Wound Care Instructions  Do NOT apply any ointments, solutions or lotions to pin sites or surgical wounds.  These prevent needed drainage and even though solutions like hydrogen peroxide kill bacteria, they also damage cells lining the pin sites that help fight infection.  Applying lotions or ointments can keep the wounds moist and can cause them to breakdown and open up as well. This can increase the risk for infection. When in doubt call the office.  Surgical incisions should be dressed daily.  If any drainage is noted, use one layer of adaptic or Mepitel, then gauze, Kerlix, and an ace wrap. - These dressing supplies should be available at local medical supply stores ([employer] [employer], [facility], etc) as well as insurance claims handler ([employer], [facility], [employer], etc)  Once the incision is completely dry and without drainage, it may be left  open to air out.  Showering may begin 36-48 hours later.  Cleaning gently with soap and water.  Traumatic wounds should be dressed daily as well.    One layer of adaptic, gauze, Kerlix, then ace wrap.  The adaptic can be discontinued once the draining has ceased    If you have a wet to dry dressing: wet the gauze with saline the squeeze as much saline out so the gauze is moist (not soaking wet), place moistened gauze over wound, then place a dry gauze over the moist one, followed by Kerlix wrap, then ace wrap.    Call office for the following: Temperature greater than 101F Persistent nausea and vomiting Severe uncontrolled pain Redness, tenderness, or signs of infection (pain, swelling, redness, odor or green/yellow discharge around the site) Difficulty breathing, headache or visual  disturbances Hives Persistent dizziness or light-headedness Extreme fatigue Any other questions or concerns you may have after discharge  In an emergency, call 911 or go to an Emergency Department at a nearby hospital  OTHER HELPFUL INFORMATION  If you had a block, it will wear off between 8-24 hrs postop typically.  This is period when your pain may go from nearly zero to the pain you would have had postop without the block.  This is an abrupt transition but nothing dangerous is happening.  You may take an extra dose of narcotic when this happens.  You should wean off your narcotic medicines as soon as you are able.  Most patients will be off or using minimal narcotics before their first postop appointment.   We suggest you use the pain medication the first night prior to going to bed, in order to ease any pain when the anesthesia wears off. You should avoid taking pain medications on an empty stomach as it will make you nauseous.  Do not drink alcoholic beverages or take illicit drugs when taking pain medications.  In most states it is against the law to drive while you are in a splint or sling.  And certainly against the law to drive while taking narcotics.  You may return to work/school in the next couple of days when you feel up to it.   Pain medication may make you constipated.  Below are a few solutions to try in this order: Decrease the amount of pain medication if you aren't having pain. Drink lots of decaffeinated fluids. Drink prune juice and/or each dried prunes  If the first 3 don't work start with additional solutions Take Colace - an over-the-counter stool softener Take Senokot - an over-the-counter laxative Take Miralax  - a stronger over-the-counter laxative   Follow up with Dr Reyne in 2 weeks  Cordella Reyne, MD, MS [facility]ist / [place] [phone]

## 2024-04-21 NOTE — Assessment & Plan Note (Addendum)
 Body mass index is 33.05 kg/m.[name]  Weight loss should be encouraged on an ongoing basis Resume semaglutide  after discharge Outpatient PCP/bariatric medicine f/u encouraged Significantly low or high BMI is associated with higher medical risk including morbidity and mortality

## 2024-04-21 NOTE — Assessment & Plan Note (Signed)
 In addition to the traumatic injuries as above, he reports R knee pain occurring from a prior fall He is in a knee brace and is requesting an MRI for this issue R knee MRI was ordered on 12/1 by PA Glendia Long (appears to work for [facility], an Urgent Care with records not currently available) Will order R knee MRI for tomorrow

## 2024-04-21 NOTE — Anesthesia Procedure Notes (Signed)
 Procedure Name: Intubation Date/Time: 04/21/2024 9:06 PM  Performed by: Cena Epps, CRNAPre-anesthesia Checklist: Patient identified, Emergency Drugs available, Suction available and Patient being monitored Patient Re-evaluated:Patient Re-evaluated prior to induction Oxygen Delivery Method: Circle System Utilized Preoxygenation: Pre-oxygenation with 100% oxygen Induction Type: IV induction and Rapid sequence Laryngoscope Size: Glidescope and 4 (loose front teeth) Grade View: Grade I Tube type: Oral Tube size: 7.5 mm Number of attempts: 1 Airway Equipment and Method: Stylet and Oral airway Placement Confirmation: ETT inserted through vocal cords under direct vision, positive ETCO2 and breath sounds checked- equal and bilateral Secured at: 23 cm Tube secured with: Tape Dental Injury: Teeth and Oropharynx as per pre-operative assessment

## 2024-04-21 NOTE — Transfer of Care (Signed)
 Immediate Anesthesia Transfer of Care Note  Patient: Zachary Lewis  Procedure(s) Performed: REPAIR, TENDON, QUADRICEPS (Left)  Patient Location: PACU  Anesthesia Type:General  Level of Consciousness: drowsy and patient cooperative  Airway & Oxygen Therapy: Patient Spontanous Breathing  Post-op Assessment: Report given to RN and Post -op Vital signs reviewed and stable  Post vital signs: Reviewed and stable  Last Vitals:  Vitals Value Taken Time  BP 142/83 04/21/24 22:06  Temp    Pulse 84 04/21/24 22:10  Resp 25 04/21/24 22:10  SpO2 97 % 04/21/24 22:10  Vitals shown include unfiled device data.  Last Pain:  Vitals:   04/21/24 1225  TempSrc: Oral  PainSc:          Complications: No notable events documented.

## 2024-04-21 NOTE — Plan of Care (Signed)

## 2024-04-21 NOTE — Assessment & Plan Note (Signed)
 No longer taking Zetia 

## 2024-04-21 NOTE — Interval H&P Note (Signed)
 History and Physical Interval Note:  04/21/2024 8:24 PM  Zachary Lewis  has presented today for surgery, with the diagnosis of RUPTURED LEFT QUAD TENDON.  The various methods of treatment have been discussed with the patient and family. After consideration of risks, benefits and other options for treatment, the patient has consented to  Procedure(s): REPAIR, TENDON, QUADRICEPS (Left) as a surgical intervention.  The patient's history has been reviewed, patient examined, no change in status, stable for surgery.  I have reviewed the patient's chart and labs.  Questions were answered to the patient's satisfaction.     Cordella SHAUNNA Rhein

## 2024-04-21 NOTE — Assessment & Plan Note (Addendum)
 Mechanical fall x 2 MRI with ruptured quadriceps tendon at distal patellar attachment Pain control as needed Plan for surgical repair on 04/21/2024 Fall precautions

## 2024-04-21 NOTE — Assessment & Plan Note (Addendum)
 Last A1c was 8.6, poor control Hold semaglutide  Continue glargine at altered dose (currently 15 units BID rather than 40 units daily) Cover with moderate-scale SSI Carb modified diet

## 2024-04-21 NOTE — Anesthesia Preprocedure Evaluation (Addendum)
 Anesthesia Evaluation  Patient identified by MRN, date of birth, ID band Patient awake    Reviewed: Allergy & Precautions, H&P , NPO status , Patient's Chart, lab work & pertinent test results  Airway Mallampati: III  TM Distance: >3 FB Neck ROM: Full    Dental  (+) Dental Advisory Given, Teeth Intact, Loose   Pulmonary Patient abstained from smoking., former smoker   Pulmonary exam normal breath sounds clear to auscultation       Cardiovascular hypertension, + Peripheral Vascular Disease  Normal cardiovascular exam Rhythm:Regular Rate:Normal     Neuro/Psych negative neurological ROS  negative psych ROS   GI/Hepatic PUD,,,(+) Cirrhosis       NASH   Endo/Other  diabetes, Poorly Controlled, Type 2    Renal/GU negative Renal ROS     Musculoskeletal negative musculoskeletal ROS (+)    Abdominal  (+) + obese  Peds  Hematology negative hematology ROS (+)   Anesthesia Other Findings   Reproductive/Obstetrics                              Anesthesia Physical Anesthesia Plan  ASA: 3 and emergent  Anesthesia Plan: General   Post-op Pain Management: Ofirmev  IV (intra-op)*   Induction: Intravenous  PONV Risk Score and Plan: 3 and Ondansetron , Dexamethasone and Treatment may vary due to age or medical condition  Airway Management Planned: Oral ETT  Additional Equipment:   Intra-op Plan:   Post-operative Plan: Extubation in OR  Informed Consent: I have reviewed the patients History and Physical, chart, labs and discussed the procedure including the risks, benefits and alternatives for the proposed anesthesia with the patient or authorized representative who has indicated his/her understanding and acceptance.     Dental advisory given  Plan Discussed with: CRNA  Anesthesia Plan Comments: (PAT note written 04/16/2023 by Allison Zelenak, PA-C.  )        Anesthesia Quick  Evaluation

## 2024-04-21 NOTE — Progress Notes (Signed)
    PROCEDURAL EXPEDITER PROGRESS NOTE  Patient Name: Zachary Lewis  DOB:1961/05/10 Date of Admission: 04/20/2024  Date of Assessment:04/21/24   -------------------------------------------------------------------------------------------------------------------   Brief clinical summary: 63 yr old male with Hx of type 2 D, HTN, Periipheral artery disease on plavix , obesity, NASH cirrhoeie with ascites,   He is having surgery 04/21/2024, for repair of left quadriceps tendon.    Orders in place:  Yes   Communication with surgical team if no orders: n/a  Labs, test, and orders reviewed: yes  Requires surgical clearance: n/a No  What type of clearance: n/a  Clearance received: n/a  Barriers noted:none   Intervention provided by [facility] team: n/a  Barrier resolved:  not applicable   -------------------------------------------------------------------------------------------------------------------  [employer], Zachary Lewis Please contact us  directly via secure chat (search for [facility]) or by calling us  at [phone] [facility]).

## 2024-04-21 NOTE — H&P (View-Only) (Signed)
 Orthopedic Consult  Patient ID: Zachary Lewis MRN: [id number] DOB/AGE: 09/20/1960 63 y.o.  Reason for Consult: Left knee and right shoulder pain Referring Physician: Barbarann  HPI: Zachary Lewis is an 63 y.o. male who fell yesterday.  He was seen in the emergency room at [facility].  He was found to have a right shoulder dislocation which was closed reduced in the emergency room.  He also had left knee pain.  Prior to this approximate week and a half ago he had injured his right knee.  This is more of a bruise and has been sore and is able to ambulate.  His more recent pain started after a fall yesterday involve the left knee and right shoulder.  He denies any antecedent pain to these areas.  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) 08/04/2018   Diabetes mellitus    Type II   Hyperlipidemia    Hypertension    NSAID-induced gastric ulcer 12/13/2018   EGD 11/2018 H pylori neg Omeprazole  started   Obesity    Peripheral arterial disease    Personal history of colonic adenomas 03/09/2013   Tinea pedis    Chronic    Past Surgical History:  Procedure Laterality Date   ABDOMINAL AORTOGRAM W/LOWER EXTREMITY N/A 04/15/2023   Procedure: ABDOMINAL AORTOGRAM W/LOWER EXTREMITY;  Surgeon: Pearline Norman RAMAN, MD;  Location: [facility] INVASIVE [facility];  Service: Cardiovascular;  Laterality: N/A;   BYPASS GRAFT FEMORAL-PERONEAL Left 04/18/2023   Procedure: LEFT FEMORAL-PERONEAL ARTERY BYPASS;  Surgeon: Sheree Penne Bruckner, MD;  Location: [facility] OR;  Service: Vascular;  Laterality: Left;   COLONOSCOPY     COLONOSCOPY W/ POLYPECTOMY  2014   IR PARACENTESIS  07/02/2018   IR TRANSCATHETER BX  07/17/2018   IR VENOGRAM HEPATIC WO HEMODYNAMIC EVALUATION  07/17/2018   myringectomy  06/2005   POLYPECTOMY     UMBILICAL HERNIA REPAIR  01/1997   VASECTOMY  07/2002   VEIN HARVEST Left 04/18/2023    Procedure: VEIN HARVEST USING GREATER SAPHENOUS VEIN;  Surgeon: Sheree Penne Bruckner, MD;  Location[facility];  Service: Vascular;  Laterality: Left;    Family History  Problem Relation Age of Onset   Hypertension Other    Diabetes Neg Hx    Heart disease Neg Hx    Colon cancer Neg Hx    Stomach cancer Neg Hx    Pancreatic disease Neg Hx    Colon polyps Neg Hx    Esophageal cancer Neg Hx    Rectal cancer Neg Hx     Social History:  reports that he quit smoking about 12 months ago. His smoking use included cigarettes. He has never used smokeless tobacco. He reports current alcohol  use. He reports that he does not use drugs.  Allergies: No Known Allergies  Medications: I have reviewed the patient's current medications.  ROS: Constitutional: No fever or chills Vision: No changes in vision ENT: No difficulty swallowing CV: No chest pain Pulm: No SOB or wheezing GI: No nausea or vomiting GU: No urgency or inability to hold urine Skin: No poor wound healing Neurologic: No numbness or tingling Psychiatric: No depression or anxiety Heme: No bruising Allergic: No reaction to medications or food   Exam: Blood pressure (!) 142/84, pulse 83, temperature (!) 97.5 F (36.4 C), temperature source Oral, resp. rate 17, height 5' 7 (1.702 m), weight  95.7 kg, SpO2 100%. General: Well-appearing gentleman no acute distress Orientation: Alert and oriented Mood and Affect: Mood is calm   Injured Extremity (CV, lymph, sensation, reflexes): Examination of the right shoulder reveals a located shoulder.  Tender about the right shoulder.  Arm is held in a sling.  He has intact sensation in the axillary, musculotendinous, radial, ulnar and median nerve distributions.  Intact motor function in the AIN and ulnar distributions.  Weakness with abduction of the thumb which is per baseline but otherwise the PIN nerve is intact  Examination of the left knee reveals a palpable defect above the patella  on the left side.  He is able to actively extend his knee.  Mild bruising.  Intact station in the saphenous, sural, tibial, and peroneal nerve distributions 5 out of 5 strength EHL, FHL, gastrocs  Examination of the right knee reveals bruising.  He has no instability varus valgus stress.  Negative anterior posterior stress.  Normal range of motion.  No tense of his about the left upper extremity no defect or deformity     Medical Decision Making: Data: Imaging: X-rays of the right shoulder reveal a right shoulder dislocation.  Subsequent x-rays show successful relocation.  Likely Hill-Sachs lesion  X-rays of the left knee reveal a low riding patella but no other fractures abnormalities  Labs: White cell count of 11.8, hemoglobin 16.7, Mehta crit 48.1, glucose 377  Imaging or Labs ordered: MRI left knee and right shoulder  Medical history and chart was reviewed and case discussed with medical provider.  Assessment/Plan: The patient has 2 major injuries.  The first is the left leg which has a quadriceps tendon rupture.  Will get an MRI to confirm this but is evident on clinical examination.  This require surgical correction.  This will be an open reduction and open repair of the left quadriceps tendon.  We discussed the risks, benefits, alternatives to surgery.  The risks include but are not limited to bleeding, infection, injury to the tendon, extensor lag, patellar arthritis, and the nutritional surgeries.  We also discussed the postoperative dilatation course and the potential for stiffness.  His second concern is the right shoulder dislocation.  This appears to be well relocated.  There is only concern for the Hill-Sachs lesion but also possibly of a rotator cuff type injury.  Will go ahead and get an MRI scan of this as well to further assess it.  For now we will keep him in the sling with no range of motion and nonweightbearing of the right arm.  In the long-term, further treatment based  on the findings of the MRI scan.  He does have some right knee discomfort.  This is likely bruising.  This is not an acute issue but will certainly compound his ability to ambulate and mobilize after surgery.  At this point we will plan for surgery today for his left quadricep tendon stair  New problem w/ workup planned: High complexity diagnosis (Level 5) Surgery w/ risks or Emergency surgery: High complexity Risk (Level 5)  All others are Level 4 with comprehensive musculoskeletal exam.  Cordella Rhein, MD, MS [facility] / [place] [phone]

## 2024-04-21 NOTE — Assessment & Plan Note (Signed)
 BP is not at goal Resume home oral antihypertensives. Closely monitor vital signs.

## 2024-04-21 NOTE — Assessment & Plan Note (Addendum)
 Resume home Plavix  when okay with orthopedic surgery Continue ASA for now

## 2024-04-21 NOTE — Anesthesia Postprocedure Evaluation (Signed)
 Anesthesia Post Note  Patient: Zachary Lewis  Procedure(s) Performed: REPAIR, TENDON, QUADRICEPS (Left)     Patient location during evaluation: PACU Anesthesia Type: General Level of consciousness: sedated and patient cooperative Pain management: pain level controlled Vital Signs Assessment: post-procedure vital signs reviewed and stable Respiratory status: spontaneous breathing Cardiovascular status: stable Anesthetic complications: no   No notable events documented.  Last Vitals:  Vitals:   04/21/24 2315 04/21/24 2338  BP: (!) 154/85 (!) 158/87  Pulse: 86 76  Resp: 16 18  Temp: 36.6 C 36.5 C  SpO2: 95% 100%    Last Pain:  Vitals:   04/21/24 2300  TempSrc:   PainSc: Asleep                 Norleen Pope

## 2024-04-21 NOTE — Assessment & Plan Note (Signed)
 Strict ETOH cessation is encouraged No current concerns in this regard Check LFTs in AM

## 2024-04-21 NOTE — Anesthesia Procedure Notes (Signed)
 Anesthesia Procedure Image Left forearm US  guided PIV

## 2024-04-21 NOTE — Progress Notes (Signed)
 Progress Note   Patient: Zachary Lewis FMW:[id number] DOB: [birth year] DOA: 04/20/2024     1 DOS: the patient was seen and examined on 04/21/2024   Brief hospital course: 63yo with h/o T2DM, HTN, HLD, PAD, NASH cirrhosis with ascites, and class 1 obesity who presented on 12/2 with a fall.  R shoulder xray revealed anterior and inferior dislocation of humeral head with likely Hill-Sachs impact fracture; this was reduced in the ER and orthopedics is ordering an MRI for further evaluation.  Also with L quadriceps tendon rupture, planned for repair on 12/3.   Assessment & Plan Rupture of quadriceps tendon, left, initial encounter Mechanical fall x 2 MRI with ruptured quadriceps tendon at distal patellar attachment Pain control as needed Plan for surgical repair on 04/21/2024 Fall precautions Shoulder dislocation, right, initial encounter Also with R shoulder injury MRI with bony Bankart injury with labral defect and large Hill-Sachs impaction of the posterolateral humeral head as well as full-thickness tears of supraspinatus and infraspinatus tendons This also appears to need surgical intervention, but will defer to orthopedics Of note, he previously has a LEFT shoulder MRI (10/23025), also with full-thickness tears of the supraspinatus and infraspinatus tendons; this was reviewed by Dr. Dozier on 12/1 and is planned for conservative treatment with PT and steroid injection (subacromial injection given) for now and need for shoulder replacement in the future if unsuccessful Right knee pain In addition to the traumatic injuries as above, he reports R knee pain occurring from a prior fall He is in a knee brace and is requesting an MRI for this issue R knee MRI was ordered on 12/1 by PA Glendia Long (appears to work for [employer], an Urgent Care with records not currently available) Will order R knee MRI for tomorrow Uncontrolled type 2 diabetes mellitus with hyperglycemia, with long-term  current use of insulin  (HCC) Last A1c was 8.6, poor control Hold semaglutide  Continue glargine at altered dose (currently 15 units BID rather than 40 units daily) Cover with moderate-scale SSI Carb modified diet  PVD (peripheral vascular disease) Resume home Plavix  when okay with orthopedic surgery Continue ASA for now HYPERTENSION, BENIGN ESSENTIAL BP is not at goal Resume home oral antihypertensives. Closely monitor vital signs. Hyperlipidemia associated with type 2 diabetes mellitus (HCC) No longer taking Zetia  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) Strict ETOH cessation is encouraged No current concerns in this regard Check LFTs in AM Obesity (BMI 30-39.9) Body mass index is 33.05 kg/m.[name]  Weight loss should be encouraged on an ongoing basis Resume semaglutide  after discharge Outpatient PCP/bariatric medicine f/u encouraged Significantly low or high BMI is associated with higher medical risk including morbidity and mortality       Consultants: Orthopedics PT OT  Procedures: Quadriceps tendon repair 12/3  Antibiotics: None  30 Day Unplanned Readmission Risk Score    Flowsheet Row ED to Hosp-Admission (Current) from 04/20/2024 in [facility]  30 Day Unplanned Readmission Risk Score (%) 10.31 Filed at 04/21/2024 0401    This score is the patient's risk of an unplanned readmission within 30 days of being discharged (0 -100%). The score is based on dignosis, age, lab data, medications, orders, and past utilization.   Low:  0-14.9   Medium: 15-21.9   High: 22-29.9   Extreme: 30 and above           Subjective: Pain is better controlled, no specific issues currently.  He is hungry  and eager to have surgery so that he can eat.   Objective: Vitals:   04/21/24 0929 04/21/24 1225  BP: 132/79 (!) 140/106  Pulse: 87 83  Resp: 17 16  Temp: 97.7 F (36.5 C) 98.6 F (37 C)  SpO2: 98% 100%    Intake/Output  Summary (Last 24 hours) at 04/21/2024 1716 Last data filed at 04/21/2024 1616 Gross per 24 hour  Intake 307.5 ml  Output 900 ml  Net -592.5 ml   Filed Weights   04/20/24 1751  Weight: 95.7 kg    Exam:  General:  Appears calm and comfortable and is in NAD Eyes:  normal lids, iris ENT:  grossly normal hearing, lips & tongue, mmm Cardiovascular:  RRR. No LE edema.  Respiratory:   CTA bilaterally with no wheezes/rales/rhonchi.  Normal respiratory effort. Abdomen:  soft, NT, ND Skin:  no rash or induration seen on limited exam Musculoskeletal:  R shoulder sling in place; R knee brace in place; L knee immobilizer in place (although appears to be poorly positioned at this time) Psychiatric:  grossly normal mood and affect, speech fluent and appropriate, AOx3 Neurologic:  CN 2-12 grossly intact  Data Reviewed: I have reviewed the patient's lab results since admission.  Pertinent labs for today include:   Unremarkable BMP WBC 11.8 MRSA PCR negative    Family Communication: None present  Mobility: PT/OT Consulted     Code Status: Full Code   Disposition: Status is: Inpatient Remains inpatient appropriate because: ongoing management     Time spent: 50 minutes  Unresulted Labs (From admission, onward)     Start     Ordered   04/22/24 0500  CBC  Tomorrow morning,   R        04/21/24 0726   04/22/24 0500  Comprehensive metabolic panel with GFR  Tomorrow morning,   R        04/21/24 1714             Author: Delon Herald, MD 04/21/2024 5:16 PM  For on call review [email or web address].

## 2024-04-21 NOTE — Hospital Course (Addendum)
 63yo with h/o T2DM, HTN, HLD, PAD, NASH cirrhosis with ascites, and class 1 obesity who presented on 12/2 with a fall.  R shoulder xray revealed anterior and inferior dislocation of humeral head with likely Hill-Sachs impact fracture; this was reduced in the ER and orthopedics is ordering an MRI for further evaluation.  Also with L quadriceps tendon rupture, underwent repair on 12/3.

## 2024-04-21 NOTE — Assessment & Plan Note (Signed)
 Also with R shoulder injury MRI with bony Bankart injury with labral defect and large Hill-Sachs impaction of the posterolateral humeral head as well as full-thickness tears of supraspinatus and infraspinatus tendons This also appears to need surgical intervention, but will defer to orthopedics Of note, he previously has a LEFT shoulder MRI (10/23025), also with full-thickness tears of the supraspinatus and infraspinatus tendons; this was reviewed by Dr. Dozier on 12/1 and is planned for conservative treatment with PT and steroid injection (subacromial injection given) for now and need for shoulder replacement in the future if unsuccessful

## 2024-04-21 NOTE — Op Note (Signed)
 DATE OF SURGERY:  04/21/2024  TIME: 8:32 PM  PATIENT NAME:  Zachary Lewis  AGE: 63 y.o.  PRE-OPERATIVE DIAGNOSIS:  RUPTURED LEFT QUAD TENDON  POST-OPERATIVE DIAGNOSIS:  SAME  PROCEDURE:  REPAIR, TENDON, QUADRICEPS  SURGEON:  Cordella SHAUNNA Rhein  ASSISTANT:  None  OPERATIVE IMPLANTS:  * No implants in log *  UNIQUE ASPECTS OF THE CASE: None  ESTIMATED BLOOD LOSS: 100  PREOPERATIVE INDICATIONS:  [name] is a 63 y.o. year old who fell and suffered an RUPTURED LEFT QUAD TENDON. He was brought into the ER and then admitted and optimized and then elected for surgical intervention.    The risks benefits and alternatives were discussed with the patient including but not limited to the risks of nonoperative treatment, versus surgical intervention including infection, bleeding, nerve injury, malunion, nonunion, hardware prominence, hardware failure, need for hardware removal, blood clots, cardiopulmonary complications, morbidity, mortality, among others, and they were willing to proceed.    OPERATIVE PROCEDURE:  The patient was brought to the operating room and placed in the supine position.  The patient underwent general endotracheal ovation complication.  Left leg is prepped and draped usual sterile fashion.  Antibiotics administered.  A tap suture was performed.  Midline incision made over the quadriceps tendon extending over the patella.  Initial incision with a 10 blade.  Electrocautery is used dissect down.  We exposed the superficial fascia above the the patella as well as the quadricep tendon.  We then expose laterally undermining the subcutaneous tissues to further expose the patella and retinaculum as well as quadricep tendon.  Once exposed, we cleaned any frayed edges of the quadriceps tendon with a 10 blade.  We then used a #5 FiberWire.  2 stitches were passed into the quadriceps tendon using a Krakw technique.  We then drilled 3 tunnels through the patella.  A suture  passer was then advanced the FiberWire was pulled through the patella.  Was then sutured onto the distal aspect of the patella.  This showed good hold of the tendon.  We then repaired the retinaculum with a #1 Vicryl.  The deep fascial layers were proximal Vicryl.  2-0 Vicryl dermal layers feet.  This is followed by staples for the skin.  Was then washed and dressed with Xeroform 4 x 4's ABD pad soft roll and Ace wrap.  The patient's old incision transferred to the console condition  Post op recs: WB: WBAT LLE in in knee immobilzer Abx: ancef  x23 hours post op Dressing: keep intact until follow up, change PRN if soiled or saturated. DVT prophylaxis: Plavix  Follow up: 2 weeks after surgery for a wound check with Dr. Rhein at [facility].  Address: [address], [place], [place] [address]  Office Phone: [phone]  Cordella Rhein, MD, MS [facility] Specialist / [place] [phone]

## 2024-04-22 ENCOUNTER — Encounter (HOSPITAL_COMMUNITY): Payer: Self-pay

## 2024-04-22 ENCOUNTER — Other Ambulatory Visit (HOSPITAL_COMMUNITY): Payer: Self-pay

## 2024-04-22 ENCOUNTER — Inpatient Hospital Stay (HOSPITAL_COMMUNITY)

## 2024-04-22 DIAGNOSIS — S76112A Strain of left quadriceps muscle, fascia and tendon, initial encounter: Secondary | ICD-10-CM | POA: Diagnosis not present

## 2024-04-22 LAB — GLUCOSE, CAPILLARY
Glucose-Capillary: 279 mg/dL — ABNORMAL HIGH (ref 70–99)
Glucose-Capillary: 341 mg/dL — ABNORMAL HIGH (ref 70–99)
Glucose-Capillary: 152 mg/dL — ABNORMAL HIGH (ref 70–99)
Glucose-Capillary: 213 mg/dL — ABNORMAL HIGH (ref 70–99)

## 2024-04-22 LAB — COMPREHENSIVE METABOLIC PANEL WITH GFR
ALT: 42 U/L (ref 0–44)
AST: 43 U/L — ABNORMAL HIGH (ref 15–41)
Albumin: 4 g/dL (ref 3.5–5.0)
Alkaline Phosphatase: 123 U/L (ref 38–126)
Anion gap: 12 (ref 5–15)
BUN: 20 mg/dL (ref 8–23)
CO2: 21 mmol/L — ABNORMAL LOW (ref 22–32)
Calcium: 9.4 mg/dL (ref 8.9–10.3)
Chloride: 98 mmol/L (ref 98–111)
Creatinine, Ser: 0.84 mg/dL (ref 0.61–1.24)
GFR, Estimated: >60 mL/min (ref >60)
Glucose, Bld: 396 mg/dL — ABNORMAL HIGH (ref 70–99)
Potassium: 4.9 mmol/L (ref 3.5–5.1)
Sodium: 131 mmol/L — ABNORMAL LOW (ref 135–145)
Total Bilirubin: 0.4 mg/dL (ref 0.0–1.2)
Total Protein: 7.1 g/dL (ref 6.5–8.1)

## 2024-04-22 LAB — CBC
HCT: 40.4 % (ref 39.0–52.0)
Hemoglobin: 13.3 g/dL (ref 13.0–17.0)
MCH: 34.3 pg — ABNORMAL HIGH (ref 26.0–34.0)
MCHC: 32.9 g/dL (ref 30.0–36.0)
MCV: 104.1 fL — ABNORMAL HIGH (ref 80.0–100.0)
Platelets: 270 K/uL (ref 150–400)
RBC: 3.88 MIL/uL — ABNORMAL LOW (ref 4.22–5.81)
RDW: 14.1 % (ref 11.5–15.5)
WBC: 8.4 K/uL (ref 4.0–10.5)
nRBC: 0 % (ref 0.0–0.2)

## 2024-04-22 MED ORDER — OXYCODONE HCL 5 MG PO TABS
5.0000 mg | ORAL_TABLET | ORAL | Status: DC | PRN
  Administered 2024-04-22 – 2024-04-25 (×8): 10 mg via ORAL
  Filled 2024-04-22 (×8): qty 2

## 2024-04-22 MED ORDER — MORPHINE SULFATE (PF) 2 MG/ML IV SOLN
2.0000 mg | INTRAVENOUS | Status: DC | PRN
  Administered 2024-04-22 – 2024-04-23 (×3): 2 mg via INTRAVENOUS
  Filled 2024-04-22 (×3): qty 1

## 2024-04-22 MED ORDER — CARMEX CLASSIC LIP BALM EX OINT
TOPICAL_OINTMENT | CUTANEOUS | Status: DC | PRN
  Filled 2024-04-22: qty 10

## 2024-04-22 MED ORDER — CYCLOBENZAPRINE HCL 10 MG PO TABS
10.0000 mg | ORAL_TABLET | Freq: Three times a day (TID) | ORAL | Status: DC | PRN
  Administered 2024-04-22 – 2024-04-30 (×14): 10 mg via ORAL
  Filled 2024-04-22 (×14): qty 1

## 2024-04-22 NOTE — Progress Notes (Signed)
 Progress Note   Patient: Zachary Lewis FMW:[id number] DOB: 09/05/1960 DOA: 04/20/2024     2 DOS: the patient was seen and examined on 04/22/2024   Brief hospital course: 63yo with h/o T2DM, HTN, HLD, PAD, NASH cirrhosis with ascites, and class 1 obesity who presented on 12/2 with a fall.  R shoulder xray revealed anterior and inferior dislocation of humeral head with likely Hill-Sachs impact fracture; this was reduced in the ER and orthopedics is ordering an MRI for further evaluation.  Also with L quadriceps tendon rupture, underwent repair on 12/3.   Assessment & Plan Rupture of quadriceps tendon, left, initial encounter Mechanical fall x 2 MRI with ruptured quadriceps tendon at distal patellar attachment Pain control as needed Underwent surgical repair on 04/21/2024 Fall precautions Given diffuse orthopedic injuries, he appears likely to benefit from CIR  Shoulder dislocation, right, initial encounter Also with R shoulder injury MRI with bony Bankart injury with labral defect and large Hill-Sachs impaction of the posterolateral humeral head as well as full-thickness tears of supraspinatus and infraspinatus tendons This also appears to need surgical intervention, but will defer to orthopedics Of note, he previously has a LEFT shoulder MRI (10/23025), also with full-thickness tears of the supraspinatus and infraspinatus tendons; this was reviewed by Dr. Dozier on 12/1 and is planned for conservative treatment with PT and steroid injection (subacromial injection given) for now and need for shoulder replacement in the future if unsuccessful Right knee pain In addition to the traumatic injuries as above, he reports R knee pain occurring from a prior fall He is in a knee brace and is requesting an MRI for this issue R knee MRI was ordered on 12/1 by PA Glendia Long (appears to work for [employer], an Urgent Care with records not currently available) R knee MRI also shows quadriceps  tendon rupture, although this one is not complete Will defer to orthopedics Uncontrolled type 2 diabetes mellitus with hyperglycemia, with long-term current use of insulin  (HCC) Last A1c was 8.6, poor control Hold semaglutide  Continue glargine at altered dose (currently 15 units BID rather than 40 units daily) Cover with moderate-scale SSI Carb modified diet  PVD (peripheral vascular disease) Resume home Plavix  when okay with orthopedic surgery Continue ASA for now HYPERTENSION, BENIGN ESSENTIAL BP is not at goal Resume home oral antihypertensives. Closely monitor vital signs. Hyperlipidemia associated with type 2 diabetes mellitus (HCC) No longer taking Zetia  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) Strict ETOH cessation is encouraged No current concerns in this regard Check LFTs in AM Obesity (BMI 30-39.9) Body mass index is 33.05 kg/m.[name]  Weight loss should be encouraged on an ongoing basis Resume semaglutide  after discharge Outpatient PCP/bariatric medicine f/u encouraged Significantly low or high BMI is associated with higher medical risk including morbidity and mortality       Consultants: Orthopedics PT OT   Procedures: Quadriceps tendon repair 12/3   Antibiotics: None    30 Day Unplanned Readmission Risk Score    Flowsheet Row ED to Hosp-Admission (Current) from 04/20/2024 in [facility]  30 Day Unplanned Readmission Risk Score (%) 11.32 Filed at 04/22/2024 0401    This score is the patient's risk of an unplanned readmission within 30 days of being discharged (0 -100%). The score is based on dignosis, age, lab data, medications, orders, and past utilization.   Low:  0-14.9   Medium: 15-21.9   High: 22-29.9   Extreme: 30  and above           Subjective: Significant L knee pain s/p surgery, but also with R shoulder and R knee pain.     Objective: Vitals:   04/22/24 0406 04/22/24 1214  BP: (!)  144/89 (!) 144/79  Pulse: 92 97  Resp: 18 18  Temp: 97.6 F (36.4 C) 98.7 F (37.1 C)  SpO2: 97% 99%    Intake/Output Summary (Last 24 hours) at 04/22/2024 1644 Last data filed at 04/22/2024 0848 Gross per 24 hour  Intake 700 ml  Output 1575 ml  Net -875 ml   Filed Weights   04/20/24 1751  Weight: 95.7 kg    Exam:  General:  Appears calm and comfortable and is in NAD Eyes:  normal lids, iris ENT:  grossly normal hearing, lips & tongue, mmm Cardiovascular:  RRR. No LE edema.  Respiratory:   CTA bilaterally with no wheezes/rales/rhonchi.  Normal respiratory effort. Abdomen:  soft, NT, ND Skin:  no rash or induration seen on limited exam Musculoskeletal:  L knee immobilizer in place; R knee brace in place; R shoulder sling in place Psychiatric:  grossly normal mood and affect, speech fluent and appropriate, AOx3 Neurologic:  CN 2-12 grossly intact, moves all extremities in coordinated fashion  Data Reviewed: I have reviewed the patient's lab results since admission.  Pertinent labs for today include:   Na++ 131, not clinically significant Glucose 396 Stable CBC    Family Communication: None present     Code Status: Full Code   Disposition: Status is: Inpatient Remains inpatient appropriate because: ongoing management     Time spent: 50 minutes  Unresulted Labs (From admission, onward)    None        Author: Delon Herald, MD 04/22/2024 4:44 PM  For on call review [email or web address].

## 2024-04-22 NOTE — Assessment & Plan Note (Signed)
 No longer taking Zetia 

## 2024-04-22 NOTE — Assessment & Plan Note (Signed)
 Strict ETOH cessation is encouraged No current concerns in this regard Check LFTs in AM

## 2024-04-22 NOTE — Assessment & Plan Note (Signed)
 Last A1c was 8.6, poor control Hold semaglutide  Continue glargine at altered dose (currently 15 units BID rather than 40 units daily) Cover with moderate-scale SSI Carb modified diet

## 2024-04-22 NOTE — Plan of Care (Signed)

## 2024-04-22 NOTE — Progress Notes (Signed)
 I attempted to see the patient this morning however he is currently on the MRI scanner.  His vital signs are stable.  His labs are unremarkable.  I will attempt to return to see him later on today or see if our partners can see him.  However from, from orthopedic standpoint, he can mobilize with physical therapy.  He may weight-bear as tolerated on the left leg with a knee immobilizer.  The right shoulder should be kept in a sling with no range of motion and no weightbearing.  His right leg is current being evaluated in the MRI scanner.  I will determine further recommendations based on these results.

## 2024-04-22 NOTE — TOC Initial Note (Signed)
 Transition of Care [facility]) - Initial/Assessment Note    Patient Details  Name: Zachary Lewis MRN: [id number] Date of Birth: [birth year]  [facility] CM/SW Contact:    Sonda Manuella Quill, RN Phone Number: 04/22/2024, 6:51 PM  Clinical Narrative:                 [place] w/ pt and spouse Haig Gerardo [phone]) in room; pt lives at home; he plans to return w/ support from his spouse at d/c; insurance/PCP verified; Mrs Nealy explained this is a hospitalization is billed under workmen's comp; they denied pt experiencing SDOH risks; pt has walker; he does not have HH services or home oxygen; awaiting PT eval; IP CM following.  Expected Discharge Plan: Home/Self Care Barriers to Discharge: Continued Medical Work up   Patient Goals and CMS Choice Patient states their goals for this hospitalization and ongoing recovery are:: home          Expected Discharge Plan and Services   Discharge Planning Services: CM Consult   Living arrangements for the past 2 months: Single Family Home                 DME Arranged: N/A DME Agency: NA       HH Arranged: NA HH Agency: NA        Prior Living Arrangements/Services Living arrangements for the past 2 months: Single Family Home Lives with:: Spouse Patient language and need for interpreter reviewed:: Yes Do you feel safe going back to the place where you live?: Yes      Need for Family Participation in Patient Care: Yes (Comment) Care giver support system in place?: Yes (comment) Current home services: DME (walker) Criminal Activity/Legal Involvement Pertinent to Current Situation/Hospitalization: No - Comment as needed  Activities of Daily Living   ADL Screening (condition at time of admission) Independently performs ADLs?: No Does the patient have a NEW difficulty with bathing/dressing/toileting/self-feeding that is expected to last >3 days?: Yes (Initiates electronic notice to provider for possible OT  consult) Does the patient have a NEW difficulty with getting in/out of bed, walking, or climbing stairs that is expected to last >3 days?: Yes (Initiates electronic notice to provider for possible PT consult) Does the patient have a NEW difficulty with communication that is expected to last >3 days?: No Is the patient deaf or have difficulty hearing?: No Does the patient have difficulty seeing, even when wearing glasses/contacts?: No Does the patient have difficulty concentrating, remembering, or making decisions?: No  Permission Sought/Granted Permission sought to share information with : Case Manager Permission granted to share information with : Yes, Verbal Permission Granted  Share Information with NAME: Case Manager     Permission granted to share info w Relationship: Sanuel Ladnier (spouse) [phone]     Emotional Assessment Appearance:: Appears stated age Attitude/Demeanor/Rapport: Gracious Affect (typically observed): Accepting Orientation: : Oriented to Self, Oriented to Place, Oriented to  Time, Oriented to Situation Alcohol  / Substance Use: Not Applicable Psych Involvement: No (comment)  Admission diagnosis:  Dislocation of left shoulder joint, initial encounter [S43.005A] Quadriceps tendon rupture, left, initial encounter [S76.112A] Rupture of quadriceps tendon, left, initial encounter [D23.887J] Patient Active Problem List   Diagnosis Date Noted   Shoulder dislocation, right, initial encounter 04/21/2024   Right knee pain 04/21/2024   Rupture of quadriceps tendon, left, initial encounter 04/20/2024   Low T4 06/02/2023   Cognitive change 05/12/2023   Hearing loss 05/12/2023   Limb ischemia 04/18/2023  Diabetic foot infection (HCC) 04/12/2023   Onychomycosis 04/12/2023   Uncontrolled type 2 diabetes mellitus with hyperglycemia, with long-term current use of insulin  (HCC) 04/12/2023   PVD (peripheral vascular disease) 04/12/2023   Angiodysplasia of cecum 11/25/2022    Routine general medical examination at a health care facility 02/10/2022   Elevated TSH 11/07/2020   Erectile dysfunction 01/07/2020   Portal hypertensive gastropathy (HCC) 06/24/2019   Low back pain 12/29/2018   NSAID-induced gastric ulcer 12/13/2018   Pain due to onychomycosis of toenails of both feet 12/01/2018   Pre-ulcerative calluses 12/01/2018   Porokeratosis 12/01/2018   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) 08/04/2018   Hypokalemia 06/24/2018   History of alcoholic hepatitis    Alcoholic liver disease    Former smoker 01/04/2014   Hx of adenomatous colonic polyps 03/09/2013   Prostate cancer screening 12/07/2012   Encounter for general adult medical examination with abnormal findings 08/23/2010   Hyperlipidemia    Obesity (BMI 30-39.9)    HYPERTENSION, BENIGN ESSENTIAL 01/05/2010   BACK PAIN, LUMBAR, WITH RADICULOPATHY 09/13/2008   TINEA PEDIS 07/16/2007   DM (diabetes mellitus), type 2 (HCC) 08/13/2006   Hyperlipidemia associated with type 2 diabetes mellitus (HCC) 08/13/2006   Allergy 08/13/2006   Fatty liver 08/13/2006   PCP:  Randeen Laine LABOR, MD Pharmacy:   [id number] [place], [place] - [address] AT [facility] & [address] [place] [address] Phone: [phone] Fax: [phone]  [facility] [address] [place] [address] Phone: [phone] Fax: [phone]     Social Drivers of Health (SDOH) Social History: SDOH Screenings   Food Insecurity: No Food Insecurity (04/22/2024)  Housing: Low Risk  (04/22/2024)  Transportation Needs: No Transportation Needs (04/22/2024)  Utilities: Not At Risk (04/22/2024)  Depression (PHQ2-9): Low Risk  (06/02/2023)  Recent Concern: Depression (PHQ2-9) - Medium Risk (05/12/2023)  Tobacco Use: Medium Risk (04/20/2024)   SDOH Interventions: Food Insecurity Interventions: Inpatient TOC Housing  Interventions: Intervention Not Indicated, Inpatient TOC Transportation Interventions: Intervention Not Indicated, Inpatient TOC Utilities Interventions: Intervention Not Indicated, Inpatient TOC   Readmission Risk Interventions    04/14/2023    5:27 PM  Readmission Risk Prevention Plan  Post Dischage Appt Complete  Medication Screening Complete  Transportation Screening Complete

## 2024-04-22 NOTE — Assessment & Plan Note (Signed)
 BP is not at goal Resume home oral antihypertensives. Closely monitor vital signs.

## 2024-04-22 NOTE — Assessment & Plan Note (Signed)
 Resume home Plavix  when okay with orthopedic surgery Continue ASA for now

## 2024-04-22 NOTE — Assessment & Plan Note (Addendum)
 Mechanical fall x 2 MRI with ruptured quadriceps tendon at distal patellar attachment Pain control as needed Underwent surgical repair on 04/21/2024 Fall precautions Given diffuse orthopedic injuries, he appears likely to benefit from CIR

## 2024-04-22 NOTE — Assessment & Plan Note (Addendum)
 In addition to the traumatic injuries as above, he reports R knee pain occurring from a prior fall He is in a knee brace and is requesting an MRI for this issue R knee MRI was ordered on 12/1 by PA Glendia Long (appears to work for [facility] [employer] [facility], an Urgent Care with records not currently available) R knee MRI also shows quadriceps tendon rupture, although this one is not complete Will defer to orthopedics

## 2024-04-22 NOTE — Progress Notes (Signed)
 PT Cancellation Note  Patient Details Name: Zachary Lewis MRN: [id number] DOB: [birth year]   Cancelled Treatment:    Reason Eval/Treat Not Completed: Pain limiting ability to participate (pt reported his pain is a 10/10 and that he's not able to tolerate any activity at present. He reports RN is bringing him pain medication soon. Will follow.)  [name] PT 04/22/2024  Acute Rehabilitation Services  Office [phone]

## 2024-04-22 NOTE — Evaluation (Signed)
 Occupational Therapy Evaluation Patient Details Name: Zachary Lewis MRN: [id number] DOB: Jun 11, 1960 Today's Date: 04/22/2024   History of Present Illness   Pt is a 63 y.o. male who presented due to a fall when stepping on a curb at work. Pt found to have a R anterior and inferior dislocation of the humeral head with likely Hill-Sachs impaction fracture. Pt also found to have quadricep tendon rupture.  Pt s/p 04/21/24 L quadricep tedon repair. PMH: DM2, HTN, HLD, PAD, obesity, NASH cirrhosis with ascites, prior injury to right lower extremity.     Clinical Impressions Pt at the start of session did not want to participate but after education about the purpose of therapy agreed. Pt at this time required total assist to adjust sling in the proper position and then with max cues on how to sequence with min-mod assist was able to go from supine to sitting. He then completed urination while sitting at EOB with set up. He decline OOB but agreed to lateral slide to [facility] with CGA to min assist. Pt then required min-mod assist from sitting to supine. Patient will benefit from continued inpatient follow up therapy, <3 hours/day.     If plan is discharge home, recommend the following:   A lot of help with walking and/or transfers;A lot of help with bathing/dressing/bathroom;Assistance with cooking/housework;Assist for transportation;Help with stairs or ramp for entrance     Functional Status Assessment   Patient has had a recent decline in their functional status and demonstrates the ability to make significant improvements in function in a reasonable and predictable amount of time.     Equipment Recommendations    (TBD)     Recommendations for Other Services         Precautions/Restrictions   Precautions Precautions: Fall Recall of Precautions/Restrictions: Impaired Precaution/Restrictions Comments: Ortho note: LLE WBAT with KI and RUE in sling no ROM and NWB. Noted pt attempting to  use RUE and needed consistent cues even in sling. Edu about sliding thumb into sling with mobility to decrease in attempt to reach out to grab items Required Braces or Orthoses: Sling;Knee Immobilizer - Left Restrictions Weight Bearing Restrictions Per Provider Order: Yes RUE Weight Bearing Per Provider Order: Non weight bearing     Mobility Bed Mobility Overal bed mobility: Needs Assistance Bed Mobility: Supine to Sit, Sit to Supine     Supine to sit: Min assist, Mod assist, Used rails, HOB elevated Sit to supine: Mod assist, Max assist, HOB elevated, Used rails   General bed mobility comments: exit the bed on the L side  and use of rail. Pt needs cues so they do not push with RUE when sling was fully donned    Transfers                   General transfer comment: NT-pt decline      Balance Overall balance assessment: Needs assistance Sitting-balance support: Feet supported Sitting balance-Leahy Scale: Fair Sitting balance - Comments: intially needed min assist but then was able to balance self and complete urination with urinal while at EOB                                   ADL either performed or assessed with clinical judgement   ADL Overall ADL's : Needs assistance/impaired Eating/Feeding: Minimal assistance;Sitting Eating/Feeding Details (indicate cue type and reason): set up opening items and placement Grooming: Wash/dry face;Set  up;Sitting   Upper Body Bathing: Maximal assistance;Sitting   Lower Body Bathing: Total assistance;Sitting/lateral leans   Upper Body Dressing : Maximal assistance   Lower Body Dressing: Total assistance;Sitting/lateral leans       Toileting- Clothing Manipulation and Hygiene: Total assistance;Maximal assistance;Sitting/lateral lean               Vision Baseline Vision/History: 1 Wears glasses Ability to See in Adequate Light: 0 Adequate Patient Visual Report: No change from baseline Vision  Assessment?: Wears glasses for reading     Perception         Praxis         Pertinent Vitals/Pain Pain Assessment Pain Assessment: 0-10 Pain Score: 10-Worst pain ever Pain Location: L knee Pain Descriptors / Indicators: Discomfort, Grimacing, Guarding Pain Intervention(s): Monitored during session, Limited activity within patient's tolerance, Premedicated before session, Repositioned, Ice applied     Extremity/Trunk Assessment Upper Extremity Assessment Upper Extremity Assessment: RUE deficits/detail;LUE deficits/detail RUE Deficits / Details: NWB and No ROM per ortho RUE: Unable to fully assess due to immobilization RUE Sensation: WNL RUE Coordination: decreased fine motor;decreased gross motor LUE Deficits / Details: Pt reported chronic pain in LUE and fluates how much AROM but WFL in session. Pt reported tey were getting injections but was not working LUE Sensation: WNL LUE Coordination: decreased gross motor   Lower Extremity Assessment Lower Extremity Assessment: Defer to PT evaluation       Communication Communication Communication: No apparent difficulties   Cognition Arousal: Alert Behavior During Therapy: WFL for tasks assessed/performed Cognition: No apparent impairments                               Following commands: Intact       Cueing  General Comments   Cueing Techniques: Verbal cues      Exercises     Shoulder Instructions      Home Living Family/patient expects to be discharged to:: Private residence Living Arrangements: Spouse/significant other Available Help at Discharge: Family Type of Home: House Home Access: Stairs to enter Secretary/administrator of Steps: 4 Entrance Stairs-Rails: Right;Can reach both;Left Home Layout: Multi-level;Able to live on main level with bedroom/bathroom     Bathroom Shower/Tub: Tub/shower unit;Curtain   Firefighter: Standard     Home Equipment: Rollator (4 wheels)           Prior Functioning/Environment Prior Level of Function : Independent/Modified Independent             Mobility Comments: was ambulating with walker sometime ADLs Comments: mod I due to pain in L shoulder and R knee pain with fall    OT Problem List: Decreased strength;Decreased activity tolerance;Impaired balance (sitting and/or standing);Decreased knowledge of use of DME or AE;Decreased safety awareness;Pain;Impaired UE functional use   OT Treatment/Interventions: Self-care/ADL training;Therapeutic exercise;DME and/or AE instruction;Therapeutic activities;Patient/family education;Balance training      OT Goals(Current goals can be found in the care plan section)   Acute Rehab OT Goals Patient Stated Goal: to get better OT Goal Formulation: With patient Time For Goal Achievement: 05/06/24 Potential to Achieve Goals: Good   OT Frequency:  Min 2X/week    Co-evaluation              AM-PAC OT 6 Clicks Daily Activity     Outcome Measure Help from another person eating meals?: None Help from another person taking care of personal grooming?: A Little Help from another person  toileting, which includes using toliet, bedpan, or urinal?: A Lot Help from another person bathing (including washing, rinsing, drying)?: A Lot Help from another person to put on and taking off regular upper body clothing?: A Lot Help from another person to put on and taking off regular lower body clothing?: A Lot 6 Click Score: 15   End of Session Equipment Utilized During Treatment: Gait belt Nurse Communication: Mobility status (pain meds)  Activity Tolerance: Patient tolerated treatment well Patient left: in bed;with call bell/phone within reach;with bed alarm set (left in chair position)  OT Visit Diagnosis: Unsteadiness on feet (R26.81);Other abnormalities of gait and mobility (R26.89);Repeated falls (R29.6);Muscle weakness (generalized) (M62.81);Pain Pain - Right/Left: Left Pain - part of  body: Knee                Time: 9049-8976 OT Time Calculation (min): 33 min Charges:  OT General Charges $OT Visit: 1 Visit OT Evaluation $OT Eval Low Complexity: 1 Low OT Treatments $Self Care/Home Management : 8-22 mins  Warrick POUR OTR/L  Acute Rehab Services  [phone] office number   Warrick Berber 04/22/2024, 10:38 AM

## 2024-04-22 NOTE — Assessment & Plan Note (Signed)
 Also with R shoulder injury MRI with bony Bankart injury with labral defect and large Hill-Sachs impaction of the posterolateral humeral head as well as full-thickness tears of supraspinatus and infraspinatus tendons This also appears to need surgical intervention, but will defer to orthopedics Of note, he previously has a LEFT shoulder MRI (10/23025), also with full-thickness tears of the supraspinatus and infraspinatus tendons; this was reviewed by Dr. Dozier on 12/1 and is planned for conservative treatment with PT and steroid injection (subacromial injection given) for now and need for shoulder replacement in the future if unsuccessful

## 2024-04-22 NOTE — Progress Notes (Signed)
 Orthopedic Tech Progress Note Patient Details:  Zachary Lewis Apr 02, 1961 [id number]  Ortho Devices Type of Ortho Device: Knee Immobilizer Ortho Device/Splint Location: bi lat Ortho Device/Splint Interventions: Ordered, Application, Adjustment   Post Interventions Patient Tolerated: Well Instructions Provided: Adjustment of device, Care of device  Waylan Thom Loving 04/22/2024, 1:04 PM

## 2024-04-22 NOTE — Assessment & Plan Note (Addendum)
 Body mass index is 33.05 kg/m.[name]  Weight loss should be encouraged on an ongoing basis Resume semaglutide  after discharge Outpatient PCP/bariatric medicine f/u encouraged Significantly low or high BMI is associated with higher medical risk including morbidity and mortality

## 2024-04-22 NOTE — Progress Notes (Signed)
 PT Cancellation Note  Patient Details Name: TYNER CODNER MRN: [id number] DOB: 09-18-1960   Cancelled Treatment:    Reason Eval/Treat Not Completed: Medical issues which prohibited therapy (RLE MRI this morning showed  Ruptured right distal quadriceps tendon at the attachment on the  superior pole of the patella with approximately 2.2 cm of tendon  retraction and fluid-filled gap. Awaiting orthopedic input prior to initiating PT.)   [name] PT 04/22/2024  Acute Rehabilitation Services  Office [phone]

## 2024-04-22 NOTE — Progress Notes (Signed)
 Orthopaedic Progress Note  S: The patient reports continued discomfort in his left knee.  This is more in the proximal thigh and the muscles.  Some incisional pain.  He is kept in the knee immobilizer.  Separately reports continued discomfort in the right shoulder essentially unchanged.  He is has been maintained in the sling.  Next he reports right knee symptoms.  Again he did injure this approximately 2 weeks prior.  He had previously been able to walk on a knee brace.  It does not really hurt anymore that had previously but is not sure if he reinjured it during his more recent fall  O:  Vitals:   04/22/24 0406 04/22/24 1214  BP: (!) 144/89 (!) 144/79  Pulse: 92 97  Resp: 18 18  Temp: 97.6 F (36.4 C) 98.7 F (37.1 C)  SpO2: 97% 99%    Examination of the left leg reveals a knee immobilizer in place.  There is a clean dressing.  He has intact station the saphenous, sural, tibial, and peroneal nerve distributions.  5 out of 5 strength EHL, FHL, gastrocs, 2000  Examination of the right shoulder reveals a sling in place.  Neurovascular intact  Examination of the right knee reveals diffuse tenderness.  He can keep the leg extended when held out.  There is however weakness when trying to actively stand from a flexed position.  Imaging: MRI scan of the right knee reveals a quadricep tendons tear  Labs:  Results for orders placed or performed during the hospital encounter of 04/20/24 (from the past 24 hours)  Glucose, capillary     Status: Abnormal   Collection Time: 04/21/24  7:37 PM  Result Value Ref Range   Glucose-Capillary 119 (H) 70 - 99 mg/dL  Glucose, capillary     Status: Abnormal   Collection Time: 04/21/24 10:08 PM  Result Value Ref Range   Glucose-Capillary 155 (H) 70 - 99 mg/dL  CBC     Status: Abnormal   Collection Time: 04/22/24  5:18 AM  Result Value Ref Range   WBC 8.4 4.0 - 10.5 K/uL   RBC 3.88 (L) 4.22 - 5.81 MIL/uL   Hemoglobin 13.3 13.0 - 17.0 g/dL   HCT  59.5 60.9 - 47.9 %   MCV 104.1 (H) 80.0 - 100.0 fL   MCH 34.3 (H) 26.0 - 34.0 pg   MCHC 32.9 30.0 - 36.0 g/dL   RDW 85.8 88.4 - 84.4 %   Platelets 270 150 - 400 K/uL   nRBC 0.0 0.0 - 0.2 %  Comprehensive metabolic panel with GFR     Status: Abnormal   Collection Time: 04/22/24  5:18 AM  Result Value Ref Range   Sodium 131 (L) 135 - 145 mmol/L   Potassium 4.9 3.5 - 5.1 mmol/L   Chloride 98 98 - 111 mmol/L   CO2 21 (L) 22 - 32 mmol/L   Glucose, Bld 396 (H) 70 - 99 mg/dL   BUN 20 8 - 23 mg/dL   Creatinine, Ser 9.15 0.61 - 1.24 mg/dL   Calcium 9.4 8.9 - 89.6 mg/dL   Total Protein 7.1 6.5 - 8.1 g/dL   Albumin 4.0 3.5 - 5.0 g/dL   AST 43 (H) 15 - 41 U/L   ALT 42 0 - 44 U/L   Alkaline Phosphatase 123 38 - 126 U/L   Total Bilirubin 0.4 0.0 - 1.2 mg/dL   GFR, Estimated >39 >39 mL/min   Anion gap 12 5 - 15  Glucose, capillary  Status: Abnormal   Collection Time: 04/22/24  8:19 AM  Result Value Ref Range   Glucose-Capillary 279 (H) 70 - 99 mg/dL  Glucose, capillary     Status: Abnormal   Collection Time: 04/22/24 11:23 AM  Result Value Ref Range   Glucose-Capillary 341 (H) 70 - 99 mg/dL  Glucose, capillary     Status: Abnormal   Collection Time: 04/22/24  4:29 PM  Result Value Ref Range   Glucose-Capillary 152 (H) 70 - 99 mg/dL    Assessment: Bilateral quadricep tendons tear, status post repair of left quadricep tendon, right shoulder dislocation with rotator cuff tear  With regards to the left leg, the patient is doing well after surgery.  The big concern is having his pain more in the upper part of the thigh.  This is over muscle spasms.  He recently had his pain medications increased.  Will add Flexeril  to see if this helps as well.  For the left leg, he can weight-bear as tolerated in the knee immobilizer brace.  With regards to the right knee, the MRI scan does show a tear of the quadriceps tendon as well.  He Is able to hold the leg extended once it is elevated for him.   This suggest that despite the significant tearing seen on the MRI scan, there are some fibers of the quadricep tendon versus some, the retinaculum intact the to allow for stability of the extensor mechanism.  Therefore at this point this can be treated without surgery.  We will keep him in a knee immobilizer for this as well.  He may weight-bear as tolerated in the knee immobilizer.  With regards to the right shoulder, the MRI scan did show a full-thickness tear of the supraspinatus and infraspinatus.  I discussed the case with Dr. Cristy one of our shoulder specialist.  He would typically recommend surgery for this but in a delayed fashion.  He should continue with the sling for now.  He is nonweightbearing on his right arm.  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  This is not acute.  He received a cord injection for this on Monday.  He can use the left arm as he is able   Cordella Rhein, MD, MS [facility] / [facility] [phone]

## 2024-04-23 ENCOUNTER — Other Ambulatory Visit (HOSPITAL_COMMUNITY): Payer: Self-pay

## 2024-04-23 ENCOUNTER — Other Ambulatory Visit: Payer: Self-pay

## 2024-04-23 DIAGNOSIS — S76112A Strain of left quadriceps muscle, fascia and tendon, initial encounter: Secondary | ICD-10-CM | POA: Diagnosis not present

## 2024-04-23 LAB — GLUCOSE, CAPILLARY
Glucose-Capillary: 252 mg/dL — ABNORMAL HIGH (ref 70–99)
Glucose-Capillary: 225 mg/dL — ABNORMAL HIGH (ref 70–99)
Glucose-Capillary: 298 mg/dL — ABNORMAL HIGH (ref 70–99)
Glucose-Capillary: 246 mg/dL — ABNORMAL HIGH (ref 70–99)

## 2024-04-23 MED ORDER — SPIRONOLACTONE 25 MG PO TABS
50.0000 mg | ORAL_TABLET | Freq: Every day | ORAL | Status: AC
  Administered 2024-04-23 – 2024-05-01 (×9): 50 mg via ORAL
  Filled 2024-04-23 (×9): qty 2

## 2024-04-23 MED ORDER — CLOPIDOGREL BISULFATE 75 MG PO TABS
75.0000 mg | ORAL_TABLET | Freq: Every day | ORAL | Status: DC
  Administered 2024-04-24 – 2024-05-01 (×8): 75 mg via ORAL
  Filled 2024-04-23 (×8): qty 1

## 2024-04-23 MED ORDER — INSULIN GLARGINE-YFGN 100 UNIT/ML ~~LOC~~ SOLN
20.0000 [IU] | Freq: Two times a day (BID) | SUBCUTANEOUS | Status: DC
  Administered 2024-04-23: 20 [IU] via SUBCUTANEOUS
  Filled 2024-04-23 (×2): qty 0.2

## 2024-04-23 NOTE — Assessment & Plan Note (Addendum)
 Body mass index is 33.05 kg/m.[name]  Weight loss should be encouraged on an ongoing basis Resume semaglutide  after discharge Outpatient PCP/bariatric medicine f/u encouraged Significantly low or high BMI is associated with higher medical risk including morbidity and mortality

## 2024-04-23 NOTE — Assessment & Plan Note (Addendum)
 Resume Plavix  12/6 Continue ASA

## 2024-04-23 NOTE — Progress Notes (Signed)
 Progress Note   Patient: Zachary Lewis FMW:[id number] DOB: 15-Aug-1960 DOA: 04/20/2024     3 DOS: the patient was seen and examined on 04/23/2024   Brief hospital course: 63yo with h/o T2DM, HTN, HLD, PAD, NASH cirrhosis with ascites, and class 1 obesity who presented on 12/2 with a fall.  R shoulder xray revealed anterior and inferior dislocation of humeral head with likely Hill-Sachs impact fracture; this was reduced in the ER and orthopedics is ordering an MRI for further evaluation.  Also with L quadriceps tendon rupture, underwent repair on 12/3.   Assessment & Plan Rupture of quadriceps tendon, left, initial encounter Mechanical fall x 2 MRI with ruptured R quadriceps tendon at distal patellar attachment Pain control as needed Underwent surgical repair on 04/21/2024 Fall precautions WBAT Given diffuse orthopedic injuries, he appears likely to benefit from CIR vs. STR Shoulder dislocation, right, initial encounter Also with R shoulder injury MRI with bony Bankart injury with labral defect and large Hill-Sachs impaction of the posterolateral humeral head as well as full-thickness tears of supraspinatus and infraspinatus tendons This also appears to need surgical intervention, but will defer to orthopedics as an outpatient For now, nonweightbearing with R shoulder sling and swath Of note, he previously has a LEFT shoulder MRI (10/23025), also with full-thickness tears of the supraspinatus and infraspinatus tendons; this was reviewed by Dr. Dozier on 12/1 and is planned for conservative treatment with PT and steroid injection (subacromial injection given) for now and need for shoulder replacement in the future if unsuccessful Right knee pain In addition to the traumatic injuries as above, he reports R knee pain occurring from prior fall R knee MRI also shows quadriceps tendon rupture, although this one is not complete Orthopedics recommends WBAT with knee immobilizer and PT,  conservative treatment at this time Uncontrolled type 2 diabetes mellitus with hyperglycemia, with long-term current use of insulin  (HCC) Last A1c was 8.6, poor control Hold semaglutide  Continue glargine at 20 units BID (rather than PTA 40 units daily) Cover with moderate-scale SSI Carb modified diet  PVD (peripheral vascular disease) Resume Plavix  12/6 Continue ASA  HYPERTENSION, BENIGN ESSENTIAL BP 140/84 currently Resume spironolactone  Hyperlipidemia associated with type 2 diabetes mellitus (HCC) No longer taking Zetia  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) Strict ETOH cessation is encouraged No current concerns in this regard Check LFTs in AM Obesity (BMI 30-39.9) Body mass index is 33.05 kg/m.[name]  Weight loss should be encouraged on an ongoing basis Resume semaglutide  after discharge Outpatient PCP/bariatric medicine f/u encouraged Significantly low or high BMI is associated with higher medical risk including morbidity and mortality       Consultants: Orthopedics PT OT   Procedures: Quadriceps tendon repair 12/3   Antibiotics: None    30 Day Unplanned Readmission Risk Score    Flowsheet Row ED to Hosp-Admission (Current) from 04/20/2024 in [facility]  30 Day Unplanned Readmission Risk Score (%) 11.66 Filed at 04/23/2024 0802    This score is the patient's risk of an unplanned readmission within 30 days of being discharged (0 -100%). The score is based on dignosis, age, lab data, medications, orders, and past utilization.   Low:  0-14.9   Medium: 15-21.9   High: 22-29.9   Extreme: 30 and above           Subjective: Pain appears to be reasonably controlled.  Had not participated with PT this AM but worked with them this afternoon  and was able to get to edge of bed but not stand.  OT consult is pending.   Objective: Vitals:   04/23/24 0445 04/23/24 1314  BP: (!) 172/93 (!) 140/84  Pulse: 88 (!)  110  Resp: 18 18  Temp: 98 F (36.7 C) 98.4 F (36.9 C)  SpO2: 99% 99%    Intake/Output Summary (Last 24 hours) at 04/23/2024 1616 Last data filed at 04/23/2024 1100 Gross per 24 hour  Intake 818 ml  Output 400 ml  Net 418 ml   Filed Weights   04/20/24 1751  Weight: 95.7 kg    Exam:  General:  Appears calm and comfortable and is in NAD Eyes:  normal lids, iris ENT:  grossly normal hearing, lips & tongue, mmm Cardiovascular:  RRR. No LE edema.  Respiratory:   CTA bilaterally with no wheezes/rales/rhonchi.  Normal respiratory effort. Abdomen:  soft, NT, ND Skin:  no rash or induration seen on limited exam; wound is clean and appears to be healing well (nurse changed the dressing during my afternoon visit) Musculoskeletal:  B knee immobilizers in place, R shoulder sling in place  Psychiatric:  grossly normal mood and affect, speech fluent and appropriate, AOx3 Neurologic:  CN 2-12 grossly intact, moves all extremities in coordinated fashion  Data Reviewed: I have reviewed the patient's lab results since admission.  Pertinent labs for today include:   None today     Family Communication: None present in AM; I went back to the bedside to see the wife in the afternoon  Mobility: PT/OT Consulted and are recommending - Skilled Nursing-Short Term Rehab (<3 Hours/Day)04/23/2024 1220    Code Status: Full Code   Disposition: Status is: Inpatient Remains inpatient appropriate because: ongoing management     Time spent: 50 minutes  Unresulted Labs (From admission, onward)     Start     Ordered   04/24/24 0500  CBC  Tomorrow morning,   R        04/23/24 0827   04/24/24 0500  Basic metabolic panel with GFR  Tomorrow morning,   R        04/23/24 0827             Author: Delon Herald, MD 04/23/2024 4:16 PM  For on call review [email or web address].

## 2024-04-23 NOTE — Plan of Care (Signed)
  Problem: Tissue Perfusion: Goal: Adequacy of tissue perfusion will improve Outcome: Progressing   Problem: Pain Managment: Goal: General experience of comfort will improve and/or be controlled Outcome: Progressing   Problem: Safety: Goal: Ability to remain free from injury will improve Outcome: Progressing   Problem: Skin Integrity: Goal: Risk for impaired skin integrity will decrease Outcome: Progressing

## 2024-04-23 NOTE — Assessment & Plan Note (Addendum)
 Also with R shoulder injury MRI with bony Bankart injury with labral defect and large Hill-Sachs impaction of the posterolateral humeral head as well as full-thickness tears of supraspinatus and infraspinatus tendons This also appears to need surgical intervention, but will defer to orthopedics as an outpatient For now, nonweightbearing with R shoulder sling and swath Of note, he previously has a LEFT shoulder MRI (10/23025), also with full-thickness tears of the supraspinatus and infraspinatus tendons; this was reviewed by Dr. Dozier on 12/1 and is planned for conservative treatment with PT and steroid injection (subacromial injection given) for now and need for shoulder replacement in the future if unsuccessful

## 2024-04-23 NOTE — Assessment & Plan Note (Addendum)
 In addition to the traumatic injuries as above, he reports R knee pain occurring from prior fall R knee MRI also shows quadriceps tendon rupture, although this one is not complete Orthopedics recommends WBAT with knee immobilizer and PT, conservative treatment at this time

## 2024-04-23 NOTE — Assessment & Plan Note (Signed)
 No longer taking Zetia 

## 2024-04-23 NOTE — Assessment & Plan Note (Signed)
 Strict ETOH cessation is encouraged No current concerns in this regard Check LFTs in AM

## 2024-04-23 NOTE — Progress Notes (Signed)
 Subjective:  Patient reports pain as moderate when trying to move the leg.  Is relatively mild when resting  Of note the patient has been taking his knee immobilizers off while sleeping.  Yesterday's total administered Morphine  [place] Equivalents: 67   Objective:   VITALS:   Vitals:   04/22/24 0406 04/22/24 1214 04/22/24 1943 04/23/24 0445  BP: (!) 144/89 (!) 144/79 (!) 160/89 (!) 172/93  Pulse: 92 97 93 88  Resp: 18 18 18 18   Temp: 97.6 F (36.4 C) 98.7 F (37.1 C) 98.1 F (36.7 C) 98 F (36.7 C)  TempSrc:   Oral Oral  SpO2: 97% 99% 100% 99%  Weight:      Height:        Well-appearing gentleman no acute distress.  Examination of left knee reveals that the incision line is intact.  No erythema.  Mildly tender to palpation.  Neurovascular intact distally.  Intact station in the saphenous, sural, tibial peroneal nerve distributions.  5 5 strength EHL, FHL, gastrocs, no calf pain  Examination of the right knee reveals he had flex it to approximately 30 degrees on his own.  He is still able to hold it in extension when I elevate form.  He is not actively extended on his own but can hold it in place once it is elevated for him.  No calf tenderness.  Right shoulder is still painful and held in a sling.  Lab Results  Component Value Date   WBC 8.4 04/22/2024   HGB 13.3 04/22/2024   HCT 40.4 04/22/2024   MCV 104.1 (H) 04/22/2024   PLT 270 04/22/2024   BMET    Component Value Date/Time   NA 131 (L) 04/22/2024 0518   K 4.9 04/22/2024 0518   CL 98 04/22/2024 0518   CO2 21 (L) 04/22/2024 0518   GLUCOSE 396 (H) 04/22/2024 0518   BUN 20 04/22/2024 0518   CREATININE 0.84 04/22/2024 0518   CREATININE 0.75 12/11/2023 1135   CALCIUM 9.4 04/22/2024 0518   EGFR 101 12/11/2023 1135   GFRNONAA >60 04/22/2024 0518        Assessment/Plan: 2 Days Post-Op   Principal Problem:   Rupture of quadriceps tendon, left, initial encounter Active Problems:   Hyperlipidemia  associated with type 2 diabetes mellitus (HCC)   HYPERTENSION, BENIGN ESSENTIAL   Obesity (BMI 30-39.9)   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)   Uncontrolled type 2 diabetes mellitus with hyperglycemia, with long-term current use of insulin  (HCC)   PVD (peripheral vascular disease)   Shoulder dislocation, right, initial encounter   Right knee pain   Postop day 2 status post open repair of left quadriceps, nonoperative care of right quadriceps tendon tear, nonoperative care of right shoulder dislocation with rotator cuff tear  Upon entering the room today, the patient had removed his knee immobilizer on both his legs.  The left leg had been held straight but the right leg was flexed.  I strongly counseled the patient and needs to keep the knee immobilizer in place at all times and that any flexion of the right knee could either cause further injury to the quadriceps tendon or lead to poor healing.  He understands these risks and has agreed to wear the knee immobilizer at all times even when sleeping.  With regards to the left knee, incision healing nicely.  We will change the dressing today.  He may be mobilized with physical therapy today.  He  should keep the knee immobilizers in place in both legs.  He may weight-bear as tolerated in the knee immobilizers.  We will continue to be nonweightbearing in the right shoulder with a sling and swath.  I did discuss the case with Dr. Barbarann yesterday.  I am agreement that he will likely need rehab placement 5 determination for this can be based after he works with therapy.    Cordella SHAUNNA Rhein 04/23/2024, 8:03 AM   Cordella Rhein, MD, MS [facility] / [place] [phone]   Contact information:   Tzzxijbd 7am-5pm epic message Dr. Rhein, or call office for patient follow up: [phone] After hours and holidays please check [email or web address] for group call information for [facility]

## 2024-04-23 NOTE — TOC Progression Note (Signed)
 Transition of Care [facility]) - Progression Note    Patient Details  Name: Zachary Lewis MRN: [id number] Date of Birth: 03-07-1961  Transition of Care [facility]) CM/SW Contact  Sonda Manuella Quill, RN Phone Number: 04/23/2024, 4:26 PM  Clinical Narrative:    Notified by Dr Barbarann that pt's wife would like to speak w/ IP CM; called his wife Zachary Lewis [phone]); she gave contact info for worker's comp CM Ethelene [place] [phone]) and claim #s: [id number] initiated 04/21/24 and [id number] initiated  04/12/24; explained to [name] that all matters are handled in conjunction w/ that office; LVM w/ #s for this RN CM  and IP CM office for Ethelene [place]; awaiting return call to discuss required info; IP CM is following.  Expected Discharge Plan: Home/Self Care Barriers to Discharge: Continued Medical Work up               Expected Discharge Plan and Services   Discharge Planning Services: CM Consult   Living arrangements for the past 2 months: Single Family Home                 DME Arranged: N/A DME Agency: NA       HH Arranged: NA HH Agency: NA         Social Drivers of Health (SDOH) Interventions SDOH Screenings   Food Insecurity: No Food Insecurity (04/22/2024)  Housing: Low Risk  (04/22/2024)  Transportation Needs: No Transportation Needs (04/22/2024)  Utilities: Not At Risk (04/22/2024)  Depression (PHQ2-9): Low Risk  (06/02/2023)  Recent Concern: Depression (PHQ2-9) - Medium Risk (05/12/2023)  Tobacco Use: Medium Risk (04/20/2024)    Readmission Risk Interventions    04/14/2023    5:27 PM  Readmission Risk Prevention Plan  Post Dischage Appt Complete  Medication Screening Complete  Transportation Screening Complete

## 2024-04-23 NOTE — Assessment & Plan Note (Addendum)
 Mechanical fall x 2 MRI with ruptured R quadriceps tendon at distal patellar attachment Pain control as needed Underwent surgical repair on 04/21/2024 Fall precautions WBAT Given diffuse orthopedic injuries, he appears likely to benefit from CIR vs. STR

## 2024-04-23 NOTE — Assessment & Plan Note (Addendum)
 BP 140/84 currently Resume spironolactone 

## 2024-04-23 NOTE — Evaluation (Signed)
 Physical Therapy Evaluation Patient Details Name: Zachary Lewis MRN: [id number] DOB: 1960/06/02 Today's Date: 04/23/2024  History of Present Illness  Pt is a 63 y/o M admitted on 04/20/24 after presenting with c/o a fall. Pt found to have R shoulder dislocation that was reduced int he ER. Pt also found to have LLE quad tendon rupture s/p surgical repair 04/21/24. Pt with c/o chronic R knee pain 2/2 a fall & found to have incomplete quad tendon rupture. PMH: DM2, HTN, HLD, PAD, NASH cirrhosis with ascites, obesity  Clinical Impression  Pt seen for PT evaluation with pt agreeable, wife present for session. Prior to fall in November pt was independent without AD, working but after fall started using RW then progressed to [facility], but experienced a 2nd fall. PT educated pt on WBAT, no knee ROM, RUE NWB in sling at all times; adjusted BLE KI's & RUE sling & educated pt & wife on positioning. Spent significant time discussing potential DME needs & post acute rehab recommendations. Pt requires mod assist +1-2 for bed mobility with hospital bed features, max<>total assist to scoot to sitting EOB. Unsafe to attempt standing trials without +2 & pt not eager to attempt today. Recommend ongoing PT services to progress mobility as able to reduce fall risk & caregiver burden, increase independence. Pt would benefit from post acute rehab <3 hours therapy/day upon d/c.        If plan is discharge home, recommend the following: Two people to help with walking and/or transfers;Two people to help with bathing/dressing/bathroom;Assist for transportation;Assistance with cooking/housework;Help with stairs or ramp for entrance   Can travel by private vehicle   No    Equipment Recommendations Rolling walker (2 wheels);BSC/3in1;Hospital bed;Wheelchair (measurements PT);Hoyer lift;Wheelchair cushion (measurements PT) (elevating leg rests & L sided one arm drive for w/c)  Recommendations for Other Services       Functional  Status Assessment Patient has had a recent decline in their functional status and demonstrates the ability to make significant improvements in function in a reasonable and predictable amount of time.     Precautions / Restrictions Precautions Precautions: Fall Precaution/Restrictions Comments: No ROM B knees Required Braces or Orthoses: Knee Immobilizer - Left;Knee Immobilizer - Right;Sling Knee Immobilizer - Right: On at all times Knee Immobilizer - Left: On at all times Restrictions Weight Bearing Restrictions Per Provider Order: Yes RUE Weight Bearing Per Provider Order: Non weight bearing RLE Weight Bearing Per Provider Order: Weight bearing as tolerated LLE Weight Bearing Per Provider Order: Weight bearing as tolerated      Mobility  Bed Mobility Overal bed mobility: Needs Assistance Bed Mobility: Supine to Sit, Sit to Supine     Supine to sit: Mod assist, +2 for safety/equipment (assistance to elevate BLE onto bed.) Sit to supine: Max assist, HOB elevated, Used rails, Mod assist (exit L side of bed, assistance to move LLE to EOB, upright trunk)   General bed mobility comments: Pt able to pull with LUE to scoot to [facility] with bed in trendelenburg position.    Transfers                   General transfer comment: max<>total assist to scoot to sitting EOB    Ambulation/Gait                  Stairs            Wheelchair Mobility     Tilt Bed    Modified Rankin (Stroke Patients Only)  Balance Overall balance assessment: Needs assistance Sitting-balance support: Feet supported Sitting balance-Leahy Scale: Fair Sitting balance - Comments: supervision static sitting, elevated EOB with BLE heels resting on floor                                     Pertinent Vitals/Pain Pain Assessment Pain Assessment: 0-10 Pain Score: 10-Worst pain ever Pain Location: L knee Pain Descriptors / Indicators: Discomfort, Grimacing,  Guarding Pain Intervention(s): Monitored during session, Limited activity within patient's tolerance, Repositioned (educated pt on breathing techniques)    Home Living Family/patient expects to be discharged to:: Private residence Living Arrangements: Spouse/significant other Available Help at Discharge: Family (wife can work from home some) Type of Home: House Home Access: Stairs to enter Entrance Stairs-Rails: Right;Left (wideset) Entrance Stairs-Number of Steps: 4 Alternate Level Stairs-Number of Steps: flight Home Layout: Multi-level;1/2 bath on main level;Bed/bath upstairs Home Equipment: Cane - single Librarian, Academic (2 wheels) Additional Comments: couch & 1/2 bath downstairs    Prior Function Prior Level of Function : Working/employed             Mobility Comments: Prior to fall injuring RLE in November pt was independent without AD, working at [employer] After fall, pt started using RW then transitioned to [facility], was work & using SPC when he fell & injured LLE.       Extremity/Trunk Assessment   Upper Extremity Assessment Upper Extremity Assessment: Left hand dominant RUE Deficits / Details: NWB with no ROM per ortho LUE Deficits / Details: hx of shoulder/rotator cuff injury    Lower Extremity Assessment Lower Extremity Assessment: RLE deficits/detail;LLE deficits/detail RLE Deficits / Details: BLE in knee immobilizers at all times, ankle ROM WFL LLE Deficits / Details: BLE in knee immobilizers at all times, ankle ROM WFL       Communication   Communication Communication: No apparent difficulties    Cognition Arousal: Alert Behavior During Therapy: Anxious   PT - Cognitive impairments: Memory                       PT - Cognition Comments: decreased short term recall during session Following commands: Impaired Following commands impaired: Follows multi-step commands inconsistently, Follows multi-step commands with increased time      Cueing Cueing Techniques: Verbal cues     General Comments      Exercises Other Exercises Other Exercises: PT adjusted positioning of RUE sling (not on upon PT arrival) & BLE knee immobilizers.   Assessment/Plan    PT Assessment Patient needs continued PT services  PT Problem List Decreased strength;Decreased coordination;Cardiopulmonary status limiting activity;Pain;Decreased range of motion;Decreased cognition;Decreased activity tolerance;Decreased knowledge of use of DME;Decreased balance;Decreased safety awareness;Decreased mobility;Decreased knowledge of precautions       PT Treatment Interventions DME instruction;Balance training;Gait training;Neuromuscular re-education;Stair training;Functional mobility training;Patient/family education;Therapeutic activities;Therapeutic exercise;Manual techniques;Wheelchair mobility training;Modalities    PT Goals (Current goals can be found in the Care Plan section)  Acute Rehab PT Goals Patient Stated Goal: decreased pain, go to rehab PT Goal Formulation: With patient/family Time For Goal Achievement: 05/07/24 Potential to Achieve Goals: Fair Additional Goals Additional Goal #1: Pt will propel one arm drive w/c x 899 ft with mod I to increase independence with mobility.    Frequency Min 3X/week     Co-evaluation               AM-PAC PT 6  Clicks Mobility  Outcome Measure Help needed turning from your back to your side while in a flat bed without using bedrails?: A Lot Help needed moving from lying on your back to sitting on the side of a flat bed without using bedrails?: Total Help needed moving to and from a bed to a chair (including a wheelchair)?: Total Help needed standing up from a chair using your arms (e.g., wheelchair or bedside chair)?: Total Help needed to walk in hospital room?: Total Help needed climbing 3-5 steps with a railing? : Total 6 Click Score: 7    End of Session Equipment Utilized During Treatment:  Right knee immobilizer;Left knee immobilizer (RUE sling) Activity Tolerance: Patient limited by pain Patient left: in bed;with call bell/phone within reach;with bed alarm set;with family/visitor present Nurse Communication: Mobility status;Weight bearing status;Precautions PT Visit Diagnosis: Muscle weakness (generalized) (M62.81);Pain;Other abnormalities of gait and mobility (R26.89);Difficulty in walking, not elsewhere classified (R26.2);History of falling (Z91.81) Pain - Right/Left: Left Pain - part of body: Knee    Time: 1132-1203 PT Time Calculation (min) (ACUTE ONLY): 31 min   Charges:   PT Evaluation $PT Eval Moderate Complexity: 1 Mod   PT General Charges $$ ACUTE PT VISIT: 1 Visit         Richerd Pinal, PT, DPT 04/23/24, 12:24 PM   Richerd CHRISTELLA Pinal 04/23/2024, 12:22 PM

## 2024-04-23 NOTE — Plan of Care (Signed)
  Problem: Coping: Goal: Ability to adjust to condition or change in health will improve Outcome: Progressing   Problem: Nutrition: Goal: Adequate nutrition will be maintained Outcome: Progressing   Problem: Coping: Goal: Level of anxiety will decrease Outcome: Progressing   Problem: Pain Managment: Goal: General experience of comfort will improve and/or be controlled Outcome: Progressing   Problem: Safety: Goal: Ability to remain free from injury will improve Outcome: Progressing

## 2024-04-23 NOTE — Assessment & Plan Note (Addendum)
 Last A1c was 8.6, poor control Hold semaglutide  Continue glargine at 20 units BID (rather than PTA 40 units daily) Cover with moderate-scale SSI Carb modified diet

## 2024-04-23 NOTE — Progress Notes (Signed)
 Inpatient Rehab Admissions Coordinator:    CIR consult received. Note Pt. Had not attempted transfers yet. Will need to see improved participation with therapies before a CIR admission could be considered.   Leita Kleine, MS, CCC-SLP Rehab Admissions Coordinator  [phone] (celll) [phone] (office)

## 2024-04-24 DIAGNOSIS — S76112A Strain of left quadriceps muscle, fascia and tendon, initial encounter: Secondary | ICD-10-CM | POA: Diagnosis not present

## 2024-04-24 LAB — CBC
HCT: 39.6 % (ref 39.0–52.0)
Hemoglobin: 13.4 g/dL (ref 13.0–17.0)
MCH: 34.6 pg — ABNORMAL HIGH (ref 26.0–34.0)
MCHC: 33.8 g/dL (ref 30.0–36.0)
MCV: 102.3 fL — ABNORMAL HIGH (ref 80.0–100.0)
Platelets: 263 K/uL (ref 150–400)
RBC: 3.87 MIL/uL — ABNORMAL LOW (ref 4.22–5.81)
RDW: 13.9 % (ref 11.5–15.5)
WBC: 9.8 K/uL (ref 4.0–10.5)
nRBC: 0 % (ref 0.0–0.2)

## 2024-04-24 LAB — GLUCOSE, CAPILLARY
Glucose-Capillary: 230 mg/dL — ABNORMAL HIGH (ref 70–99)
Glucose-Capillary: 277 mg/dL — ABNORMAL HIGH (ref 70–99)
Glucose-Capillary: 304 mg/dL — ABNORMAL HIGH (ref 70–99)
Glucose-Capillary: 189 mg/dL — ABNORMAL HIGH (ref 70–99)

## 2024-04-24 LAB — BASIC METABOLIC PANEL WITH GFR
Anion gap: 12 (ref 5–15)
BUN: 15 mg/dL (ref 8–23)
CO2: 25 mmol/L (ref 22–32)
Calcium: 9.7 mg/dL (ref 8.9–10.3)
Chloride: 98 mmol/L (ref 98–111)
Creatinine, Ser: 0.7 mg/dL (ref 0.61–1.24)
GFR, Estimated: >60 mL/min (ref >60)
Glucose, Bld: 208 mg/dL — ABNORMAL HIGH (ref 70–99)
Potassium: 4.4 mmol/L (ref 3.5–5.1)
Sodium: 135 mmol/L (ref 135–145)

## 2024-04-24 MED ORDER — INSULIN GLARGINE-YFGN 100 UNIT/ML ~~LOC~~ SOLN
25.0000 [IU] | Freq: Two times a day (BID) | SUBCUTANEOUS | Status: DC
  Administered 2024-04-24 – 2024-04-25 (×3): 25 [IU] via SUBCUTANEOUS
  Filled 2024-04-24 (×4): qty 0.25

## 2024-04-24 NOTE — Assessment & Plan Note (Signed)
 In addition to the traumatic injuries as above, he reports R knee pain occurring from prior fall R knee MRI also shows quadriceps tendon rupture, although this one is not complete Orthopedics recommends WBAT with knee immobilizer and PT, conservative treatment at this time

## 2024-04-24 NOTE — Assessment & Plan Note (Signed)
 BP 140/84 currently Resume spironolactone 

## 2024-04-24 NOTE — Assessment & Plan Note (Signed)
 Resume Plavix  12/6 Continue ASA

## 2024-04-24 NOTE — Plan of Care (Signed)
  Problem: Education: Goal: Ability to describe self-care measures that may prevent or decrease complications (Diabetes Survival Skills Education) will improve Outcome: Progressing Goal: Individualized Educational Video(s) Outcome: Progressing   Problem: Coping: Goal: Ability to adjust to condition or change in health will improve Outcome: Progressing   Problem: Fluid Volume: Goal: Ability to maintain a balanced intake and output will improve Outcome: Progressing   Problem: Health Behavior/Discharge Planning: Goal: Ability to identify and utilize available resources and services will improve Outcome: Progressing Goal: Ability to manage health-related needs will improve Outcome: Progressing   Problem: Metabolic: Goal: Ability to maintain appropriate glucose levels will improve Outcome: Progressing   Problem: Nutritional: Goal: Maintenance of adequate nutrition will improve Outcome: Progressing Goal: Progress toward achieving an optimal weight will improve Outcome: Progressing   Problem: Skin Integrity: Goal: Risk for impaired skin integrity will decrease Outcome: Progressing   Problem: Tissue Perfusion: Goal: Adequacy of tissue perfusion will improve Outcome: Progressing   Problem: Education: Goal: Knowledge of General Education information will improve Description: Including pain rating scale, medication(s)/side effects and non-pharmacologic comfort measures Outcome: Progressing   Problem: Health Behavior/Discharge Planning: Goal: Ability to manage health-related needs will improve Outcome: Progressing   Problem: Clinical Measurements: Goal: Ability to maintain clinical measurements within normal limits will improve Outcome: Progressing Goal: Will remain free from infection Outcome: Progressing Goal: Diagnostic test results will improve Outcome: Progressing Goal: Respiratory complications will improve Outcome: Progressing Goal: Cardiovascular complication will  be avoided Outcome: Progressing   Problem: Activity: Goal: Risk for activity intolerance will decrease Outcome: Progressing   Problem: Nutrition: Goal: Adequate nutrition will be maintained Outcome: Progressing   Problem: Coping: Goal: Level of anxiety will decrease Outcome: Progressing   Problem: Elimination: Goal: Will not experience complications related to urinary retention Outcome: Progressing   Problem: Safety: Goal: Ability to remain free from injury will improve Outcome: Progressing   Problem: Skin Integrity: Goal: Risk for impaired skin integrity will decrease Outcome: Progressing   Problem: Elimination: Goal: Will not experience complications related to bowel motility Outcome: Not Progressing   Problem: Pain Managment: Goal: General experience of comfort will improve and/or be controlled Outcome: Not Progressing

## 2024-04-24 NOTE — Assessment & Plan Note (Signed)
 Mechanical fall x 2 MRI with ruptured R quadriceps tendon at distal patellar attachment Pain control as needed Underwent surgical repair on 04/21/2024 Fall precautions WBAT Given diffuse orthopedic injuries, he appears likely to benefit from CIR vs. STR

## 2024-04-24 NOTE — Progress Notes (Signed)
 Progress Note   Patient: Zachary Lewis FMW:[id number] DOB: November 16, 1960 DOA: 04/20/2024     4 DOS: the patient was seen and examined on 04/24/2024   Brief hospital course: 63yo with h/o T2DM, HTN, HLD, PAD, NASH cirrhosis with ascites, and class 1 obesity who presented on 12/2 with a fall.  R shoulder xray revealed anterior and inferior dislocation of humeral head with likely Hill-Sachs impact fracture; this was reduced in the ER and orthopedics is ordering an MRI for further evaluation.  Also with L quadriceps tendon rupture, underwent repair on 12/3.    Assessment & Plan Rupture of quadriceps tendon, left, initial encounter Mechanical fall x 2 MRI with ruptured R quadriceps tendon at distal patellar attachment Pain control as needed Underwent surgical repair on 04/21/2024 Fall precautions WBAT Given diffuse orthopedic injuries, he appears likely to benefit from CIR vs. STR Shoulder dislocation, right, initial encounter Also with R shoulder injury MRI with bony Bankart injury with labral defect and large Hill-Sachs impaction of the posterolateral humeral head as well as full-thickness tears of supraspinatus and infraspinatus tendons This also appears to need surgical intervention, but will defer to orthopedics as an outpatient For now, nonweightbearing with R shoulder sling and swath Of note, he previously has a LEFT shoulder MRI (10/23025), also with full-thickness tears of the supraspinatus and infraspinatus tendons; this was reviewed by Dr. Dozier on 12/1 and is planned for conservative treatment with PT and steroid injection (subacromial injection given) for now and need for shoulder replacement in the future if unsuccessful Right knee pain In addition to the traumatic injuries as above, he reports R knee pain occurring from prior fall R knee MRI also shows quadriceps tendon rupture, although this one is not complete Orthopedics recommends WBAT with knee immobilizer and PT,  conservative treatment at this time Uncontrolled type 2 diabetes mellitus with hyperglycemia, with long-term current use of insulin  (HCC) Last A1c was 8.6, poor control Hold semaglutide  Continue glargine (40 units PTA), currently increasing to 25 units BID Cover with moderate-scale SSI Carb modified diet  PVD (peripheral vascular disease) Resume Plavix  12/6 Continue ASA  HYPERTENSION, BENIGN ESSENTIAL BP 140/84 currently Resume spironolactone  Hyperlipidemia associated with type 2 diabetes mellitus (HCC) No longer taking Zetia  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) Strict ETOH cessation is encouraged No current concerns in this regard Check LFTs in AM Obesity (BMI 30-39.9) Body mass index is 33.05 kg/m.[name]  Weight loss should be encouraged on an ongoing basis Resume semaglutide  after discharge Outpatient PCP/bariatric medicine f/u encouraged Significantly low or high BMI is associated with higher medical risk including morbidity and mortality        Consultants: Orthopedics PT OT   Procedures: Quadriceps tendon repair 12/3   Antibiotics: None  30 Day Unplanned Readmission Risk Score    Flowsheet Row ED to Hosp-Admission (Current) from 04/20/2024 in [facility]  30 Day Unplanned Readmission Risk Score (%) 12.1 Filed at 04/24/2024 0801    This score is the patient's risk of an unplanned readmission within 30 days of being discharged (0 -100%). The score is based on dignosis, age, lab data, medications, orders, and past utilization.   Low:  0-14.9   Medium: 15-21.9   High: 22-29.9   Extreme: 30 and above           Subjective: No changes.  Continuing with current pain regimen, doing ok.  Has not had further therapy.  Recommended for STR in  SNF currently unless he is able to progress significantly and be able to go to CIR.   Objective: Vitals:   04/24/24 0348 04/24/24 1313  BP: (!) 158/96 (!) 137/95  Pulse:  (!) 101 (!) 105  Resp: 18 17  Temp: 98.3 F (36.8 C) 98.6 F (37 C)  SpO2:  99%    Intake/Output Summary (Last 24 hours) at 04/24/2024 1607 Last data filed at 04/23/2024 1700 Gross per 24 hour  Intake 240 ml  Output 500 ml  Net -260 ml   Filed Weights   04/20/24 1751  Weight: 95.7 kg    Exam:  General:  Appears calm and comfortable and is in NAD Eyes:  normal lids, iris ENT:  grossly normal hearing, lips & tongue, mmm Cardiovascular:  RRR. No LE edema.  Respiratory:   CTA bilaterally with no wheezes/rales/rhonchi.  Normal respiratory effort. Abdomen:  soft, NT, ND Skin:  no rash or induration seen on limited exam Musculoskeletal: B knee immobilizers in place; R shoulder sling in place Psychiatric:  grossly normal and mildly somnolent mood and affect, speech fluent and appropriate, AOx3 Neurologic:  CN 2-12 grossly intact  Data Reviewed: I have reviewed the patient's lab results since admission.  Pertinent labs for today include:   Glucose 208, 230 Stable CBC     Family Communication: Daughter was present  Mobility: PT/OT Consulted and are recommending - Skilled Nursing-Short Term Rehab (<3 Hours/Day)04/23/2024 1220    Code Status: Full Code   Disposition: Status is: Inpatient Remains inpatient appropriate because: awaiting placement     Time spent: 35 minutes  Unresulted Labs (From admission, onward)    None        Author: Delon Herald, MD 04/24/2024 4:07 PM  For on call review [email or web address].

## 2024-04-24 NOTE — Assessment & Plan Note (Addendum)
 Last A1c was 8.6, poor control Hold semaglutide  Continue glargine (40 units PTA), currently increasing to 25 units BID Cover with moderate-scale SSI Carb modified diet

## 2024-04-24 NOTE — Assessment & Plan Note (Signed)
 No longer taking Zetia 

## 2024-04-24 NOTE — Progress Notes (Signed)
     Subjective: Moderate to severe pain at left leg, worse with movement. No other complaints.   Yesterday's total administered Morphine  [place] Equivalents: 51   Objective:   VITALS:   Vitals:   04/23/24 0445 04/23/24 1314 04/23/24 2023 04/24/24 0348  BP: (!) 172/93 (!) 140/84 (!) 140/91 (!) 158/96  Pulse: 88 (!) 110  (!) 101  Resp: 18 18 17 18   Temp: 98 F (36.7 C) 98.4 F (36.9 C) 98.7 F (37.1 C) 98.3 F (36.8 C)  TempSrc: Oral Oral Oral Oral  SpO2: 99% 99% 98%   Weight:      Height:        Well-appearing gentleman no acute distress.  Left knee - knee immobilizer in place. Patient states new mepilexes were placed this morning. No erythema or drainage noted.  Mildly tender to palpation.  Neurovascular intact distally.  Intact station in the saphenous, sural, tibial peroneal nerve distributions.  5 5 strength EHL, FHL, gastrocs, no calf pain.  Examination of the right knee reveals knee immobilizer in place, can perform straight leg raise in knee immobilizer.  No calf tenderness. Dorsiflexion and plantarflexion intact at ankle. + DP pulse. Sensation intact to foot.   Right shoulder not currently in sling as patient's family was washing his back. I have put him back in his sling today. All fingers flex, extend, and abduct. Sensation intact to all fingers.   Lab Results  Component Value Date   WBC 9.8 04/24/2024   HGB 13.4 04/24/2024   HCT 39.6 04/24/2024   MCV 102.3 (H) 04/24/2024   PLT 263 04/24/2024   BMET    Component Value Date/Time   NA 135 04/24/2024 0613   K 4.4 04/24/2024 0613   CL 98 04/24/2024 0613   CO2 25 04/24/2024 0613   GLUCOSE 208 (H) 04/24/2024 0613   BUN 15 04/24/2024 0613   CREATININE 0.70 04/24/2024 0613   CREATININE 0.75 12/11/2023 1135   CALCIUM 9.7 04/24/2024 0613   EGFR 101 12/11/2023 1135   GFRNONAA >60 04/24/2024 9386    Assessment/Plan: 3 Days Post-Op   Principal Problem:   Rupture of quadriceps tendon, left, initial  encounter Active Problems:   Hyperlipidemia associated with type 2 diabetes mellitus (HCC)   HYPERTENSION, BENIGN ESSENTIAL   Obesity (BMI 30-39.9)   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)   Uncontrolled type 2 diabetes mellitus with hyperglycemia, with long-term current use of insulin  (HCC)   PVD (peripheral vascular disease)   Shoulder dislocation, right, initial encounter   Right knee pain   Postop day 3 status post open repair of left quadriceps, nonoperative care of right quadriceps tendon tear, nonoperative care of right shoulder dislocation with rotator cuff tear  Patient aware he needs to continue knee immobilizer use on both legs as well as sling on right shoulder.    He should keep the knee immobilizers in place in both legs.  He may weight-bear as tolerated in the knee immobilizers. Continue working with PT.  We will continue to be nonweightbearing in the right shoulder with a sling and swath.  Plan for SNF at discharge.   [employer] MARLA Daring 04/24/2024, 9:39 AM   Contact information:   Tzzxijbd 7am-5pm epic message Dr. Reyne, or call office for patient follow up: [phone] After hours and holidays please check [email or web address] for group call information for [facility]

## 2024-04-24 NOTE — Assessment & Plan Note (Signed)
 Also with R shoulder injury MRI with bony Bankart injury with labral defect and large Hill-Sachs impaction of the posterolateral humeral head as well as full-thickness tears of supraspinatus and infraspinatus tendons This also appears to need surgical intervention, but will defer to orthopedics as an outpatient For now, nonweightbearing with R shoulder sling and swath Of note, he previously has a LEFT shoulder MRI (10/23025), also with full-thickness tears of the supraspinatus and infraspinatus tendons; this was reviewed by Dr. Dozier on 12/1 and is planned for conservative treatment with PT and steroid injection (subacromial injection given) for now and need for shoulder replacement in the future if unsuccessful

## 2024-04-24 NOTE — Assessment & Plan Note (Addendum)
 Body mass index is 33.05 kg/m.[name]  Weight loss should be encouraged on an ongoing basis Resume semaglutide  after discharge Outpatient PCP/bariatric medicine f/u encouraged Significantly low or high BMI is associated with higher medical risk including morbidity and mortality

## 2024-04-24 NOTE — Assessment & Plan Note (Signed)
 Strict ETOH cessation is encouraged No current concerns in this regard Check LFTs in AM

## 2024-04-25 DIAGNOSIS — S76112A Strain of left quadriceps muscle, fascia and tendon, initial encounter: Secondary | ICD-10-CM | POA: Diagnosis not present

## 2024-04-25 LAB — GLUCOSE, CAPILLARY
Glucose-Capillary: 183 mg/dL — ABNORMAL HIGH (ref 70–99)
Glucose-Capillary: 286 mg/dL — ABNORMAL HIGH (ref 70–99)
Glucose-Capillary: 153 mg/dL — ABNORMAL HIGH (ref 70–99)
Glucose-Capillary: 192 mg/dL — ABNORMAL HIGH (ref 70–99)

## 2024-04-25 MED ORDER — OXYCODONE HCL 5 MG PO TABS
5.0000 mg | ORAL_TABLET | ORAL | Status: DC | PRN
  Administered 2024-04-25 – 2024-05-01 (×16): 5 mg via ORAL
  Filled 2024-04-25 (×16): qty 1

## 2024-04-25 MED ORDER — ENOXAPARIN SODIUM 40 MG/0.4ML IJ SOSY
40.0000 mg | PREFILLED_SYRINGE | INTRAMUSCULAR | Status: AC
  Administered 2024-04-25 – 2024-05-01 (×7): 40 mg via SUBCUTANEOUS
  Filled 2024-04-25 (×7): qty 0.4

## 2024-04-25 MED ORDER — MUSCLE RUB 10-15 % EX CREA: TOPICAL_CREAM | CUTANEOUS | Status: DC | PRN

## 2024-04-25 MED ORDER — INSULIN GLARGINE 100 UNIT/ML ~~LOC~~ SOLN
30.0000 [IU] | Freq: Two times a day (BID) | SUBCUTANEOUS | Status: DC
  Administered 2024-04-25 – 2024-05-01 (×12): 30 [IU] via SUBCUTANEOUS
  Filled 2024-04-25 (×14): qty 0.3

## 2024-04-25 NOTE — Assessment & Plan Note (Signed)
 No longer taking Zetia 

## 2024-04-25 NOTE — Assessment & Plan Note (Addendum)
 Last A1c was 8.6, poor control Hold semaglutide  Continue glargine (40 units PTA), currently increasing to 30 units BID Cover with moderate-scale SSI Carb modified diet resumed, appears to have been changed post-operatively

## 2024-04-25 NOTE — Progress Notes (Addendum)
 Progress Note   Patient: Zachary Lewis FMW:[id number] DOB: 1960-08-30 DOA: 04/20/2024     5 DOS: the patient was seen and examined on 04/25/2024   Brief hospital course: 63yo with h/o T2DM, HTN, HLD, PAD, NASH cirrhosis with ascites, and class 1 obesity who presented on 12/2 with a fall.  R shoulder xray revealed anterior and inferior dislocation of humeral head with likely Hill-Sachs impact fracture; this was reduced in the ER and orthopedics is ordering an MRI for further evaluation.  Also with L quadriceps tendon rupture, underwent repair on 12/3.    Assessment & Plan Rupture of quadriceps tendon, left, initial encounter Mechanical fall x 2 MRI with ruptured R quadriceps tendon at distal patellar attachment Pain control as needed Underwent surgical repair on 04/21/2024 Fall precautions WBAT Given diffuse orthopedic injuries, he appears likely to benefit from CIR vs. 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 - prefers [facility] or [facility] (also listed [facility] and [facility], although these beds are more difficult to obtain for patients not in those communities); [facility] is aware and FL2 has not been signed Shoulder dislocation, right, initial encounter Also with R shoulder injury MRI with bony Bankart injury with labral defect and large Hill-Sachs impaction of the posterolateral humeral head as well as full-thickness tears of supraspinatus and infraspinatus tendons This also appears to need surgical intervention, but will defer to orthopedics as an outpatient For now, nonweightbearing with R shoulder sling and swath Of note, he previously has a LEFT shoulder MRI (10/23025), also with full-thickness tears of the supraspinatus and infraspinatus tendons; this was reviewed by Dr. Dozier on 12/1 and is planned for conservative treatment with PT and steroid injection (subacromial injection given) for now and need for shoulder  replacement in the future if unsuccessful Right knee pain In addition to the traumatic injuries as above, he reported R knee pain occurring from prior fall R knee MRI also shows quadriceps tendon rupture, although this one is not complete Orthopedics recommends WBAT with knee immobilizer and PT, conservative treatment at this time Uncontrolled type 2 diabetes mellitus with hyperglycemia, with long-term current use of insulin  (HCC) Last A1c was 8.6, poor control Hold semaglutide  Continue glargine (40 units PTA), currently increasing to 30 units BID Cover with moderate-scale SSI Carb modified diet resumed, appears to have been changed post-operatively PVD (peripheral vascular disease) Resume Plavix  12/6 Continue ASA  HYPERTENSION, BENIGN ESSENTIAL BP 140/84 currently Resume spironolactone  Hyperlipidemia associated with type 2 diabetes mellitus (HCC) No longer taking Zetia  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) Strict ETOH cessation is encouraged No current concerns in this regard Check LFTs in AM Obesity (BMI 30-39.9) Body mass index is 33.05 kg/m.[name]  Weight loss should be encouraged on an ongoing basis Resume semaglutide  after discharge Outpatient PCP/bariatric medicine f/u encouraged Significantly low or high BMI is associated with higher medical risk including morbidity and mortality       Consultants: Orthopedics PT OT   Procedures: Quadriceps tendon repair 12/3   Antibiotics: None  30 Day Unplanned Readmission Risk Score    Flowsheet Row ED to Hosp-Admission (Current) from 04/20/2024 in [facility]  30 Day Unplanned Readmission Risk Score (%) 12.29 Filed at 04/25/2024 0801    This score is the patient's risk of an unplanned readmission within 30 days of being discharged (0 -100%). The score is based on dignosis, age, lab data, medications, orders,  and past utilization.   Low:  0-14.9   Medium: 15-21.9    High: 22-29.9   Extreme: 30 and above           Subjective: Has not been OOB since with PT several days ago.  Likely to need STR in SNF.  No new concerns.   Objective: Vitals:   04/25/24 0442 04/25/24 1200  BP: (!) 123/100 124/85  Pulse: 91 (!) 102  Resp: 18 20  Temp: 98.2 F (36.8 C) 98.5 F (36.9 C)  SpO2:  100%    Intake/Output Summary (Last 24 hours) at 04/25/2024 1332 Last data filed at 04/25/2024 0900 Gross per 24 hour  Intake 380 ml  Output 1000 ml  Net -620 ml   Filed Weights   04/20/24 1751  Weight: 95.7 kg    Exam:  General:  Appears calm and comfortable and is in NAD Eyes:  normal lids, iris ENT:  grossly normal hearing, lips & tongue, mmm Cardiovascular:  RRR. No LE edema.  Respiratory:   CTA bilaterally with no wheezes/rales/rhonchi.  Normal respiratory effort. Abdomen:  soft, NT, ND Skin:  no rash or induration seen on limited exam Musculoskeletal:  R shoulder sling out of place; B knee immobilizers in place Psychiatric:  blunted mood and affect, speech fluent and appropriate, AOx3 Neurologic:  CN 2-12 grossly intact, moves all extremities in coordinated fashion  Data Reviewed: I have reviewed the patient's lab results since admission.  Pertinent labs for today include:   None today     Family Communication: None present; I spoke with his wife by telephone while in the room  Mobility: PT/OT Consulted and are recommending - Skilled Nursing-Short Term Rehab (<3 Hours/Day)04/23/2024 1220    Code Status: Full Code   Disposition: Status is: Inpatient Remains inpatient appropriate because: awaiting placement     Time spent: 50 minutes  Unresulted Labs (From admission, onward)     Start     Ordered   04/26/24 0500  CBC  Tomorrow morning,   R        04/25/24 0901   04/26/24 0500  Basic metabolic panel with GFR  Tomorrow morning,   R        04/25/24 0901             Author: Delon Herald, MD 04/25/2024 1:32 PM  For on call  review [email or web address].

## 2024-04-25 NOTE — Assessment & Plan Note (Signed)
 Strict ETOH cessation is encouraged No current concerns in this regard Check LFTs in AM

## 2024-04-25 NOTE — Plan of Care (Signed)

## 2024-04-25 NOTE — Progress Notes (Addendum)
 Subjective: Moderate at left leg, worse with movement. No other complaints. No changes from yesterday. Not in sling or right knee immobilizer when I enter the room. Right knee bent at 90 degrees.   Yesterday's total administered Morphine  [place]igram Equivalents: 45   Objective:   VITALS:   Vitals:   04/24/24 0348 04/24/24 1313 04/24/24 2008 04/25/24 0442  BP: (!) 158/96 (!) 137/95 (!) 126/95 (!) 123/100  Pulse: (!) 101 (!) 105 98 91  Resp: 18 17 18 18   Temp: 98.3 F (36.8 C) 98.6 F (37 C) 98.6 F (37 C) 98.2 F (36.8 C)  TempSrc: Oral  Oral Oral  SpO2:  99%    Weight:      Height:        Well-appearing gentleman no acute distress.  Left knee - knee immobilizer in place. Patient states new mepilexes were placed this morning. No erythema or drainage noted.  Mildly tender to palpation.  Neurovascular intact distally.  Intact station in the saphenous, sural, tibial peroneal nerve distributions.  5 5 strength EHL, FHL, gastrocs, no calf pain.  Examination of the right knee reveals no knee immobilizer in place, can perform straight leg raise in knee immobilizer.  No calf tenderness. Dorsiflexion and plantarflexion intact at ankle. + DP pulse. Sensation intact to foot.   Right shoulder not currently in sling, All fingers flex, extend, and abduct. Sensation intact to all fingers. 2+ radial pulse.  Lab Results  Component Value Date   WBC 9.8 04/24/2024   HGB 13.4 04/24/2024   HCT 39.6 04/24/2024   MCV 102.3 (H) 04/24/2024   PLT 263 04/24/2024   BMET    Component Value Date/Time   NA 135 04/24/2024 0613   K 4.4 04/24/2024 0613   CL 98 04/24/2024 0613   CO2 25 04/24/2024 0613   GLUCOSE 208 (H) 04/24/2024 0613   BUN 15 04/24/2024 0613   CREATININE 0.70 04/24/2024 0613   CREATININE 0.75 12/11/2023 1135   CALCIUM 9.7 04/24/2024 0613   EGFR 101 12/11/2023 1135   GFRNONAA >60 04/24/2024 9386    Assessment/Plan: 4 Days Post-Op   Principal Problem:   Rupture of  quadriceps tendon, left, initial encounter Active Problems:   Hyperlipidemia associated with type 2 diabetes mellitus (HCC)   HYPERTENSION, BENIGN ESSENTIAL   Obesity (BMI 30-39.9)   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)   Uncontrolled type 2 diabetes mellitus with hyperglycemia, with long-term current use of insulin  (HCC)   PVD (peripheral vascular disease)   Shoulder dislocation, right, initial encounter   Right knee pain   Postop day 4 status post open repair of left quadriceps, nonoperative care of right quadriceps tendon tear, nonoperative care of right shoulder dislocation with rotator cuff tear  Patient aware he needs to continue knee immobilizer use on both legs as well as sling on right shoulder. Again spoke to him about this today.    He should keep the knee immobilizers in place in both legs.  He may weight-bear as tolerated in the knee immobilizers. Continue working with PT.  We will continue to be nonweightbearing in the right shoulder with a sling and swath.  SNF vs. CIR, will reach out to PT to see if there is anyone who can work with him today. Patient did not have PT yesterday.  Arm[employer] MARLA Daring 04/25/2024, 9:36 AM   Contact information:   [id number] 7am-5pm epic message Dr. Reyne, or call office for patient follow up: [phone] After hours and holidays please check [email or web address] for group call information for [facility]

## 2024-04-25 NOTE — Assessment & Plan Note (Addendum)
 In addition to the traumatic injuries as above, he reported R knee pain occurring from prior fall R knee MRI also shows quadriceps tendon rupture, although this one is not complete Orthopedics recommends WBAT with knee immobilizer and PT, conservative treatment at this time

## 2024-04-25 NOTE — Assessment & Plan Note (Addendum)
 Mechanical fall x 2 MRI with ruptured R quadriceps tendon at distal patellar attachment Pain control as needed Underwent surgical repair on 04/21/2024 Fall precautions WBAT Given diffuse orthopedic injuries, he appears likely to benefit from CIR vs. 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

## 2024-04-25 NOTE — Plan of Care (Signed)
   Problem: Coping: Goal: Ability to adjust to condition or change in health will improve Outcome: Progressing   Problem: Coping: Goal: Level of anxiety will decrease Outcome: Progressing   Problem: Pain Managment: Goal: General experience of comfort will improve and/or be controlled Outcome: Progressing   Problem: Safety: Goal: Ability to remain free from injury will improve Outcome: Progressing

## 2024-04-25 NOTE — Assessment & Plan Note (Signed)
 BP 140/84 currently Resume spironolactone 

## 2024-04-25 NOTE — Assessment & Plan Note (Addendum)
 Body mass index is 33.05 kg/m.[name]  Weight loss should be encouraged on an ongoing basis Resume semaglutide  after discharge Outpatient PCP/bariatric medicine f/u encouraged Significantly low or high BMI is associated with higher medical risk including morbidity and mortality

## 2024-04-25 NOTE — Assessment & Plan Note (Signed)
 Resume Plavix  12/6 Continue ASA

## 2024-04-25 NOTE — Plan of Care (Signed)
  Problem: Coping: Goal: Ability to adjust to condition or change in health will improve Outcome: Not Progressing   Problem: Nutrition: Goal: Adequate nutrition will be maintained Outcome: Not Progressing   Problem: Elimination: Goal: Will not experience complications related to bowel motility Outcome: Not Progressing   Problem: Pain Managment: Goal: General experience of comfort will improve and/or be controlled Outcome: Not Progressing

## 2024-04-25 NOTE — Assessment & Plan Note (Signed)
 Also with R shoulder injury MRI with bony Bankart injury with labral defect and large Hill-Sachs impaction of the posterolateral humeral head as well as full-thickness tears of supraspinatus and infraspinatus tendons This also appears to need surgical intervention, but will defer to orthopedics as an outpatient For now, nonweightbearing with R shoulder sling and swath Of note, he previously has a LEFT shoulder MRI (10/23025), also with full-thickness tears of the supraspinatus and infraspinatus tendons; this was reviewed by Dr. Dozier on 12/1 and is planned for conservative treatment with PT and steroid injection (subacromial injection given) for now and need for shoulder replacement in the future if unsuccessful

## 2024-04-26 ENCOUNTER — Ambulatory Visit: Admitting: "Endocrinology

## 2024-04-26 DIAGNOSIS — S76112A Strain of left quadriceps muscle, fascia and tendon, initial encounter: Secondary | ICD-10-CM | POA: Diagnosis not present

## 2024-04-26 LAB — BASIC METABOLIC PANEL WITH GFR
Anion gap: 13 (ref 5–15)
BUN: 17 mg/dL (ref 8–23)
CO2: 22 mmol/L (ref 22–32)
Calcium: 9.9 mg/dL (ref 8.9–10.3)
Chloride: 100 mmol/L (ref 98–111)
Creatinine, Ser: 0.67 mg/dL (ref 0.61–1.24)
GFR, Estimated: >60 mL/min (ref >60)
Glucose, Bld: 181 mg/dL — ABNORMAL HIGH (ref 70–99)
Potassium: 4.7 mmol/L (ref 3.5–5.1)
Sodium: 134 mmol/L — ABNORMAL LOW (ref 135–145)

## 2024-04-26 LAB — GLUCOSE, CAPILLARY
Glucose-Capillary: 210 mg/dL — ABNORMAL HIGH (ref 70–99)
Glucose-Capillary: 261 mg/dL — ABNORMAL HIGH (ref 70–99)
Glucose-Capillary: 170 mg/dL — ABNORMAL HIGH (ref 70–99)
Glucose-Capillary: 172 mg/dL — ABNORMAL HIGH (ref 70–99)

## 2024-04-26 LAB — CBC
HCT: 42.8 % (ref 39.0–52.0)
Hemoglobin: 14.1 g/dL (ref 13.0–17.0)
MCH: 34.4 pg — ABNORMAL HIGH (ref 26.0–34.0)
MCHC: 32.9 g/dL (ref 30.0–36.0)
MCV: 104.4 fL — ABNORMAL HIGH (ref 80.0–100.0)
Platelets: 302 K/uL (ref 150–400)
RBC: 4.1 MIL/uL — ABNORMAL LOW (ref 4.22–5.81)
RDW: 13.5 % (ref 11.5–15.5)
WBC: 8.8 K/uL (ref 4.0–10.5)
nRBC: 0 % (ref 0.0–0.2)

## 2024-04-26 NOTE — Assessment & Plan Note (Addendum)
 Also with R shoulder injury MRI with bony Bankart injury with labral defect and large Hill-Sachs impaction of the posterolateral humeral head as well as full-thickness tears of supraspinatus and infraspinatus tendons This also appears to need surgical intervention, but will defer to orthopedics as an outpatient For now, nonweightbearing with R shoulder sling and swath; he is fairly noncompliant with the sling and it is usually out of place when I go to see him Of note, he previously has a LEFT shoulder MRI (10/23025), also with full-thickness tears of the supraspinatus and infraspinatus tendons; this was reviewed by Dr. Dozier on 12/1 and is planned for conservative treatment with PT and steroid injection (subacromial injection given) for now and need for shoulder replacement in the future if unsuccessful

## 2024-04-26 NOTE — Progress Notes (Addendum)
 Physical Therapy Treatment Patient Details Name: Zachary Lewis MRN: [id number] DOB: 1961/03/13 Today's Date: 04/26/2024   History of Present Illness Pt is a 63 y/o M admitted on 04/20/24 after presenting with c/o a fall. Pt found to have R shoulder dislocation that was reduced int he ER. Pt also found to have LLE quad tendon rupture s/p surgical repair 04/21/24. Pt with c/o chronic R knee pain 2/2 a fall & found to have incomplete quad tendon rupture. PMH: DM2, HTN, HLD, PAD, NASH cirrhosis with ascites, obesity    PT Comments  POD # 5    Per chart review, Pt has been removing his immobilizers and sling was not in place.  Re educated Pt importance of complying with orders for KI all times.  Pt c/o that it's cutting into back and not comfortable.  Currently B KI's are on but both have slide down legs not fully supporting knees and allowing some knee flexion.  With caution to avoid any knee flexion, removed both KI's.  Performed a skin check.  All areas, skin is intact.  Applied a folded towel between KI and Pt's skin for increased comfort, pressure relief  and hygiene as Pt c/o they make me sweat.  Re applied KI's and Educated on proper application/location of KI's as well as strap placement/tightness.  That feels better.    Performed few B LE TE's while supine in bed listed below.  Assisted to EOB was difficult.  General bed mobility comments: applied a leg strap to each foot and educated on use to assist sliding B LE off side then off bed.  Required increased time and effort.  Also required assist with upper body due to only use of one UE as R UE is in a sling (NWB).   A plastic bag was placed between bed pad and bottom sheet to also assist with scooting/pivoting.  Once upright, Pt was able to static sit EOB.  Assisted with repositioning R UE sling.  Assisted with standing required + 2 Max Assist.  General transfer comment: Assisted from seated EOB to standing from ELEVATED bed B  KI's applied and + 2 Max Assist to stand.  Pt required + 3 attempts and assist with trunk posterior to anterior weight shift.  Pt was able to static stand but required assist for foot placement/adjustment to correct posterior lean.  Pt used walker for support L UE and Therapist support RIGHT side walker (R UE sling).  After amb a short distance, required + 2 side by side to lower down to seated recliner due to B LE in full extension wearing B KI's.  .  General Gait Details: Pt was able to take a few steps with + 2 side by side assist using walker for L UE but NOT on R UE (sling).  Pt had great difficulty weightshifting onto his L LE due to increased pain.  Pt had difficulty advancing R LE due to increased pain with weight shifting onto LE.  Pt used several small, shuffled steps to amb 12 inches from the bed then complete 1/4 turn to back up to recliner.  Distance limited by L knee pain with weightbearing and amount of effort needed to advance either LE.  Positioned in recliner to comfort.  Prior to his fall, Pt was IND/Driving/Working when he tripped on the curb outside his work.  LPT has rec Pt will need ST Rehab at SNF to address mobility and functional decline prior to safely returning home.  If plan is discharge home, recommend the following: Two people to help with walking and/or transfers;Two people to help with bathing/dressing/bathroom;Assist for transportation;Assistance with cooking/housework;Help with stairs or ramp for entrance   Can travel by private vehicle     No  Equipment Recommendations  Rolling walker (2 wheels);BSC/3in1;Hospital bed;Wheelchair (measurements PT);Hoyer lift;Wheelchair cushion (measurements PT)    Recommendations for Other Services       Precautions / Restrictions Precautions Precautions: Fall Precaution/Restrictions Comments: No ROM B knees Required Braces or Orthoses: Knee Immobilizer - Left;Knee Immobilizer - Right;Sling;Other Brace Knee Immobilizer -  Right: On at all times Knee Immobilizer - Left: On at all times Other Brace: R UE sling on all time Restrictions Weight Bearing Restrictions Per Provider Order: Yes RUE Weight Bearing Per Provider Order: Non weight bearing RLE Weight Bearing Per Provider Order: Weight bearing as tolerated LLE Weight Bearing Per Provider Order: Weight bearing as tolerated     Mobility  Bed Mobility Overal bed mobility: Needs Assistance Bed Mobility: Supine to Sit     Supine to sit: Mod assist, +2 for safety/equipment     General bed mobility comments: applied a leg strap to each foot and educated on use to assist sliding B LE off side then off bed.  Required increased time and effort.  Also required assist with upper body due to only use of one UE as R UE is in a sling (NWB).   A plastic bag was placed between bed pad and bottom sheet to also assist with scooting/pivoting.    Transfers   Equipment used: Rolling walker (2 wheels)               General transfer comment: Assisted from seated EOB to standing from ELEVATED bed B KI's applied and + 2 Max Assist to stand.  Pt required + 3 attempts and assist with trunk posterior to anterior weight shift.  Pt was able to static stand but required assist for foot placement/adjustment to correct posterior lean.  Pt used walker for support L UE and Therapist support RIGHT side walker (R UE sling).  After amb a short distance, required + 2 side by side to lower down to seated recliner due to B LE in full extension wearing B KI's.  .    Ambulation/Gait Ambulation/Gait assistance: Max assist, +2 physical assistance, +2 safety/equipment Gait Distance (Feet): 2 Feet Assistive device: Rolling walker (2 wheels) Gait Pattern/deviations: Step-to pattern, Decreased step length - right, Decreased step length - left Gait velocity: decreased     General Gait Details: Pt was able to take a few steps with + 2 side by side assist using walker for L UE but NOT on  R UE (sling).  Pt had great difficulty weightshifting onto his L LE due to increased pain.  Pt had difficulty advancing R LE due to increased pain with weight shifting onto LE.  Pt used several small, shuffled steps to amb 12 inches from the bed then complete 1/4 turn to back up to recliner.  Distance limited by L knee pain with weightbearing and amount of effort needed to advance either LE.   Stairs             Wheelchair Mobility     Tilt Bed    Modified Rankin (Stroke Patients Only)       Balance  Communication    Cognition Arousal: Alert Behavior During Therapy: WFL for tasks assessed/performed   PT - Cognitive impairments: No apparent impairments                       PT - Cognition Comments: AxO x 3 pleasant and willing but also nervous. Following commands: Intact Following commands impaired: Follows multi-step commands inconsistently, Follows multi-step commands with increased time    Cueing Cueing Techniques: Verbal cues  Exercises  While in bed with B KI's secured performed  B LE AP x 10 reps B LE knee presses x 10 reps  B LE ABd/ADd x 10 reps using belt B LE SLR x 10 reps using belt   General Comments        Pertinent Vitals/Pain Pain Assessment Pain Assessment: 0-10 Pain Score: 7  Pain Location: L knee with amb 7/10, R knee no pain and R shoulder no pain Pain Descriptors / Indicators: Discomfort, Grimacing, Guarding, Operative site guarding Pain Intervention(s): Premedicated before session, Repositioned, Monitored during session, Ice applied    Home Living                          Prior Function            PT Goals (current goals can now be found in the care plan section) Progress towards PT goals: Progressing toward goals    Frequency    Min 3X/week      PT Plan      Co-evaluation              AM-PAC PT 6 Clicks Mobility   Outcome Measure   Help needed turning from your back to your side while in a flat bed without using bedrails?: A Lot Help needed moving from lying on your back to sitting on the side of a flat bed without using bedrails?: A Lot Help needed moving to and from a bed to a chair (including a wheelchair)?: A Lot Help needed standing up from a chair using your arms (e.g., wheelchair or bedside chair)?: A Lot Help needed to walk in hospital room?: A Lot Help needed climbing 3-5 steps with a railing? : Total 6 Click Score: 11    End of Session Equipment Utilized During Treatment: Gait belt;Right knee immobilizer Activity Tolerance: Patient limited by pain Patient left: in chair;with call bell/phone within reach;with chair alarm set Nurse Communication: Mobility status;Weight bearing status;Precautions PT Visit Diagnosis: Muscle weakness (generalized) (M62.81);Pain;Other abnormalities of gait and mobility (R26.89);Difficulty in walking, not elsewhere classified (R26.2);History of falling (Z91.81) Pain - Right/Left: Left Pain - part of body: Knee     Time: 8955-8881 PT Time Calculation (min) (ACUTE ONLY): 34 min  Charges:    $Gait Training: 8-22 mins $Therapeutic Activity: 8-22 mins PT General Charges $$ ACUTE PT VISIT: 1 Visit                     Katheryn Leap  PTA Acute  Rehabilitation Services Office M-F          [phone]

## 2024-04-26 NOTE — Assessment & Plan Note (Signed)
 Strict ETOH cessation is encouraged No current concerns in this regard Check LFTs in AM

## 2024-04-26 NOTE — Progress Notes (Signed)
 Progress Note   Patient: Zachary Lewis FMW:[id number] DOB: 1960-12-05 DOA: 04/20/2024     6 DOS: the patient was seen and examined on 04/26/2024   Brief hospital course: 63yo with h/o T2DM, HTN, HLD, PAD, NASH cirrhosis with ascites, and class 1 obesity who presented on 12/2 with a fall.  R shoulder xray revealed anterior and inferior dislocation of humeral head with likely Hill-Sachs impact fracture; this was reduced in the ER and orthopedics is ordering an MRI for further evaluation.  Also with L quadriceps tendon rupture, underwent repair on 12/3.   Assessment & Plan Rupture of quadriceps tendon, left, initial encounter Mechanical fall x 2 MRI with ruptured R quadriceps tendon at distal patellar attachment Pain control as needed Underwent surgical repair on 04/21/2024 Fall precautions WBAT with knee immobilizer; this is to remain in place constantly and has been on consistently Given diffuse orthopedic injuries, he appears likely to benefit from CIR vs. 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 - prefers [facility] or [facility] (also listed [facility] and [facility], although these beds are more difficult to obtain for patients not in those communities); [facility] is aware  Shoulder dislocation, right, initial encounter Also with R shoulder injury MRI with bony Bankart injury with labral defect and large Hill-Sachs impaction of the posterolateral humeral head as well as full-thickness tears of supraspinatus and infraspinatus tendons This also appears to need surgical intervention, but will defer to orthopedics as an outpatient For now, nonweightbearing with R shoulder sling and swath; he is fairly noncompliant with the sling and it is usually out of place when I go to see him Of note, he previously has a LEFT shoulder MRI (10/23025), also with full-thickness tears of the supraspinatus and infraspinatus tendons; this was reviewed by  Dr. Dozier on 12/1 and is planned for conservative treatment with PT and steroid injection (subacromial injection given) for now and need for shoulder replacement in the future if unsuccessful Right knee pain In addition to the traumatic injuries as above, he reported R knee pain occurring from prior fall R knee MRI also shows quadriceps tendon rupture, although this one is not complete Orthopedics recommends WBAT with knee immobilizer and PT, conservative treatment at this time He removed the knee immobilizer overnight because it was hot; he understands that this is suboptimal and not recommended Uncontrolled type 2 diabetes mellitus with hyperglycemia, with long-term current use of insulin  (HCC) Last A1c was 8.6, poor control Hold semaglutide  Continue glargine (40 units PTA), currently 30 units BID Cover with moderate-scale SSI Carb modified diet resumed, appears to have been changed post-operatively PVD (peripheral vascular disease) Resume Plavix  12/6 Continue ASA  HYPERTENSION, BENIGN ESSENTIAL BP 140/84 currently Resume spironolactone  Hyperlipidemia associated with type 2 diabetes mellitus (HCC) No longer taking Zetia  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) Strict ETOH cessation is encouraged No current concerns in this regard Check LFTs in AM Obesity (BMI 30-39.9) Body mass index is 33.05 kg/m.[name]  Weight loss should be encouraged on an ongoing basis Resume semaglutide  after discharge Outpatient PCP/bariatric medicine f/u encouraged Significantly low or high BMI is associated with higher medical risk including morbidity and mortality        Consultants: Orthopedics PT OT   Procedures: Quadriceps tendon repair 12/3   Antibiotics: None  30 Day Unplanned Readmission Risk Score    Flowsheet Row ED to Hosp-Admission (Current) from 04/20/2024 in [place]  30  Day Unplanned Readmission Risk Score (%) 13.19 Filed at 04/26/2024  0400    This score is the patient's risk of an unplanned readmission within 30 days of being discharged (0 -100%). The score is based on dignosis, age, lab data, medications, orders, and past utilization.   Low:  0-14.9   Medium: 15-21.9   High: 22-29.9   Extreme: 30 and above           Subjective: Removed splint and R knee immobilizer overnight because they were hot. Discussed importance of compliance with recommended devices.   Objective: Vitals:   04/26/24 0542 04/26/24 1359  BP: 122/83 (!) 120/92  Pulse: 85 (!) 102  Resp: 17 18  Temp: 98.1 F (36.7 C) 97.8 F (36.6 C)  SpO2: 99% 100%    Intake/Output Summary (Last 24 hours) at 04/26/2024 1427 Last data filed at 04/26/2024 0800 Gross per 24 hour  Intake 480 ml  Output 950 ml  Net -470 ml   Filed Weights   04/20/24 1751  Weight: 95.7 kg    Exam:  General:  Appears calm and comfortable and is in NAD Eyes:  normal lids, iris ENT:  grossly normal hearing, lips & tongue, mmm Cardiovascular:  RRR. No LE edema.  Respiratory:   CTA bilaterally with no wheezes/rales/rhonchi.  Normal respiratory effort. Abdomen:  soft, NT, ND Skin:  no rash or induration seen on limited exam Musculoskeletal:  Shoulder sling was out of place, L knee immobilizer was at bedside, R knee immobilizer was in place Psychiatric: blunted mood and affect, speech fluent and appropriate, AOx3 Neurologic:  CN 2-12 grossly intact, moves all extremities in coordinated fashion  Data Reviewed: I have reviewed the patient's lab results since admission.  Pertinent labs for today include:   Na++ 134, not clinically significant Glucose 181 Stable CBC     Family Communication: None present  Mobility: PT/OT Consulted and are recommending - Skilled Nursing-Short Term Rehab (<3 Hours/Day)04/26/2024 1416    Code Status: Full Code  Barriers to discharge:  Workers Youth Worker - awaiting investigation  Disposition: Status is: Inpatient Remains  inpatient appropriate because: awaiting placement     Time spent: 50 minutes  Unresulted Labs (From admission, onward)    None        Author: Delon Herald, MD 04/26/2024 2:27 PM  For on call review [email or web address].

## 2024-04-26 NOTE — Progress Notes (Signed)
 Dressing changed to left knee, staples intact, well-approximated, scant serous drainage, 4x4 gauze placed, 2 square mepilex foam dressings placed over gauze, KI was placed back on, also able to place KI on right leg and replace sling to right arm.

## 2024-04-26 NOTE — Assessment & Plan Note (Addendum)
 Last A1c was 8.6, poor control Hold semaglutide  Continue glargine (40 units PTA), currently 30 units BID Cover with moderate-scale SSI Carb modified diet resumed, appears to have been changed post-operatively

## 2024-04-26 NOTE — Assessment & Plan Note (Signed)
 No longer taking Zetia 

## 2024-04-26 NOTE — Assessment & Plan Note (Signed)
 Resume Plavix  12/6 Continue ASA

## 2024-04-26 NOTE — TOC Progression Note (Signed)
 Transition of Care [facility]) - Progression Note    Patient Details  Name: Zachary Lewis MRN: [id number] Date of Birth: 1961-02-29  [facility]) CM/SW Contact  Alfonse JONELLE Rex, RN Phone Number: 04/26/2024, 9:37 AM  Clinical Narrative:   Call to Workers Comp CM CM Ethelene Birmingham [phone]), introduce self and role of INPT CM, reviewed PT recommendation  for short term rehab/SNF, Ethelene states claim is still under investigation, states she will discuss with her supervisor. NCM name and phone provided for contact/update.     Expected Discharge Plan: Home/Self Care Barriers to Discharge: Continued Medical Work up               Expected Discharge Plan and Services   Discharge Planning Services: CM Consult   Living arrangements for the past 2 months: Single Family Home                 DME Arranged: N/A DME Agency: NA       HH Arranged: NA HH Agency: NA         Social Drivers of Health (SDOH) Interventions SDOH Screenings   Food Insecurity: No Food Insecurity (04/22/2024)  Housing: Low Risk  (04/22/2024)  Transportation Needs: No Transportation Needs (04/22/2024)  Utilities: Not At Risk (04/22/2024)  Depression (PHQ2-9): Low Risk  (06/02/2023)  Recent Concern: Depression (PHQ2-9) - Medium Risk (05/12/2023)  Tobacco Use: Medium Risk (04/20/2024)    Readmission Risk Interventions    04/14/2023    5:27 PM  Readmission Risk Prevention Plan  Post Dischage Appt Complete  Medication Screening Complete  Transportation Screening Complete

## 2024-04-26 NOTE — Progress Notes (Signed)
 Physical Therapy Treatment Patient Details Name: Zachary Lewis MRN: [id number] DOB: 19-Sep-1960 Today's Date: 04/26/2024   History of Present Illness Pt is a 63 y/o M admitted on 04/20/24 after presenting with c/o a fall. Pt found to have R shoulder dislocation that was reduced int he ER. Pt also found to have LLE quad tendon rupture s/p surgical repair 04/21/24. Pt with c/o chronic R knee pain 2/2 a fall & found to have incomplete quad tendon rupture. PMH: DM2, HTN, HLD, PAD, NASH cirrhosis with ascites, obesity    PT Comments  POD # 5 Pt tolerated OOB in recliner from 11:00 am till 5:25 pm Pt holding his B leg straps and stated, look, I have been doing these.  ABd/ADd and SLR's.   Pt was unable to rise from lower recliner chair.  So assisted back to bed via stretch slide transfer from drop arm recliner and + 3 assist.  Positioned in bed to comfort.     If plan is discharge home, recommend the following: Two people to help with walking and/or transfers;Two people to help with bathing/dressing/bathroom;Assist for transportation;Assistance with cooking/housework;Help with stairs or ramp for entrance   Can travel by private vehicle     No  Equipment Recommendations  Rolling walker (2 wheels);BSC/3in1;Hospital bed;Wheelchair (measurements PT);Hoyer lift;Wheelchair cushion (measurements PT)    Recommendations for Other Services       Precautions / Restrictions Precautions Precautions: Fall Precaution/Restrictions Comments: No ROM B knees Required Braces or Orthoses: Knee Immobilizer - Left;Knee Immobilizer - Right;Sling;Other Brace Knee Immobilizer - Right: On at all times Knee Immobilizer - Left: On at all times Other Brace: R UE sling on all time Restrictions Weight Bearing Restrictions Per Provider Order: Yes RUE Weight Bearing Per Provider Order: Non weight bearing RLE Weight Bearing Per Provider Order: Weight bearing as tolerated LLE Weight Bearing Per Provider Order: Weight  bearing as tolerated Other Position/Activity Restrictions: WBAT BLEs with KI's           Communication Communication Communication: No apparent difficulties  Cognition Arousal: Alert Behavior During Therapy: WFL for tasks assessed/performed   PT - Cognitive impairments: No apparent impairments                       PT - Cognition Comments: AxO x 3 pleasant and willing.  Spouse present during session. Following commands: Intact Following commands impaired: Follows multi-step commands inconsistently, Follows multi-step commands with increased time    Cueing Cueing Techniques: Verbal cues  Exercises      General Comments        Pertinent Vitals/Pain Pain Assessment Pain Assessment: Faces Pain Score: 7  Faces Pain Scale: Hurts even more Pain Location: L knee Pain Descriptors / Indicators: Constant, Discomfort, Operative site guarding Pain Intervention(s): Monitored during session, Premedicated before session, Repositioned     AM-PAC PT 6 Clicks Mobility   Outcome Measure  Help needed turning from your back to your side while in a flat bed without using bedrails?: A Lot Help needed moving from lying on your back to sitting on the side of a flat bed without using bedrails?: A Lot Help needed moving to and from a bed to a chair (including a wheelchair)?: A Lot Help needed standing up from a chair using your arms (e.g., wheelchair or bedside chair)?: A Lot Help needed to walk in hospital room?: A Lot Help needed climbing 3-5 steps with a railing? : Total 6 Click Score: 11    End of  Session Equipment Utilized During Treatment: Gait belt;Right knee immobilizer Activity Tolerance: Patient limited by pain Patient left: in bed;with call bell/phone within reach;with bed alarm set;with family/visitor present Nurse Communication: Mobility status;Weight bearing status;Precautions PT Visit Diagnosis: Muscle weakness (generalized) (M62.81);Pain;Other abnormalities of gait  and mobility (R26.89);Difficulty in walking, not elsewhere classified (R26.2);History of falling (Z91.81) Pain - Right/Left: Left Pain - part of body: Knee     Time: 8276-8258 PT Time Calculation (min) (ACUTE ONLY): 18 min  Charges:    $Therapeutic Activity: 8-22 mins PT General Charges $$ ACUTE PT VISIT: 1 Visit                     Katheryn Leap  PTA Acute  Rehabilitation Services Office M-F          [phone]

## 2024-04-26 NOTE — Assessment & Plan Note (Addendum)
 Mechanical fall x 2 MRI with ruptured R quadriceps tendon at distal patellar attachment Pain control as needed Underwent surgical repair on 04/21/2024 Fall precautions WBAT with knee immobilizer; this is to remain in place constantly and has been on consistently Given diffuse orthopedic injuries, he appears likely to benefit from CIR vs. 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 - prefers [facility] or [facility] (also listed [facility] and [facility], although these beds are more difficult to obtain for patients not in those communities); [facility] is aware

## 2024-04-26 NOTE — Assessment & Plan Note (Signed)
 BP 140/84 currently Resume spironolactone 

## 2024-04-26 NOTE — NC FL2 (Signed)
 [place]  MEDICAID FL2 LEVEL OF CARE FORM     IDENTIFICATION  Patient Name: Zachary Lewis Birthdate: [birth year] Sex: male Admission Date (Current Location): 04/20/2024  [facility] and [place] Number:  Producer, Television/film/video and Address:  [facility],  [address] [place], [place] [address]      Provider Number: [id number]  Attending Physician Name and Address:  Barbarann Nest, MD  Relative Name and Phone Number:  Graylin, Sperling (Spouse)  [phone] [facility])    Current Level of Care: Hospital Recommended Level of Care: Skilled Nursing Facility Prior Approval Number:    Date Approved/Denied:   PASRR Number: [id number]  Discharge Plan: SNF    Current Diagnoses: Patient Active Problem List   Diagnosis Date Noted   Shoulder dislocation, right, initial encounter 04/21/2024   Right knee pain 04/21/2024   Rupture of quadriceps tendon, left, initial encounter 04/20/2024   Low T4 06/02/2023   Cognitive change 05/12/2023   Hearing loss 05/12/2023   Limb ischemia 04/18/2023   Diabetic foot infection (HCC) 04/12/2023   Onychomycosis 04/12/2023   Uncontrolled type 2 diabetes mellitus with hyperglycemia, with long-term current use of insulin  (HCC) 04/12/2023   PVD (peripheral vascular disease) 04/12/2023   Angiodysplasia of cecum 11/25/2022   Routine general medical examination at a health care facility 02/10/2022   Elevated TSH 11/07/2020   Erectile dysfunction 01/07/2020   Portal hypertensive gastropathy (HCC) 06/24/2019   Low back pain 12/29/2018   NSAID-induced gastric ulcer 12/13/2018   Pain due to onychomycosis of toenails of both feet 12/01/2018   Pre-ulcerative calluses 12/01/2018   Porokeratosis 12/01/2018   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) 08/04/2018   Hypokalemia 06/24/2018   History of alcoholic hepatitis    Alcoholic liver disease    Former smoker 01/04/2014   Hx of adenomatous colonic polyps 03/09/2013   Prostate cancer  screening 12/07/2012   Encounter for general adult medical examination with abnormal findings 08/23/2010   Hyperlipidemia    Obesity (BMI 30-39.9)    HYPERTENSION, BENIGN ESSENTIAL 01/05/2010   BACK PAIN, LUMBAR, WITH RADICULOPATHY 09/13/2008   TINEA PEDIS 07/16/2007   DM (diabetes mellitus), type 2 (HCC) 08/13/2006   Hyperlipidemia associated with type 2 diabetes mellitus (HCC) 08/13/2006   Allergy 08/13/2006   Fatty liver 08/13/2006    Orientation RESPIRATION BLADDER Height & Weight     Self, Time, Situation, Place  Normal Continent Weight: 95.7 kg Height:  5' 7 (170.2 cm)  BEHAVIORAL SYMPTOMS/MOOD NEUROLOGICAL BOWEL NUTRITION STATUS      Continent Diet (carb modified)  AMBULATORY STATUS COMMUNICATION OF NEEDS Skin   Limited Assist Verbally Surgical wounds (L knee)                       Personal Care Assistance Level of Assistance  Bathing, Feeding, Dressing Bathing Assistance: Limited assistance Feeding assistance: Limited assistance Dressing Assistance: Limited assistance     Functional Limitations Info  Sight, Hearing, Speech Sight Info: Adequate Hearing Info: Adequate Speech Info: Adequate    SPECIAL CARE FACTORS FREQUENCY  PT (By licensed PT), OT (By licensed OT)     PT Frequency: 5x/wk OT Frequency: 5c/wk            Contractures Contractures Info: Not present    Additional Factors Info  Code Status, Allergies, Psychotropic Code Status Info: Full Code Allergies Info: NKA Psychotropic Info: N/A         Current Medications (04/26/2024):  This is the  current hospital active medication list Current Facility-Administered Medications  Medication Dose Route Frequency Provider Last Rate Last Admin   acetaminophen  (TYLENOL ) tablet 500 mg  500 mg Oral Q6H PRN Hall, Carole N, DO   500 mg at 04/25/24 0538   aspirin  EC tablet 81 mg  81 mg Oral Daily Barbarann Nest, MD   81 mg at 04/26/24 0801   bisacodyl  (DULCOLAX) EC tablet 5 mg  5 mg Oral Daily PRN  Gebauer, Gregory P, MD   5 mg at 04/26/24 0800   clopidogrel  (PLAVIX ) tablet 75 mg  75 mg Oral Daily Barbarann Nest, MD   75 mg at 04/26/24 0802   cyclobenzaprine  (FLEXERIL ) tablet 10 mg  10 mg Oral TID PRN Reyne Cordella SQUIBB, MD   10 mg at 04/25/24 2215   docusate sodium  (COLACE) capsule 100 mg  100 mg Oral BID Reyne Cordella SQUIBB, MD   100 mg at 04/26/24 0801   enoxaparin  (LOVENOX ) injection 40 mg  40 mg Subcutaneous Q24H Barbarann Nest, MD   40 mg at 04/25/24 1726   insulin  aspart (novoLOG ) injection 0-20 Units  0-20 Units Subcutaneous TID WC Shona Terry SAILOR, DO   11 Units at 04/26/24 1207   insulin  aspart (novoLOG ) injection 0-5 Units  0-5 Units Subcutaneous QHS Shona Terry N, DO   2 Units at 04/23/24 2157   insulin  glargine (LANTUS ) injection 30 Units  30 Units Subcutaneous BID Barbarann Nest, MD   30 Units at 04/26/24 1005   lip balm (CARMEX) ointment   Topical PRN Chavez, Abigail, NP       melatonin tablet 5 mg  5 mg Oral QHS PRN Shona Terry N, DO   5 mg at 04/25/24 2215   Muscle Rub CREA   Topical PRN Barbarann Nest, MD       ondansetron  (ZOFRAN ) tablet 4 mg  4 mg Oral Q6H PRN Reyne Cordella SQUIBB, MD       Or   ondansetron  (ZOFRAN ) injection 4 mg  4 mg Intravenous Q6H PRN Reyne Cordella SQUIBB, MD       oxyCODONE  (Oxy IR/ROXICODONE ) immediate release tablet 5 mg  5 mg Oral Q4H PRN Barbarann Nest, MD   5 mg at 04/26/24 1205   polyethylene glycol (MIRALAX  / GLYCOLAX ) packet 17 g  17 g Oral Daily PRN Shona Terry N, DO   17 g at 04/25/24 1537   prochlorperazine  (COMPAZINE ) injection 5 mg  5 mg Intravenous Q6H PRN Hall, Carole N, DO       sodium phosphate (FLEET) enema 1 enema  1 enema Rectal Once PRN Reyne Cordella SQUIBB, MD       spironolactone  (ALDACTONE ) tablet 50 mg  50 mg Oral Daily Barbarann Nest, MD   50 mg at 04/26/24 1003     Discharge Medications: Please see discharge summary for a list of discharge medications.  Relevant Imaging Results:  Relevant Lab  Results:   Additional Information SSN: [id number]  Alfonse JONELLE Rex, RN

## 2024-04-26 NOTE — Plan of Care (Signed)

## 2024-04-26 NOTE — Progress Notes (Signed)
 Patient with KI to left leg but removed from right leg and does not want to wear at night. Expressed to patient that he could do further damage if he does not wear the KI on the right leg as well. Patient still opting to keep off at this time. Patient has also removed his arm from the sling and does not want to wear it while he sleeps. Encouraged patient to let us  reapply the sling but he declined. Patient did allow pillow to be placed under right arm.

## 2024-04-26 NOTE — Assessment & Plan Note (Addendum)
 In addition to the traumatic injuries as above, he reported R knee pain occurring from prior fall R knee MRI also shows quadriceps tendon rupture, although this one is not complete Orthopedics recommends WBAT with knee immobilizer and PT, conservative treatment at this time He removed the knee immobilizer overnight because it was hot; he understands that this is suboptimal and not recommended

## 2024-04-26 NOTE — Assessment & Plan Note (Addendum)
 Body mass index is 33.05 kg/m.[name]  Weight loss should be encouraged on an ongoing basis Resume semaglutide  after discharge Outpatient PCP/bariatric medicine f/u encouraged Significantly low or high BMI is associated with higher medical risk including morbidity and mortality

## 2024-04-27 LAB — GLUCOSE, CAPILLARY
Glucose-Capillary: 176 mg/dL — ABNORMAL HIGH (ref 70–99)
Glucose-Capillary: 225 mg/dL — ABNORMAL HIGH (ref 70–99)
Glucose-Capillary: 118 mg/dL — ABNORMAL HIGH (ref 70–99)
Glucose-Capillary: 235 mg/dL — ABNORMAL HIGH (ref 70–99)

## 2024-04-27 NOTE — Assessment & Plan Note (Signed)
 Strict ETOH cessation is encouraged No current concerns in this regard Check LFTs in AM

## 2024-04-27 NOTE — Progress Notes (Signed)
 Occupational Therapy Treatment Patient Details Name: Zachary Lewis MRN: [id number] DOB: 1960-12-05 Today's Date: 04/27/2024   History of present illness Pt is a 63 y/o M admitted on 04/20/24 after presenting with c/o a fall. Pt found to have R shoulder dislocation that was reduced int he ER. Pt also found to have LLE quad tendon rupture s/p surgical repair 04/21/24. Pt with c/o chronic R knee pain 2/2 a fall & found to have incomplete quad tendon rupture. PMH: DM2, HTN, HLD, PAD, NASH cirrhosis with ascites, obesity   OT comments  Pt at this time noted to have LLE KI towards ankle and sling at elbow. At this time carefully readjusted in proper position prior to attempting to mobilize pt. He initially was able to use leg straps to assist with bringing BLE to EOB but then required mod x2 to bring hips to EOB using pad. Then required CGA-min assist while sitting as would start to posterior lean. Attempted with bed elevated  sit to stand transfer with max-total x2 assist x3 attempts but was unable to get into a standing position. Pt then required max x2 to go from sitting to supine. Patient will benefit from continued inpatient follow up therapy, <3 hours/day.      If plan is discharge home, recommend the following:  A lot of help with walking and/or transfers;A lot of help with bathing/dressing/bathroom;Assistance with cooking/housework;Assist for transportation;Help with stairs or ramp for entrance   Equipment Recommendations  Other (comment) (TBD)    Recommendations for Other Services      Precautions / Restrictions Precautions Precautions: Fall Recall of Precautions/Restrictions: Impaired Precaution/Restrictions Comments: No ROM B knees Required Braces or Orthoses: Knee Immobilizer - Left;Knee Immobilizer - Right;Sling;Other Brace Knee Immobilizer - Right: On at all times Knee Immobilizer - Left: On at all times Other Brace: R UE sling on all time Restrictions Weight Bearing  Restrictions Per Provider Order: Yes RUE Weight Bearing Per Provider Order: Non weight bearing RLE Weight Bearing Per Provider Order: Weight bearing as tolerated LLE Weight Bearing Per Provider Order: Weight bearing as tolerated Other Position/Activity Restrictions: WBAT BLEs with KI's       Mobility Bed Mobility Overal bed mobility: Needs Assistance Bed Mobility: Supine to Sit     Supine to sit: Mod assist, +2 for physical assistance, +2 for safety/equipment, HOB elevated, Used rails Sit to supine: Max assist, +2 for physical assistance, +2 for safety/equipment, Used rails   General bed mobility comments: pt intially was able to get BLE to EOB with leg straps  but then could not bring hips to EOB and then required mod x2 assist.    Transfers Overall transfer level: Needs assistance Equipment used: Rolling walker (2 wheels) Transfers: Sit to/from Stand Sit to Stand: Max assist, +2 physical assistance, +2 safety/equipment, From elevated surface           General transfer comment: Pt at this time attempted to complete sit to stand transfers with max x2 assist with bed elevated  with x3 attempts with RW infront of pt. Gave pt to hold a wash cloth for RUE as would still attempt to try to push/pull even within the sling.  Pt reported they are very fearful of completion at this time.     Balance Overall balance assessment: Needs assistance Sitting-balance support: Feet supported Sitting balance-Leahy Scale: Fair Sitting balance - Comments: often had posterior tilt and needed close CGA  ADL either performed or assessed with clinical judgement   ADL Overall ADL's : Needs assistance/impaired Eating/Feeding: Minimal assistance;Sitting Eating/Feeding Details (indicate cue type and reason): when cutting items and set up of materials Grooming: Wash/dry face;Set up;Sitting   Upper Body Bathing: Maximal assistance;Bed level   Lower  Body Bathing: Total assistance;Bed level   Upper Body Dressing : Maximal assistance;Bed level   Lower Body Dressing: Total assistance;Bed level       Toileting- Clothing Manipulation and Hygiene: Total assistance;Bed level              Extremity/Trunk Assessment Upper Extremity Assessment Upper Extremity Assessment: RUE deficits/detail;LUE deficits/detail RUE Deficits / Details: NWB with no ROM per ortho RUE Sensation: WNL RUE Coordination: decreased fine motor;decreased gross motor LUE Deficits / Details: hx of shoulder/rotator cuff injury LUE Sensation: WNL LUE Coordination: WNL   Lower Extremity Assessment Lower Extremity Assessment: Defer to PT evaluation        Vision       Perception     Praxis     Communication Communication Communication: No apparent difficulties   Cognition Arousal: Alert Behavior During Therapy: WFL for tasks assessed/performed Cognition: No apparent impairments                               Following commands: Intact Following commands impaired: Follows multi-step commands inconsistently, Follows multi-step commands with increased time      Cueing   Cueing Techniques: Verbal cues  Exercises Other Exercises Other Exercises: Adjusted RUE sling and LLE KI (at ankle)    Shoulder Instructions       General Comments      Pertinent Vitals/ Pain       Pain Assessment Pain Assessment: Faces Faces Pain Scale: Hurts even more Pain Location: L knee Pain Descriptors / Indicators: Operative site guarding, Grimacing, Guarding, Discomfort Pain Intervention(s): Limited activity within patient's tolerance, Monitored during session, Repositioned, Premedicated before session  Home Living                                          Prior Functioning/Environment              Frequency  Min 2X/week        Progress Toward Goals  OT Goals(current goals can now be found in the care plan section)   Progress towards OT goals: Progressing toward goals  Acute Rehab OT Goals Patient Stated Goal: to get trying OT Goal Formulation: With patient Time For Goal Achievement: 05/06/24 Potential to Achieve Goals: Good ADL Goals Pt Will Perform Grooming: with supervision Pt Will Perform Upper Body Bathing: with min assist Pt Will Perform Lower Body Bathing: with min assist;sitting/lateral leans Pt Will Perform Upper Body Dressing: with min assist;sitting Pt Will Perform Lower Body Dressing: with min assist;sitting/lateral leans Pt Will Transfer to Toilet: with mod assist;bedside commode;stand pivot transfer Additional ADL Goal #1: Pt will be able to voice precautions to be able to follow in session  Plan      Co-evaluation                 AM-PAC OT 6 Clicks Daily Activity     Outcome Measure   Help from another person eating meals?: None Help from another person taking care of personal grooming?: A Little Help from another person toileting, which includes  using toliet, bedpan, or urinal?: Total Help from another person bathing (including washing, rinsing, drying)?: A Lot Help from another person to put on and taking off regular upper body clothing?: A Lot Help from another person to put on and taking off regular lower body clothing?: Total 6 Click Score: 13    End of Session Equipment Utilized During Treatment: Gait belt  OT Visit Diagnosis: Unsteadiness on feet (R26.81);Other abnormalities of gait and mobility (R26.89);Repeated falls (R29.6);Muscle weakness (generalized) (M62.81);Pain Pain - Right/Left: Left Pain - part of body: Knee   Activity Tolerance Patient tolerated treatment well   Patient Left in bed;with call bell/phone within reach;with bed alarm set   Nurse Communication Mobility status        Time: 8961-8890 OT Time Calculation (min): 31 min  Charges: OT General Charges $OT Visit: 1 Visit OT Treatments $Self Care/Home Management : 23-37 mins  Warrick POUR OTR/L  Acute Rehab Services  [phone] office number   Warrick Berber 04/27/2024, 11:31 AM

## 2024-04-27 NOTE — Assessment & Plan Note (Signed)
 Body mass index is 33.05 kg/m.Zachary Lewis  Weight loss should be encouraged on an ongoing basis Resume semaglutide  after discharge Outpatient PCP/bariatric medicine f/u encouraged Significantly low or high BMI is associated with higher medical risk including morbidity and mortality

## 2024-04-27 NOTE — Plan of Care (Signed)

## 2024-04-27 NOTE — Assessment & Plan Note (Signed)
 Also with R shoulder injury MRI with bony Bankart injury with labral defect and large Hill-Sachs impaction of the posterolateral humeral head as well as full-thickness tears of supraspinatus and infraspinatus tendons This also appears to need surgical intervention, but will defer to orthopedics as an outpatient For now, nonweightbearing with R shoulder sling and swath; Zachary Lewis is fairly noncompliant with the sling and it is usually out of place when I go to see him Of note, Zachary Lewis previously has a LEFT shoulder MRI (10/23025), also with full-thickness tears of the supraspinatus and infraspinatus tendons; this was reviewed by Dr. Dozier on 12/1 and is planned for conservative treatment with PT and steroid injection (subacromial injection given) for now and need for shoulder replacement in the future if unsuccessful

## 2024-04-27 NOTE — Assessment & Plan Note (Signed)
 Last A1c was 8.6, poor control Hold semaglutide  Continue glargine (40 units PTA), currently 30 units BID Cover with moderate-scale SSI Carb modified diet resumed, appears to have been changed post-operatively

## 2024-04-27 NOTE — Progress Notes (Signed)
 Progress Note   Patient: Zachary Lewis FMW:[id number] DOB: 1960-08-31 DOA: 04/20/2024     7 DOS: the patient was seen and examined on 04/27/2024   Brief hospital course: 63yo with h/o T2DM, HTN, HLD, PAD, NASH cirrhosis with ascites, and class 1 obesity who presented on 12/2 with a fall.  R shoulder xray revealed anterior and inferior dislocation of humeral head with likely Hill-Sachs impact fracture; this was reduced in the ER and orthopedics is ordering an MRI for further evaluation.  Also with L quadriceps tendon rupture, underwent repair on 12/3.    Assessment & Plan Rupture of quadriceps tendon, left, initial encounter Mechanical fall x 2 MRI with ruptured R quadriceps tendon at distal patellar attachment Pain control as needed Underwent surgical repair on 04/21/2024 Fall precautions WBAT with knee immobilizer; this is to remain in place constantly and has been on consistently Given diffuse orthopedic injuries, he appears likely to benefit from CIR vs. STR Awaiting placement - Worker's Compensation has approved claim and is awaiting bed acceptance Prefers [facility] or [facility] (also listed [facility] and [facility], although these beds are more difficult to obtain for patients not in those communities) Shoulder dislocation, right, initial encounter Also with R shoulder injury MRI with bony Bankart injury with labral defect and large Hill-Sachs impaction of the posterolateral humeral head as well as full-thickness tears of supraspinatus and infraspinatus tendons This also appears to need surgical intervention, but will defer to orthopedics as an outpatient For now, nonweightbearing with R shoulder sling and swath; he is fairly noncompliant with the sling and it is usually out of place when I go to see him Of note, he previously has a LEFT shoulder MRI (10/23025), also with full-thickness tears of the supraspinatus and infraspinatus tendons; this was reviewed by Dr.  Dozier on 12/1 and is planned for conservative treatment with PT and steroid injection (subacromial injection given) for now and need for shoulder replacement in the future if unsuccessful Right knee pain In addition to the traumatic injuries as above, he reported R knee pain occurring from prior fall R knee MRI also shows quadriceps tendon rupture, although this one is not complete Orthopedics recommends WBAT with knee immobilizer and PT, conservative treatment at this time Encouraged to continue knee immobilizer Uncontrolled type 2 diabetes mellitus with hyperglycemia, with long-term current use of insulin  (HCC) Last A1c was 8.6, poor control Hold semaglutide  Continue glargine (40 units PTA), currently 30 units BID Cover with moderate-scale SSI Carb modified diet resumed, appears to have been changed post-operatively PVD (peripheral vascular disease) Resume Plavix  12/6 Continue ASA  HYPERTENSION, BENIGN ESSENTIAL BP 140/84 currently Resume spironolactone  Hyperlipidemia associated with type 2 diabetes mellitus (HCC) No longer taking Zetia  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) Strict ETOH cessation is encouraged No current concerns in this regard Check LFTs in AM Obesity (BMI 30-39.9) Body mass index is 33.05 kg/m.[name]  Weight loss should be encouraged on an ongoing basis Resume semaglutide  after discharge Outpatient PCP/bariatric medicine f/u encouraged Significantly low or high BMI is associated with higher medical risk including morbidity and mortality       Consultants: Orthopedics PT OT   Procedures: Quadriceps tendon repair 12/3   Antibiotics: None    30 Day Unplanned Readmission Risk Score    Flowsheet Row ED to Hosp-Admission (Current) from 04/20/2024 in [facility]  30 Day Unplanned Readmission Risk Score (%) 13.39 Filed at 04/27/2024 0400    This score  is the patient's risk of an unplanned readmission within 30 days of  being discharged (0 -100%). The score is based on dignosis, age, lab data, medications, orders, and past utilization.   Low:  0-14.9   Medium: 15-21.9   High: 22-29.9   Extreme: 30 and above           Subjective: Participated in PT today.  Feeling better and more motivated.  Looking forward to rehab.   Objective: Vitals:   04/26/24 2147 04/27/24 0502  BP: 123/83 124/86  Pulse: 97 87  Resp: 18 18  Temp: 98.6 F (37 C) 98.1 F (36.7 C)  SpO2: 100% 100%    Intake/Output Summary (Last 24 hours) at 04/27/2024 0637 Last data filed at 04/27/2024 0540 Gross per 24 hour  Intake 540 ml  Output 750 ml  Net -210 ml   Filed Weights   04/20/24 1751  Weight: 95.7 kg    Exam:  General:  Appears calm and comfortable and is in NAD Eyes:  normal lids, iris ENT:  grossly normal hearing, lips & tongue, mmm Cardiovascular:  RRR. No LE edema.  Respiratory:   CTA bilaterally with no wheezes/rales/rhonchi.  Normal respiratory effort. Abdomen:  soft, NT, ND Skin:  no rash or induration seen on limited exam Musculoskeletal:  R shoulder sling, B knee immobilizers in place Psychiatric:  grossly normal mood and affect, speech fluent and appropriate, AOx3 Neurologic:  CN 2-12 grossly intact, moves all extremities in coordinated fashion  Data Reviewed: I have reviewed the patient's lab results since admission.  Pertinent labs for today include:   None today     Family Communication: None present  Mobility: PT/OT Consulted and are recommending - Skilled Nursing-Short Term Rehab (<3 Hours/Day)04/26/2024 [phone]    Code Status: Full Code   Disposition: Status is: Inpatient Remains inpatient appropriate because: awaiting placement     Time spent: 35 minutes  Unresulted Labs (From admission, onward)    None        Author: Delon Herald, MD 04/27/2024 6:37 AM  For on call review [email or web address].

## 2024-04-27 NOTE — Assessment & Plan Note (Addendum)
 Mechanical fall x 2 MRI with ruptured R quadriceps tendon at distal patellar attachment Pain control as needed Underwent surgical repair on 04/21/2024 Fall precautions WBAT with knee immobilizer; this is to remain in place constantly and has been on consistently Given diffuse orthopedic injuries, he appears likely to benefit from CIR vs. STR Awaiting placement - Worker's Compensation has approved claim and is awaiting bed acceptance Prefers [facility] or [facility] (also listed [facility] and [facility], although these beds are more difficult to obtain for patients not in those communities)

## 2024-04-27 NOTE — Assessment & Plan Note (Signed)
 BP 140/84 currently Resume spironolactone 

## 2024-04-27 NOTE — Assessment & Plan Note (Addendum)
 In addition to the traumatic injuries as above, he reported R knee pain occurring from prior fall R knee MRI also shows quadriceps tendon rupture, although this one is not complete Orthopedics recommends WBAT with knee immobilizer and PT, conservative treatment at this time Encouraged to continue knee immobilizer

## 2024-04-27 NOTE — Assessment & Plan Note (Signed)
 No longer taking Zetia 

## 2024-04-27 NOTE — TOC Progression Note (Addendum)
 Transition of Care [facility]) - Progression Note    Patient Details  Name: Zachary Lewis MRN: [id number] Date of Birth: 01/06/1961  Transition of Care [facility]) CM/SW Contact  Alfonse JONELLE Rex, RN Phone Number: 04/27/2024, 2:41 PM  Clinical Narrative:  Called received from  Ethelene [place] [phone]), Workers Comp Case worker, reports claim has been accepted under Workers Comp. Claims# [id number]. Ethelene states patient able to go to any SNF that will accept.  Per teams chat from attending, family hoping for [facility] or [facility]      Expected Discharge Plan: Home/Self Care Barriers to Discharge: Continued Medical Work up               Expected Discharge Plan and Services   Discharge Planning Services: CM Consult   Living arrangements for the past 2 months: Single Family Home                 DME Arranged: N/A DME Agency: NA       HH Arranged: NA HH Agency: NA         Social Drivers of Health (SDOH) Interventions SDOH Screenings   Food Insecurity: No Food Insecurity (04/22/2024)  Housing: Low Risk  (04/22/2024)  Transportation Needs: No Transportation Needs (04/22/2024)  Utilities: Not At Risk (04/22/2024)  Depression (PHQ2-9): Low Risk  (06/02/2023)  Recent Concern: Depression (PHQ2-9) - Medium Risk (05/12/2023)  Tobacco Use: Medium Risk (04/20/2024)    Readmission Risk Interventions    04/14/2023    5:27 PM  Readmission Risk Prevention Plan  Post Dischage Appt Complete  Medication Screening Complete  Transportation Screening Complete

## 2024-04-27 NOTE — Assessment & Plan Note (Signed)
 Resume Plavix  12/6 Continue ASA

## 2024-04-28 ENCOUNTER — Other Ambulatory Visit: Payer: Self-pay

## 2024-04-28 ENCOUNTER — Other Ambulatory Visit (HOSPITAL_COMMUNITY): Payer: Self-pay

## 2024-04-28 LAB — GLUCOSE, CAPILLARY
Glucose-Capillary: 153 mg/dL — ABNORMAL HIGH (ref 70–99)
Glucose-Capillary: 193 mg/dL — ABNORMAL HIGH (ref 70–99)
Glucose-Capillary: 143 mg/dL — ABNORMAL HIGH (ref 70–99)
Glucose-Capillary: 239 mg/dL — ABNORMAL HIGH (ref 70–99)

## 2024-04-28 MED ORDER — CYCLOBENZAPRINE HCL 10 MG PO TABS: 10.0000 mg | ORAL_TABLET | Freq: Three times a day (TID) | ORAL | Status: AC | PRN

## 2024-04-28 MED ORDER — DOCUSATE SODIUM 100 MG PO CAPS: 100.0000 mg | ORAL_CAPSULE | Freq: Two times a day (BID) | ORAL | Status: AC

## 2024-04-28 MED ORDER — INSULIN ASPART 100 UNIT/ML IJ SOLN: 0.0000 [IU] | Freq: Three times a day (TID) | INTRAMUSCULAR | Status: DC

## 2024-04-28 MED ORDER — LANTUS SOLOSTAR 100 UNIT/ML ~~LOC~~ SOPN: 40.0000 [IU] | PEN_INJECTOR | Freq: Every day | SUBCUTANEOUS | Status: AC

## 2024-04-28 MED ORDER — HYDROCODONE-ACETAMINOPHEN 5-325 MG PO TABS: 1.0000 | ORAL_TABLET | ORAL | 0 refills | Status: AC | PRN

## 2024-04-28 MED ORDER — POLYETHYLENE GLYCOL 3350 17 G PO PACK: 17.0000 g | PACK | Freq: Every day | ORAL | Status: AC | PRN

## 2024-04-28 NOTE — Plan of Care (Signed)
  Problem: Nutrition: Goal: Adequate nutrition will be maintained Outcome: Progressing   Problem: Coping: Goal: Level of anxiety will decrease Outcome: Progressing   Problem: Pain Managment: Goal: General experience of comfort will improve and/or be controlled Outcome: Progressing   Problem: Safety: Goal: Ability to remain free from injury will improve Outcome: Progressing

## 2024-04-28 NOTE — Discharge Summary (Signed)
 Physician Discharge Summary  Zachary Lewis FMW:[id number] DOB: 1960-12-09 DOA: 04/20/2024  PCP: Zachary Lewis LABOR, MD  Admit date: 04/20/2024 Discharge date: 05/01/2024  Admitted From: Home Disposition: Skilled nursing facility  Discharge Condition:Stable CODE STATUS:FULL Diet recommendation: Carb Modified   Brief/Interim Summary: Patient is a 63yo with h/o T2DM, HTN, HLD, PAD, NASH cirrhosis with ascites, and class 1 obesity who presented on 12/2 with a fall. R shoulder xray revealed anterior and inferior dislocation of humeral head with likely Hill-Sachs impact fracture; this was reduced in the ER and orthopedics is ordering an MRI for further evaluation. Also with L quadriceps tendon rupture, underwent repair on 12/3.  Orthopedics cleared for discharge.  PT recommending SNF on discharge.  He needs to continue knee immobilizer use on both legs as well as sling on the right shoulder.  He will follow-up with orthopedics as an outpatient.  Medically stable for discharge today.  Following problems were addressed during the hospitalization:  Rupture of quadriceps tendon, left, initial encounter Mechanical fall x 2 MRI with ruptured R quadriceps tendon at distal patellar attachment Underwent surgical repair on 04/21/2024 WBAT with knee immobilizer; this is to remain in place constantly and has been on consistently PT recommended SNF on discharge.  Follow-up with orthopedics as an outpatient  Shoulder dislocation, right, initial encounter Also with R shoulder injury MRI with bony Bankart injury with labral defect and large Hill-Sachs impaction of the posterolateral humeral head as well as full-thickness tears of supraspinatus and infraspinatus tendons This also appears to need surgical intervention, but will defer to orthopedics as an outpatient For now, nonweightbearing with R shoulder sling and swath.he previously has a LEFT shoulder MRI (10/23025), also with full-thickness tears of the  supraspinatus and infraspinatus tendons; this was reviewed by Dr. Dozier on 12/1 and is planned for conservative treatment with PT and steroid injection (subacromial injection given) for now and need for shoulder replacement in the future if unsuccessful  Right knee pain In addition to the traumatic injuries as above, he reported R knee pain occurring from prior fall R knee MRI also shows quadriceps tendon rupture, although this one is not complete Orthopedics recommends WBAT with knee immobilizer and PT, conservative treatment at this time Encouraged to continue knee immobilizer  Uncontrolled type 2 diabetes mellitus  Last A1c was 8.6, poor control On  semaglutide , Lantus  at home.  Continue the same. Also added sliding scale.  Might need to change the doses as needed  PVD Resume Plavix  12/6 Continue ASA   HYPERTENSION  Resume spironolactone   Hyperlipidemia No longer taking Zetia   Cirrhosis of liver with ascites NASH vs ASH Strict ETOH cessation is encouraged  Obesity (BMI 30-39.9) Body mass index is 33.05 kg/m.Zachary Lewis   Discharge Diagnoses:  Principal Problem:   Rupture of quadriceps tendon, left, initial encounter Active Problems:   Hyperlipidemia associated with type 2 diabetes mellitus (HCC)   HYPERTENSION, BENIGN ESSENTIAL   Obesity (BMI 30-39.9)   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)   Uncontrolled type 2 diabetes mellitus with hyperglycemia, with long-term current use of insulin  (HCC)   PVD (peripheral vascular disease)   Shoulder dislocation, right, initial encounter   Right knee pain    Discharge Instructions  Discharge Instructions     Diet Carb Modified   Complete by: As directed    Diet Carb Modified   Complete by: As directed    Discharge instructions   Complete by: As directed    1)Please take  your medications as instructed 2)Follow up with orthopedics in 2 weeks.  Name and number the provider has been attached. 3) Continue knee  immobilizer use on both legs as well as sling on right shoulder.  May weight-bear as tolerated in the knee immobilizers.   Continue to be nonweightbearing in the right shoulder with a sling and swath. 4)Monitor your blood sugars   Discharge wound care:   Complete by: As directed    Continue current wound care plan.   Increase activity slowly   Complete by: As directed    Increase activity slowly   Complete by: As directed    No wound care   Complete by: As directed       Allergies as of 05/01/2024   No Known Allergies      Medication List     TAKE these medications    aspirin  EC 81 MG tablet Take 1 tablet (81 mg total) by mouth daily. Swallow whole.   clopidogrel  75 MG tablet Commonly known as: PLAVIX  TAKE 1 TABLET(75 MG) BY MOUTH DAILY   clotrimazole -betamethasone  cream Commonly known as: LOTRISONE  Apply 1 Application topically daily. What changed:  when to take this reasons to take this   cyclobenzaprine  10 MG tablet Commonly known as: FLEXERIL  Take 1 tablet (10 mg total) by mouth 3 (three) times daily as needed for muscle spasms.   Dexcom G7 Sensor Misc 1 Device by Does not apply route continuous.   docusate sodium  100 MG capsule Commonly known as: COLACE Take 1 capsule (100 mg total) by mouth 2 (two) times daily.   HYDROcodone -acetaminophen  5-325 MG tablet Commonly known as: NORCO/VICODIN Take 1 tablet by mouth every 4 (four) hours as needed for up to 7 days for moderate pain (pain score 4-6). What changed:  when to take this reasons to take this   insulin  aspart 100 UNIT/ML injection Commonly known as: novoLOG  Inject 0-5 Units into the skin at bedtime.   Insulin  Pen Needle 31G X 8 MM Misc Inject 1 Needle into the skin daily at 6 (six) AM.   Pen Needles 32G X 4 MM Misc 1 Device by Does not apply route in the morning, at noon, in the evening, and at bedtime.   Lantus  SoloStar 100 UNIT/ML Solostar Pen Generic drug: insulin  glargine Inject 40  Units into the skin daily.   lidocaine  5 % Commonly known as: LIDODERM  Place 1 patch onto the skin daily. Remove & Discard patch within 12 hours or as directed by MD Start taking on: May 02, 2024   OneTouch Delica Plus Lancet33G Misc USE AS DIRECTED IN THE MORNING, AT NOON, AND AT BEDTIME   OneTouch Verio test strip Generic drug: glucose blood 1 each by Other route daily. And lancets 1/day   polyethylene glycol 17 g packet Commonly known as: MIRALAX  / GLYCOLAX  Take 17 g by mouth daily as needed for mild constipation.   Repatha  SureClick 140 MG/ML Soaj Generic drug: Evolocumab  ADMINISTER 1 ML UNDER THE SKIN EVERY 14 DAYS   Semaglutide  (2 MG/DOSE) 8 MG/3ML Sopn Inject 2 mg as directed once a week.   spironolactone  50 MG tablet Commonly known as: ALDACTONE  TAKE 1 TABLET(50 MG) BY MOUTH DAILY   tadalafil  20 MG tablet Commonly known as: CIALIS  Take 1 tablet (20 mg total) by mouth daily as needed. What changed: reasons to take this               Discharge Care Instructions  (From admission, onward)  Start     Ordered   05/01/24 0000  Discharge wound care:       Comments: Continue current wound care plan.   05/01/24 1316            Contact information for follow-up providers     Reyne Cordella SQUIBB, MD. Schedule an appointment as soon as possible for a visit in 2 week(s).   Specialty: Orthopedic Surgery Contact information: [address] [place] [address] [phone]              Contact information for after-discharge care     Destination     [facility] .   Service: Skilled Nursing Contact information: [address] [place]  [address] [phone]                    No Known Allergies  Consultations: Orthopedics   Procedures/Studies: DG Shoulder Right Result Date: 04/30/2024 EXAM: 1 VIEW(S) XRAY OF THE SHOULDER 04/30/2024 03:05:00 AM COMPARISON: 04/29/2024 CLINICAL HISTORY:  Shoulder dislocation FINDINGS: BONES AND JOINTS: Glenohumeral joint is normally aligned following interval reduction of shoulder dislocation. Hill-Sachs deformity of the posterolateral humeral head is present. Small osseous fragments along the glenoid rim likely represent acute fracture fragments. No malalignment. The [facility] joint is unremarkable. SOFT TISSUES: No abnormal calcifications. Visualized lung is unremarkable. IMPRESSION: 1. Interval reduction of shoulder dislocation. 2. Small osseous fragments along the glenoid rim, likely representing acute fracture fragments. 3. Hill-Sachs deformity of the posterolateral humeral head. Electronically signed by: Morgane Naveau MD 04/30/2024 03:11 AM EST RP Workstation: [id number]   DG Shoulder 1V Right Result Date: 04/30/2024 EXAM: 1 VIEW(S) XRAY OF THE RIGHT SHOULDER 04/30/2024 12:02:00 AM COMPARISON: 04/29/2024 CLINICAL HISTORY: [id number] Injury [id number] FINDINGS: BONES AND JOINTS: Anterior dislocation of the right shoulder. Hill-Sachs deformity of the right humeral head. The [facility] joint is unremarkable. SOFT TISSUES: No abnormal calcifications. Visualized lung is unremarkable. IMPRESSION: 1. Anterior dislocation of the right shoulder. 2. Hill-Sachs deformity of the right humeral head. Electronically signed by: Pinkie Pebbles MD 04/30/2024 12:08 AM EST RP Workstation: [id number]   DG Shoulder 1V Right Result Date: 04/29/2024 EXAM: LIMITED SINGLE VIEW XRAY OF THE RIGHT SHOULDER 04/29/2024 10:06:00 PM COMPARISON: None available. CLINICAL HISTORY: [id number] Pain [id number] Pain FINDINGS: BONES AND JOINTS: There appears to be anterior shoulder dislocation. This is difficult to confirm without a y or axillary view. Degenerative changes in the right AC joint. No acute fracture. SOFT TISSUES: No abnormal calcifications. Visualized lung is unremarkable. IMPRESSION: 1. Probable anterior shoulder dislocation, difficult to confirm without a Y or axillary view. 2. Degenerative changes in  the right AC joint. Electronically signed by: Franky Crease MD 04/29/2024 10:16 PM EST RP Workstation: [id number]   MR KNEE RIGHT WO CONTRAST Result Date: 04/22/2024 CLINICAL DATA:  Knee pain.  History of recent injury. EXAM: MRI OF THE RIGHT KNEE WITHOUT CONTRAST TECHNIQUE: Multiplanar, multisequence MR imaging of the knee was performed. No intravenous contrast was administered. COMPARISON:  None Available. FINDINGS: MENISCI Medial: Intact. Lateral: Complex predominantly horizontally oriented tear of the posterior horn of the medial meniscus. LIGAMENTS Cruciates: ACL and PCL are intact. Collaterals: Medial collateral ligament is intact. Lateral collateral ligament complex is intact. CARTILAGE Patellofemoral: Mild partial-thickness cartilage loss at the medial patellar facet. Medial: Chondral fissuring and irregularity at the weight-bearing aspect of the medial femoral condyle. Lateral:  No chondral defect. JOINT: Small knee joint effusion communicates through the defect in the quadriceps tendon into the anterior subcutaneous tissues.  POPLITEAL FOSSA: Popliteus tendon is intact. Small to moderate-sized Baker's cyst measuring approximately 3.5 x 3.4 x 1.4 cm with internal debris. EXTENSOR MECHANISM: Ruptured distal quadriceps tendon at the attachment on the superior pole of the patella with approximately 2.2 cm of tendon retraction with fluid-filled gap. Some remaining fibers of the vastus lateralis and rectus femoris appear intact and attached to the patella. Edema surrounding the distal quadriceps musculature. Discontinuity of the medial retinaculum proximally and edema surrounding the lateral retinaculum. Patellar tendon is intact. BONES: No aggressive osseous lesion. No fracture or dislocation. Other: Soft tissue swelling of the anterior distal thigh and knee extending medially and laterally. IMPRESSION: 1. Ruptured right distal quadriceps tendon at the attachment on the superior pole of the patella with  approximately 2.2 cm of tendon retraction and fluid-filled gap. Some remaining fibers of the vastus lateralis and rectus femoris appear intact and attached to the patella. Associated small knee joint effusion extends into the anterior subcutaneous tissues. 2. Tear of the medial retinaculum proximally and sprain of the lateral retinaculum. 3. Complex predominantly horizontally oriented tear of the posterior horn of the medial meniscus. 4. Chondral fissuring and irregularity at the weight-bearing aspect of the medial femoral condyle. Electronically Signed   By: Harrietta Sherry M.D.   On: 04/22/2024 09:43   MR Shoulder Right W Wo Contrast Result Date: 04/21/2024 EXAM: MRI OF THE RIGHT SHOULDER WITH AND WITHOUT CONTRAST 04/21/2024 08:14:21 AM TECHNIQUE: Multiplanar multisequence MRI of the right shoulder was performed with and without the administration of 9 mL of gadobutrol  (GADAVIST ) 1 MMOL/ML intravenous contrast. COMPARISON: Radiographs 04/20/2024. CLINICAL HISTORY: Fall, right shoulder pain, shoulder dislocation. FINDINGS: ROTATOR CUFF: Full thickness full width rupture of the supraspinatus tendon which is retracted about 1.6 cm. Full thickness partial width tear of the infraspinatus tendon superiorly. Moderate atrophy of the supraspinatus, infraspinatus, and teres minor muscles. Moderate distal subscapularis tendinopathy. Substantial edema tracks along the scapular margin of the subscapularis muscle likely reflecting muscle strain or tear. Intact teres minor tendon. BICEPS TENDON: Mild tendinopathy of the intraarticular segment of the long head of the biceps. The long head biceps tendon is normally located. LABRUM: Bony Bankart injury noted with bony defect in the anterior inferior labrum and adjacent complexity in the joint likely with displacement of fragments and tearing of the anterior inferior labrum. No paralabral cyst. GLENOHUMERAL JOINT: A large glenohumeral joint effusion communicates with the  subacromial subdeltoid bursa. Moderate complexity in the joint effusion especially anteriorly adjacent to the glenoid. Bony Bankart injury noted with bony defect in the anterior inferior labrum and adjacent complexity in the joint likely with displacement of fragments and tearing of the anterior inferior labrum. Large Hill-Sachs impaction of the posterolateral humeral head potentially with impacted cortical fragments as on image 8 series 7. Mild degrees of synovial enhancement in the glenohumeral joint, but less than I would expect for active infection. Mild to moderate degenerative glenohumeral chondral thinning. Alignment is altered by the Hill-Sachs impaction and Bankart lesion. AC JOINT AND ACROMIOCLAVICULAR ARCH: Moderate degenerative acromioclavicular joint arthropathy with spurring and subcortical marrow edema. Curved acromial undersurface. Intact acromioclavicular and coracoclavicular ligaments. BURSA: A large glenohumeral joint effusion communicates with the subacromial subdeltoid bursa, consistent with significant subacromial/subdeltoid bursitis. BONE MARROW: Bony Bankart injury noted with bony defect in the anterior inferior labrum and adjacent complexity in the joint likely with displacement of fragments and tearing of the anterior inferior labrum. Large Hill-Sachs impaction of the posterolateral humeral head potentially with impacted cortical fragments as on  image 8 series 7. Subcortical marrow edema associated with moderate degenerative acromioclavicular joint arthropathy. Substantial edema tracks along the scapular margin of the subscapularis muscle likely reflecting muscle strain or tear. OUTLET SPACES: Normal MRI appearance of the quadrilateral space. No significant narrowing of the supraspinatus outlet. SOFT TISSUES: No focal abnormality of the subcutaneous soft tissues. IMPRESSION: 1. Bony Bankart injury with bony defect in the anterior inferior labrum and adjacent joint complexity, likely with  displacement of fragments and tearing of the anterior inferior labrum. 2. Large Hill-Sachs impaction of the posterolateral humeral head, potentially with impacted cortical fragments. 3. Full thickness, full width rupture of the supraspinatus tendon with 1.6 cm retraction and moderate atrophy of the supraspinatus, infraspinatus, and teres minor muscles. 4. Full thickness partial width tear of the infraspinatus tendon superiorly. 5. Moderate distal subscapularis tendinopathy with associated muscle strain or tear. 6. Large glenohumeral joint effusion with moderate complexity, especially anteriorly adjacent to the glenoid, communicating with the subacromial subdeltoid bursa. 7. Moderate degenerative acromioclavicular joint arthropathy with spurring and subcortical marrow edema. 8. Mild to moderate degenerative glenohumeral chondral thinning. 9. Mild tendinopathy of the intraarticular segment of the long head of the biceps. Electronically signed by: Ryan Salvage MD 04/21/2024 08:50 AM EST RP Workstation: [id number]   MR KNEE LEFT WO CONTRAST Result Date: 04/21/2024 EXAM: MRI of the Left Knee Without Contrast. 04/21/2024 07:47:29 AM TECHNIQUE: Multiplanar multisequence MRI of the left knee was performed without intravenous contrast. COMPARISON: Radiographs 04/20/2024. CLINICAL HISTORY: Knee trauma, suspected quadriceps tendon rupture. FINDINGS: MEDIAL MENISCUS: Substantial grade 1 signal in the posterior horn and mid body medial meniscus without definite surface extension. LATERAL MENISCUS: Degenerative tearing of the mid body and anterior horn lateral meniscus with ill defined accentuated signal along the free edge and superior surface of the mid body, and ill defined of the anterior horn. ANTERIOR CRUCIATE LIGAMENT: Intact anterior cruciate ligament. POSTERIOR CRUCIATE LIGAMENT: Intact posterior cruciate ligament. EXTENSOR MECHANISM: Ruptured quadriceps tendon and its distal patellar attachment with a 1.1 cm  fluid filled gap between the blunted distal margin of the tendon and the upper patella. Medial retinaculum and medial patellofemoral ligament appear torn proximally. A small contribution from the vastus lateralis appears to be still attached to the patella. Substantial edema within and surrounding the distal vastus lateralis and medialis muscles. LATERAL COLLATERAL LIGAMENT COMPLEX: Iliotibial band intact distally. Intact lateral collateral ligament proper, biceps femoris tendon and popliteus tendon. MEDIAL COLLATERAL LIGAMENT COMPLEX: The superficial and deep components of the medial collateral ligament are intact. KNEE JOINT: Mild chondral thinning superiorly along the lateral patellar facet. Chondral fissuring and irregularity posteriorly along the medial femoral condyle as on image 14 series 12. Mild degenerative chondral thinning in the medial and lateral compartments. Mild marginal spurring in the lateral compartment. Small knee joint effusion communicates through the defect in the quadriceps tendon to the anterior subcutaneous tissues. Small baker cyst. BONE MARROW: No acute fracture or aggressive marrow replacing lesion. SOFT TISSUE: Posterolateral subdated cystic lesion along the upper margin of the popliteus muscle favoring ganglion cyst measuring 3.5 x 1.6 cm. 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 IMPRESSION: 1. Ruptured quadriceps tendon at the distal patellar attachment with a 1.1 cm fluid-filled gap; associated small knee joint effusion tracking into the anterior subcutaneous tissues. 2. Degenerative tearing of the mid body and anterior horn of the lateral meniscus. 3. Grade 1 signal in the posterior horn and mid body of the medial meniscus without surface extension. 4. Mild chondral thinning  along the lateral patellar facet. 5. Chondral fissuring and irregularity along the posterior medial femoral condyle. 6. Mild degenerative chondral thinning in the medial and lateral  compartments with mild marginal spurring in the lateral compartment. 7. Medial retinacular and medial patellofemoral ligament tears proximally with edema in the distal vastus lateralis and medialis. 8. Posterolateral fluid signal intensity lesion along the upper margin of the popliteus muscle favoring a ganglion cyst measuring 3.5 x 1.6 cm. 9. 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 EXAM: RIGHT SHOULDER - 1 VIEW COMPARISON:  Radiograph earlier today FINDINGS: The previous anterior shoulder dislocation has been reduced. Moderate Hill-Sachs impaction injury to the lateral humeral head. Mild irregularity about the inferior glenoid may represent fragmented osteophyte or bony Bankart. The acromioclavicular joint is normally aligned. IMPRESSION: 1. Interval reduction of anterior shoulder dislocation. 2. Moderate impaction injury to the lateral humeral head. 3. Mild irregularity about the inferior glenoid may represent fragmented osteophyte or bony Bankart. Electronically Signed   By: Andrea Gasman M.D.   On: 04/20/2024 20:30   DG Knee Complete 4 Views Left Result Date: 04/20/2024 CLINICAL DATA:  Injury mixed field EXAM: LEFT KNEE - COMPLETE 4+ VIEW COMPARISON:  None Available. FINDINGS: There is soft tissue swelling superior to the patella. There some soft tissue calcifications in this region which may be related to old injury. Difficult to assess for joint effusion secondary to flexion of the knee. No definite acute fracture or dislocation identified. Joint spaces are well maintained. Peripheral vascular calcifications are present. Rounded calcification posterior to the knee measures 2.1 cm, possibly popliteal aneurysm. IMPRESSION: 1.  No definite acute fracture or dislocation. 2. Soft tissue swelling superior to the patella. 3. Rounded calcification posterior to the knee measures 2.1 cm, possibly popliteal aneurysm. This can be further evaluated with ultrasound. Electronically Signed   By: Greig Pique M.D.   On: 04/20/2024 19:34   DG Shoulder Right Result Date: 04/20/2024 CLINICAL DATA:  injury, hurt right shoulder and left knee after fall at work today EXAM: RIGHT SHOULDER - 2+ VIEW COMPARISON:  None Available. FINDINGS: Anterior and inferior dislocation of the humeral head. Moderate AC joint space loss. Likely impaction fracture along the posterosuperior humeral head. Soft tissues are unremarkable. IMPRESSION: Anterior and inferior dislocation of the humeral head with likely Hill-Sachs impaction fracture. Electronically Signed   By: Rogelia Myers M.D.   On: 04/20/2024 18:34      Subjective: Patient seen and examined at bedside today.  Hemodynamically stable.  Overall comfortable.  Pain well-controlled.  Medically stable for discharge to SNF whenever possible.  Discharge Exam: Vitals:   04/30/24 2139 05/01/24 0640  BP: (!) 140/90 118/85  Pulse: (!) 103 92  Resp: 16 15  Temp: 98 F (36.7 C) 98.7 F (37.1 C)  SpO2: 100% 100%   Vitals:   04/30/24 0106 04/30/24 0550 04/30/24 2139 05/01/24 0640  BP: (!) 157/92 (!) 179/101 (!) 140/90 118/85  Pulse: 94 92 (!) 103 92  Resp: 18 15 16 15   Temp: (!) 97.4 F (36.3 C) 98.3 F (36.8 C) 98 F (36.7 C) 98.7 F (37.1 C)  TempSrc: Oral Oral Oral   SpO2: 99% 98% 100% 100%  Weight:      Height:        General: Pt  is alert, awake, not in acute distress Cardiovascular: RRR, S1/S2 +, no rubs, no gallops Respiratory: CTA bilaterally, no wheezing, no rhonchi Abdominal: Soft, NT, ND, bowel sounds + Extremities: no edema, no cyanosis, sling on the right arm, knee immobilizer on bilateral  lower extremities    The results of significant diagnostics from this hospitalization  (including imaging, microbiology, ancillary and laboratory) are listed below for reference.     Microbiology: No results found for this or any previous visit (from the past 240 hours).    Labs: BNP (last 3 results) No results for input(s): BNP in the last 8760 hours. Basic Metabolic Panel: Recent Labs  Lab 04/26/24 0329 04/30/24 0325  NA 134* 133*  K 4.7 4.0  CL 100 100  CO2 22 22  GLUCOSE 181* 144*  BUN 17 19  CREATININE 0.67 0.69  CALCIUM 9.9 9.0   Liver Function Tests: No results for input(s): AST, ALT, ALKPHOS, BILITOT, PROT, ALBUMIN in the last 168 hours.  No results for input(s): LIPASE, AMYLASE in the last 168 hours. No results for input(s): AMMONIA in the last 168 hours. CBC: Recent Labs  Lab 04/26/24 0329 04/30/24 0325  WBC 8.8 9.0  HGB 14.1 12.6*  HCT 42.8 37.2*  MCV 104.4* 101.1*  PLT 302 365   Cardiac Enzymes: No results for input(s): CKTOTAL, CKMB, CKMBINDEX, TROPONINI in the last 168 hours. BNP: Invalid input(s): POCBNP CBG: Recent Labs  Lab 04/30/24 1134 04/30/24 1630 04/30/24 2126 05/01/24 0719 05/01/24 1235  GLUCAP 166* 178* 165* 103* 102*   D-Dimer No results for input(s): DDIMER in the last 72 hours. Hgb A1c No results for input(s): HGBA1C in the last 72 hours. Lipid Profile No results for input(s): CHOL, HDL, LDLCALC, TRIG, CHOLHDL, LDLDIRECT in the last 72 hours. Thyroid  function studies No results for input(s): TSH, T4TOTAL, T3FREE, THYROIDAB in the last 72 hours.  Invalid input(s): FREET3 Anemia work up No results for input(s): VITAMINB12, FOLATE, FERRITIN, TIBC, IRON, RETICCTPCT in the last 72 hours. Urinalysis    Component Value Date/Time   COLORURINE YELLOW 04/18/2023 0545   APPEARANCEUR CLEAR 04/18/2023 0545   LABSPEC 1.020 04/18/2023 0545   PHURINE 5.0 04/18/2023 0545   GLUCOSEU >=500 (A) 04/18/2023 0545   HGBUR NEGATIVE 04/18/2023 0545    BILIRUBINUR NEGATIVE 04/18/2023 0545   KETONESUR NEGATIVE 04/18/2023 0545   PROTEINUR 100 (A) 04/18/2023 0545   NITRITE NEGATIVE 04/18/2023 0545   LEUKOCYTESUR NEGATIVE 04/18/2023 0545   Sepsis Labs Recent Labs  Lab 04/26/24 0329 04/30/24 0325  WBC 8.8 9.0   Microbiology No results found for this or any previous visit (from the past 240 hours).   Please note: You were cared for by a hospitalist during your hospital stay. Once you are discharged, your primary care physician will handle any further medical issues. Please note that NO REFILLS for any discharge medications will be authorized once you are discharged, as it is imperative that you return to your primary care physician (or establish a relationship with a primary care physician if you do not have one) for your post hospital discharge needs so that they can reassess your need for medications and monitor your lab values.    Time coordinating discharge: 35 minutes  SIGNED:   Leatrice LILLETTE Chapel, MD  [facility] 05/01/2024, 1:18 PM Pager [phone]  If 7PM-7AM, please contact night-coverage [email or web address] Password [email or web address]

## 2024-04-28 NOTE — TOC Progression Note (Addendum)
 Transition of Care [facility]) - Progression Note    Patient Details  Name: Zachary Lewis MRN: [id number] Date of Birth: November 05, 1960  Transition of Care [facility] CM/SW Contact  Alfonse JONELLE Rex, RN Phone Number: 04/28/2024, 10:09 AM  Clinical Narrative:   Call to patient's spouse, Veva, to review SNF bed offers [facility], [facility], [facility]) , Veva reports initial recommendation was for [facility] and inquiring if that is still an option. NCM reached out to CIR for re-evaluation for CIR, will keep spouse updated.   -10:20am Teams chat received from CIR  CIR is not pursuing due to tolerance, I do not think he is at a point where he could tolerate the intensity of CIR, though if he progresses a bit more I can reconsider . Will continue with SNF process   -11:17am Call received from patient's spouse, states she spoke with [facility], confirmed they do not accept Workers Comp, request NCM to extend SNF bed search for short term rehab, also requesting NCM reach out to [facility] in [place], [place]. NCM called to [facility], [place] in admissions, states she will review with her administrator and contact NCM with determination. SNF bed search extended in SNF HUB, await bed offers     Expected Discharge Plan: Home/Self Care Barriers to Discharge: Continued Medical Work up               Expected Discharge Plan and Services   Discharge Planning Services: CM Consult   Living arrangements for the past 2 months: Single Family Home                 DME Arranged: N/A DME Agency: NA       HH Arranged: NA HH Agency: NA         Social Drivers of Health (SDOH) Interventions SDOH Screenings   Food Insecurity: No Food Insecurity (04/22/2024)  Housing: Low Risk  (04/22/2024)  Transportation Needs: No Transportation Needs (04/22/2024)  Utilities: Not At Risk (04/22/2024)  Depression (PHQ2-9): Low Risk  (06/02/2023)  Recent Concern: Depression (PHQ2-9) - Medium Risk (05/12/2023)   Tobacco Use: Medium Risk (04/20/2024)    Readmission Risk Interventions    04/14/2023    5:27 PM  Readmission Risk Prevention Plan  Post Dischage Appt Complete  Medication Screening Complete  Transportation Screening Complete

## 2024-04-29 ENCOUNTER — Inpatient Hospital Stay (HOSPITAL_COMMUNITY)

## 2024-04-29 ENCOUNTER — Other Ambulatory Visit (HOSPITAL_COMMUNITY): Payer: Self-pay

## 2024-04-29 DIAGNOSIS — S76112A Strain of left quadriceps muscle, fascia and tendon, initial encounter: Secondary | ICD-10-CM | POA: Diagnosis not present

## 2024-04-29 LAB — GLUCOSE, CAPILLARY
Glucose-Capillary: 147 mg/dL — ABNORMAL HIGH (ref 70–99)
Glucose-Capillary: 128 mg/dL — ABNORMAL HIGH (ref 70–99)
Glucose-Capillary: 182 mg/dL — ABNORMAL HIGH (ref 70–99)
Glucose-Capillary: 126 mg/dL — ABNORMAL HIGH (ref 70–99)

## 2024-04-29 MED ORDER — HYDROMORPHONE HCL 1 MG/ML IJ SOLN
1.0000 mg | Freq: Once | INTRAMUSCULAR | Status: AC
  Administered 2024-04-29: 1 mg via INTRAVENOUS
  Filled 2024-04-29: qty 1

## 2024-04-29 MED ORDER — HYDROMORPHONE HCL 1 MG/ML IJ SOLN
0.5000 mg | Freq: Once | INTRAMUSCULAR | Status: AC
  Administered 2024-04-29: 0.5 mg via INTRAVENOUS
  Filled 2024-04-29: qty 0.5

## 2024-04-29 NOTE — Plan of Care (Signed)

## 2024-04-29 NOTE — Progress Notes (Signed)
 Physical Therapy Treatment Patient Details Name: Zachary Lewis MRN: [id number] DOB: 04/07/1961 Today's Date: 04/29/2024   History of Present Illness Pt is a 63 y/o M admitted on 04/20/24 after presenting with c/o a fall. Pt found to have R shoulder dislocation that was reduced int he ER. Pt also found to have LLE quad tendon rupture s/p surgical repair 04/21/24. Pt with c/o chronic R knee pain 2/2 a fall & found to have incomplete quad tendon rupture. PMH: DM2, HTN, HLD, PAD, NASH cirrhosis with ascites, obesity    PT Comments  PM session Assisted Pt back to bed via sliding board.  Required increased time and increased effort laterally scooting from low to high bed.  Then assisted with EOB to supine and positioned to comfort.  Positioned in bed to comfort.  Pt awaiting placement for ST Rehab at SNF.   If plan is discharge home, recommend the following: Two people to help with walking and/or transfers;Two people to help with bathing/dressing/bathroom;Assist for transportation;Assistance with cooking/housework;Help with stairs or ramp for entrance   Can travel by private vehicle     No  Equipment Recommendations  Rolling walker (2 wheels);BSC/3in1;Hospital bed;Wheelchair (measurements PT);Hoyer lift;Wheelchair cushion (measurements PT)    Recommendations for Other Services       Precautions / Restrictions Precautions Precautions: Fall Recall of Precautions/Restrictions: Impaired Precaution/Restrictions Comments: No ROM B knees Required Braces or Orthoses: Knee Immobilizer - Left;Knee Immobilizer - Right;Sling;Other Brace Knee Immobilizer - Right: On at all times Knee Immobilizer - Left: On at all times Other Brace: R UE sling on all time Restrictions Weight Bearing Restrictions Per Provider Order: Yes RUE Weight Bearing Per Provider Order: Non weight bearing RLE Weight Bearing Per Provider Order: Weight bearing as tolerated LLE Weight Bearing Per Provider Order: Weight bearing as  tolerated Other Position/Activity Restrictions: WBAT BLEs with KI's     Mobility  Bed Mobility Overal bed mobility: Needs Assistance Bed Mobility: Sit to Supine     Supine to sit: Mod assist, +2 for physical assistance, +2 for safety/equipment, HOB elevated, Used rails Sit to supine: Mod assist   General bed mobility comments: assisted back to bed with assist for B LE and scooting to [facility]    Transfers Overall transfer level: Needs assistance Equipment used: Sliding board Transfers: Bed to chair/wheelchair/BSC Sit to Stand: Max assist, +2 physical assistance, +2 safety/equipment, From elevated surface           General transfer comment: using sliding board to lateral scooting from recliner back to bed Mod Assist.    Ambulation/Gait         Gait velocity: decreased     General Gait Details: Pt was able to take a few steps with + 2 side by side assist using walker for L UE but NOT on R UE (sling).  Pt had great difficulty weightshifting onto his L LE due to increased pain.  Pt had difficulty advancing R LE due to increased pain with weight shifting onto LE.  Pt used several small, shuffled steps to amb 12 inches from the bed then complete 1/4 turn to back up to recliner.  Distance limited by L knee pain with weightbearing and amount of effort needed to advance either LE. Reports 12/10 L knee pain.   Stairs             Wheelchair Mobility     Tilt Bed    Modified Rankin (Stroke Patients Only)       Balance  Communication Communication Communication: No apparent difficulties  Cognition Arousal: Alert Behavior During Therapy: WFL for tasks assessed/performed                           PT - Cognition Comments: AxO x 3 pleasant and willing. Following commands: Intact Following commands impaired: Follows multi-step commands inconsistently, Follows multi-step commands with increased  time    Cueing Cueing Techniques: Verbal cues  Exercises      General Comments        Pertinent Vitals/Pain Pain Assessment Pain Assessment: Faces Faces Pain Scale: Hurts little more Pain Location: L knee with attempted amb Pain Descriptors / Indicators: Operative site guarding, Grimacing, Guarding, Discomfort Pain Intervention(s): Monitored during session, Premedicated before session, Repositioned, Ice applied    Home Living                          Prior Function            PT Goals (current goals can now be found in the care plan section) Progress towards PT goals: Progressing toward goals    Frequency    Min 3X/week      PT Plan      Co-evaluation              AM-PAC PT 6 Clicks Mobility   Outcome Measure  Help needed turning from your back to your side while in a flat bed without using bedrails?: A Lot Help needed moving from lying on your back to sitting on the side of a flat bed without using bedrails?: A Lot Help needed moving to and from a bed to a chair (including a wheelchair)?: A Lot Help needed standing up from a chair using your arms (e.g., wheelchair or bedside chair)?: A Lot Help needed to walk in hospital room?: Total Help needed climbing 3-5 steps with a railing? : Total 6 Click Score: 10    End of Session Equipment Utilized During Treatment: Gait belt Activity Tolerance: Patient tolerated treatment well Patient left: in bed;with call bell/phone within reach Nurse Communication: Mobility status;Weight bearing status;Precautions PT Visit Diagnosis: Muscle weakness (generalized) (M62.81);Pain;Other abnormalities of gait and mobility (R26.89);Difficulty in walking, not elsewhere classified (R26.2);History of falling (Z91.81) Pain - Right/Left: Left Pain - part of body: Knee     Time: 8367-8343 PT Time Calculation (min) (ACUTE ONLY): 24 min  Charges:    $Therapeutic Activity: 23-37 mins PT General Charges $$ ACUTE PT  VISIT: 1 Visit                     Katheryn Leap  PTA Acute  Rehabilitation Services Office M-F          [phone]

## 2024-04-29 NOTE — Progress Notes (Signed)
 PROGRESS NOTE  Zachary Lewis  FMW:[id number] DOB: May 27, 1960 DOA: 04/20/2024 PCP: Randeen Laine LABOR, MD   Brief Narrative: Patient is a [phone] with h/o T2DM, HTN, HLD, PAD, NASH cirrhosis with ascites, and class 1 obesity who presented on 12/2 with a fall. R shoulder xray revealed anterior and inferior dislocation of humeral head with likely Hill-Sachs impact fracture; this was reduced in the ER and orthopedics is ordering an MRI for further evaluation. Also with L quadriceps tendon rupture, underwent repair on 12/3.  Orthopedics cleared for discharge.  PT recommending SNF on discharge.  He needs to continue knee immobilizer use on both legs as well as sling on the right shoulder.  He will follow-up with orthopedics as an outpatient.  Medically stable for discharge whenever possible.  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  Follow-up with orthopedics as an outpatient   Shoulder dislocation, right, initial encounter Also with R shoulder injury MRI with bony Bankart injury with labral defect and large Hill-Sachs impaction of the posterolateral humeral head as well as full-thickness tears of supraspinatus and  infraspinatus tendons This also appears to need surgical intervention, but will defer to orthopedics as an outpatient For now, nonweightbearing with R shoulder sling and swath.he previously has a LEFT shoulder MRI (10/23025), also with full-thickness tears of the supraspinatus and infraspinatus tendons; this was reviewed by Dr. Dozier on 12/1 and is planned for conservative treatment with PT and steroid injection (subacromial injection given) for now and need for shoulder replacement in the future if unsuccessful   Right knee pain In addition to the traumatic injuries as above, he reported R knee pain occurring from prior fall R knee MRI also shows quadriceps tendon rupture, although this one is not complete Orthopedics recommends WBAT with knee immobilizer and PT, conservative treatment at this time Encouraged to continue knee immobilizer   Uncontrolled type 2 diabetes mellitus  Last A1c was 8.6, poor control On  semaglutide , Lantus  at home.  Continue the same. Also added sliding scale.  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 Strict ETOH cessation is encouraged           DVT prophylaxis:enoxaparin  (LOVENOX ) injection 40 mg Start: 04/25/24 1700     Code Status: Full Code  Family Communication: None at bedside  Patient status:Inpatient  Patient is from :Home  Anticipated discharge to:SNF  Estimated DC date:whenever possible   Consultants: Orthopedics  Procedures: Repair of quadriceps tendon  Antimicrobials:  Anti-infectives (From admission, onward)    Start     Dose/Rate Route Frequency Ordered Stop  04/22/24 0300  ceFAZolin  (ANCEF ) IVPB 2g/100 mL premix        2 g 200 mL/hr over 30 Minutes Intravenous Every 6 hours 04/21/24 2346 04/22/24 1510   04/21/24 1000  ceFAZolin  (ANCEF ) IVPB 2g/100 mL  premix        2 g 200 mL/hr over 30 Minutes Intravenous On call to O.R. 04/21/24 0901 04/21/24 2108       Subjective: Patient seen and examined at bedside today.  Hemodynamically stable.  Lying in bed.  Comfortable.  Pain well-controlled.  No new complaints.  Waiting for bed at SNF.  Objective: Vitals:   04/28/24 0609 04/28/24 1323 04/28/24 2052 04/29/24 0549  BP: 126/74 133/86 132/85 (!) 137/90  Pulse: 85 91 99 87  Resp: 15 14 18 18   Temp: 97.7 F (36.5 C) 97.8 F (36.6 C) 98.7 F (37.1 C) 97.8 F (36.6 C)  TempSrc: Oral Oral Oral Oral  SpO2: 100% 100% 98% 99%  Weight:      Height:        Intake/Output Summary (Last 24 hours) at 04/29/2024 1236 Last data filed at 04/29/2024 0600 Gross per 24 hour  Intake 480 ml  Output 1200 ml  Net -720 ml   Filed Weights   04/20/24 1751  Weight: 95.7 kg    Examination:  General exam: Overall comfortable, not in distress, obese HEENT: PERRL Respiratory system:  no wheezes or crackles  Cardiovascular system: S1 & S2 heard, RRR.  Gastrointestinal system: Abdomen is nondistended, soft and nontender. Central nervous system: Alert and oriented Extremities: No edema, no clubbing ,no cyanosis, knee immobilizer on bilateral lower extremities, sling on the right arm Skin: No rashes, no ulcers,no icterus     Data Reviewed: I have personally reviewed following labs and imaging studies  CBC: Recent Labs  Lab 04/24/24 0613 04/26/24 0329  WBC 9.8 8.8  HGB 13.4 14.1  HCT 39.6 42.8  MCV 102.3* 104.4*  PLT 263 302   Basic Metabolic Panel: Recent Labs  Lab 04/24/24 0613 04/26/24 0329  NA 135 134*  K 4.4 4.7  CL 98 100  CO2 25 22  GLUCOSE 208* 181*  BUN 15 17  CREATININE 0.70 0.67  CALCIUM 9.7 9.9     Recent Results (from the past 240 hours)  Surgical PCR screen     Status: None   Collection Time: 04/21/24  1:35 AM   Specimen: Nasal Mucosa; Nasal Swab  Result Value Ref Range Status   MRSA, PCR NEGATIVE NEGATIVE Final    Staphylococcus aureus NEGATIVE NEGATIVE Final    Comment: (NOTE) The Xpert SA Assay (FDA approved for NASAL specimens in patients 71 years of age and older), is one component of a comprehensive surveillance program. It is not intended to diagnose infection nor to guide or monitor treatment. Performed at [facility], [address], [place], [place] [address]      Radiology Studies: No results found.  Scheduled Meds:  aspirin  EC  81 mg Oral Daily   clopidogrel   75 mg Oral Daily   docusate sodium   100 mg Oral BID   enoxaparin  (LOVENOX ) injection  40 mg Subcutaneous Q24H   insulin  aspart  0-20 Units Subcutaneous TID WC   insulin  aspart  0-5 Units Subcutaneous QHS   insulin  glargine  30 Units Subcutaneous BID   spironolactone   50 mg Oral Daily   Continuous Infusions:   LOS: 9 days   Ivonne Mustache, MD [facility] P12/03/2024, 12:36 PM

## 2024-04-29 NOTE — Progress Notes (Signed)
 Physical Therapy Treatment Patient Details Name: Zachary Lewis MRN: [id number] DOB: 1961-02-28 Today's Date: 04/29/2024   History of Present Illness Pt is a 63 y/o M admitted on 04/20/24 after presenting with c/o a fall. Pt found to have R shoulder dislocation that was reduced int he ER. Pt also found to have LLE quad tendon rupture s/p surgical repair 04/21/24. Pt with c/o chronic R knee pain 2/2 a fall & found to have incomplete quad tendon rupture. PMH: DM2, HTN, HLD, PAD, NASH cirrhosis with ascites, obesity    PT Comments  POD # 8 Adjusted and tightened B KI's.  Assisted to EOB.  General bed mobility comments: required increased time and use of B LE leg straps also using a plastic bag under bed pad to increased self ability to swival/scoot to EOB.  Assisted with standing.  General transfer comment: Pt at this time attempted to complete sit to stand transfers with max x2 assist with bed elevated  with x3 attempts with RW infront of pt. Gave pt to hold a wash cloth for RUE as would still attempt to try to push/pull even within the sling.  Able to static stand with assist for balance but great difficulty weightshifting onto L LE to attemp forward steps. Amb was very difficult.  General Gait Details: Pt was able to take a few steps with + 2 side by side assist using walker for L UE but NOT on R UE (sling).  Pt had great difficulty weightshifting onto his L LE due to increased pain.  Pt had difficulty advancing R LE due to increased pain with weight shifting onto LE.  Pt used several small, shuffled steps to amb 12 inches from the bed then complete 1/4 turn to back up to recliner.  Distance limited by L knee pain with weightbearing and amount of effort needed to advance either LE. Reports 12/10 L knee pain. Positioned in recliner to comfort.   Prior Pt was home, IND and working before he fell.  LPT has rec Pt will need ST Rehab at SNF to address mobility and functional decline prior to safely returning  home.    If plan is discharge home, recommend the following: Two people to help with walking and/or transfers;Two people to help with bathing/dressing/bathroom;Assist for transportation;Assistance with cooking/housework;Help with stairs or ramp for entrance   Can travel by private vehicle     No  Equipment Recommendations  Rolling walker (2 wheels);BSC/3in1;Hospital bed;Wheelchair (measurements PT);Hoyer lift;Wheelchair cushion (measurements PT)    Recommendations for Other Services       Precautions / Restrictions Precautions Precautions: Fall Precaution/Restrictions Comments: No ROM B knees Required Braces or Orthoses: Knee Immobilizer - Left;Knee Immobilizer - Right;Sling;Other Brace Knee Immobilizer - Right: On at all times Knee Immobilizer - Left: On at all times Other Brace: R UE sling on all time Restrictions Weight Bearing Restrictions Per Provider Order: Yes RUE Weight Bearing Per Provider Order: Non weight bearing LLE Weight Bearing Per Provider Order: Weight bearing as tolerated Other Position/Activity Restrictions: WBAT BLEs with KI's     Mobility  Bed Mobility Overal bed mobility: Needs Assistance Bed Mobility: Supine to Sit     Supine to sit: Mod assist, +2 for physical assistance, +2 for safety/equipment, HOB elevated, Used rails     General bed mobility comments: required increased time and use of B LE leg straps also using a plastic bag under bed pad to increased self ability to swival/scoot to EOB.    Transfers Overall transfer  level: Needs assistance Equipment used: Rolling walker (2 wheels) Transfers: Sit to/from Stand Sit to Stand: Max assist, +2 physical assistance, +2 safety/equipment, From elevated surface           General transfer comment: Pt at this time attempted to complete sit to stand transfers with max x2 assist with bed elevated  with x3 attempts with RW infront of pt. Gave pt to hold a wash cloth for RUE as would still attempt  to try to push/pull even within the sling.  Able to static stand with assist for balance but great difficulty weightshifting onto L LE to attemp forward steps.    Ambulation/Gait         Gait velocity: decreased     General Gait Details: Pt was able to take a few steps with + 2 side by side assist using walker for L UE but NOT on R UE (sling).  Pt had great difficulty weightshifting onto his L LE due to increased pain.  Pt had difficulty advancing R LE due to increased pain with weight shifting onto LE.  Pt used several small, shuffled steps to amb 12 inches from the bed then complete 1/4 turn to back up to recliner.  Distance limited by L knee pain with weightbearing and amount of effort needed to advance either LE. Reports 12/10 L knee pain.   Stairs             Wheelchair Mobility     Tilt Bed    Modified Rankin (Stroke Patients Only)       Balance                                            Communication Communication Communication: No apparent difficulties  Cognition Arousal: Alert Behavior During Therapy: WFL for tasks assessed/performed                           PT - Cognition Comments: AxO x 3 pleasant and willing. Following commands: Intact Following commands impaired: Follows multi-step commands inconsistently, Follows multi-step commands with increased time    Cueing Cueing Techniques: Verbal cues  Exercises      General Comments        Pertinent Vitals/Pain Pain Assessment Pain Assessment: Faces Faces Pain Scale: Hurts worst Pain Location: L knee with attempted amb Pain Descriptors / Indicators: Operative site guarding, Grimacing, Guarding, Discomfort Pain Intervention(s): Monitored during session, Premedicated before session, Repositioned, Patient requesting pain meds-RN notified, Ice applied    Home Living                          Prior Function            PT Goals (current goals can now be  found in the care plan section) Progress towards PT goals: Progressing toward goals    Frequency    Min 3X/week      PT Plan      Co-evaluation              AM-PAC PT 6 Clicks Mobility   Outcome Measure  Help needed turning from your back to your side while in a flat bed without using bedrails?: A Lot Help needed moving from lying on your back to sitting on the side of a flat bed without using bedrails?: A  Lot Help needed moving to and from a bed to a chair (including a wheelchair)?: A Lot Help needed standing up from a chair using your arms (e.g., wheelchair or bedside chair)?: A Lot Help needed to walk in hospital room?: A Lot Help needed climbing 3-5 steps with a railing? : Total 6 Click Score: 11    End of Session Equipment Utilized During Treatment: Gait belt;Right knee immobilizer Activity Tolerance: Patient limited by pain Patient left: in chair;with call bell/phone within reach Nurse Communication: Mobility status;Weight bearing status;Precautions PT Visit Diagnosis: Muscle weakness (generalized) (M62.81);Pain;Other abnormalities of gait and mobility (R26.89);Difficulty in walking, not elsewhere classified (R26.2);History of falling (Z91.81) Pain - Right/Left: Left Pain - part of body: Knee     Time: 8971-8944 PT Time Calculation (min) (ACUTE ONLY): 27 min  Charges:    $Therapeutic Activity: 23-37 mins PT General Charges $$ ACUTE PT VISIT: 1 Visit                     Katheryn Leap  PTA Acute  Rehabilitation Services Office M-F          [phone]

## 2024-04-29 NOTE — TOC Progression Note (Addendum)
 Transition of Care [facility]) - Progression Note    Patient Details  Name: Zachary Lewis MRN: [id number] Date of Birth: 1961-05-20  Transition of Care [facility]) CM/SW Contact  Alfonse JONELLE Rex, RN Phone Number: 04/29/2024, 10:34 AM  Clinical Narrative:   Called to [place], [place] in admissions, confirmed no beds available at this time. Call to patient's spouse, updated her on no bed availability at [facility], reviewed two current SNF bed offers, spouse states she will have to research facilities more, NCM encouraged her to schedule a tour. Await bed choice, team updated.   -1:55pm Call received from spouse, accepted bed offer at [facility]. NCM called to [employer], spoke with Olam in admissions, Workers Comp information provided as [employer] has Community Education Officer as editor, commissioning. Olam to call NCM with determination.   2:15pm Call received from [place] in admission, business office requesting Workers Comp information, [facility] and name provided, await call back with determination.     Expected Discharge Plan: Home/Self Care Barriers to Discharge: Continued Medical Work up               Expected Discharge Plan and Services   Discharge Planning Services: CM Consult   Living arrangements for the past 2 months: Single Family Home                 DME Arranged: N/A DME Agency: NA       HH Arranged: NA HH Agency: NA         Social Drivers of Health (SDOH) Interventions SDOH Screenings   Food Insecurity: No Food Insecurity (04/22/2024)  Housing: Low Risk (04/22/2024)  Transportation Needs: No Transportation Needs (04/22/2024)  Utilities: Not At Risk (04/22/2024)  Depression (PHQ2-9): Low Risk (06/02/2023)  Recent Concern: Depression (PHQ2-9) - Medium Risk (05/12/2023)  Tobacco Use: Medium Risk (04/20/2024)    Readmission Risk Interventions    04/14/2023    5:27 PM  Readmission Risk Prevention Plan  Post Dischage Appt Complete  Medication Screening Complete   Transportation Screening Complete

## 2024-04-30 ENCOUNTER — Inpatient Hospital Stay (HOSPITAL_COMMUNITY)

## 2024-04-30 LAB — BASIC METABOLIC PANEL WITH GFR
Anion gap: 11 (ref 5–15)
BUN: 19 mg/dL (ref 8–23)
CO2: 22 mmol/L (ref 22–32)
Calcium: 9 mg/dL (ref 8.9–10.3)
Chloride: 100 mmol/L (ref 98–111)
Creatinine, Ser: 0.69 mg/dL (ref 0.61–1.24)
GFR, Estimated: >60 mL/min (ref >60)
Glucose, Bld: 144 mg/dL — ABNORMAL HIGH (ref 70–99)
Potassium: 4 mmol/L (ref 3.5–5.1)
Sodium: 133 mmol/L — ABNORMAL LOW (ref 135–145)

## 2024-04-30 LAB — CBC
HCT: 37.2 % — ABNORMAL LOW (ref 39.0–52.0)
Hemoglobin: 12.6 g/dL — ABNORMAL LOW (ref 13.0–17.0)
MCH: 34.2 pg — ABNORMAL HIGH (ref 26.0–34.0)
MCHC: 33.9 g/dL (ref 30.0–36.0)
MCV: 101.1 fL — ABNORMAL HIGH (ref 80.0–100.0)
Platelets: 365 K/uL (ref 150–400)
RBC: 3.68 MIL/uL — ABNORMAL LOW (ref 4.22–5.81)
RDW: 13.1 % (ref 11.5–15.5)
WBC: 9 K/uL (ref 4.0–10.5)
nRBC: 0 % (ref 0.0–0.2)

## 2024-04-30 LAB — GLUCOSE, CAPILLARY
Glucose-Capillary: 132 mg/dL — ABNORMAL HIGH (ref 70–99)
Glucose-Capillary: 166 mg/dL — ABNORMAL HIGH (ref 70–99)
Glucose-Capillary: 178 mg/dL — ABNORMAL HIGH (ref 70–99)
Glucose-Capillary: 165 mg/dL — ABNORMAL HIGH (ref 70–99)

## 2024-04-30 MED ORDER — HYDROMORPHONE HCL 1 MG/ML IJ SOLN
1.0000 mg | INTRAMUSCULAR | Status: DC | PRN
  Administered 2024-04-30: 1 mg via INTRAVENOUS
  Filled 2024-04-30: qty 1

## 2024-04-30 MED ORDER — FENTANYL CITRATE (PF) 50 MCG/ML IJ SOSY
25.0000 ug | PREFILLED_SYRINGE | Freq: Once | INTRAMUSCULAR | Status: AC
  Administered 2024-04-30: 25 ug via INTRAVENOUS
  Filled 2024-04-30: qty 1

## 2024-04-30 MED ADMIN — Lidocaine Patch 5%: 1 | TRANSDERMAL | NDC 65162079104

## 2024-04-30 MED FILL — Lidocaine Patch 5%: 1.0000 | CUTANEOUS | Qty: 1 | Status: AC

## 2024-04-30 NOTE — Progress Notes (Addendum)
° °      Overnight   NAME: Zachary Lewis MRN: [id number] DOB : 08-14-1960    Date of Service   04/30/2024   HPI/Events of Note    63 year old male presented with a fall on 12/2 right shoulder x-ray revealed an anterior-inferior dislocation of the humeral head with likely hill- [place]  impact fracture.  This was reduced in the emergency room. L quadriceps tendon rupture repair on 12/3.  Patient has since been cleared for discharge with a plan to discharge to rehab 12/12.  Overnight patient reports moving arm to hand something to the nurse and expirenced a popping sensation that felt like he redislocated his shoulder. With new onset of pain 10/10 Shoulder x-ray was ordered .   Impression:  1. Anterior dislocation of the right shoulder. 2. Hill-Sachs deformity of the right humeral head.  On assessment patient has minimal abduction and adduction of right arm.  Strength 2/5 denies any decrease sensation.  Temperature normal. Radial pulse 2+  Pain management implemented  Interventions/ Plan   Xray  Pain management   Ortho consult      Laneta Gardener- Aram BSN RN CCRN AGACNP-BC Acute Care Nurse Practitioner [facility]

## 2024-04-30 NOTE — Progress Notes (Signed)
 OT Cancellation Note  Patient Details Name: Zachary Lewis MRN: [id number] DOB: 10/15/1960   Cancelled Treatment:    Reason Eval/Treat Not Completed: Pain limiting ability to participate  Patient asleep upon OT arrival after up at night d/t ortho issues. Appreciate chart noting new shoulder dislocation. Patient with pain and requesting politely OT to check back later. OT will continue to follow acutely as schedule and patient ability allows.   Tryton Bodi OT/L Acute Rehabilitation Department  [phone]   04/30/2024, 10:44 AM

## 2024-04-30 NOTE — Progress Notes (Signed)
 Subjective:  I was contacted by the cross-cover nurse practitioner for the patient's service.  Overnight apparently he reached training himself to the nurse when he felt new onset of pain in the right shoulder.  Again he had previously had a shoulder dislocation on presentation to the emergency room approximate week and a half ago.  X-rays were obtained which showed a repeat dislocation.  I came and evaluated the patient.  He complained primarily of right shoulder pain.  He reported no new discomfort in the arm or hand numbness or tingling.  He reported that his legs have been doing well.     Objective:   VITALS:   Vitals:   04/29/24 0549 04/29/24 1320 04/29/24 2116 04/30/24 0106  BP: (!) 137/90 (!) 137/97 129/88 (!) 157/92  Pulse: 87 (!) 108 93 94  Resp: 18 16 15 18   Temp: 97.8 F (36.6 C) 97.6 F (36.4 C) 98.3 F (36.8 C) (!) 97.4 F (36.3 C)  TempSrc: Oral Oral Oral Oral  SpO2: 99% 100% 100% 99%  Weight:      Height:        Physical exam  Well-appearing gentleman no acute distress.  There is a palpable defect in the posterior aspect of the shoulder consistent with an anterior dislocation.  He has intact sensation in the axillary Mus cutaneous radial ulnar and median nerve distributions 5 out of 5 strength in the AIN, PIN and ulnar nerve distributions  Several of his bilateral lower extremities are kept in a knee immobilizers.  Clean dressing on his left knee  Lab Results  Component Value Date   WBC 8.8 04/26/2024   HGB 14.1 04/26/2024   HCT 42.8 04/26/2024   MCV 104.4 (H) 04/26/2024   PLT 302 04/26/2024   BMET    Component Value Date/Time   NA 134 (L) 04/26/2024 0329   K 4.7 04/26/2024 0329   CL 100 04/26/2024 0329   CO2 22 04/26/2024 0329   GLUCOSE 181 (H) 04/26/2024 0329   BUN 17 04/26/2024 0329   CREATININE 0.67 04/26/2024 0329   CREATININE 0.75 12/11/2023 1135   CALCIUM 9.9 04/26/2024 0329   EGFR 101 12/11/2023 1135   GFRNONAA >60 04/26/2024 0329       Recent x-rays show a right shoulder dislocation  Procedure: After obtaining consent from the patient we elected with a closed reduction.  Gentle traction was applied to the shoulder.  Once traction was applied gentle range of motion was applied until the shoulder was felt to be relocated.  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  We have performed intact of the right shoulder closed reduction.  While in clinical exam, the shoulder appears to be well reduced.  We are still waiting x-rays.  Will obtain these this evening.  Hopefully this will show acceptable reduction.  If not, we will plan for a closed reduction in the OR under sedation with the use of fluoroscopic  imaging.  Will keep the patient n.p.o. for now.  If the shoulder is relocated, we can then advance his diet.  Will keep him in a sling and swath.  With regards to his legs, he seems to doing well.  He is making good progress.  Will continue with the immobilizer.  Left knee can continue dressings as needed.    Cordella SHAUNNA Rhein 04/30/2024, 2:39 AM   Cordella Rhein, MD, MS [facility] Orthopedics Specialist / [place] [phone]   Contact information:   Tzzxijbd 7am-5pm epic message Dr. Rhein, or call office for patient follow up: [phone] After hours and holidays please check [email or web address] for group call information for [facility]

## 2024-04-30 NOTE — TOC Progression Note (Addendum)
 Transition of Care [facility]) - Progression Note    Patient Details  Name: Zachary Lewis MRN: [id number] Date of Birth: Oct 27, 1960  Transition of Care [facility]) CM/SW Contact  Alfonse JONELLE Rex, RN Phone Number: 04/30/2024, 12:07 PMClinical Narrative:   patient experienced  repeat right shoulder dislocation on 12/11,  right shoulder closed reduction performed,  x-rays pending. If not acceptable reduction plan for a closed reduction in the OR under sedation. INPT CM following.   -1:42 Call to [facility] in admissions with [facility], states she spoke with Ethelene, Workers Radiation Protection Practitioner, states her business office requested to speak with her as all was provided was a Claims# and facility is used to Auth#. Olam to follow up with her business office and updated NCM.  -2;15pm Call received from [facility] in admissions with [facility], states patient approved and accepted for admission for short term rehab, NCM updated Olam on patient's medical condition, possible dc this weekend. Olam confirmed weekend admissions are accepted. Harlene -on call weekend contact - [phone].  Per teams chat from Dr Rosario, possible dc tomorrow. Call to patient's spouse, updated on dc plans     Expected Discharge Plan: Home/Self Care Barriers to Discharge: Continued Medical Work up               Expected Discharge Plan and Services   Discharge Planning Services: CM Consult   Living arrangements for the past 2 months: Single Family Home                 DME Arranged: N/A DME Agency: NA       HH Arranged: NA HH Agency: NA         Social Drivers of Health (SDOH) Interventions SDOH Screenings   Food Insecurity: No Food Insecurity (04/22/2024)  Housing: Low Risk (04/22/2024)  Transportation Needs: No Transportation Needs (04/22/2024)  Utilities: Not At Risk (04/22/2024)  Depression (PHQ2-9): Low Risk (06/02/2023)  Recent Concern: Depression (PHQ2-9) - Medium Risk (05/12/2023)  Tobacco Use: Medium  Risk (04/20/2024)    Readmission Risk Interventions    04/14/2023    5:27 PM  Readmission Risk Prevention Plan  Post Dischage Appt Complete  Medication Screening Complete  Transportation Screening Complete

## 2024-04-30 NOTE — Progress Notes (Signed)
 PROGRESS NOTE  Zachary Lewis  FMW:[id number] DOB: 10/02/1960 DOA: 04/20/2024 PCP: Randeen Laine LABOR, MD   Brief Narrative: Patient is a [phone] with h/o T2DM, HTN, HLD, PAD, NASH cirrhosis with ascites, and class 1 obesity who presented on 12/2 with a fall. R shoulder xray revealed anterior and inferior dislocation of humeral head with likely Hill-Sachs impact fracture; this was reduced in the ER and orthopedics is ordering an MRI for further evaluation. Also with L quadriceps tendon rupture, underwent repair on 12/3.  Orthopedics cleared for discharge.  PT recommending SNF on discharge.  He needs to continue knee immobilizer use on both legs as well as sling on the right shoulder.  He will follow-up with orthopedics as an outpatient.  Medically stable for discharge whenever possible.  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  Follow-up with orthopedics as an outpatient   Shoulder dislocation, right, initial encounter Also with R shoulder injury MRI with bony Bankart injury with labral defect and large Hill-Sachs impaction of the posterolateral humeral head as well as full-thickness tears of supraspinatus and  infraspinatus tendons This also appears to need surgical intervention, but will defer to orthopedics as an outpatient For now, nonweightbearing with R shoulder sling and swath.he previously has a LEFT shoulder MRI (10/23025), also with full-thickness tears of the supraspinatus and infraspinatus tendons; this was reviewed by Dr. Dozier on 12/1 and is planned for conservative treatment with PT and steroid injection (subacromial injection given) for now and need for shoulder replacement in the future if unsuccessful   Right knee pain In addition to the traumatic injuries as above, he reported R knee pain occurring from prior fall R knee MRI also shows quadriceps tendon rupture, although this one is not complete Orthopedics recommends WBAT with knee immobilizer and PT, conservative treatment at this time Encouraged to continue knee immobilizer   Uncontrolled type 2 diabetes mellitus  Last A1c was 8.6, poor control On  semaglutide , Lantus  at home.  Continue the same. Also added sliding scale.  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 Strict ETOH cessation is encouraged           DVT prophylaxis:enoxaparin  (LOVENOX ) injection 40 mg Start: 04/25/24 1700     Code Status: Full Code  Family Communication: None at bedside  Patient status:Inpatient  Patient is from :Home  Anticipated discharge to:SNF  Estimated DC date:whenever possible   Consultants: Orthopedics  Procedures: Repair of quadriceps tendon  Antimicrobials:  Anti-infectives (From admission, onward)    Start     Dose/Rate Route Frequency Ordered Stop  04/22/24 0300  ceFAZolin  (ANCEF ) IVPB 2g/100 mL premix        2 g 200 mL/hr over 30 Minutes Intravenous Every 6 hours 04/21/24 2346 04/22/24 1510   04/21/24 1000  ceFAZolin  (ANCEF ) IVPB 2g/100 mL  premix        2 g 200 mL/hr over 30 Minutes Intravenous On call to O.R. 04/21/24 0901 04/21/24 2108       Subjective: Patient seen and examined at bedside today.  Hemodynamically stable.  Lying in bed.  Comfortable.  Pain well-controlled.  No new complaints.  Waiting for bed at SNF.  Objective: Vitals:   04/29/24 1320 04/29/24 2116 04/30/24 0106 04/30/24 0550  BP: (!) 137/97 129/88 (!) 157/92 (!) 179/101  Pulse: (!) 108 93 94 92  Resp: 16 15 18 15   Temp: 97.6 F (36.4 C) 98.3 F (36.8 C) (!) 97.4 F (36.3 C) 98.3 F (36.8 C)  TempSrc: Oral Oral Oral Oral  SpO2: 100% 100% 99% 98%  Weight:      Height:        Intake/Output Summary (Last 24 hours) at 04/30/2024 1738 Last data filed at 04/30/2024 0900 Gross per 24 hour  Intake 960 ml  Output 350 ml  Net 610 ml   Filed Weights   04/20/24 1751  Weight: 95.7 kg    Examination:  General exam: Overall comfortable, not in distress, obese HEENT: PERRL Respiratory system:  no wheezes or crackles  Cardiovascular system: S1 & S2 heard, RRR.  Gastrointestinal system: Abdomen is nondistended, soft and nontender. Central nervous system: Alert and oriented Extremities: No edema, no clubbing ,no cyanosis, knee immobilizer on bilateral lower extremities, sling on the right arm Skin: No rashes, no ulcers,no icterus     Data Reviewed: I have personally reviewed following labs and imaging studies  CBC: Recent Labs  Lab 04/24/24 0613 04/26/24 0329 04/30/24 0325  WBC 9.8 8.8 9.0  HGB 13.4 14.1 12.6*  HCT 39.6 42.8 37.2*  MCV 102.3* 104.4* 101.1*  PLT 263 302 365   Basic Metabolic Panel: Recent Labs  Lab 04/24/24 0613 04/26/24 0329 04/30/24 0325  NA 135 134* 133*  K 4.4 4.7 4.0  CL 98 100 100  CO2 25 22 22   GLUCOSE 208* 181* 144*  BUN 15 17 19   CREATININE 0.70 0.67 0.69  CALCIUM 9.7 9.9 9.0     Recent Results (from the past 240 hours)  Surgical PCR screen     Status: None   Collection Time: 04/21/24  1:35 AM    Specimen: Nasal Mucosa; Nasal Swab  Result Value Ref Range Status   MRSA, PCR NEGATIVE NEGATIVE Final   Staphylococcus aureus NEGATIVE NEGATIVE Final    Comment: (NOTE) The Xpert SA Assay (FDA approved for NASAL specimens in patients 66 years of age and older), is one component of a comprehensive surveillance program. It is not intended to diagnose infection nor to guide or monitor treatment. Performed at [facility], [address], [place], [place] [address]      Radiology Studies: DG Shoulder Right Result Date: 04/30/2024 EXAM: 1 VIEW(S) XRAY OF THE SHOULDER 04/30/2024 03:05:00 AM COMPARISON: 04/29/2024 CLINICAL HISTORY: Shoulder dislocation FINDINGS: BONES AND JOINTS: Glenohumeral joint is normally aligned following interval reduction of shoulder dislocation. Hill-Sachs deformity of the posterolateral humeral head is present. Small osseous fragments along the glenoid rim likely represent acute fracture fragments. No malalignment. The [facility] joint is unremarkable. SOFT TISSUES: No abnormal calcifications. Visualized lung is unremarkable. IMPRESSION: 1. Interval reduction of  shoulder dislocation. 2. Small osseous fragments along the glenoid rim, likely representing acute fracture fragments. 3. Hill-Sachs deformity of the posterolateral humeral head. Electronically signed by: Morgane Naveau MD 04/30/2024 03:11 AM EST RP Workstation: [id number]   DG Shoulder 1V Right Result Date: 04/30/2024 EXAM: 1 VIEW(S) XRAY OF THE RIGHT SHOULDER 04/30/2024 12:02:00 AM COMPARISON: 04/29/2024 CLINICAL HISTORY: [id number] Injury [id number] FINDINGS: BONES AND JOINTS: Anterior dislocation of the right shoulder. Hill-Sachs deformity of the right humeral head. The [facility] joint is unremarkable. SOFT TISSUES: No abnormal calcifications. Visualized lung is unremarkable. IMPRESSION: 1. Anterior dislocation of the right shoulder. 2. Hill-Sachs deformity of the right humeral head. Electronically signed by:  Pinkie Pebbles MD 04/30/2024 12:08 AM EST RP Workstation: [id number]   DG Shoulder 1V Right Result Date: 04/29/2024 EXAM: LIMITED SINGLE VIEW XRAY OF THE RIGHT SHOULDER 04/29/2024 10:06:00 PM COMPARISON: None available. CLINICAL HISTORY: [id number] Pain [id number] Pain FINDINGS: BONES AND JOINTS: There appears to be anterior shoulder dislocation. This is difficult to confirm without a y or axillary view. Degenerative changes in the right AC joint. No acute fracture. SOFT TISSUES: No abnormal calcifications. Visualized lung is unremarkable. IMPRESSION: 1. Probable anterior shoulder dislocation, difficult to confirm without a Y or axillary view. 2. Degenerative changes in the right AC joint. Electronically signed by: Franky Crease MD 04/29/2024 10:16 PM EST RP Workstation: [id number]    Scheduled Meds:  aspirin  EC  81 mg Oral Daily   clopidogrel   75 mg Oral Daily   docusate sodium   100 mg Oral BID   enoxaparin  (LOVENOX ) injection  40 mg Subcutaneous Q24H   insulin  aspart  0-20 Units Subcutaneous TID WC   insulin  aspart  0-5 Units Subcutaneous QHS   insulin  glargine  30 Units Subcutaneous BID   lidocaine   1 patch Transdermal Q24H   spironolactone   50 mg Oral Daily   Continuous Infusions:   LOS: 10 days   Leatrice LILLETTE Chapel, MD [facility] P12/04/2024, 5:38 PM

## 2024-05-01 ENCOUNTER — Other Ambulatory Visit (HOSPITAL_COMMUNITY): Payer: Self-pay

## 2024-05-01 LAB — GLUCOSE, CAPILLARY
Glucose-Capillary: 103 mg/dL — ABNORMAL HIGH (ref 70–99)
Glucose-Capillary: 102 mg/dL — ABNORMAL HIGH (ref 70–99)
Glucose-Capillary: 144 mg/dL — ABNORMAL HIGH (ref 70–99)

## 2024-05-01 MED ORDER — LIDOCAINE 5 % EX PTCH
1.0000 | MEDICATED_PATCH | CUTANEOUS | 0 refills | Status: AC
  Filled 2024-05-01: qty 30, 30d supply, fill #0

## 2024-05-01 MED ORDER — INSULIN ASPART 100 UNIT/ML FLEXPEN
0.0000 [IU] | PEN_INJECTOR | Freq: Every day | SUBCUTANEOUS | 11 refills | Status: AC
  Filled 2024-05-01: qty 15, 90d supply, fill #0

## 2024-05-01 MED ADMIN — Lidocaine Patch 5%: 1 | TRANSDERMAL | NDC 65162079104

## 2024-05-01 NOTE — TOC Progression Note (Signed)
 Transition of Care [facility]) - Progression Note    Patient Details  Name: Zachary Lewis MRN: [id number] Date of Birth: 01-04-1961  Transition of Care [facility]) CM/SW Contact  Sonda Manuella Quill, RN Phone Number: 05/01/2024, 1:03 PM  Clinical Narrative:    Notified pt ready for d/c; spoke w/ Harlene on call at [facility] [phone]); she said pt can admit today and gave RM # [id number], call report # [phone]; transport by PTAR; Dr Rosario and Delon, RN notified; awaiting D/C summary.   Expected Discharge Plan: Home/Self Care Barriers to Discharge: Continued Medical Work up               Expected Discharge Plan and Services   Discharge Planning Services: CM Consult   Living arrangements for the past 2 months: Single Family Home                 DME Arranged: N/A DME Agency: NA       HH Arranged: NA HH Agency: NA         Social Drivers of Health (SDOH) Interventions SDOH Screenings   Food Insecurity: No Food Insecurity (04/22/2024)  Housing: Low Risk (04/22/2024)  Transportation Needs: No Transportation Needs (04/22/2024)  Utilities: Not At Risk (04/22/2024)  Depression (PHQ2-9): Low Risk (06/02/2023)  Recent Concern: Depression (PHQ2-9) - Medium Risk (05/12/2023)  Tobacco Use: Medium Risk (04/20/2024)    Readmission Risk Interventions    04/14/2023    5:27 PM  Readmission Risk Prevention Plan  Post Dischage Appt Complete  Medication Screening Complete  Transportation Screening Complete

## 2024-05-01 NOTE — TOC Transition Note (Signed)
 Transition of Care [facility]) - Discharge Note   Patient Details  Name: Zachary Lewis MRN: [id number] Date of Birth: 07-May-1961  Transition of Care [facility]) CM/SW Contact:  Sonda Manuella Quill, RN Phone Number: 05/01/2024, 1:53 PM   Clinical Narrative:    D/C orders received; spoke w/ Harlene on call at [facility] [phone]); she said pt can admit today and gave RM # [id number], call report # [phone]; transport by [facility]; spoke w/ pt's wife and she agreed to d/c plan; Mrs Cupp said she will notify pt; [facility] called at 47; spoke w/ operator # [phone]; SNF transfer report and d/c summary sent via SNF hub; no IP CM needs  Final next level of care: Skilled Nursing Facility Barriers to Discharge: No Barriers Identified   Patient Goals and CMS Choice Patient states their goals for this hospitalization and ongoing recovery are:: home          Discharge Placement              Patient chooses bed at: Other - please specify in the comment section below: ([facility]) Patient to be transferred to facility by: PTAR Name of family member notified: [name] Samples (spouse) Patient and family notified of of transfer: 05/01/24  Discharge Plan and Services Additional resources added to the After Visit Summary for     Discharge Planning Services: CM Consult            DME Arranged: N/A DME Agency: NA       HH Arranged: NA HH Agency: NA        Social Drivers of Health (SDOH) Interventions SDOH Screenings   Food Insecurity: No Food Insecurity (04/22/2024)  Housing: Low Risk (04/22/2024)  Transportation Needs: No Transportation Needs (04/22/2024)  Utilities: Not At Risk (04/22/2024)  Depression (PHQ2-9): Low Risk (06/02/2023)  Recent Concern: Depression (PHQ2-9) - Medium Risk (05/12/2023)  Tobacco Use: Medium Risk (04/20/2024)     Readmission Risk Interventions    04/14/2023    5:27 PM  Readmission Risk Prevention Plan  Post Dischage Appt Complete  Medication  Screening Complete  Transportation Screening Complete

## 2024-05-01 NOTE — Plan of Care (Signed)
  Problem: Safety: Goal: Ability to remain free from injury will improve Outcome: Progressing   Problem: Pain Managment: Goal: General experience of comfort will improve and/or be controlled Outcome: Progressing   Problem: Elimination: Goal: Will not experience complications related to urinary retention Outcome: Progressing

## 2024-05-03 ENCOUNTER — Other Ambulatory Visit (HOSPITAL_COMMUNITY): Payer: Self-pay

## 2024-05-18 ENCOUNTER — Telehealth: Payer: Self-pay

## 2024-05-18 ENCOUNTER — Other Ambulatory Visit: Payer: Self-pay | Admitting: Family Medicine

## 2024-05-18 NOTE — Transitions of Care (Post Inpatient/ED Visit) (Signed)
 "  05/18/2024  Name: Zachary Lewis MRN: [id number] DOB: [birth year]  Today's TOC FU Call Status: Today's TOC FU Call Status:: Successful TOC FU Call Completed TOC FU Call Complete Date: 05/18/24  Patient's Name and Date of Birth confirmed. Name, DOB  Transition Care Management Follow-up Telephone Call Date of Discharge: 05/17/24 Discharge Facility: Other [facility]) Name of Other ([facility]) Discharge Facility: [facility] Type of Discharge: Inpatient Admission Primary Inpatient Discharge Diagnosis:: dislocated shoulder How have you been since you were released from the hospital?: Better Any questions or concerns?: No  Items Reviewed: Did you receive and understand the discharge instructions provided?: Yes Medications obtained,verified, and reconciled?: Yes (Medications Reviewed) Any new allergies since your discharge?: No Dietary orders reviewed?: Yes Do you have support at home?: Yes People in Home [RPT]: spouse  Medications Reviewed Today: Medications Reviewed Today     Reviewed by Emmitt Pan, LPN (Licensed Practical Nurse) on 05/18/24 at 1040  Med List Status: <None>   Medication Order Taking? Sig Documenting Provider Last Dose Status Informant  aspirin  EC 81 MG tablet [id number] Yes Take 1 tablet (81 mg total) by mouth daily. Swallow whole. Darron Deatrice LABOR, MD  Active Self, Pharmacy Records  clopidogrel  (PLAVIX ) 75 MG tablet [id number] Yes TAKE 1 TABLET(75 MG) BY MOUTH DAILY Arida, Muhammad A, MD  Active   clotrimazole -betamethasone  (LOTRISONE ) cream [id number]  Apply 1 Application topically daily.  Patient not taking: Reported on 05/18/2024   Janit Thresa HERO, DPM  Active Self, Pharmacy Records  Continuous Glucose Sensor [facility] [place]) [place] [id number] Yes 1 Device by Does not apply route continuous. Motwani, Komal, MD  Active Self, Pharmacy Records  cyclobenzaprine  (FLEXERIL ) 10 MG tablet [id number] Yes Take 1 tablet (10 mg total) by mouth 3 (three) times daily  as needed for muscle spasms. Jillian Buttery, MD  Active   docusate sodium  (COLACE) 100 MG capsule [id number] Yes Take 1 capsule (100 mg total) by mouth 2 (two) times daily. Jillian Buttery, MD  Active   glucose blood [facility] VER[facility]) test strip [id number] Yes 1 each by Other route daily. And lancets 1/day Kassie Mallick, MD  Active Self, Pharmacy Records  insulin  aspart (NOVOLOG ) 100 UNIT/ML FlexPen [id number] Yes Inject 0-5 Units into the skin at bedtime. Rosario Leatrice FERNS, MD  Active   insulin  glargine (LANTUS  SOLOSTAR) 100 UNIT/ML Solostar Pen [id number] Yes Inject 40 Units into the skin daily. Jillian Buttery, MD  Active   Insulin  Pen Needle (PEN NEEDLES) 32G X 4 MM MISC [id number] Yes 1 Device by Does not apply route in the morning, at noon, in the evening, and at bedtime. Dartha Ernst, MD  Active Self, Pharmacy Records  Insulin  Pen Needle 31G X 8 MM MISC [id number] Yes Inject 1 Needle into the skin daily at 6 (six) AM. Dartha Ernst, MD  Active Self, Pharmacy Records  Lancets [facility] DELICA PLUS [place]) MISC [id number] Yes USE AS DIRECTED IN THE MORNING, AT NOON, AND AT BEDTIME Dartha Ernst, MD  Active Self, Pharmacy Records  lidocaine  (LIDODERM ) 5 % [id number] Yes Place 1 patch onto the skin daily. Remove & Discard patch within 12 hours or as directed by MD Rosario Leatrice FERNS, MD  Active   polyethylene glycol (MIRALAX  / GLYCOLAX ) 17 g packet [id number] Yes Take 17 g by mouth daily as needed for mild constipation. Jillian Buttery, MD  Active   REPATHA  SURECLICK 140 MG/ML EMMANUEL [id number] Yes ADMINISTER 1 ML UNDER THE SKIN EVERY 14 DAYS Darron Deatrice LABOR, MD  Active Self, Pharmacy Records  Semaglutide , 2 MG/DOSE, 8 MG/3ML SOPN [id number] Yes Inject 2 mg as directed once a week. Dartha Ernst, MD  Active Self, Pharmacy Records  spironolactone  (ALDACTONE ) 50 MG tablet [id number] Yes TAKE 1 TABLET(50 MG) BY MOUTH DAILY Tower, Laine LABOR, MD  Active   tadalafil  (CIALIS ) 20 MG tablet [id number] Yes Take 1  tablet (20 mg total) by mouth daily as needed.  Patient taking differently: Take 20 mg by mouth daily as needed for erectile dysfunction.     Active Self, Pharmacy Records            Home Care and Equipment/Supplies: Were Home Health Services Ordered?: Yes Name of Home Health Agency:: unknown Has Agency set up a time to come to your home?: No Any new equipment or medical supplies ordered?: Yes Name of Medical supply agency?: unknown Were you able to get the equipment/medical supplies?: Yes Do you have any questions related to the use of the equipment/supplies?: No  Functional Questionnaire: Do you need assistance with bathing/showering or dressing?: Yes Do you need assistance with meal preparation?: Yes Do you need assistance with eating?: No Do you have difficulty maintaining continence: No Do you need assistance with getting out of bed/getting out of a chair/moving?: Yes Do you have difficulty managing or taking your medications?: Yes  Follow up appointments reviewed: PCP Follow-up appointment confirmed?: Yes Date of PCP follow-up appointment?: 05/21/24 Follow-up Provider: [facility] Follow-up appointment confirmed?: Yes Date of Specialist follow-up appointment?: 05/27/24 Follow-Up Specialty Provider:: ortho Do you need transportation to your follow-up appointment?: No Do you understand care options if your condition(s) worsen?: Yes-patient verbalized understanding    SIGNATURE Julian Lemmings, LPN [facility] Nurse Health Advisor Direct Dial [phone]  "

## 2024-05-21 ENCOUNTER — Inpatient Hospital Stay: Admitting: Family Medicine

## 2024-05-24 ENCOUNTER — Telehealth: Payer: Self-pay | Admitting: Cardiovascular Disease

## 2024-05-24 ENCOUNTER — Encounter: Payer: Self-pay | Admitting: Cardiovascular Disease

## 2024-05-24 NOTE — Telephone Encounter (Signed)
 Attempted to contact patient 3 times appointment per recall with no response from patient, recall expungement letter sent via MyChart, recall deleted

## 2024-05-26 ENCOUNTER — Ambulatory Visit (HOSPITAL_COMMUNITY)

## 2024-05-26 ENCOUNTER — Ambulatory Visit

## 2024-05-27 ENCOUNTER — Telehealth: Payer: Self-pay

## 2024-05-27 NOTE — Telephone Encounter (Signed)
 Pt needs PA for ozempic.

## 2024-05-28 ENCOUNTER — Telehealth: Payer: Self-pay

## 2024-05-28 ENCOUNTER — Other Ambulatory Visit (HOSPITAL_COMMUNITY): Payer: Self-pay

## 2024-05-28 NOTE — Telephone Encounter (Signed)
 Pharmacy Patient Advocate Encounter   Received notification from Pt Calls Messages that prior authorization for Ozemoic 2mg /DOSE (8mg /42ml) is required/requested.   Insurance verification completed.   The patient is insured through [facility].   Per test claim: PA required; PA submitted to above mentioned insurance via Prompt PA Key/confirmation #/EOC [id number] Status is pending

## 2024-05-31 NOTE — Progress Notes (Signed)
 Surgery orders requested via Epic inbox.

## 2024-06-04 NOTE — Patient Instructions (Signed)
 SURGICAL WAITING ROOM VISITATION Patients having surgery or a procedure may have no more than 2 support people in the waiting area - these visitors may rotate in the visitor waiting room.   Due to an increase in RSV and influenza rates and associated hospitalizations, children ages 54 and under may not visit patients in [facility] hospitals. If the patient needs to stay at the hospital during part of their recovery, the visitor guidelines for inpatient rooms apply.  PRE-OP VISITATION  Pre-op nurse will coordinate an appropriate time for 1 support person to accompany the patient in pre-op.  This support person may not rotate.  This visitor will be contacted when the time is appropriate for the visitor to come back in the pre-op area.  Please refer to the [facility] website for the visitor guidelines for Inpatients (after your surgery is over and you are in a regular room).  You are not required to quarantine at this time prior to your surgery. However, you must do this: Hand Hygiene often Do NOT share personal items Notify your provider if you are in close contact with someone who has COVID or you develop fever 100.4 or greater, new onset of sneezing, cough, sore throat, shortness of breath or body aches.  If you test positive for Covid or have been in contact with anyone that has tested positive in the last 10 days please notify you surgeon.    Your procedure is scheduled on: 06/16/24  Report to [facility] Main Entrance: [place] entrance where the [employer] is available.   Report to admitting at: 1:20 PM  Call this number if you have any questions or problems the morning of surgery [phone]  FOLLOW ANY ADDITIONAL PRE OP INSTRUCTIONS YOU RECEIVED FROM YOUR SURGEON'S OFFICE!!!  Do not eat food after Midnight the night prior to your surgery/procedure.  After Midnight you may have the following liquids until: 12:45 PM DAY OF SURGERY  Clear Liquid Diet Water Black  Coffee (sugar ok, NO MILK/CREAM OR CREAMERS)  Tea (sugar ok, NO MILK/CREAM OR CREAMERS) regular and decaf                             Plain Jell-O  with no fruit (NO RED)                                           Fruit ices (not with fruit pulp, NO RED)                                     Popsicles (NO RED)                                                                  Juice: NO CITRUS JUICES: only apple, WHITE grape, WHITE cranberry Sports drinks like Gatorade or Powerade (NO RED)   Oral Hygiene is also important to reduce your risk of infection.        Remember - BRUSH YOUR TEETH THE MORNING OF SURGERY WITH YOUR REGULAR TOOTHPASTE  Do NOT smoke after Midnight the night before surgery.  STOP TAKING all Vitamins, Herbs and supplements 1 week before your surgery.   Take ONLY these medicines the morning of surgery with A SIP OF WATER: NONE.   Before surgery.Stop taking ___________on __________as instructed by _____________.  Stop taking ____________as directed by your Surgeon/Cardiologist.  Contact your Surgeon/Cardiologist for instructions on Anticoagulant Therapy prior to surgery.  How to Manage Your Diabetes Before and After Surgery  Why is it important to control my blood sugar before and after surgery? Improving blood sugar levels before and after surgery helps healing and can limit problems. A way of improving blood sugar control is eating a healthy diet by:  Eating less sugar and carbohydrates  Increasing activity/exercise  Talking with your doctor about reaching your blood sugar goals High blood sugars (greater than 180 mg/dL) can raise your risk of infections and slow your recovery, so you will need to focus on controlling your diabetes during the weeks before surgery. Make sure that the doctor who takes care of your diabetes knows about your planned surgery including the date and location.  How do I manage my blood sugar before surgery? Check your blood sugar at least 4  times a day, starting 2 days before surgery, to make sure that the level is not too high or low. Check your blood sugar the morning of your surgery when you wake up and every 2 hours until you get to the Short Stay unit. If your blood sugar is less than 70 mg/dL, you will need to treat for low blood sugar: Do not take insulin . Treat a low blood sugar (less than 70 mg/dL) with  cup of clear juice (cranberry or apple), 4 glucose tablets, OR glucose gel. Recheck blood sugar in 15 minutes after treatment (to make sure it is greater than 70 mg/dL). If your blood sugar is not greater than 70 mg/dL on recheck, call [phone] for further instructions. Report your blood sugar to the short stay nurse when you get to Short Stay.  If you are admitted to the hospital after surgery: Your blood sugar will be checked by the staff and you will probably be given insulin  after surgery (instead of oral diabetes medicines) to make sure you have good blood sugar levels. The goal for blood sugar control after surgery is 80-180 mg/dL.   WHAT DO I DO ABOUT MY DIABETES MEDICATION?      THE MORNING OF SURGERY, take ONLY half of lantus  insulin  dose (20 units)    DO NOT TAKE THE FOLLOWING 7 DAYS PRIOR TO SURGERY: Ozempic , Wegovy , Rybelsus  (Semaglutide ), Byetta (exenatide), Bydureon (exenatide ER), Victoza, Saxenda (liraglutide), or Trulicity (dulaglutide) Mounjaro (Tirzepatide) Adlyxin (Lixisenatide), Polyethylene Glycol Loxenatide.HOLD Semaglutide  after:06/08/24                  You may not have any metal on your body including hair pins, jewelry, and body piercing  Do not wear lotions, powders, perfumes / cologne, or deodorant  Men may shave face and neck.  Contacts, Hearing Aids, dentures or bridgework may not be worn into surgery. DENTURES WILL BE REMOVED PRIOR TO SURGERY PLEASE DO NOT APPLY Poly grip OR ADHESIVES!!!  You may bring a small overnight bag with you on the day of surgery, only pack items that are  not valuable. [facility] IS NOT RESPONSIBLE   FOR VALUABLES THAT ARE LOST OR STOLEN.   Patients discharged on the day of surgery will not be allowed to drive home.  Someone NEEDS to stay with you for the first 24 hours after anesthesia.  Do not bring your home medications to the hospital. The Pharmacy will dispense medications listed on your medication list to you during your admission in the Hospital.  Special Instructions: Bring a copy of your healthcare power of attorney and living will documents the day of surgery, if you wish to have them scanned into your [facility] Records- EPIC  Please read over the following fact sheets you were given: IF YOU HAVE QUESTIONS ABOUT YOUR PRE-OP INSTRUCTIONS, PLEASE CALL [phone]  [facility]- Preparing for Total Shoulder Arthroplasty    Before surgery, you can play an important role. Because skin is not sterile, your skin needs to be as free of germs as possible. You can reduce the number of germs on your skin by using the following products. Benzoyl Peroxide Gel Reduces the number of germs present on the skin Applied twice a day to shoulder area starting two days before surgery    ==================================================================  Please follow these instructions carefully:  BENZOYL PEROXIDE 5% GEL  Please do not use if you have an allergy to benzoyl peroxide.   If your skin becomes reddened/irritated stop using the benzoyl peroxide.  Starting two days before surgery, apply as follows: Apply benzoyl peroxide in the morning and at night. Apply after taking a shower. If you are not taking a shower clean entire shoulder front, back, and side along with the armpit with a clean wet washcloth.  Place a quarter-sized dollop on your shoulder and rub in thoroughly, making sure to cover the front, back, and side of your shoulder, along with the armpit.   2 days before ____ AM   ____ PM              1 day before ____ AM   ____  PM                         Do this twice a day for two days.  (Last application is the night before surgery, AFTER using the CHG soap as described below).  Do NOT apply benzoyl peroxide gel on the day of surgery. PATIENT SIGNATURE_________________________________  NURSE SIGNATURE__________________________________  ________________________________________________________________________  Pre-operative 4 CHG Bath Instructions  DYNA-Hex 4 Chlorhexidine  Gluconate 4% Solution Antiseptic 4 fl. oz   You can play a key role in reducing the risk of infection after surgery. Your skin needs to be as free of germs as possible. You can reduce the number of germs on your skin by washing with CHG (chlorhexidine  gluconate) soap before surgery. CHG is an antiseptic soap that kills germs and continues to kill germs even after washing.   DO NOT use if you have an allergy to chlorhexidine /CHG or antibacterial soaps. If your skin becomes reddened or irritated, stop using the CHG and notify one of our RNs at   Please shower with the CHG soap starting 4 days before surgery using the following schedule:     Please keep in mind the following:  DO NOT shave, including legs and underarms, starting the day of your first shower.   You may shave your face at any point before/day of surgery.  Place clean sheets on your bed the day you start using CHG soap. Use a clean washcloth (not used since being washed) for each shower. DO NOT sleep with pets once you start using the CHG.  CHG Shower Instructions:  If you choose to wash your  hair and private area, wash first with your normal shampoo/soap.  After you use shampoo/soap, rinse your hair and body thoroughly to remove shampoo/soap residue.  Turn the water OFF and apply about 3 tablespoons (45 ml) of CHG soap to a CLEAN washcloth.  Apply CHG soap ONLY FROM YOUR NECK DOWN TO YOUR TOES (washing for 3-5 minutes)  DO NOT use CHG soap on face, private areas, open wounds,  or sores.  Pay special attention to the area where your surgery is being performed.  If you are having back surgery, having someone wash your back for you may be helpful. Wait 2 minutes after CHG soap is applied, then you may rinse off the CHG soap.  Pat dry with a clean towel  Put on clean clothes/pajamas   If you choose to wear lotion, please use ONLY the CHG-compatible lotions on the back of this paper.     Additional instructions for the day of surgery: DO NOT APPLY any lotions, deodorants, cologne, or perfumes.   Put on clean/comfortable clothes.  Brush your teeth.  Ask your nurse before applying any prescription medications to the skin.   CHG Compatible Lotions   Aveeno Moisturizing lotion  Cetaphil Moisturizing Cream  Cetaphil Moisturizing Lotion  Clairol Herbal Essence Moisturizing Lotion, Dry Skin  Clairol Herbal Essence Moisturizing Lotion, Extra Dry Skin  Clairol Herbal Essence Moisturizing Lotion, Normal Skin  Curel Age Defying Therapeutic Moisturizing Lotion with Alpha Hydroxy  Curel Extreme Care Body Lotion  Curel Soothing Hands Moisturizing Hand Lotion  Curel Therapeutic Moisturizing Cream, Fragrance-Free  Curel Therapeutic Moisturizing Lotion, Fragrance-Free  Curel Therapeutic Moisturizing Lotion, Original Formula  Eucerin Daily Replenishing Lotion  Eucerin Dry Skin Therapy Plus Alpha Hydroxy Crme  Eucerin Dry Skin Therapy Plus Alpha Hydroxy Lotion  Eucerin Original Crme  Eucerin Original Lotion  Eucerin Plus Crme Eucerin Plus Lotion  Eucerin TriLipid Replenishing Lotion  Keri Anti-Bacterial Hand Lotion  Keri Deep Conditioning Original Lotion Dry Skin Formula Softly Scented  Keri Deep Conditioning Original Lotion, Fragrance Free Sensitive Skin Formula  Keri Lotion Fast Absorbing Fragrance Free Sensitive Skin Formula  Keri Lotion Fast Absorbing Softly Scented Dry Skin Formula  Keri Original Lotion  Keri Skin Renewal Lotion Keri Silky Smooth Lotion  Keri  Silky Smooth Sensitive Skin Lotion  Nivea Body Creamy Conditioning Oil  Nivea Body Extra Enriched Lotion  Nivea Body Original Lotion  Nivea Body Sheer Moisturizing Lotion Nivea Crme  Nivea Skin Firming Lotion  NutraDerm 30 Skin Lotion  NutraDerm Skin Lotion  NutraDerm Therapeutic Skin Cream  NutraDerm Therapeutic Skin Lotion  ProShield Protective Hand Cream  Provon moisturizing lotion  Incentive Spirometer  An incentive spirometer is a tool that can help keep your lungs clear and active. This tool measures how well you are filling your lungs with each breath. Taking long deep breaths may help reverse or decrease the chance of developing breathing (pulmonary) problems (especially infection) following: A long period of time when you are unable to move or be active. BEFORE THE PROCEDURE  If the spirometer includes an indicator to show your best effort, your nurse or respiratory therapist will set it to a desired goal. If possible, sit up straight or lean slightly forward. Try not to slouch. Hold the incentive spirometer in an upright position. INSTRUCTIONS FOR USE  Sit on the edge of your bed if possible, or sit up as far as you can in bed or on a chair. Hold the incentive spirometer in an upright position. Breathe out normally.  Place the mouthpiece in your mouth and seal your lips tightly around it. Breathe in slowly and as deeply as possible, raising the piston or the ball toward the top of the column. Hold your breath for 3-5 seconds or for as long as possible. Allow the piston or ball to fall to the bottom of the column. Remove the mouthpiece from your mouth and breathe out normally. Rest for a few seconds and repeat Steps 1 through 7 at least 10 times every 1-2 hours when you are awake. Take your time and take a few normal breaths between deep breaths. The spirometer may include an indicator to show your best effort. Use the indicator as a goal to work toward during each  repetition. After each set of 10 deep breaths, practice coughing to be sure your lungs are clear. If you have an incision (the cut made at the time of surgery), support your incision when coughing by placing a pillow or rolled up towels firmly against it. Once you are able to get out of bed, walk around indoors and cough well. You may stop using the incentive spirometer when instructed by your caregiver.  RISKS AND COMPLICATIONS Take your time so you do not get dizzy or light-headed. If you are in pain, you may need to take or ask for pain medication before doing incentive spirometry. It is harder to take a deep breath if you are having pain. AFTER USE Rest and breathe slowly and easily. It can be helpful to keep track of a log of your progress. Your caregiver can provide you with a simple table to help with this. If you are using the spirometer at home, follow these instructions: SEEK MEDICAL CARE IF:  You are having difficultly using the spirometer. You have trouble using the spirometer as often as instructed. Your pain medication is not giving enough relief while using the spirometer. You develop fever of 100.5 F (38.1 C) or higher. SEEK IMMEDIATE MEDICAL CARE IF:  You cough up bloody sputum that had not been present before. You develop fever of 102 F (38.9 C) or greater. You develop worsening pain at or near the incision site. MAKE SURE YOU:  Understand these instructions. Will watch your condition. Will get help right away if you are not doing well or get worse. Document Released: 09/16/2006 Document Revised: 07/29/2011 Document Reviewed: 11/17/2006 [facility] Patient Information 2014 [place], [place].   ________________________________________________________________________

## 2024-06-07 ENCOUNTER — Encounter (HOSPITAL_COMMUNITY): Payer: Self-pay

## 2024-06-07 ENCOUNTER — Other Ambulatory Visit: Payer: Self-pay

## 2024-06-07 ENCOUNTER — Other Ambulatory Visit (HOSPITAL_COMMUNITY): Payer: Self-pay

## 2024-06-07 ENCOUNTER — Ambulatory Visit: Admitting: "Endocrinology

## 2024-06-07 ENCOUNTER — Encounter: Payer: Self-pay | Admitting: "Endocrinology

## 2024-06-07 ENCOUNTER — Encounter (HOSPITAL_COMMUNITY)
Admission: RE | Admit: 2024-06-07 | Discharge: 2024-06-07 | Disposition: A | Source: Ambulatory Visit | Attending: Orthopaedic Surgery

## 2024-06-07 VITALS — BP 130/80 | Ht 66.0 in | Wt 207.0 lb

## 2024-06-07 VITALS — BP 150/85 | HR 81 | Temp 97.8°F | Ht 66.0 in | Wt 207.0 lb

## 2024-06-07 DIAGNOSIS — K279 Peptic ulcer, site unspecified, unspecified as acute or chronic, without hemorrhage or perforation: Secondary | ICD-10-CM | POA: Diagnosis not present

## 2024-06-07 DIAGNOSIS — I739 Peripheral vascular disease, unspecified: Secondary | ICD-10-CM | POA: Insufficient documentation

## 2024-06-07 DIAGNOSIS — Z794 Long term (current) use of insulin: Secondary | ICD-10-CM | POA: Insufficient documentation

## 2024-06-07 DIAGNOSIS — E7800 Pure hypercholesterolemia, unspecified: Secondary | ICD-10-CM | POA: Diagnosis not present

## 2024-06-07 DIAGNOSIS — S4291XA Fracture of right shoulder girdle, part unspecified, initial encounter for closed fracture: Secondary | ICD-10-CM | POA: Insufficient documentation

## 2024-06-07 DIAGNOSIS — E1165 Type 2 diabetes mellitus with hyperglycemia: Secondary | ICD-10-CM | POA: Diagnosis not present

## 2024-06-07 DIAGNOSIS — K7460 Unspecified cirrhosis of liver: Secondary | ICD-10-CM | POA: Diagnosis not present

## 2024-06-07 DIAGNOSIS — Z01810 Encounter for preprocedural cardiovascular examination: Secondary | ICD-10-CM | POA: Diagnosis present

## 2024-06-07 DIAGNOSIS — Z01818 Encounter for other preprocedural examination: Secondary | ICD-10-CM | POA: Diagnosis not present

## 2024-06-07 DIAGNOSIS — F1011 Alcohol abuse, in remission: Secondary | ICD-10-CM | POA: Diagnosis not present

## 2024-06-07 DIAGNOSIS — Z95820 Peripheral vascular angioplasty status with implants and grafts: Secondary | ICD-10-CM | POA: Diagnosis not present

## 2024-06-07 DIAGNOSIS — E1140 Type 2 diabetes mellitus with diabetic neuropathy, unspecified: Secondary | ICD-10-CM | POA: Diagnosis not present

## 2024-06-07 DIAGNOSIS — I10 Essential (primary) hypertension: Secondary | ICD-10-CM | POA: Diagnosis not present

## 2024-06-07 DIAGNOSIS — E1169 Type 2 diabetes mellitus with other specified complication: Secondary | ICD-10-CM

## 2024-06-07 DIAGNOSIS — E669 Obesity, unspecified: Secondary | ICD-10-CM | POA: Diagnosis not present

## 2024-06-07 DIAGNOSIS — Z87891 Personal history of nicotine dependence: Secondary | ICD-10-CM | POA: Insufficient documentation

## 2024-06-07 DIAGNOSIS — Z7985 Long-term (current) use of injectable non-insulin antidiabetic drugs: Secondary | ICD-10-CM | POA: Diagnosis not present

## 2024-06-07 DIAGNOSIS — W19XXXA Unspecified fall, initial encounter: Secondary | ICD-10-CM | POA: Diagnosis not present

## 2024-06-07 DIAGNOSIS — Z01812 Encounter for preprocedural laboratory examination: Secondary | ICD-10-CM | POA: Diagnosis present

## 2024-06-07 LAB — SURGICAL PCR SCREEN
MRSA, PCR: NEGATIVE
Staphylococcus aureus: NEGATIVE

## 2024-06-07 LAB — POCT GLYCOSYLATED HEMOGLOBIN (HGB A1C): Hemoglobin A1C: 8.1 % — AB (ref 4.0–5.6)

## 2024-06-07 LAB — GLUCOSE, CAPILLARY: Glucose-Capillary: 221 mg/dL — ABNORMAL HIGH (ref 70–99)

## 2024-06-07 MED ORDER — SEMAGLUTIDE (2 MG/DOSE) 8 MG/3ML ~~LOC~~ SOPN: 2.0000 mg | PEN_INJECTOR | SUBCUTANEOUS | 3 refills | Status: AC

## 2024-06-07 NOTE — Telephone Encounter (Signed)
 Pharmacy Patient Advocate Encounter  Received notification from [employer] that Prior Authorization for Ozempic  2mg /DOSE (8mg /44ml)  has been APPROVED from 06/04/24 to 06/03/25. Ran test claim, Copay is $45.00. This test claim was processed through [facility]- copay amounts may vary at other pharmacies due to pharmacy/plan contracts, or as the patient moves through the different stages of their insurance plan.   PA #/Case ID/Reference #: [id number]

## 2024-06-07 NOTE — Progress Notes (Signed)
 A1C: 8.1: from endocrinologist lab work.Unable to do labs at PST,pt. was stick 3 times unsuccessfully.

## 2024-06-07 NOTE — Progress Notes (Signed)
 For Anesthesia: PCP - Tower, Laine LABOR, MD  Cardiologist -Darron Deatrice LABOR, MD . LOV: 02/25/23  Bowel Prep reminder:  Chest x-ray -  EKG -  Stress Test -  ECHO -  Cardiac Cath -  Pacemaker/ICD device last checked: Pacemaker orders received: Device Rep notified:  Spinal Cord Stimulator:N/A  Sleep Study - NO CPAP - NO  Fasting Blood Sugar - 150's - 180's Checks Blood Sugar : Dexcom continuous monitor Date and result of last Hgb A1c-  Last dose of GLP1 agonist- Semaglutide : Last dose 05/31/24 GLP1 instructions: Hold 7 days prior to schedule (Hold 24 hours-daily)   Last dose of SGLT-2 inhibitors- N/A SGLT-2 instructions: Hold 72 hours prior to surgery  Blood Thinner Instructions: Plavix : will be hold after: 06/10/24 Last Dose: Time last taken:  Aspirin  Instructions: Last Dose: Time last taken:  Activity level: Unable to go up a flight of stairs due to left leg surgery    Anesthesia review: Hx: essential hypertension, type 2 diabetes, hyperlipidemia, 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   Patient denies shortness of breath, fever, cough and chest pain at PAT appointment   Patient verbalized understanding of instructions that were reviewed over the telephone.

## 2024-06-07 NOTE — Progress Notes (Signed)
 "   Outpatient Endocrinology Note Obadiah Birmingham, MD  06/07/24   Zachary Lewis 1960-12-13 [id number]  Referring Provider: Randeen Laine LABOR, MD Primary Care Provider: Randeen Laine LABOR, MD Reason for consultation: Subjective   Assessment & Plan  Diagnoses and all orders for this visit:  Uncontrolled type 2 diabetes mellitus with hyperglycemia (HCC) -     POCT glycosylated hemoglobin (Hb A1C)  Other orders -     Semaglutide , 2 MG/DOSE, 8 MG/3ML SOPN; Inject 2 mg as directed once a week.   Diabetes complicated by neuropathy Hba1c goal less than 7.0, last Hba1c was Lab Results  Component Value Date   HGBA1C 8.1 (A) 06/07/2024   HGBA1C 8.6 (A) 12/16/2023   HGBA1C 8.4 (A) 11/05/2023   Will recommend for the following change of medications to: Lantus  46-50 units once every morning Ozempic  2 mg/wk On dexcom  Prior diarrhea with metformin  Not interested in synjardy or Metformin  XR  No known contraindications to any of above medications No history of MEN syndrome/medullary thyroid  cancer/pancreatitis or pancreatic cancer in self or family  Hyperlipidemia -04/2023: Last LDL improved from 150 to 38 -not on statin -On repatha  140 mg every 2 wks, stopped ezetimibe  10 mg every day but pt is not sure if he is still taking it   -Follow low fat diet and exercise   -Blood pressure goal <140/90 - Microalbumin/creatinine at goal < 30 three yrs ago-did not do lab, reordered  -not on ACE/ARB  -diet changes including salt restriction -limit eating outside -counseled BP targets per standards of diabetes care -Uncontrolled blood pressure can lead to retinopathy, nephropathy and cardiovascular and atherosclerotic heart disease  Reviewed and counseled on: -A1C target -Blood sugar targets -Complications of uncontrolled diabetes  -Checking blood sugar before meals and bedtime and bring log next visit -All medications with mechanism of action and side effects -Hypoglycemia management:  rule of 15's, Glucagon Emergency Kit and medical alert ID -low-carb low-fat plate-method diet -At least 20 minutes of physical activity per day -Annual dilated retinal eye exam and foot exam -compliance and follow up needs -follow up as scheduled or earlier if problem gets worse  Call if blood sugar is less than 70 or consistently above 250    Take a 15 gm snack of carbohydrate at bedtime before you go to sleep if your blood sugar is less than 100.    If you are going to fast after midnight for a test or procedure, ask your physician for instructions on how to reduce/decrease your insulin  dose.    Call if blood sugar is less than 70 or consistently above 250  -Treating a low sugar by rule of 15  (15 gms of sugar every 15 min until sugar is more than 70) If you feel your sugar is low, test your sugar to be sure If your sugar is low (less than 70), then take 15 grams of a fast acting Carbohydrate (3-4 glucose tablets or glucose gel or 4 ounces of juice or regular soda) Recheck your sugar 15 min after treating low to make sure it is more than 70 If sugar is still less than 70, treat again with 15 grams of carbohydrate          Don't drive the hour of hypoglycemia  If unconscious/unable to eat or drink by mouth, use glucagon injection or nasal spray baqsimi and call 911. Can repeat again in 15 min if still unconscious.  No follow-ups on file.   I have reviewed  current medications, nurse's notes, allergies, vital signs, past medical and surgical history, family medical history, and social history for this encounter. Counseled patient on symptoms, examination findings, lab findings, imaging results, treatment decisions and monitoring and prognosis. The patient understood the recommendations and agrees with the treatment plan. All questions regarding treatment plan were fully answered.  Obadiah Birmingham, MD  06/07/24   History of Present Illness JARY LOUVIER is a 64 y.o. year old male who  presents for follow up of Type 2 diabetes mellitus.  DRELYN PISTILLI was first diagnosed in 2010.   Diabetes education +  Home diabetes regimen: Lantus  42 units once every morning Ozempic  2 mg/wk-missed some   Took rybelsus  in the past  COMPLICATIONS -  MI/Stroke -  retinopathy +  neuropathy -  nephropathy  BLOOD SUGAR DATA CGM interpretation: At today's visit, we reviewed her CGM downloads. The full report is scanned in the media. Reviewing the CGM trends, BG are elevated almost entirely across the day 71%.  Physical Exam  BP 130/80   Ht 5' 6 (1.676 m)   Wt 207 lb (93.9 kg)   BMI 33.41 kg/m    Constitutional: well developed, well nourished Head: normocephalic, atraumatic Eyes: sclera anicteric, no redness Neck: supple Lungs: normal respiratory effort Neurology: alert and oriented Skin: dry, no appreciable rashes Musculoskeletal: no appreciable defects Psychiatric: normal mood and affect Diabetic Foot Exam - Simple   No data filed      Current Medications Patient's Medications  New Prescriptions   No medications on file  Previous Medications   ASPIRIN  EC 81 MG TABLET    Take 1 tablet (81 mg total) by mouth daily. Swallow whole.   CLOPIDOGREL  (PLAVIX ) 75 MG TABLET    TAKE 1 TABLET(75 MG) BY MOUTH DAILY   CLOTRIMAZOLE -BETAMETHASONE  (LOTRISONE ) CREAM    Apply 1 Application topically daily.   CONTINUOUS GLUCOSE SENSOR (DEXCOM G7 SENSOR) MISC    1 Device by Does not apply route continuous.   CYCLOBENZAPRINE  (FLEXERIL ) 10 MG TABLET    Take 1 tablet (10 mg total) by mouth 3 (three) times daily as needed for muscle spasms.   DOCUSATE SODIUM  (COLACE) 100 MG CAPSULE    Take 1 capsule (100 mg total) by mouth 2 (two) times daily.   GLUCOSE BLOOD (ONETOUCH VERIO) TEST STRIP    1 each by Other route daily. And lancets 1/day   HYDROCODONE -ACETAMINOPHEN  (NORCO/VICODIN) 5-325 MG TABLET    Take 1 tablet by mouth every 6 (six) hours as needed (pain).   INSULIN  ASPART (NOVOLOG )  100 UNIT/ML FLEXPEN    Inject 0-5 Units into the skin at bedtime.   INSULIN  GLARGINE (LANTUS  SOLOSTAR) 100 UNIT/ML SOLOSTAR PEN    Inject 40 Units into the skin daily.   INSULIN  PEN NEEDLE (PEN NEEDLES) 32G X 4 MM MISC    1 Device by Does not apply route in the morning, at noon, in the evening, and at bedtime.   INSULIN  PEN NEEDLE 31G X 8 MM MISC    Inject 1 Needle into the skin daily at 6 (six) AM.   LANCETS (ONETOUCH DELICA PLUS LANCET33G) MISC    USE AS DIRECTED IN THE MORNING, AT NOON, AND AT BEDTIME   LIDOCAINE  (LIDODERM ) 5 %    Place 1 patch onto the skin daily. Remove & Discard patch within 12 hours or as directed by MD   POLYETHYLENE GLYCOL (MIRALAX  / GLYCOLAX ) 17 G PACKET    Take 17 g by mouth daily as needed  for mild constipation.   REPATHA  SURECLICK 140 MG/ML SOAJ    ADMINISTER 1 ML UNDER THE SKIN EVERY 14 DAYS   SPIRONOLACTONE  (ALDACTONE ) 50 MG TABLET    TAKE 1 TABLET(50 MG) BY MOUTH DAILY   TADALAFIL  (CIALIS ) 20 MG TABLET    Take 1 tablet (20 mg total) by mouth daily as needed.   THIAMINE  (VITAMIN B-1) 100 MG TABLET    Take 100 mg by mouth in the morning.  Modified Medications   Modified Medication Previous Medication   SEMAGLUTIDE , 2 MG/DOSE, 8 MG/3ML SOPN Semaglutide , 2 MG/DOSE, 8 MG/3ML SOPN      Inject 2 mg as directed once a week.    Inject 2 mg as directed once a week.  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) 08/04/2018   Diabetes mellitus    Type II   Hyperlipidemia    Hypertension    NSAID-induced gastric ulcer 12/13/2018   EGD 11/2018 H pylori neg Omeprazole  started   Obesity    Peripheral arterial disease    Personal history of colonic adenomas 03/09/2013   Tinea pedis    Chronic    Past Surgical History Past Surgical History:  Procedure Laterality Date    ABDOMINAL AORTOGRAM W/LOWER EXTREMITY N/A 04/15/2023   Procedure: ABDOMINAL AORTOGRAM W/LOWER EXTREMITY;  Surgeon: Pearline Norman RAMAN, MD;  [facility]: [facility];  Service: Cardiovascular;  Laterality: N/A;   BYPASS GRAFT FEMORAL-PERONEAL Left 04/18/2023   Procedure: LEFT FEMORAL-PERONEAL ARTERY BYPASS;  Surgeon: Sheree Penne Bruckner, MD;  Location: [facility] OR;  Service: Vascular;  Laterality: Left;   COLONOSCOPY     COLONOSCOPY W/ POLYPECTOMY  2014   IR PARACENTESIS  07/02/2018   IR TRANSCATHETER BX  07/17/2018   IR VENOGRAM HEPATIC WO HEMODYNAMIC EVALUATION  07/17/2018   myringectomy  06/2005   POLYPECTOMY     QUADRICEPS TENDON REPAIR Left 04/21/2024   Procedure: REPAIR, TENDON, QUADRICEPS;  Surgeon: Reyne Cordella SQUIBB, MD;  Location: [facility];  Service: Orthopedics;  Laterality: Left;   UMBILICAL HERNIA REPAIR  01/1997   VASECTOMY  07/2002   VEIN HARVEST Left 04/18/2023   Procedure: VEIN HARVEST USING GREATER SAPHENOUS VEIN;  Surgeon: Sheree Penne Bruckner, MD;  [facility];  Service: Vascular;  Laterality: Left;    Family History family history includes Hypertension in an other family member.  Social History Social History   Socioeconomic History   Marital status: Married    Spouse name: Not on file   Number of children: 5   Years of education: Not on file   Highest education level: Not on file  Occupational History   Occupation: [employer]    Employer: [employer]   Occupation: [employer]  Tobacco Use   Smoking status: Former    Current packs/day: 0.00    Types: Cigarettes    Quit date: 04/02/2023    Years since quitting: 1.1   Smokeless tobacco: Never  Vaping Use   Vaping status: Never Used  Substance and Sexual Activity   Alcohol  use: Not Currently    Comment: 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   Drug use: No   Sexual activity: Not on file  Other Topics Concern   Not  on file  Social  History Narrative   Married, production designer, 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 [employer]    heavy drinking of [place] mist alcohol  in past   No drug use   Social Drivers of Health   Tobacco Use: Medium Risk (06/07/2024)   Patient History    Smoking Tobacco Use: Former    Smokeless Tobacco Use: Never    Passive Exposure: Not on Actuary Strain: Not on file  Food Insecurity: No Food Insecurity (04/22/2024)   Epic    Worried About Programme Researcher, Broadcasting/film/video in the Last Year: Never true    Ran Out of Food in the Last Year: Never true  Transportation Needs: No Transportation Needs (04/22/2024)   Epic    Lack of Transportation (Medical): No    Lack of Transportation (Non-Medical): No  Physical Activity: Not on file  Stress: Not on file  Social Connections: Not on file  Intimate Partner Violence: Not At Risk (04/22/2024)   Epic    Fear of Current or Ex-Partner: No    Emotionally Abused: No    Physically Abused: No    Sexually Abused: No  Depression (PHQ2-9): Low Risk (06/02/2023)   Depression (PHQ2-9)    PHQ-2 Score: 0  Recent Concern: Depression (PHQ2-9) - Medium Risk (05/12/2023)   Depression (PHQ2-9)    PHQ-2 Score: 8  Alcohol  Screen: Not on file  Housing: Low Risk (04/22/2024)   Epic    Unable to Pay for Housing in the Last Year: No    Number of Times Moved in the Last Year: 0    Homeless in the Last Year: No  Utilities: Not At Risk (04/22/2024)   Epic    Threatened with loss of utilities: No  Health Literacy: Not on file    Lab Results  Component Value Date   HGBA1C 8.1 (A) 06/07/2024   HGBA1C 8.6 (A) 12/16/2023   HGBA1C 8.4 (A) 11/05/2023   Lab Results  Component Value Date   CHOL 166 12/11/2023   Lab Results  Component Value Date   HDL 75 12/11/2023   Lab Results  Component Value Date   LDLCALC 73 12/11/2023   Lab Results  Component Value Date   TRIG 97 12/11/2023   Lab Results  Component Value Date   CHOLHDL 2.2 12/11/2023   Lab Results  Component Value Date    CREATININE 0.69 04/30/2024   Lab Results  Component Value Date   GFR 97.50 05/12/2023   Lab Results  Component Value Date   MICROALBUR 2.3 12/16/2023      Component Value Date/Time   NA 133 (L) 04/30/2024 0325   K 4.0 04/30/2024 0325   CL 100 04/30/2024 0325   CO2 22 04/30/2024 0325   GLUCOSE 144 (H) 04/30/2024 0325   BUN 19 04/30/2024 0325   CREATININE 0.69 04/30/2024 0325   CREATININE 0.75 12/11/2023 1135   CALCIUM 9.0 04/30/2024 0325   PROT 7.1 04/22/2024 0518   ALBUMIN 4.0 04/22/2024 0518   AST 43 (H) 04/22/2024 0518   ALT 42 04/22/2024 0518   ALKPHOS 123 04/22/2024 0518   BILITOT 0.4 04/22/2024 0518   GFRNONAA >60 04/30/2024 0325   GFRAA >60 06/01/2018 0427      Latest Ref Rng & Units 04/30/2024    3:25 AM 04/26/2024    3:29 AM 04/24/2024    6:13 AM  BMP  Glucose 70 - 99 mg/dL 855  818  791   BUN 8 - 23 mg/dL 19  17  15   Creatinine 0.61 - 1.24 mg/dL 9.30  9.32  9.29   Sodium 135 - 145 mmol/L 133  134  135   Potassium 3.5 - 5.1 mmol/L 4.0  4.7  4.4   Chloride 98 - 111 mmol/L 100  100  98   CO2 22 - 32 mmol/L 22  22  25    Calcium 8.9 - 10.3 mg/dL 9.0  9.9  9.7        Component Value Date/Time   WBC 9.0 04/30/2024 0325   RBC 3.68 (L) 04/30/2024 0325   HGB 12.6 (L) 04/30/2024 0325   HCT 37.2 (L) 04/30/2024 0325   PLT 365 04/30/2024 0325   MCV 101.1 (H) 04/30/2024 0325   MCH 34.2 (H) 04/30/2024 0325   MCHC 33.9 04/30/2024 0325   RDW 13.1 04/30/2024 0325   LYMPHSABS 1.6 04/20/2024 1947   MONOABS 0.8 04/20/2024 1947   EOSABS 0.1 04/20/2024 1947   BASOSABS 0.0 04/20/2024 1947     Parts of this note may have been dictated using voice recognition software. There may be variances in spelling and vocabulary which are unintentional. Not all errors are proofread. Please notify the dino if any discrepancies are noted or if the meaning of any statement is not clear.   "

## 2024-06-07 NOTE — Patient Instructions (Addendum)
 Will recommend for the following change of medications to: Lantus  46-50 units once every morning Ozempic  2 mg/wk  ____________    Goals of DM therapy:  Morning Fasting blood sugar: 80-140  Blood sugar before meals: 80-140 Bed time blood sugar: 100-150  A1C <7%, limited only by hypoglycemia  1.Diabetes medications and their side effects discussed, including hypoglycemia    2. Check blood glucose:  a) Always check blood sugars before driving. Please see below (under hypoglycemia) on how to manage b) Check a minimum of 3 times/day or more as needed when having symptoms of hypoglycemia.   c) Try to check blood glucose before sleeping/in the middle of the night to ensure that it is remaining stable and not dropping less than 100 d) Check blood glucose more often if sick  3. Diet: a) 3 meals per day schedule b: Restrict carbs to 60-70 grams (4 servings) per meal c) Colorful vegetables - 3 servings a day, and low sugar fruit 2 servings/day Plate control method: 1/4 plate protein, 1/4 starch, 1/2 green, yellow, or red vegetables d) Avoid carbohydrate snacks unless hypoglycemic episode, or increased physical activity  4. Regular exercise as tolerated, preferably 3 or more hours a week  5. Hypoglycemia: a)  Do not drive or operate machinery without first testing blood glucose to assure it is over 90 mg%, or if dizzy, lightheaded, not feeling normal, etc, or  if foot or leg is numb or weak. b)  If blood glucose less than 70, take four 5gm Glucose tabs or 15-30 gm Glucose gel.  Repeat every 15 min as needed until blood sugar is >100 mg/dl. If hypoglycemia persists then call 911.   6. Sick day management: a) Check blood glucose more often b) Continue usual therapy if blood sugars are elevated.   7. Contact the doctor immediately if blood glucose is frequently <60 mg/dl, or an episode of severe hypoglycemia occurs (where someone had to give you glucose/  glucagon or if you passed out  from a low blood glucose), or if blood glucose is persistently >350 mg/dl, for further management  8. A change in level of physical activity or exercise and a change in diet may also affect your blood sugar. Check blood sugars more often and call if needed.  Instructions: 1. Bring glucose meter, blood glucose records on every visit for review 2. Continue to follow up with primary care physician and other providers for medical care 3. Yearly eye  and foot exam 4. Please get blood work done prior to the next appointment

## 2024-06-08 ENCOUNTER — Other Ambulatory Visit: Payer: Self-pay | Admitting: Physician Assistant

## 2024-06-08 ENCOUNTER — Encounter: Payer: Self-pay | Admitting: "Endocrinology

## 2024-06-08 DIAGNOSIS — S4291XA Fracture of right shoulder girdle, part unspecified, initial encounter for closed fracture: Secondary | ICD-10-CM

## 2024-06-09 ENCOUNTER — Encounter (HOSPITAL_COMMUNITY): Payer: Self-pay

## 2024-06-09 NOTE — Progress Notes (Signed)
 " Case: [id number] Date/Time: 06/16/24 1533   Procedure: ARTHROPLASTY, SHOULDER, TOTAL, REVERSE (Right: Shoulder)   Anesthesia type: General   Diagnosis: Closed fracture of right shoulder girdle, initial encounter [S42.91XA]   Pre-op diagnosis: Closed fracture of right shoulder girdle   [facility] 09 [facility]   Surgeons: Cristy Bonner DASEN, MD       DISCUSSION: Zachary Lewis is a 64 yo male with PMH of former smoking, PAD s/p L SFA-peroneal artery bypass (03/2023), HTN, PUD, cirrhosis, hx of ETOH abuse, IDDM with neuropathy, obesity.  Patient admitted from 12/2-12/10/25 due to a mechanical fall. He sustained a fracture dislocation of his R shoulder. Also with L quadriceps tendon tear which was repaired while inpatient on 12/3 by Ortho. No hospital complications noted.   Patient follows with vascular for PAD s/p bypass surgery in November 2024.  Last seen by PA Eveland on 11/26/2023.  Denied any claudication symptoms.  He is on DAPT has been advised to continue.  Advised follow-up in 1 year for repeat imaging.  Patient follows with endocrinology for insulin -dependent diabetes which has been uncontrolled.  Last seen on 06/07/2024.  Advised to increase Lantus  dose. Has Dexcom. 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 Last seen by GI in 09/2022. RUQ US  ordered and labs. Has not followed up since then.  LD Plavix : 1/22 LD Ozempic : 1/12  VS: BP (!) 150/85   Pulse 81   Temp 36.6 C (Oral)   Ht 5' 6 (1.676 m)   Wt 93.9 kg   SpO2 100%   BMI 33.41 kg/m   PROVIDERS: Tower, Laine LABOR, MD   LABS: Labs reviewed: Acceptable for surgery. Labs for DOS. Last CMP on 04/22/24 showed normal liver function.  (all labs ordered are listed, but only abnormal results are displayed)  Labs Reviewed  GLUCOSE, CAPILLARY - Abnormal; Notable for the following components:      Result Value   Glucose-Capillary 221 (*)    All other components within normal limits  SURGICAL PCR SCREEN     RUQ US   05/12/2023:  IMPRESSION: 1. Increased hepatic parenchymal echogenicity suggestive of steatosis. Mildly nodular contour suggestive of cirrhosis. 2. No cholelithiasis or sonographic evidence for acute cholecystitis.  EKG 06/07/24:  Normal sinus rhythm Possible inferior infarct, age undetermined   VAS US  lower extremity bypass graft duplex 11/26/2023:  Summary: Left: Patent left femoral-peroneal artery bypass graft without obvious evidence of hemodynamically significant stenosis. 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) 08/04/2018   Diabetes mellitus    Type II   Hyperlipidemia    Hypertension    NSAID-induced gastric ulcer 12/13/2018   EGD 11/2018 H pylori neg Omeprazole  started   Obesity    Peripheral arterial disease    Personal history of colonic adenomas 03/09/2013   Tinea pedis    Chronic    Past Surgical History:  Procedure Laterality Date   ABDOMINAL AORTOGRAM W/LOWER EXTREMITY N/A 04/15/2023   Procedure: ABDOMINAL AORTOGRAM W/LOWER EXTREMITY;  Surgeon: Pearline Norman RAMAN, MD;  Location: [facility];  Service: Cardiovascular;  Laterality: N/A;   BYPASS GRAFT FEMORAL-PERONEAL Left 04/18/2023   Procedure: LEFT FEMORAL-PERONEAL ARTERY BYPASS;  Surgeon: Sheree Penne Bruckner, MD;  Location: [facility] OR;  Service: Vascular;  Laterality: Left;   COLONOSCOPY     COLONOSCOPY W/ POLYPECTOMY  2014   IR PARACENTESIS  07/02/2018   IR TRANSCATHETER  BX  07/17/2018   IR VENOGRAM HEPATIC WO HEMODYNAMIC EVALUATION  07/17/2018   myringectomy  06/2005   POLYPECTOMY     QUADRICEPS TENDON REPAIR Left 04/21/2024   Procedure: REPAIR, TENDON, QUADRICEPS;  Surgeon: Reyne Cordella SQUIBB, MD;  Location: [facility];  Service: Orthopedics;  Laterality: Left;   UMBILICAL HERNIA REPAIR  01/1997   VASECTOMY  07/2002   VEIN HARVEST Left 04/18/2023   Procedure: VEIN HARVEST USING GREATER SAPHENOUS VEIN;   Surgeon: Sheree Penne Bruckner, MD;  Location: [facility] OR;  Service: Vascular;  Laterality: Left;    MEDICATIONS:  aspirin  EC 81 MG tablet   clopidogrel  (PLAVIX ) 75 MG tablet   clotrimazole -betamethasone  (LOTRISONE ) cream   Continuous Glucose Sensor (DEXCOM G7 SENSOR) MISC   cyclobenzaprine  (FLEXERIL ) 10 MG tablet   docusate sodium  (COLACE) 100 MG capsule   glucose blood (ONETOUCH VERIO) test strip   HYDROcodone -acetaminophen  (NORCO/VICODIN) 5-325 MG tablet   insulin  aspart (NOVOLOG ) 100 UNIT/ML FlexPen   insulin  glargine (LANTUS  SOLOSTAR) 100 UNIT/ML Solostar Pen   Insulin  Pen Needle (PEN NEEDLES) 32G X 4 MM MISC   Insulin  Pen Needle 31G X 8 MM MISC   Lancets (ONETOUCH DELICA PLUS LANCET33G) MISC   lidocaine  (LIDODERM ) 5 %   polyethylene glycol (MIRALAX  / GLYCOLAX ) 17 g packet   REPATHA  SURECLICK 140 MG/ML SOAJ   Semaglutide , 2 MG/DOSE, 8 MG/3ML SOPN   spironolactone  (ALDACTONE ) 50 MG tablet   tadalafil  (CIALIS ) 20 MG tablet   thiamine  (VITAMIN B-1) 100 MG tablet   No current facility-administered medications for this encounter.   Burnard CHRISTELLA Odis DEVONNA [facility] Short Stay/Anesthesiology [facility] Phone [phone] 06/09/2024 10:34 AM        "

## 2024-06-09 NOTE — Anesthesia Preprocedure Evaluation (Addendum)
"                                    Anesthesia Evaluation  Patient identified by MRN, date of birth, ID band Patient awake    Reviewed: Allergy & Precautions, H&P , NPO status , Patient's Chart, lab work & pertinent test results  History of Anesthesia Complications Negative for: history of anesthetic complications  Airway Mallampati: III  TM Distance: >3 FB Neck ROM: Full    Dental  (+) Dental Advisory Given, Teeth Intact, Loose   Pulmonary neg sleep apnea, Patient abstained from smoking., former smoker   Pulmonary exam normal breath sounds clear to auscultation       Cardiovascular hypertension, + Peripheral Vascular Disease  Normal cardiovascular exam Rhythm:Regular Rate:Normal     Neuro/Psych neg Seizures negative neurological ROS  negative psych ROS   GI/Hepatic PUD,,,(+) Cirrhosis       NASH   Endo/Other  diabetes, Poorly Controlled, Type 2    Renal/GU negative Renal ROS     Musculoskeletal  (+) Arthritis ,    Abdominal  (+) + obese  Peds  Hematology negative hematology ROS (+)   Anesthesia Other Findings fracture dislocation of his R shoulder.  Reproductive/Obstetrics                              Anesthesia Physical Anesthesia Plan  ASA: 3  Anesthesia Plan: General and Regional   Post-op Pain Management: Tylenol  PO (pre-op)* and Regional block*   Induction: Intravenous  PONV Risk Score and Plan: 3 and Ondansetron , Dexamethasone  and Treatment may vary due to age or medical condition  Airway Management Planned: Oral ETT  Additional Equipment: None  Intra-op Plan:   Post-operative Plan: Extubation in OR  Informed Consent: I have reviewed the patients History and Physical, chart, labs and discussed the procedure including the risks, benefits and alternatives for the proposed anesthesia with the patient or authorized representative who has indicated his/her understanding and acceptance.     Dental  advisory given  Plan Discussed with: CRNA  Anesthesia Plan Comments: (See PAT note from 1/19  Zachary Lewis is a 64 yo male with PMH of former smoking, PAD s/p L SFA-peroneal artery bypass (03/2023), HTN, PUD, cirrhosis, hx of ETOH abuse, IDDM with neuropathy, obesity.   Patient admitted from 12/2-12/10/25 due to a mechanical fall. He sustained a fracture dislocation of his R shoulder. Also with L quadriceps tendon tear which was repaired while inpatient on 12/3 by Ortho. No hospital complications noted.    Patient follows with vascular for PAD s/p bypass surgery in November 2024.  Last seen by PA Eveland on 11/26/2023.  Denied any claudication symptoms.  He is on DAPT has been advised to continue.  Advised follow-up in 1 year for repeat imaging.   Patient follows with endocrinology for insulin -dependent diabetes which has been uncontrolled.  Last seen on 06/07/2024.  Advised to increase Lantus  dose. Has Dexcom. 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 Last seen by GI in 09/2022. RUQ US  ordered and labs. Has not followed up since then.   LD Plavix : 1/22 LD Ozempic : 1/12 )        Anesthesia Quick Evaluation  "

## 2024-06-15 NOTE — H&P (Signed)
 "   PREOPERATIVE H&P  Chief Complaint: Closed fracture of right shoulder girdle  HPI: Zachary Lewis is a 64 y.o. male who is scheduled for Procedures: ARTHROPLASTY, SHOULDER, TOTAL, REVERSE.   Patient is referred to us  from Dr. Reyne. Patient had sustained bilateral quadricep tendon tears. The left was treated surgically with Dr. Reyne. The right one, Dr. Reyne is managing nonoperatively. He also had a right shoulder dislocation with a massive rotator cuff tear. This re- dislocated in the hospital requiring a second close reduction. Due to the extensive nature of his right shoulder injury, Dr. Reyne recommended follow up with a shoulder specialist. Prior to this injury at work, patient has never had any issues with his right shoulder. Of note unrelated to his work injury, he has a history of cuff tear on the left shoulder with limited mobility. He has currently been in a sling on the right shoulder. He is able to mobilize on his lower extremity with a walker. He has braces on his bilateral lower extremity. Knee immobilizer on the right. Bledsoe on the left. He was discharged from the hospital about a week and a half ago. He is accompanied by his wife today.  Symptoms are rated as moderate to severe, and have been worsening.  This is significantly impairing activities of daily living.    Please see clinic note for further details on this patient's care.    He has elected for surgical management.   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) 08/04/2018   Diabetes mellitus    Type II   Hyperlipidemia    Hypertension    NSAID-induced gastric ulcer 12/13/2018   EGD 11/2018 H pylori neg Omeprazole  started   Obesity    Peripheral arterial disease    Personal history of colonic adenomas 03/09/2013   Tinea pedis    Chronic   Past Surgical History:  Procedure Laterality Date   ABDOMINAL  AORTOGRAM W/LOWER EXTREMITY N/A 04/15/2023   Procedure: ABDOMINAL AORTOGRAM W/LOWER EXTREMITY;  Surgeon: Pearline Norman RAMAN, MD;  Location: [facility];  Service: Cardiovascular;  Laterality: N/A;   BYPASS GRAFT FEMORAL-PERONEAL Left 04/18/2023   Procedure: LEFT FEMORAL-PERONEAL ARTERY BYPASS;  Surgeon: Sheree Penne Bruckner, MD;  Location: [facility] OR;  Service: Vascular;  Laterality: Left;   COLONOSCOPY     COLONOSCOPY W/ POLYPECTOMY  2014   IR PARACENTESIS  07/02/2018   IR TRANSCATHETER BX  07/17/2018   IR VENOGRAM HEPATIC WO HEMODYNAMIC EVALUATION  07/17/2018   myringectomy  06/2005   POLYPECTOMY     QUADRICEPS TENDON REPAIR Left 04/21/2024   Procedure: REPAIR, TENDON, QUADRICEPS;  Surgeon: Reyne Cordella SQUIBB, MD;  Location: [facility];  Service: Orthopedics;  Laterality: Left;   UMBILICAL HERNIA REPAIR  01/1997   VASECTOMY  07/2002   VEIN HARVEST Left 04/18/2023   Procedure: VEIN HARVEST USING GREATER SAPHENOUS VEIN;  Surgeon: Sheree Penne Bruckner, MD;  Location: [facility] OR;  Service: Vascular;  Laterality: Left;   Social History   Socioeconomic History   Marital status: Married    Spouse name: Not on file   Number of children: 5   Years of education: Not on file   Highest education level: Not on file  Occupational History   Occupation: [employer]    Employer: [employer]   Occupation: [employer]  Tobacco Use   Smoking status: Former  Current packs/day: 0.00    Types: Cigarettes    Quit date: 04/02/2023    Years since quitting: 1.2   Smokeless tobacco: Never  Vaping Use   Vaping status: Never Used  Substance and Sexual Activity   Alcohol  use: Not Currently    Comment: 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 [employer]    heavy drinking of Canadian mist alcohol  in  past   No drug use   Social Drivers of Health   Tobacco Use: Medium Risk (06/09/2024)   Patient History    Smoking Tobacco Use: Former    Smokeless Tobacco Use: Never    Passive Exposure: Not on Actuary Strain: Not on file  Food Insecurity: No Food Insecurity (04/22/2024)   Epic    Worried About Programme Researcher, Broadcasting/film/video in the Last Year: Never true    Ran Out of Food in the Last Year: Never true  Transportation Needs: No Transportation Needs (04/22/2024)   Epic    Lack of Transportation (Medical): No    Lack of Transportation (Non-Medical): No  Physical Activity: Not on file  Stress: Not on file  Social Connections: Not on file  Depression (PHQ2-9): Low Risk (06/02/2023)   Depression (PHQ2-9)    PHQ-2 Score: 0  Recent Concern: Depression (PHQ2-9) - Medium Risk (05/12/2023)   Depression (PHQ2-9)    PHQ-2 Score: 8  Alcohol  Screen: Not on file  Housing: Low Risk (04/22/2024)   Epic    Unable to Pay for Housing in the Last Year: No    Number of Times Moved in the Last Year: 0    Homeless in the Last Year: No  Utilities: Not At Risk (04/22/2024)   Epic    Threatened with loss of utilities: No  Health Literacy: Not on file   Family History  Problem Relation Age of Onset   Hypertension Other    Diabetes Neg Hx    Heart disease Neg Hx    Colon cancer Neg Hx    Stomach cancer Neg Hx    Pancreatic disease Neg Hx    Colon polyps Neg Hx    Esophageal cancer Neg Hx    Rectal cancer Neg Hx    Allergies[1] Prior to Admission medications  Medication Sig Start Date End Date Taking? Authorizing Provider  aspirin  EC 81 MG tablet Take 1 tablet (81 mg total) by mouth daily. Swallow whole. 03/27/21  Yes Darron Deatrice LABOR, MD  clopidogrel  (PLAVIX ) 75 MG tablet TAKE 1 TABLET(75 MG) BY MOUTH DAILY 04/21/24  Yes Darron Deatrice LABOR, MD  cyclobenzaprine  (FLEXERIL ) 10 MG tablet Take 1 tablet (10 mg total) by mouth 3 (three) times daily as needed for muscle spasms. 04/28/24  Yes  Jillian Buttery, MD  HYDROcodone -acetaminophen  (NORCO/VICODIN) 5-325 MG tablet Take 1 tablet by mouth every 6 (six) hours as needed (pain).   Yes [provider]  insulin  glargine (LANTUS  SOLOSTAR) 100 UNIT/ML Solostar Pen Inject 40 Units into the skin daily. 04/28/24  Yes Jillian Buttery, MD  REPATHA  SURECLICK 140 MG/ML SOAJ ADMINISTER 1 ML UNDER THE SKIN EVERY 14 DAYS 10/03/23  Yes Darron Deatrice LABOR, MD  spironolactone  (ALDACTONE ) 50 MG tablet TAKE 1 TABLET(50 MG) BY MOUTH DAILY 05/18/24  Yes Tower, Laine LABOR, MD  tadalafil  (CIALIS ) 20 MG tablet Take 1 tablet (20 mg total) by mouth daily as needed. Patient taking differently: Take 20 mg by mouth daily as needed for erectile dysfunction. 07/10/23  Yes   thiamine  (VITAMIN B-1) 100 MG tablet Take 100 mg by mouth in the morning.   Yes [provider]  clotrimazole -betamethasone  (LOTRISONE ) cream Apply 1 Application topically daily. 06/23/23   Janit Thresa HERO, DPM  Continuous Glucose Sensor (DEXCOM G7 SENSOR) MISC 1 Device by Does not apply route continuous. 04/29/23   Motwani, Komal, MD  docusate sodium  (COLACE) 100 MG capsule Take 1 capsule (100 mg total) by mouth 2 (two) times daily. 04/28/24   Adhikari, Amrit, MD  glucose blood (ONETOUCH VERIO) test strip 1 each by Other route daily. And lancets 1/day 01/26/21   Kassie Mallick, MD  insulin  aspart (NOVOLOG ) 100 UNIT/ML FlexPen Inject 0-5 Units into the skin at bedtime. 05/01/24   Rosario Leatrice FERNS, MD  Insulin  Pen Needle (PEN NEEDLES) 32G X 4 MM MISC 1 Device by Does not apply route in the morning, at noon, in the evening, and at bedtime. 12/16/23   Motwani, Komal, MD  Insulin  Pen Needle 31G X 8 MM MISC Inject 1 Needle into the skin daily at 6 (six) AM. 05/09/23   Motwani, Obadiah, MD  Lancets (ONETOUCH DELICA PLUS LANCET33G) MISC USE AS DIRECTED IN THE MORNING, AT NOON, AND AT BEDTIME 02/17/24   Motwani, Komal, MD  lidocaine  (LIDODERM ) 5 % Place 1 patch onto the skin daily. Remove & Discard  patch within 12 hours or as directed by MD 05/02/24   Rosario Leatrice FERNS, MD  polyethylene glycol (MIRALAX  / GLYCOLAX ) 17 g packet Take 17 g by mouth daily as needed for mild constipation. 04/28/24   Jillian Buttery, MD  Semaglutide , 2 MG/DOSE, 8 MG/3ML SOPN Inject 2 mg as directed once a week. 06/07/24   Motwani, Komal, MD    ROS: All other systems have been reviewed and were otherwise negative with the exception of those mentioned in the HPI and as above.  Physical Exam: General: Alert, no acute distress Cardiovascular: No pedal edema Respiratory: No cyanosis, no use of accessory musculature GI: No organomegaly, abdomen is soft and non-tender Skin: No lesions in the area of chief complaint Neurologic: Sensation intact distally Psychiatric: Patient is competent for consent with normal mood and affect Lymphatic: No axillary or cervical lymphadenopathy  MUSCULOSKELETAL:   ROM not tested in setting of unstable shoulder with recent dislocation. He endorses axillary nerve sensation. Able to appreciate deltoid motor function. Distal motor and sensory function intact. Warm well perfused hand.  Imaging: MRI of the right shoulder demonstrates a bony Bankart injury. Large Hill-Sachs deformity. Full thickness tear of the supraspinatus tendon with 1.6 cm retraction and moderate atrophy of the supraspinatus, infraspinatus, and teres minor muscles. Full thickness partial width tear of the infraspinatus tendon superiorly. Large glenohumeral joint effusion. Moderate degenerative glenohumeral chondral thinning.  BMI: Estimated body mass index is 33.41 kg/m as calculated from the following:   Height as of 06/07/24: 5' 6 (1.676 m).   Weight as of 06/07/24: 93.9 kg.  Lab Results  Component Value Date   ALBUMIN 4.0 04/22/2024   Diabetes:   Patient has a diagnosis of diabetes,  Lab Results  Component Value Date   HGBA1C 8.1 (A) 06/07/2024   Smoking Status:       Assessment: Closed fracture of  right shoulder girdle  Plan: Plan for Procedures: ARTHROPLASTY, SHOULDER, TOTAL, REVERSE  The risks  benefits and alternatives were discussed with the patient including but not limited to the risks of nonoperative treatment, versus surgical intervention including infection, bleeding, nerve injury,  blood clots, cardiopulmonary complications, morbidity, mortality, among others, and they were willing to proceed.   We additionally specifically discussed risks of axillary nerve injury, infection, periprosthetic fracture, continued pain and longevity of implants prior to beginning procedure.    Patient will be closely monitored in PACU for medical stabilization and pain control. Patient will be admitted for observation after surgery to allow for early mobilization after surgery. The patient is planning to be discharged home following day with outpatient PT.   The patient acknowledged the explanation, agreed to proceed with the plan and consent was signed.   Operative Plan: R RTSA Discharge Medications: standard DVT Prophylaxis: aspirin  Physical Therapy: outpatient Special Discharge needs: Sling. IceMan   Aleck LOISE Stalling, PA-C  06/15/2024 8:40 AM     [1] No Known Allergies  "

## 2024-06-16 ENCOUNTER — Encounter (HOSPITAL_COMMUNITY): Payer: Self-pay | Admitting: Orthopaedic Surgery

## 2024-06-16 ENCOUNTER — Ambulatory Visit (HOSPITAL_COMMUNITY)

## 2024-06-16 ENCOUNTER — Ambulatory Visit (HOSPITAL_COMMUNITY): Payer: Self-pay | Admitting: Medical

## 2024-06-16 ENCOUNTER — Encounter (HOSPITAL_COMMUNITY)
Admission: RE | Disposition: A | Discharge status: Home / Self Care | Payer: Self-pay | Source: Home / Self Care | Attending: Orthopaedic Surgery

## 2024-06-16 ENCOUNTER — Ambulatory Visit (HOSPITAL_COMMUNITY): Admitting: Anesthesiology

## 2024-06-16 ENCOUNTER — Ambulatory Visit (HOSPITAL_COMMUNITY)
Admission: RE | Admit: 2024-06-16 | Discharge: 2024-06-16 | Disposition: A | Attending: Orthopaedic Surgery | Admitting: Orthopaedic Surgery

## 2024-06-16 ENCOUNTER — Other Ambulatory Visit: Payer: Self-pay

## 2024-06-16 DIAGNOSIS — I10 Essential (primary) hypertension: Secondary | ICD-10-CM | POA: Insufficient documentation

## 2024-06-16 DIAGNOSIS — Z7985 Long-term (current) use of injectable non-insulin antidiabetic drugs: Secondary | ICD-10-CM | POA: Insufficient documentation

## 2024-06-16 DIAGNOSIS — S42201A Unspecified fracture of upper end of right humerus, initial encounter for closed fracture: Secondary | ICD-10-CM | POA: Diagnosis not present

## 2024-06-16 DIAGNOSIS — X58XXXA Exposure to other specified factors, initial encounter: Secondary | ICD-10-CM | POA: Insufficient documentation

## 2024-06-16 DIAGNOSIS — S43004A Unspecified dislocation of right shoulder joint, initial encounter: Secondary | ICD-10-CM | POA: Insufficient documentation

## 2024-06-16 DIAGNOSIS — Z794 Long term (current) use of insulin: Secondary | ICD-10-CM | POA: Insufficient documentation

## 2024-06-16 DIAGNOSIS — M25311 Other instability, right shoulder: Secondary | ICD-10-CM | POA: Insufficient documentation

## 2024-06-16 DIAGNOSIS — S4291XA Fracture of right shoulder girdle, part unspecified, initial encounter for closed fracture: Secondary | ICD-10-CM | POA: Diagnosis present

## 2024-06-16 DIAGNOSIS — S46011A Strain of muscle(s) and tendon(s) of the rotator cuff of right shoulder, initial encounter: Secondary | ICD-10-CM | POA: Insufficient documentation

## 2024-06-16 DIAGNOSIS — Z87891 Personal history of nicotine dependence: Secondary | ICD-10-CM | POA: Insufficient documentation

## 2024-06-16 DIAGNOSIS — E1165 Type 2 diabetes mellitus with hyperglycemia: Secondary | ICD-10-CM | POA: Diagnosis not present

## 2024-06-16 DIAGNOSIS — E1151 Type 2 diabetes mellitus with diabetic peripheral angiopathy without gangrene: Secondary | ICD-10-CM | POA: Insufficient documentation

## 2024-06-16 DIAGNOSIS — M1990 Unspecified osteoarthritis, unspecified site: Secondary | ICD-10-CM | POA: Insufficient documentation

## 2024-06-16 DIAGNOSIS — E1169 Type 2 diabetes mellitus with other specified complication: Secondary | ICD-10-CM

## 2024-06-16 LAB — POCT I-STAT, CHEM 8
BUN: 13 mg/dL (ref 8–23)
Calcium, Ion: 1.2 mmol/L (ref 1.15–1.40)
Chloride: 103 mmol/L (ref 98–111)
Creatinine, Ser: 0.7 mg/dL (ref 0.61–1.24)
Glucose, Bld: 194 mg/dL — ABNORMAL HIGH (ref 70–99)
HCT: 44 % (ref 39.0–52.0)
Hemoglobin: 15 g/dL (ref 13.0–17.0)
Potassium: 4.7 mmol/L (ref 3.5–5.1)
Sodium: 139 mmol/L (ref 135–145)
TCO2: 26 mmol/L (ref 22–32)

## 2024-06-16 LAB — GLUCOSE, CAPILLARY
Glucose-Capillary: 183 mg/dL — ABNORMAL HIGH (ref 70–99)
Glucose-Capillary: 99 mg/dL (ref 70–99)

## 2024-06-16 MED ORDER — ACETAMINOPHEN 500 MG PO TABS: 1000.0000 mg | ORAL_TABLET | Freq: Three times a day (TID) | ORAL | 0 refills | Status: AC

## 2024-06-16 MED ORDER — STERILE WATER FOR IRRIGATION IR SOLN
Status: DC | PRN
  Administered 2024-06-16 (×2): 1000 mL

## 2024-06-16 MED ORDER — LIDOCAINE HCL (CARDIAC) PF 100 MG/5ML IV SOSY
PREFILLED_SYRINGE | INTRAVENOUS | Status: DC | PRN
  Administered 2024-06-16: 60 mg via INTRAVENOUS

## 2024-06-16 MED ORDER — ACETAMINOPHEN 500 MG PO TABS
1000.0000 mg | ORAL_TABLET | Freq: Once | ORAL | Status: AC
  Administered 2024-06-16: 1000 mg via ORAL
  Filled 2024-06-16: qty 2

## 2024-06-16 MED ORDER — VANCOMYCIN HCL 1 G IV SOLR
INTRAVENOUS | Status: DC | PRN
  Administered 2024-06-16: 1000 mg

## 2024-06-16 MED ORDER — INSULIN ASPART 100 UNIT/ML IJ SOLN
0.0000 [IU] | INTRAMUSCULAR | Status: DC | PRN
  Administered 2024-06-16: 4 [IU] via SUBCUTANEOUS
  Filled 2024-06-16: qty 4

## 2024-06-16 MED ORDER — 0.9 % SODIUM CHLORIDE (POUR BTL) OPTIME
TOPICAL | Status: DC | PRN
  Administered 2024-06-16: 1000 mL

## 2024-06-16 MED ORDER — LACTATED RINGERS IV SOLN: INTRAVENOUS | Status: DC | PRN

## 2024-06-16 MED ORDER — DEXAMETHASONE SOD PHOSPHATE PF 10 MG/ML IJ SOLN
INTRAMUSCULAR | Status: DC | PRN
  Administered 2024-06-16: 4 mg via INTRAVENOUS

## 2024-06-16 MED ORDER — OXYCODONE HCL 5 MG/5ML PO SOLN: 5.0000 mg | Freq: Once | ORAL | Status: DC | PRN

## 2024-06-16 MED ORDER — PROPOFOL 10 MG/ML IV BOLUS
INTRAVENOUS | Status: AC
  Filled 2024-06-16: qty 20

## 2024-06-16 MED ORDER — PHENYLEPHRINE 80 MCG/ML (10ML) SYRINGE FOR IV PUSH (FOR BLOOD PRESSURE SUPPORT)
PREFILLED_SYRINGE | INTRAVENOUS | Status: DC | PRN
  Administered 2024-06-16 (×3): 160 ug via INTRAVENOUS

## 2024-06-16 MED ORDER — ORAL CARE MOUTH RINSE: 15.0000 mL | Freq: Once | OROMUCOSAL | Status: AC

## 2024-06-16 MED ORDER — PROPOFOL 10 MG/ML IV BOLUS
INTRAVENOUS | Status: DC | PRN
  Administered 2024-06-16: 50 mg via INTRAVENOUS
  Administered 2024-06-16: 100 mg via INTRAVENOUS

## 2024-06-16 MED ORDER — LACTATED RINGERS IV SOLN: INTRAVENOUS | Status: DC

## 2024-06-16 MED ORDER — FENTANYL CITRATE (PF) 50 MCG/ML IJ SOSY: 25.0000 ug | PREFILLED_SYRINGE | INTRAMUSCULAR | Status: DC | PRN

## 2024-06-16 MED ORDER — SUGAMMADEX SODIUM 200 MG/2ML IV SOLN
INTRAVENOUS | Status: DC | PRN
  Administered 2024-06-16: 200 mg via INTRAVENOUS

## 2024-06-16 MED ORDER — CHLORHEXIDINE GLUCONATE 0.12 % MT SOLN
15.0000 mL | Freq: Once | OROMUCOSAL | Status: AC
  Administered 2024-06-16: 15 mL via OROMUCOSAL

## 2024-06-16 MED ORDER — TRANEXAMIC ACID-NACL 1000-0.7 MG/100ML-% IV SOLN
1000.0000 mg | INTRAVENOUS | Status: AC
  Administered 2024-06-16: 1000 mg via INTRAVENOUS
  Filled 2024-06-16: qty 100

## 2024-06-16 MED ORDER — GABAPENTIN 100 MG PO CAPS: 100.0000 mg | ORAL_CAPSULE | Freq: Three times a day (TID) | ORAL | 0 refills | Status: AC

## 2024-06-16 MED ORDER — BUPIVACAINE HCL (PF) 0.5 % IJ SOLN
INTRAMUSCULAR | Status: DC | PRN
  Administered 2024-06-16: 10 mL via PERINEURAL

## 2024-06-16 MED ORDER — ONDANSETRON 4 MG PO TBDP: 4.0000 mg | ORAL_TABLET | Freq: Three times a day (TID) | ORAL | 0 refills | Status: AC | PRN

## 2024-06-16 MED ORDER — PHENYLEPHRINE HCL-NACL 20-0.9 MG/250ML-% IV SOLN
INTRAVENOUS | Status: DC | PRN
  Administered 2024-06-16: 50 ug/min via INTRAVENOUS

## 2024-06-16 MED ORDER — OXYCODONE HCL 5 MG PO TABS: ORAL_TABLET | ORAL | 0 refills | Status: AC

## 2024-06-16 MED ORDER — ONDANSETRON HCL 4 MG/2ML IJ SOLN
INTRAMUSCULAR | Status: DC | PRN
  Administered 2024-06-16: 4 mg via INTRAVENOUS

## 2024-06-16 MED ORDER — VANCOMYCIN HCL 1000 MG IV SOLR
INTRAVENOUS | Status: AC
  Filled 2024-06-16: qty 20

## 2024-06-16 MED ORDER — OXYCODONE HCL 5 MG PO TABS: 5.0000 mg | ORAL_TABLET | Freq: Once | ORAL | Status: DC | PRN

## 2024-06-16 MED ORDER — CEFAZOLIN SODIUM-DEXTROSE 2-4 GM/100ML-% IV SOLN
2.0000 g | INTRAVENOUS | Status: AC
  Administered 2024-06-16: 2 g via INTRAVENOUS
  Filled 2024-06-16: qty 100

## 2024-06-16 MED ORDER — MIDAZOLAM HCL (PF) 2 MG/2ML IJ SOLN: 1.0000 mg | INTRAMUSCULAR | Status: DC

## 2024-06-16 MED ORDER — BUPIVACAINE LIPOSOME 1.3 % IJ SUSP
INTRAMUSCULAR | Status: DC | PRN
  Administered 2024-06-16: 10 mL via PERINEURAL

## 2024-06-16 MED ORDER — FENTANYL CITRATE (PF) 100 MCG/2ML IJ SOLN
INTRAMUSCULAR | Status: DC | PRN
  Administered 2024-06-16: 100 ug via INTRAVENOUS

## 2024-06-16 MED ORDER — ROCURONIUM BROMIDE 100 MG/10ML IV SOLN
INTRAVENOUS | Status: DC | PRN
  Administered 2024-06-16: 50 mg via INTRAVENOUS

## 2024-06-16 MED ORDER — DROPERIDOL 2.5 MG/ML IJ SOLN: 0.6250 mg | Freq: Once | INTRAMUSCULAR | Status: DC | PRN

## 2024-06-16 MED ORDER — FENTANYL CITRATE (PF) 50 MCG/ML IJ SOSY
50.0000 ug | PREFILLED_SYRINGE | INTRAMUSCULAR | Status: DC
  Administered 2024-06-16: 50 ug via INTRAVENOUS
  Filled 2024-06-16: qty 2

## 2024-06-16 MED ORDER — FENTANYL CITRATE (PF) 100 MCG/2ML IJ SOLN
INTRAMUSCULAR | Status: AC
  Filled 2024-06-16: qty 2

## 2024-06-16 NOTE — Transfer of Care (Signed)
 Immediate Anesthesia Transfer of Care Note  Patient: Zachary Lewis  Procedure(s) Performed: ARTHROPLASTY, SHOULDER, TOTAL, REVERSE (Right: Shoulder)  Patient Location: PACU  Anesthesia Type:General and Regional  Level of Consciousness: awake and alert   Airway & Oxygen Therapy: Patient Spontanous Breathing and Patient connected to face mask oxygen  Post-op Assessment: Report given to RN and Post -op Vital signs reviewed and stable  Post vital signs: Reviewed and stable  Last Vitals:  Vitals Value Taken Time  BP    Temp    Pulse    Resp    SpO2      Last Pain:  Vitals:   06/16/24 1120  TempSrc:   PainSc: 0-No pain         Complications: No notable events documented.

## 2024-06-16 NOTE — Discharge Instructions (Signed)
 Bonner Hair MD, MPH Aleck Stalling, PA-C [facility] [address], [address] [phone] (tel)   [phone] (fax)   POST-OPERATIVE INSTRUCTIONS - TOTAL SHOULDER REPLACEMENT    WOUND CARE You may leave the operative dressing in place until your follow-up appointment. KEEP THE INCISIONS CLEAN AND DRY. There may be a small amount of fluid/bleeding leaking at the surgical site. This is normal after surgery.  If it fills with liquid or blood please call us  immediately to change it for you. Use the provided ice machine or Ice packs as often as possible for the first 3-4 days, then as needed for pain relief.   Keep a layer of cloth or a shirt between your skin and the cooling unit to prevent frost bite as it can get very cold.  SHOWERING: - You may shower on Post-Op Day #2.  - The dressing is water  resistant but do not scrub it as it may start to peel up.   - You may remove the sling for showering - Gently pat the area dry.  - Do not soak the shoulder in water .  - Do not go swimming in the pool or ocean until your incision has completely healed (about 4-6 weeks after surgery) - KEEP THE INCISIONS CLEAN AND DRY.  EXERCISES Wear the sling at all times  You may remove the sling for showering, but keep the arm across the chest or in a secondary sling.    Accidental/Purposeful External Rotation and shoulder flexion (reaching behind you) is to be avoided at all costs for the first month. It is ok to come out of your sling if your are sitting and have assistance for eating.   Do not lift anything heavier than 1 pound until we discuss it further in clinic.  It is normal for your fingers/hand to become more swollen after surgery and discolored from bruising.   This will resolve over the first few weeks usually after surgery. Please continue to ambulate and do not stay sitting or lying for too long.  Perform foot and wrist pumps to assist in circulation.  PHYSICAL  THERAPY - You will begin physical therapy soon after surgery (unless otherwise specified) - Please call to set up an appointment, if you do not already have one  - Let our office if there are any issues with scheduling your therapy    REGIONAL ANESTHESIA (NERVE BLOCKS) The anesthesia team may have performed a nerve block for you this is a great tool used to minimize pain.   The block may start wearing off overnight (between 8-24 hours postop) When the block wears off, your pain may go from nearly zero to the pain you would have had postop without the block. This is an abrupt transition but nothing dangerous is happening.   This can be a challenging period but utilize your as needed pain medications to try and manage this period. We suggest you use the pain medication the first night prior to going to bed, to ease this transition.  You may take an extra dose of narcotic when this happens if needed   POST-OP MEDICATIONS- Multimodal approach to pain control In general your pain will be controlled with a combination of substances.  Prescriptions unless otherwise discussed are electronically sent to your pharmacy.  This is a carefully made plan we use to minimize narcotic use.     Gabapentin  - this is to help with nerve based pain, take on a scheduled basis Acetaminophen  - Non-narcotic  pain medicine taken on a scheduled basis  Oxycodone  - This is a strong narcotic, to be used only on an as needed basis for SEVERE pain. Zofran  -  take as needed for nausea  You may resume your plavix  and aspirin  24 hours after surgery  FOLLOW-UP If you develop a Fever (>101.5), Redness or Drainage from the surgical incision site, please call our office to arrange for an evaluation. Please call the office to schedule a follow-up appointment for a wound check, 7-10 days post-operatively.  IF YOU HAVE ANY QUESTIONS, PLEASE FEEL FREE TO CALL OUR OFFICE.  HELPFUL INFORMATION  Your arm will be in a sling  following surgery. You will be in this sling for the next 4 weeks.   You may be more comfortable sleeping in a semi-seated position the first few nights following surgery.  Keep a pillow propped under the elbow and forearm for comfort.  If you have a recliner type of chair it might be beneficial.  If not that is fine too, but it would be helpful to sleep propped up with pillows behind your operated shoulder as well under your elbow and forearm.  This will reduce pulling on the suture lines.  When dressing, put your operative arm in the sleeve first.  When getting undressed, take your operative arm out last.  Loose fitting, button-down shirts are recommended.  In most states it is against the law to drive while your arm is in a sling. And certainly against the law to drive while taking narcotics.  You may return to work/school in the next couple of days when you feel up to it. Desk work and typing in the sling is fine.  We suggest you use the pain medication the first night prior to going to bed, in order to ease any pain when the anesthesia wears off. You should avoid taking pain medications on an empty stomach as it will make you nauseous.  You should wean off your narcotic medicines as soon as you are able.     Most patients will be off narcotics before their first postop appointment.   Do not drink alcoholic beverages or take illicit drugs when taking pain medications.  Pain medication may make you constipated.  Below are a few solutions to try in this order: Decrease the amount of pain medication if you arent having pain. Drink lots of decaffeinated fluids. Drink prune juice and/or each dried prunes  If the first 3 dont work start with additional solutions Take Colace - an over-the-counter stool softener Take Senokot - an over-the-counter laxative Take Miralax  - a stronger over-the-counter laxative   Dental Antibiotics:  We require dental prophylaxis for 2 years after a shoulder  replacement  Contact your surgeon for an antibiotic prescription, prior to your dental procedure.   For more information including helpful videos and documents visit our website:   [email or web address]

## 2024-06-16 NOTE — Anesthesia Procedure Notes (Signed)
 Procedure Name: Intubation Date/Time: 06/16/2024 1:13 PM  Performed by: Deeann Eva BROCKS, CRNAPre-anesthesia Checklist: Patient identified, Emergency Drugs available, Suction available and Patient being monitored Patient Re-evaluated:Patient Re-evaluated prior to induction Oxygen Delivery Method: Circle System Utilized Preoxygenation: Pre-oxygenation with 100% oxygen Induction Type: IV induction Ventilation: Mask ventilation without difficulty Laryngoscope Size: Mac and 3 Grade View: Grade II Tube type: Oral Tube size: 8.0 mm Number of attempts: 1 Airway Equipment and Method: Stylet Placement Confirmation: ETT inserted through vocal cords under direct vision, positive ETCO2 and breath sounds checked- equal and bilateral Secured at: 22 cm Tube secured with: Tape Dental Injury: Teeth and Oropharynx as per pre-operative assessment

## 2024-06-16 NOTE — Interval H&P Note (Signed)
 All questions answered, patient wants to proceed with procedure. ? ?

## 2024-06-16 NOTE — Op Note (Signed)
 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  Patient had clear instability with a easily dislocatable shoulder.  He had a large Hill-Sachs encompassing seeing a large amount of the proximal humeral head.  The rotator cuff was torn as well.  Axillary nerve tug test was normal.  Good stability of the implant noted at the end of the case.  Post-operative plan: The patient will be NWB in sling until block wears off, okay to start using the arm for ADLs as well as walker use immediately.  The patient will be will be discharged from PACU if continues to be stable as was plan prior to surgery.  DVT prophylaxis Aspirin  81 mg twice daily for 6 weeks.  Pain control with PRN pain medication preferring oral medicines.  Follow up plan will be scheduled in approximately 7 days for incision check and XR.  Physical therapy to start after 4 weeks.  Implants: Tornier perform humeral size 3 long stem, +3 retentive polyethylene, 39+3 glenosphere, 25+3 baseplate with a 35 center screw and 2 peripheral screws  Post-Op Diagnosis: Same Surgeons:Primary: Cristy Bonner ONEIDA, MD Assistants:Caroline McBane, PA-C Location: [facility] ROOM 06 Anesthesia: General with Exparel  Interscalene Antibiotics: Ancef  2g preop, Vancomycin  1000mg  locally Tourniquet time: None Estimated Blood Loss: 100 Complications: None Specimens: None Implants: Implant Name Type Inv. Item Serial No. Manufacturer Lot No. LRB No. Used Action  SPHERE GLENOD +3X39XLATERALIZE - [id number] Shoulder SPHERE GLENOD +3X39XLATERALIZE [id number] [employer]  Right 1 Implanted  BASEPLATE GLENOID RSA 3X25 0D - [id number]  Shoulder BASEPLATE GLENOID RSA 3X25 0D [id number] [employer]  Right 1 Implanted  SCREW BONE THREAD 6.5X35 - [id number] Screw SCREW BONE THREAD 6.5X35  [employer]  Right 1 Implanted  SCREW PERIPHERAL 42 - [id number] Screw SCREW PERIPHERAL 42  [employer]  Right 1 Implanted  SCREW 5.5X22 - [id number] Screw SCREW 5.5X22  [employer]  Right 1 Implanted  CUP HUM REV SHLD 3/4 39 +3 - [id number] Cup CUP HUM REV SHLD 3/4 39 +3 [id number] [employer]  Right 1 Implanted  STEM HUMERAL PLUS LONG SZ3 - [id number] Orthopedic Implant STEM HUMERAL PLUS LONG SZ3 [id number] [employer]  Right 1 Implanted    Indications for Surgery:   Zachary Lewis is a 64 y.o. male with recurrent shoulder instability and proximal humerus fracture with cuff tear.  Benefits and risks of operative and nonoperative management were discussed prior to surgery with patient/guardian(s) and informed consent form was completed.  Infection and need for further surgery were discussed as was prosthetic stability and cuff issues.  We additionally specifically discussed risks of axillary nerve injury, infection, periprosthetic fracture, continued pain and longevity of implants prior to beginning procedure.      Procedure:   The patient was identified in the preoperative holding area where the surgical site was marked. Block placed by anesthesia with exparel .  The patient was taken to the OR where a procedural timeout was called and the above noted anesthesia was induced.  The patient was positioned beachchair on allen table with spider arm positioner.  Preoperative antibiotics were dosed.  The patient's right shoulder was prepped and draped in the usual sterile fashion.  A second preoperative timeout was called.  Standard deltopectoral approach was performed with a #10 blade. We dissected down to the subcutaneous tissues and the cephalic vein was taken laterally with the deltoid. Clavipectoral fascia was incised in line with the incision.  Deep retractors were placed. The long of the biceps tendon was identified and there was significant tenosynovitis present.  Tenodesis was performed to the pectoralis tendon with #2 Ethibond. The remaining biceps was followed up into the rotator interval where it was released.  It was clear that humeral head would sublux anterior and we reduced it open to continue with the case.  The subscapularis was taken down in a full thickness layer with capsule along the humeral neck extending inferiorly around the humeral head. We continued releasing the capsule directly off of the osteophytes inferiorly all the way around the corner. This allowed us  to dislocate the humeral head.   There were osteophytes along the inferior humeral neck. The osteophytes were removed with an osteotome and a rongeur.  Osteophytes were removed with a rongeur and an osteotome and the anatomic neck was well visualized.     A humeral cutting guide was used extra medullary with a pin to help control version. The version was set at 20 of retroversion. Humeral osteotomy was performed with an oscillating saw. The head fragment was passed off the back table.  A cut protector plate was placed.  The subscapularis was again identified and immediately we took care to palpate the axillary nerve anteriorly and verify its position with gentle palpation as well as the tug test.  We then released the SGHL with bovie cautery prior to placing a curved mayo at the junction of the anterior glenoid well above the axillary nerve and bluntly dissecting the subscapularis from the capsule.  We then carefully protected the axillary nerve as we gently released the inferior capsule to fully mobilize the subscapularis.  An anterior deltoid retractor was then placed as well as a small Hohmann retractor superiorly.  The remaining labrum was removed circumferentially taking great care not to disrupt the posterior capsule.   The glenoid drill guide was placed and  used to drill a guide pin in the center, inferior position. The glenoid face was then reamed concentrically over the guide wire. The center hole was drilled over the guidepin in a near anatomic angle of version. Next the glenoid vault was drilled back and the vault was intact.  We were able to place our baseplate and 2 peripheral locking screws.  The base plate was screwed into the glenoid vault obtaining secure fixation. We next placed superior and inferior locking screws for additional fixation.  Glenosphere was attached as above size list.  The center screw was tightened.  We turned attention back to the humeral side. The cut protector was removed.  We then reamed for the central ball of the implant.  We sized as above.  Trial was obtained good stability and we selected real implants.  The shoulder was trialed.  There was good ROM in all planes and the shoulder was stable with no inferior translation.  The real humeral implants were opened after again confirming sizes.  The trial was removed. #5 Fiberwire x4 sutures passed through the humeral neck for subscap repair. The humeral component was press-fit obtaining a secure fit. The joint was reduced and thoroughly irrigated with pulsatile lavage. Subscap was repaired back with #5 Fiberwire sutures through bone tunnels. Hemostasis was obtained. The deltopectoral interval was reapproximated with #1 Ethibond. The subcutaneous tissues were closed with  2-0 Vicryl and the skin was closed with running monocryl.    The wounds were cleaned and dried and an Aquacel dressing was placed. The drapes taken down. The arm was placed into sling with abduction pillow. Patient was awakened, extubated, and transferred to the recovery room in stable condition. There were no intraoperative complications. The sponge, needle, and attention counts were correct at the end of the case.   Aleck Stalling, PA-C, present and scrubbed throughout the case, critical for completion in a  timely fashion, and for retraction, instrumentation, closure.

## 2024-06-16 NOTE — Anesthesia Postprocedure Evaluation (Signed)
"   Anesthesia Post Note  Patient: Zachary Lewis  Procedure(s) Performed: ARTHROPLASTY, SHOULDER, TOTAL, REVERSE (Right: Shoulder)     Patient location during evaluation: PACU Anesthesia Type: Regional and General Level of consciousness: awake and alert Pain management: pain level controlled Vital Signs Assessment: post-procedure vital signs reviewed and stable Respiratory status: spontaneous breathing, nonlabored ventilation, respiratory function stable and patient connected to nasal cannula oxygen Cardiovascular status: blood pressure returned to baseline and stable Postop Assessment: no apparent nausea or vomiting Anesthetic complications: no   No notable events documented.  Last Vitals:  Vitals:   06/16/24 1524 06/16/24 1545  BP: 122/76 124/81  Pulse: 80 87  Resp: 20 18  Temp: (!) 36.3 C   SpO2: 100% 96%    Last Pain:  Vitals:   06/16/24 1545  TempSrc:   PainSc: 0-No pain                 Thom JONELLE Peoples      "

## 2024-06-16 NOTE — Anesthesia Procedure Notes (Signed)
 Anesthesia Regional Block: Interscalene brachial plexus block   Pre-Anesthetic Checklist: , timeout performed,  Correct Patient, Correct Site, Correct Laterality,  Correct Procedure, Correct Position, site marked,  Risks and benefits discussed,  Surgical consent,  Pre-op evaluation,  At surgeon's request and post-op pain management  Laterality: Right  Prep: chloraprep       Needles:  Injection technique: Single-shot  Needle Type: Echogenic Stimulator Needle     Needle Length: 9cm  Needle Gauge: 21     Additional Needles:   Procedures:,,,, ultrasound used (permanent image in chart),,    Narrative:  Start time: 06/16/2024 12:10 PM End time: 06/16/2024 12:15 PM Injection made incrementally with aspirations every 5 mL.  Performed by: Personally  Anesthesiologist: Erma Thom SAUNDERS, MD  Additional Notes: Discussed risks and benefits of the nerve block in detail, including but not limited vascular injury, permanent nerve damage and infection.   Patient tolerated the procedure well. Local anesthetic introduced in an incremental fashion under minimal resistance after negative aspirations. No paresthesias were elicited. After completion of the procedure, no acute issues were identified and patient continued to be monitored by RN.

## 2024-06-17 ENCOUNTER — Encounter (HOSPITAL_COMMUNITY): Payer: Self-pay | Admitting: Orthopaedic Surgery

## 2024-06-23 ENCOUNTER — Ambulatory Visit: Admitting: Podiatry

## 2024-07-21 ENCOUNTER — Ambulatory Visit (HOSPITAL_COMMUNITY)

## 2024-07-21 ENCOUNTER — Ambulatory Visit

## 2024-07-26 ENCOUNTER — Ambulatory Visit: Admitting: Podiatry

## 2024-09-06 ENCOUNTER — Ambulatory Visit: Admitting: "Endocrinology
